# Patient Record
Sex: Female | Born: 1935 | Race: White | Hispanic: No | Smoking: Current every day smoker
Health system: Southern US, Community
[De-identification: ages and names within clinical notes are randomized; demographics above are authoritative.]

## PROBLEM LIST (undated history)

## (undated) DIAGNOSIS — E785 Hyperlipidemia, unspecified: Secondary | ICD-10-CM

## (undated) DIAGNOSIS — F419 Anxiety disorder, unspecified: Secondary | ICD-10-CM

## (undated) DIAGNOSIS — R911 Solitary pulmonary nodule: Secondary | ICD-10-CM

## (undated) DIAGNOSIS — C3490 Malignant neoplasm of unspecified part of unspecified bronchus or lung: Secondary | ICD-10-CM

## (undated) DIAGNOSIS — I10 Essential (primary) hypertension: Secondary | ICD-10-CM

## (undated) DIAGNOSIS — I509 Heart failure, unspecified: Secondary | ICD-10-CM

## (undated) DIAGNOSIS — I714 Abdominal aortic aneurysm, without rupture, unspecified: Secondary | ICD-10-CM

## (undated) DIAGNOSIS — K219 Gastro-esophageal reflux disease without esophagitis: Secondary | ICD-10-CM

## (undated) DIAGNOSIS — E039 Hypothyroidism, unspecified: Secondary | ICD-10-CM

## (undated) DIAGNOSIS — C801 Malignant (primary) neoplasm, unspecified: Secondary | ICD-10-CM

## (undated) DIAGNOSIS — D649 Anemia, unspecified: Secondary | ICD-10-CM

## (undated) DIAGNOSIS — F329 Major depressive disorder, single episode, unspecified: Secondary | ICD-10-CM

## (undated) DIAGNOSIS — I2510 Atherosclerotic heart disease of native coronary artery without angina pectoris: Secondary | ICD-10-CM

## (undated) DIAGNOSIS — K224 Dyskinesia of esophagus: Secondary | ICD-10-CM

## (undated) DIAGNOSIS — Z90710 Acquired absence of both cervix and uterus: Secondary | ICD-10-CM

## (undated) DIAGNOSIS — J449 Chronic obstructive pulmonary disease, unspecified: Secondary | ICD-10-CM

## (undated) DIAGNOSIS — I219 Acute myocardial infarction, unspecified: Secondary | ICD-10-CM

## (undated) DIAGNOSIS — F32A Depression, unspecified: Secondary | ICD-10-CM

## (undated) DIAGNOSIS — Z720 Tobacco use: Secondary | ICD-10-CM

## (undated) HISTORY — DX: Abdominal aortic aneurysm, without rupture, unspecified: I71.40

## (undated) HISTORY — DX: Anxiety disorder, unspecified: F41.9

## (undated) HISTORY — DX: Dyskinesia of esophagus: K22.4

## (undated) HISTORY — PX: CARDIAC CATHETERIZATION: SHX172

## (undated) HISTORY — PX: OTHER SURGICAL HISTORY: SHX169

## (undated) HISTORY — DX: Abdominal aortic aneurysm, without rupture: I71.4

## (undated) HISTORY — DX: Hyperlipidemia, unspecified: E78.5

## (undated) HISTORY — PX: BLADDER SURGERY: SHX569

## (undated) HISTORY — DX: Gastro-esophageal reflux disease without esophagitis: K21.9

## (undated) HISTORY — DX: Major depressive disorder, single episode, unspecified: F32.9

## (undated) HISTORY — DX: Acquired absence of both cervix and uterus: Z90.710

## (undated) HISTORY — DX: Tobacco use: Z72.0

## (undated) HISTORY — DX: Solitary pulmonary nodule: R91.1

## (undated) HISTORY — DX: Essential (primary) hypertension: I10

## (undated) HISTORY — DX: Atherosclerotic heart disease of native coronary artery without angina pectoris: I25.10

## (undated) HISTORY — PX: KIDNEY STONE SURGERY: SHX686

## (undated) HISTORY — PX: ABDOMINAL HYSTERECTOMY: SHX81

## (undated) HISTORY — PX: CHOLECYSTECTOMY: SHX55

## (undated) HISTORY — DX: Chronic obstructive pulmonary disease, unspecified: J44.9

## (undated) HISTORY — DX: Depression, unspecified: F32.A

## (undated) HISTORY — PX: NECK SURGERY: SHX720

## (undated) HISTORY — DX: Anemia, unspecified: D64.9

## (undated) HISTORY — DX: Hypothyroidism, unspecified: E03.9

---

## 2002-11-15 HISTORY — PX: CORONARY STENT PLACEMENT: SHX1402

## 2006-11-15 HISTORY — PX: CORONARY STENT PLACEMENT: SHX1402

## 2007-03-16 HISTORY — PX: ESOPHAGOGASTRODUODENOSCOPY: SHX1529

## 2007-03-22 ENCOUNTER — Ambulatory Visit: Payer: Self-pay | Admitting: Gastroenterology

## 2007-03-22 ENCOUNTER — Inpatient Hospital Stay (HOSPITAL_COMMUNITY): Admission: AD | Admit: 2007-03-22 | Discharge: 2007-03-24 | Payer: Self-pay | Admitting: Internal Medicine

## 2007-03-23 ENCOUNTER — Encounter (INDEPENDENT_AMBULATORY_CARE_PROVIDER_SITE_OTHER): Payer: Self-pay | Admitting: Specialist

## 2007-03-23 ENCOUNTER — Ambulatory Visit: Payer: Self-pay | Admitting: Gastroenterology

## 2007-03-24 ENCOUNTER — Ambulatory Visit: Payer: Self-pay | Admitting: Gastroenterology

## 2007-04-06 ENCOUNTER — Ambulatory Visit: Payer: Self-pay | Admitting: Gastroenterology

## 2007-04-06 ENCOUNTER — Ambulatory Visit (HOSPITAL_COMMUNITY): Admission: RE | Admit: 2007-04-06 | Discharge: 2007-04-06 | Payer: Self-pay | Admitting: Gastroenterology

## 2007-04-06 ENCOUNTER — Encounter: Payer: Self-pay | Admitting: Gastroenterology

## 2007-04-28 ENCOUNTER — Ambulatory Visit (HOSPITAL_COMMUNITY): Admission: RE | Admit: 2007-04-28 | Discharge: 2007-04-28 | Payer: Self-pay | Admitting: Urology

## 2007-06-16 ENCOUNTER — Ambulatory Visit (HOSPITAL_COMMUNITY): Admission: RE | Admit: 2007-06-16 | Discharge: 2007-06-16 | Payer: Self-pay | Admitting: Internal Medicine

## 2007-06-23 ENCOUNTER — Ambulatory Visit: Payer: Self-pay | Admitting: Cardiology

## 2007-06-23 ENCOUNTER — Inpatient Hospital Stay (HOSPITAL_COMMUNITY): Admission: AD | Admit: 2007-06-23 | Discharge: 2007-06-27 | Payer: Self-pay | Admitting: Cardiology

## 2007-06-29 ENCOUNTER — Ambulatory Visit: Payer: Self-pay | Admitting: Cardiology

## 2007-06-29 ENCOUNTER — Inpatient Hospital Stay (HOSPITAL_COMMUNITY): Admission: EM | Admit: 2007-06-29 | Discharge: 2007-07-02 | Payer: Self-pay | Admitting: Emergency Medicine

## 2007-07-07 ENCOUNTER — Ambulatory Visit: Payer: Self-pay | Admitting: Cardiology

## 2007-07-07 LAB — CONVERTED CEMR LAB
BUN: 26 mg/dL — ABNORMAL HIGH (ref 6–23)
Creatinine, Ser: 1.1 mg/dL (ref 0.4–1.2)
GFR calc non Af Amer: 51 mL/min
Sodium: 139 meq/L (ref 135–145)

## 2007-07-18 ENCOUNTER — Encounter (HOSPITAL_COMMUNITY): Admission: RE | Admit: 2007-07-18 | Discharge: 2007-08-15 | Payer: Self-pay | Admitting: Cardiology

## 2007-07-30 ENCOUNTER — Ambulatory Visit: Payer: Self-pay | Admitting: Pulmonary Disease

## 2007-07-30 ENCOUNTER — Inpatient Hospital Stay (HOSPITAL_COMMUNITY): Admission: EM | Admit: 2007-07-30 | Discharge: 2007-07-31 | Payer: Self-pay | Admitting: Emergency Medicine

## 2007-07-30 ENCOUNTER — Ambulatory Visit: Payer: Self-pay | Admitting: Cardiology

## 2007-08-04 ENCOUNTER — Ambulatory Visit: Payer: Self-pay | Admitting: Pulmonary Disease

## 2007-08-11 ENCOUNTER — Ambulatory Visit (HOSPITAL_COMMUNITY): Admission: RE | Admit: 2007-08-11 | Discharge: 2007-08-11 | Payer: Self-pay | Admitting: Pulmonary Disease

## 2007-08-16 ENCOUNTER — Encounter (HOSPITAL_COMMUNITY): Admission: RE | Admit: 2007-08-16 | Discharge: 2007-09-15 | Payer: Self-pay | Admitting: Cardiology

## 2007-08-18 ENCOUNTER — Ambulatory Visit: Payer: Self-pay | Admitting: Pulmonary Disease

## 2007-09-06 ENCOUNTER — Ambulatory Visit: Payer: Self-pay | Admitting: Cardiology

## 2007-09-12 ENCOUNTER — Emergency Department (HOSPITAL_COMMUNITY): Admission: EM | Admit: 2007-09-12 | Discharge: 2007-09-13 | Payer: Self-pay | Admitting: Emergency Medicine

## 2007-10-10 ENCOUNTER — Emergency Department (HOSPITAL_COMMUNITY): Admission: EM | Admit: 2007-10-10 | Discharge: 2007-10-10 | Payer: Self-pay | Admitting: Emergency Medicine

## 2007-11-16 HISTORY — PX: OTHER SURGICAL HISTORY: SHX169

## 2007-11-22 ENCOUNTER — Emergency Department (HOSPITAL_COMMUNITY): Admission: EM | Admit: 2007-11-22 | Discharge: 2007-11-22 | Payer: Self-pay | Admitting: Emergency Medicine

## 2008-06-18 ENCOUNTER — Ambulatory Visit: Payer: Self-pay | Admitting: Gastroenterology

## 2008-07-03 ENCOUNTER — Ambulatory Visit: Payer: Self-pay | Admitting: Cardiology

## 2008-07-15 ENCOUNTER — Encounter (HOSPITAL_COMMUNITY): Admission: RE | Admit: 2008-07-15 | Discharge: 2008-08-12 | Payer: Self-pay | Admitting: Gastroenterology

## 2008-07-16 HISTORY — PX: OTHER SURGICAL HISTORY: SHX169

## 2008-07-24 ENCOUNTER — Encounter: Payer: Self-pay | Admitting: Gastroenterology

## 2008-07-24 ENCOUNTER — Ambulatory Visit: Payer: Self-pay | Admitting: Gastroenterology

## 2008-07-24 ENCOUNTER — Ambulatory Visit (HOSPITAL_COMMUNITY): Admission: RE | Admit: 2008-07-24 | Discharge: 2008-07-24 | Payer: Self-pay | Admitting: Gastroenterology

## 2008-09-04 ENCOUNTER — Ambulatory Visit: Payer: Self-pay | Admitting: Gastroenterology

## 2008-09-04 ENCOUNTER — Ambulatory Visit (HOSPITAL_COMMUNITY): Admission: RE | Admit: 2008-09-04 | Discharge: 2008-09-04 | Payer: Self-pay | Admitting: Gastroenterology

## 2009-01-18 ENCOUNTER — Ambulatory Visit (HOSPITAL_COMMUNITY): Admission: RE | Admit: 2009-01-18 | Discharge: 2009-01-18 | Payer: Self-pay | Admitting: Internal Medicine

## 2009-05-08 DIAGNOSIS — J449 Chronic obstructive pulmonary disease, unspecified: Secondary | ICD-10-CM

## 2009-05-08 DIAGNOSIS — E785 Hyperlipidemia, unspecified: Secondary | ICD-10-CM

## 2009-05-08 DIAGNOSIS — J4489 Other specified chronic obstructive pulmonary disease: Secondary | ICD-10-CM | POA: Insufficient documentation

## 2009-05-08 DIAGNOSIS — I10 Essential (primary) hypertension: Secondary | ICD-10-CM

## 2009-05-08 DIAGNOSIS — I2510 Atherosclerotic heart disease of native coronary artery without angina pectoris: Secondary | ICD-10-CM | POA: Insufficient documentation

## 2009-05-09 ENCOUNTER — Ambulatory Visit: Payer: Self-pay | Admitting: Cardiology

## 2009-05-14 ENCOUNTER — Telehealth (INDEPENDENT_AMBULATORY_CARE_PROVIDER_SITE_OTHER): Payer: Self-pay

## 2009-05-15 ENCOUNTER — Encounter: Payer: Self-pay | Admitting: Internal Medicine

## 2009-05-15 ENCOUNTER — Ambulatory Visit: Payer: Self-pay

## 2009-05-20 ENCOUNTER — Ambulatory Visit: Payer: Self-pay | Admitting: Cardiology

## 2009-05-20 LAB — CONVERTED CEMR LAB
Basophils Absolute: 0 10*3/uL (ref 0.0–0.1)
Calcium: 8.7 mg/dL (ref 8.4–10.5)
Chloride: 105 meq/L (ref 96–112)
Eosinophils Absolute: 0.1 10*3/uL (ref 0.0–0.7)
Eosinophils Relative: 1.9 % (ref 0.0–5.0)
GFR calc non Af Amer: 56.87 mL/min (ref >60)
Hemoglobin: 11.4 g/dL — ABNORMAL LOW (ref 12.0–15.0)
INR: 1 (ref 0.8–1.0)
Lymphs Abs: 1.7 10*3/uL (ref 0.7–4.0)
MCV: 89.7 fL (ref 78.0–100.0)
Monocytes Absolute: 0.6 10*3/uL (ref 0.1–1.0)
Neutrophils Relative %: 67.3 % (ref 43.0–77.0)
Platelets: 226 10*3/uL (ref 150.0–400.0)
Potassium: 4 meq/L (ref 3.5–5.1)
RBC: 3.76 M/uL — ABNORMAL LOW (ref 3.87–5.11)
RDW: 13.6 % (ref 11.5–14.6)
Sodium: 141 meq/L (ref 135–145)
WBC: 7.6 10*3/uL (ref 4.5–10.5)

## 2009-05-21 ENCOUNTER — Ambulatory Visit: Payer: Self-pay | Admitting: Cardiology

## 2009-05-21 ENCOUNTER — Inpatient Hospital Stay (HOSPITAL_BASED_OUTPATIENT_CLINIC_OR_DEPARTMENT_OTHER): Admission: RE | Admit: 2009-05-21 | Discharge: 2009-05-21 | Payer: Self-pay | Admitting: Cardiology

## 2009-06-01 DIAGNOSIS — R079 Chest pain, unspecified: Secondary | ICD-10-CM

## 2009-06-02 ENCOUNTER — Ambulatory Visit: Payer: Self-pay | Admitting: Cardiology

## 2009-06-02 LAB — CONVERTED CEMR LAB
Basophils Absolute: 0 10*3/uL (ref 0.0–0.1)
Basophils Relative: 0.5 % (ref 0.0–3.0)
CK-MB: 1.2 ng/mL (ref 0.3–4.0)
Eosinophils Relative: 1.7 % (ref 0.0–5.0)
Hemoglobin: 12 g/dL (ref 12.0–15.0)
Lipase: 11 units/L (ref 11.0–59.0)
Lymphs Abs: 2 10*3/uL (ref 0.7–4.0)
MCV: 91.7 fL (ref 78.0–100.0)
Monocytes Absolute: 0.6 10*3/uL (ref 0.1–1.0)
Neutro Abs: 6.2 10*3/uL (ref 1.4–7.7)
Neutrophils Relative %: 68.9 % (ref 43.0–77.0)
RBC: 3.91 M/uL (ref 3.87–5.11)
Total Bilirubin: 0.5 mg/dL (ref 0.3–1.2)
Total CK: 59 units/L (ref 7–177)
Total Protein: 7.1 g/dL (ref 6.0–8.3)
WBC: 9 10*3/uL (ref 4.5–10.5)

## 2009-06-12 ENCOUNTER — Telehealth (INDEPENDENT_AMBULATORY_CARE_PROVIDER_SITE_OTHER): Payer: Self-pay

## 2009-06-13 ENCOUNTER — Encounter: Payer: Self-pay | Admitting: Gastroenterology

## 2009-06-23 ENCOUNTER — Telehealth: Payer: Self-pay | Admitting: Cardiology

## 2009-06-24 ENCOUNTER — Telehealth: Payer: Self-pay | Admitting: Cardiology

## 2009-06-26 ENCOUNTER — Ambulatory Visit: Payer: Self-pay | Admitting: Gastroenterology

## 2009-06-26 DIAGNOSIS — K219 Gastro-esophageal reflux disease without esophagitis: Secondary | ICD-10-CM

## 2009-06-26 DIAGNOSIS — K648 Other hemorrhoids: Secondary | ICD-10-CM | POA: Insufficient documentation

## 2009-06-26 DIAGNOSIS — K3184 Gastroparesis: Secondary | ICD-10-CM

## 2009-06-30 DIAGNOSIS — K625 Hemorrhage of anus and rectum: Secondary | ICD-10-CM

## 2009-06-30 DIAGNOSIS — R197 Diarrhea, unspecified: Secondary | ICD-10-CM | POA: Insufficient documentation

## 2009-07-04 ENCOUNTER — Telehealth: Payer: Self-pay | Admitting: Cardiology

## 2009-07-05 IMAGING — CR DG CHEST 2V
2 series · 2 of 2 positions shown · non-contrast
Comparison: none

HISTORY: Dyspnea, cough, pleuritic left chest pain, COPD, smoker

CHEST 2 VIEWS:
Comparison 03/22/2007
Normal heart size and pulmonary vascularity.
Mildly tortuous aorta with atherosclerotic calcification at arch.
Changes of COPD and mild chronic bronchitis.
No infiltrate, pleural effusion, or mass.
Bones unremarkable.

[view not recorded (1 of 2)]
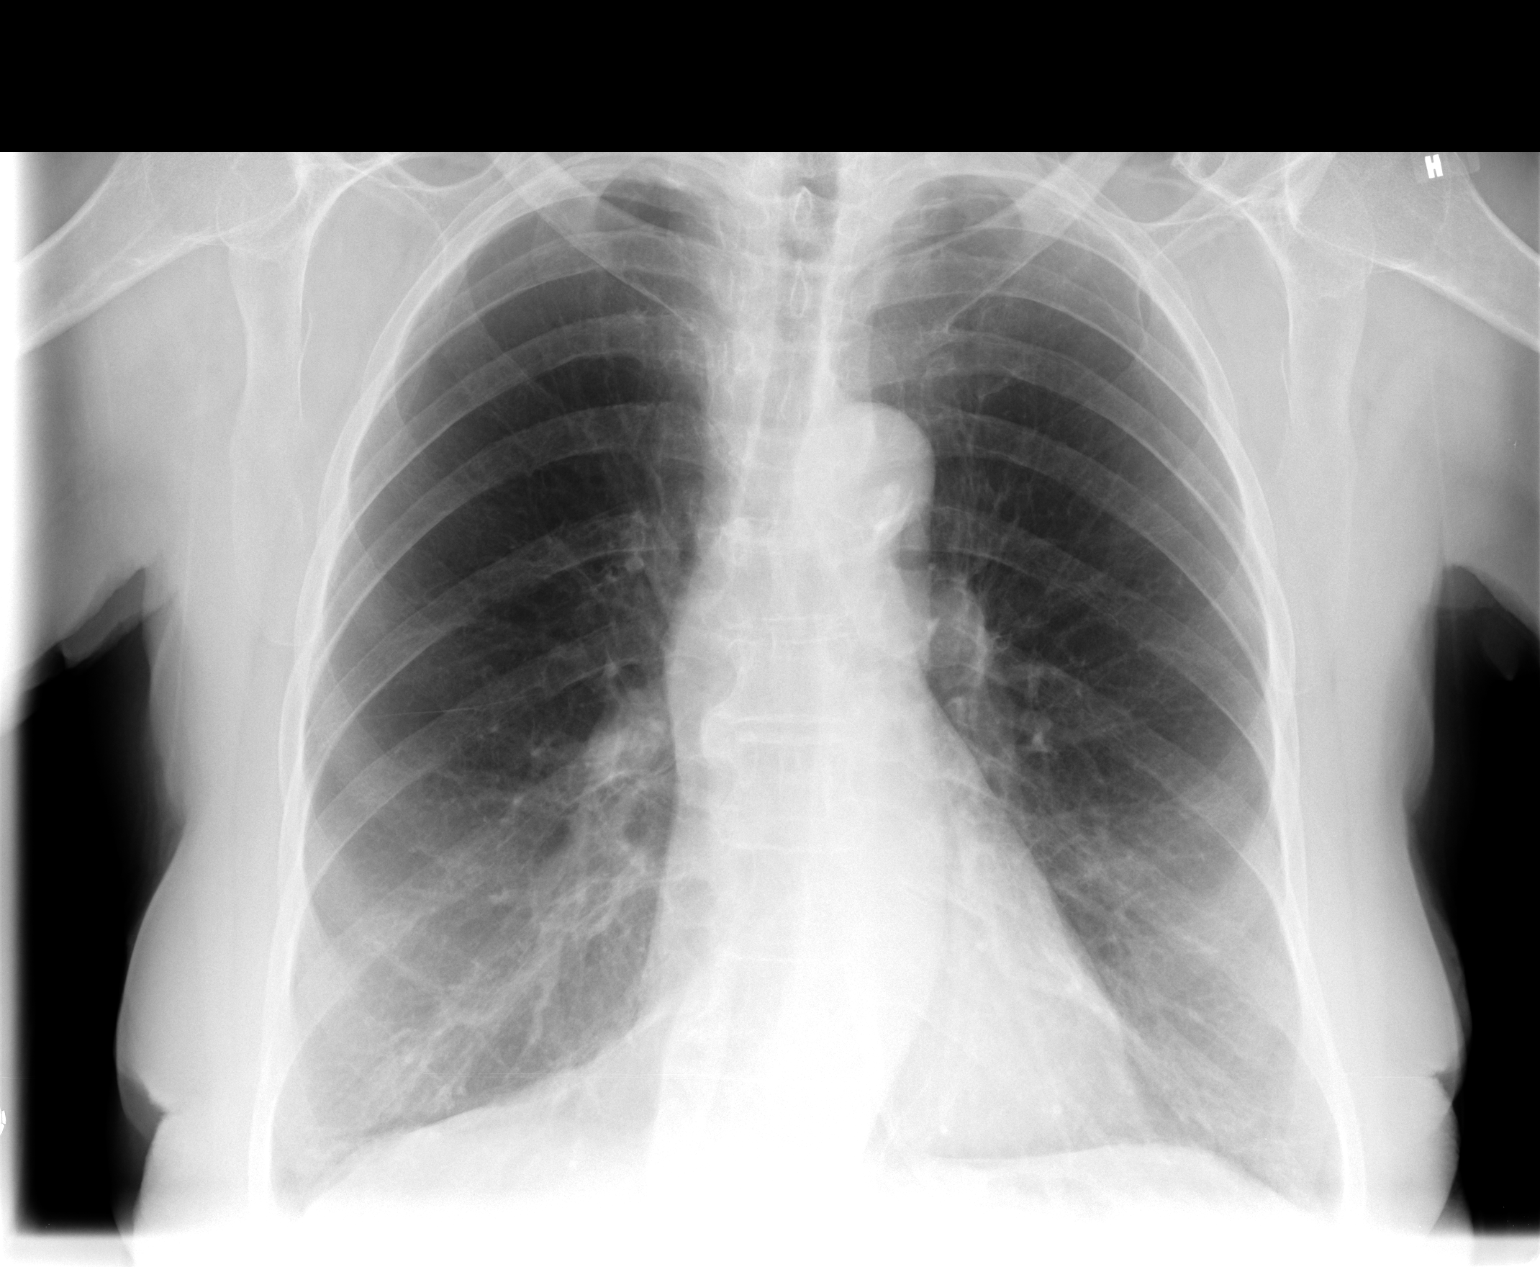

[view not recorded (2 of 2)]
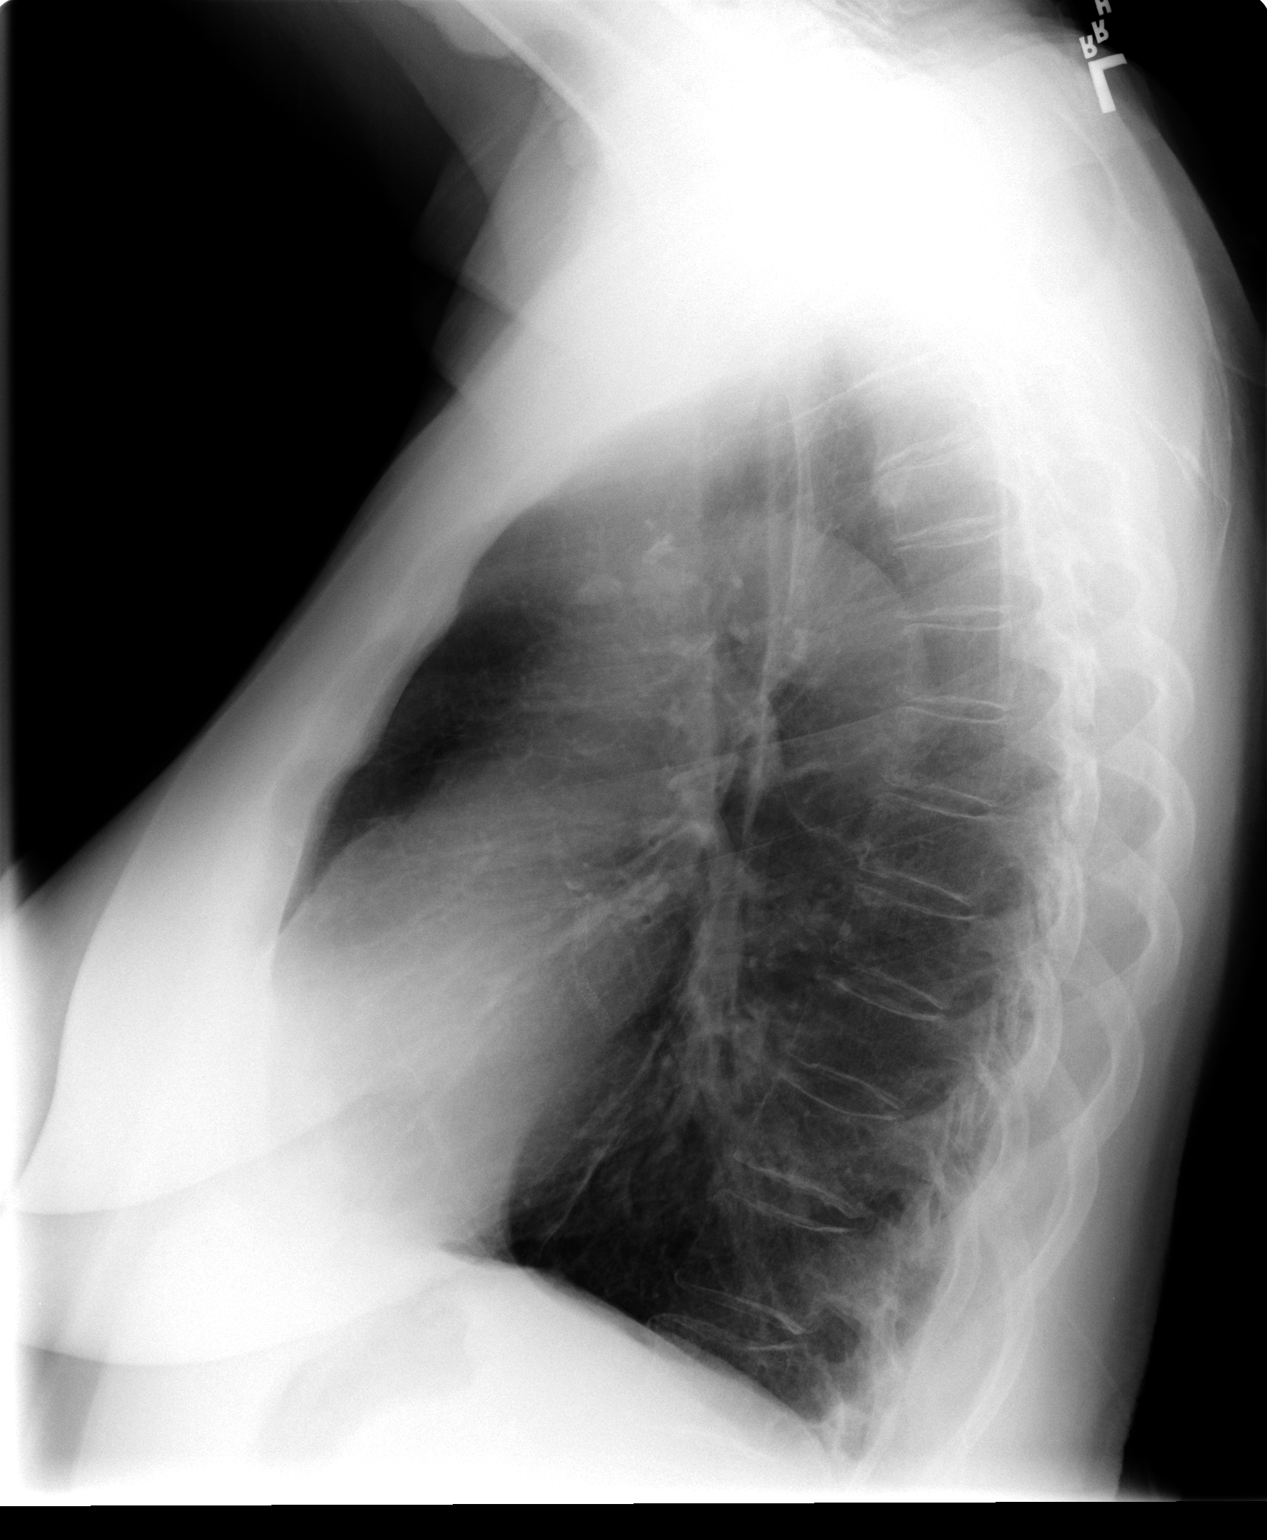

[2 of 2 positions shown; findings below may reference images not displayed]

IMPRESSION: COPD and mild chronic bronchitic changes.
No acute abnormalities.

## 2009-07-11 ENCOUNTER — Ambulatory Visit (HOSPITAL_COMMUNITY): Admission: RE | Admit: 2009-07-11 | Discharge: 2009-07-11 | Payer: Self-pay | Admitting: Gastroenterology

## 2009-07-14 ENCOUNTER — Encounter: Payer: Self-pay | Admitting: Cardiology

## 2009-07-18 ENCOUNTER — Ambulatory Visit: Payer: Self-pay | Admitting: Cardiology

## 2009-08-26 ENCOUNTER — Telehealth: Payer: Self-pay | Admitting: Cardiology

## 2009-10-01 ENCOUNTER — Ambulatory Visit: Payer: Self-pay | Admitting: Orthopedic Surgery

## 2009-10-01 DIAGNOSIS — M11269 Other chondrocalcinosis, unspecified knee: Secondary | ICD-10-CM | POA: Insufficient documentation

## 2009-10-01 DIAGNOSIS — M11869 Other specified crystal arthropathies, unspecified knee: Secondary | ICD-10-CM

## 2010-01-06 ENCOUNTER — Emergency Department (HOSPITAL_COMMUNITY): Admission: EM | Admit: 2010-01-06 | Discharge: 2010-01-06 | Payer: Self-pay | Admitting: Emergency Medicine

## 2010-01-21 ENCOUNTER — Telehealth (INDEPENDENT_AMBULATORY_CARE_PROVIDER_SITE_OTHER): Payer: Self-pay

## 2010-01-29 ENCOUNTER — Ambulatory Visit: Payer: Self-pay | Admitting: Orthopedic Surgery

## 2010-01-29 DIAGNOSIS — M5430 Sciatica, unspecified side: Secondary | ICD-10-CM

## 2010-04-27 ENCOUNTER — Ambulatory Visit: Payer: Self-pay | Admitting: Orthopedic Surgery

## 2010-04-27 DIAGNOSIS — M48061 Spinal stenosis, lumbar region without neurogenic claudication: Secondary | ICD-10-CM | POA: Insufficient documentation

## 2010-04-28 ENCOUNTER — Telehealth: Payer: Self-pay | Admitting: Orthopedic Surgery

## 2010-05-04 ENCOUNTER — Ambulatory Visit (HOSPITAL_COMMUNITY): Admission: RE | Admit: 2010-05-04 | Discharge: 2010-05-04 | Payer: Self-pay | Admitting: Orthopedic Surgery

## 2010-05-05 ENCOUNTER — Telehealth: Payer: Self-pay | Admitting: Orthopedic Surgery

## 2010-05-05 ENCOUNTER — Encounter: Payer: Self-pay | Admitting: Gastroenterology

## 2010-05-05 ENCOUNTER — Ambulatory Visit: Payer: Self-pay | Admitting: Gastroenterology

## 2010-05-05 DIAGNOSIS — D649 Anemia, unspecified: Secondary | ICD-10-CM

## 2010-05-05 DIAGNOSIS — Z8719 Personal history of other diseases of the digestive system: Secondary | ICD-10-CM

## 2010-05-05 DIAGNOSIS — R1310 Dysphagia, unspecified: Secondary | ICD-10-CM | POA: Insufficient documentation

## 2010-05-06 LAB — CONVERTED CEMR LAB
Eosinophils Absolute: 0.3 10*3/uL (ref 0.0–0.7)
Eosinophils Relative: 4 % (ref 0–5)
HCT: 33.2 % — ABNORMAL LOW (ref 36.0–46.0)
Lymphocytes Relative: 26 % (ref 12–46)
Lymphs Abs: 2 10*3/uL (ref 0.7–4.0)
Monocytes Absolute: 0.5 10*3/uL (ref 0.1–1.0)
Neutrophils Relative %: 64 % (ref 43–77)
RBC: 3.6 M/uL — ABNORMAL LOW (ref 3.87–5.11)
WBC: 7.7 10*3/uL (ref 4.0–10.5)

## 2010-05-13 ENCOUNTER — Telehealth (INDEPENDENT_AMBULATORY_CARE_PROVIDER_SITE_OTHER): Payer: Self-pay

## 2010-05-13 ENCOUNTER — Telehealth: Payer: Self-pay | Admitting: Cardiology

## 2010-05-14 ENCOUNTER — Encounter: Payer: Self-pay | Admitting: Gastroenterology

## 2010-05-15 HISTORY — PX: COLONOSCOPY: SHX174

## 2010-05-15 HISTORY — PX: ESOPHAGOGASTRODUODENOSCOPY: SHX1529

## 2010-05-19 ENCOUNTER — Telehealth: Payer: Self-pay | Admitting: Orthopedic Surgery

## 2010-05-19 ENCOUNTER — Telehealth: Payer: Self-pay | Admitting: Cardiology

## 2010-05-27 ENCOUNTER — Encounter: Payer: Self-pay | Admitting: Orthopedic Surgery

## 2010-06-01 ENCOUNTER — Ambulatory Visit: Payer: Self-pay | Admitting: Gastroenterology

## 2010-06-01 ENCOUNTER — Telehealth: Payer: Self-pay | Admitting: Gastroenterology

## 2010-06-01 ENCOUNTER — Ambulatory Visit (HOSPITAL_COMMUNITY): Admission: RE | Admit: 2010-06-01 | Discharge: 2010-06-01 | Payer: Self-pay | Admitting: Gastroenterology

## 2010-06-04 ENCOUNTER — Ambulatory Visit: Payer: Self-pay | Admitting: Gastroenterology

## 2010-06-04 ENCOUNTER — Ambulatory Visit (HOSPITAL_COMMUNITY): Admission: RE | Admit: 2010-06-04 | Discharge: 2010-06-04 | Payer: Self-pay | Admitting: Gastroenterology

## 2010-06-10 ENCOUNTER — Encounter: Payer: Self-pay | Admitting: Internal Medicine

## 2010-09-10 ENCOUNTER — Ambulatory Visit: Payer: Self-pay | Admitting: Cardiology

## 2010-09-10 ENCOUNTER — Encounter: Payer: Self-pay | Admitting: Cardiology

## 2010-09-11 LAB — CONVERTED CEMR LAB
BUN: 13 mg/dL (ref 6–23)
CO2: 29 meq/L (ref 19–32)
Calcium: 8.6 mg/dL (ref 8.4–10.5)
Chloride: 100 meq/L (ref 96–112)
Creatinine, Ser: 1.2 mg/dL (ref 0.4–1.2)
GFR calc non Af Amer: 45.48 mL/min (ref >60)
Glucose, Bld: 86 mg/dL (ref 70–99)
Potassium: 5 meq/L (ref 3.5–5.1)
Sodium: 134 meq/L — ABNORMAL LOW (ref 135–145)
TSH: 3.57 microintl units/mL (ref 0.35–5.50)

## 2010-09-18 ENCOUNTER — Ambulatory Visit: Payer: Self-pay | Admitting: Cardiology

## 2010-12-15 NOTE — Assessment & Plan Note (Signed)
Summary: rt knee pain needs xr/medicare/medicaid/hall/bsf   Visit Type:  Follow-up Referring Provider:  Dr. Dwana Melena Primary Provider:  Margo Aye, M.D.  CC:  right knee pain.  History of Present Illness: Madison Henson is 76 years old she received injection for chondrocalcinosis Dr. Lavetta Nielsen asked me to see her again for RIGHT knee pain.  She complains of pain in the back of her thigh radiating down to the medial side of her LEFT ankle she also has some medial joint line pain.  She also has some radicular type symptoms in the RIGHT leg  Review of systems negative for other musculoskeletal problems.  She does have some heart disease which is stable.  Medications: no change, taking Vitamin D.    Current Medications (verified): 1)  Simvastatin 40 Mg Tabs (Simvastatin) .... Tqke One Tab At Bedtime 2)  Plavix 75 Mg Tabs (Clopidogrel Bisulfate) .... Take One Tablet By Mouth Daily 3)  Aspirin 81 Mg Tbec (Aspirin) .... Take One Tablet By Mouth Daily 4)  Lisinopril 20 Mg Tabs (Lisinopril) .... Take One Tablet By Mouth Daily 5)  Amlodipine Besylate 5 Mg Tabs (Amlodipine Besylate) .... Take One Tablet By Mouth Daily 6)  Janumet 50-500 Mg Tabs (Sitagliptin-Metformin Hcl) .... Take One Tablet By Mouth Twice Daily. 7)  Synthroid 112 Mcg Tabs (Levothyroxine Sodium) .... Take One Tablet By Mouth Once Daily. 8)  Cymbalta 60 Mg Cpep (Duloxetine Hcl) .... Take One Capsule Once Daily 9)  Proair Hfa 108 (90 Base) Mcg/act Aers (Albuterol Sulfate) .... As Needed 10)  Furosemide 20 Mg Tabs (Furosemide) .... As Needed 11)  Potassium Chloride Crys Cr 20 Meq Cr-Tabs (Potassium Chloride Crys Cr) .... As Needed 12)  Vitamin D (Ergocalciferol) 50000 Unit Caps (Ergocalciferol) .... Weekly 13)  Dexilant 60 Mg .Marland Kitchen.. 1 Once Daily 14)  Anusol-Hc 25 Mg Supp (Hydrocortisone Acetate) .Marland Kitchen.. 1 Pr Q12h For 10 Days 15)  Levsin/sl 0.125 Mg Subl (Hyoscyamine Sulfate) .Marland Kitchen.. 1-2 Sl With Onset of Abdominal Pain and Diarrhea. May Repeat  Every 4 Hours. Max 8 Per Day. 16)  Vicodin 5-500 Mg Tabs (Hydrocodone-Acetaminophen) .Marland Kitchen.. 1 By Mouth Q 4 As Needed 17)  Norco 5-325 Mg Tabs (Hydrocodone-Acetaminophen) .Marland Kitchen.. 1 By Mouth Q 4 As Needed Pain 18)  Neurontin 100 Mg Caps (Gabapentin) .Marland Kitchen.. 1 By Mouth Three Times A Day  Allergies (verified): 1)  ! Sulfa 2)  Reglan (Metoclopramide Hcl)  Past History:  Past Medical History: Last updated: 10/01/2009   1. Coronary artery disease status post diaphragmatic wall infarction     on June 23, 2007, due to stent thrombosis of a Cypher stent in the     right coronary treated with a new Promus stent with previous Cypher     stents in circumflex artery with non-obstr CAD at cath 05/2009. 2. Good left ventricular function. 3. Chronic obstructive pulmonary disease with continued cigarette use. 4. Hypertension. 5. Hyperlipidemia. 6. Diabetes WITH GASTROPARESIS-70% retention at 2 hours 7. Gastroesophageal reflux disease and dysphagia.  8. WORKUP FOR REFLUX: EGD/Bx 5/08-Nl esophagus, NO H. PYLORI, EGD/Bx/48 hr Bravo SEP 2009 ON NEXIUM bid-day 1 DMSTR Score: 0.7, day 2: DMSTR Score: 32, uncontrolled GERD, NO H. PYLORI thyroid depression anxiety  Past Surgical History: Last updated: 05/08/2009 Status post hysterectomy, cholecystectomy, kidney stone extraction,       bladder tack, EGD and colonoscopy as above, and prior neck surgery.   Family History: Last updated: 10/01/2009  Sister had colon cancer at age 62 or 41.  Daughter had   polyps.  Family history of prostate cancer and lung cancer and ovarian   cancer.  FH of Cancer:  Family History Coronary Heart Disease female < 65 Family History of Arthritis Hx, family, chronic respiratory condition  Social History: Last updated: 10/01/2009 She is widowed.  She lives with her daughter.  She   never drank any alcohol.  She smokes a pack of cigarettes daily. 2 cups of caffeine daily     Review of Systems Neurologic:  Denies numbness and  tingling. Musculoskeletal:  See HPI.  Physical Exam  Additional Exam:  RIGHT knee and leg examination revealed that there is tenderness in the back of her thigh and calf radiating down to the LEFT ankle area.  She has medial joint line tenderness no joint effusion bursa is swollen.  Knee is stable.  Meniscal signs are negative.  Motor exam is normal.  She has normal sensation in that leg her pulses are excellent.  Her skin is dry.  She is awake and alert and oriented x3 mood and affect are normal.     Impression & Recommendations:  Problem # 1:  CHONDROCALCINOS DUE PYROPHOSHATE CRYSTLS LOW LEG (ICD-712.26) Assessment Deteriorated  Injection RIGHT knee:  Verbal consent was obtained. The knee was prepped with alcohol and ethyl chloride. 1 cc of depomedrol 40mg /cc and 4 cc of lidocaine 1% was injected. there were no complications.  Orders: Est. Patient Level IV (16109) Joint Aspirate / Injection, Large (20610) Depo- Medrol 40mg  (J1030)  Problem # 2:  SCIATICA (ICD-724.3) Assessment: New  Her updated medication list for this problem includes:    Aspirin 81 Mg Tbec (Aspirin) .Marland Kitchen... Take one tablet by mouth daily    Vicodin 5-500 Mg Tabs (Hydrocodone-acetaminophen) .Marland Kitchen... 1 by mouth q 4 as needed    Norco 5-325 Mg Tabs (Hydrocodone-acetaminophen) .Marland Kitchen... 1 by mouth q 4 as needed pain  Orders: Est. Patient Level IV (60454)  Medications Added to Medication List This Visit: 1)  Norco 5-325 Mg Tabs (Hydrocodone-acetaminophen) .Marland Kitchen.. 1 by mouth q 4 as needed pain 2)  Neurontin 100 Mg Caps (Gabapentin) .Marland Kitchen.. 1 by mouth three times a day  Patient Instructions: 1)  return in 1 month  2)  take medication as prescribed  Prescriptions: NEURONTIN 100 MG CAPS (GABAPENTIN) 1 by mouth three times a day  #60 x 5   Entered and Authorized by:   Fuller Canada MD   Signed by:   Fuller Canada MD on 01/29/2010   Method used:   Print then Give to Patient   RxID:   [id number] NORCO 5-325 MG  TABS (HYDROCODONE-ACETAMINOPHEN) 1 by mouth q 4 as needed pain  #60 x 5   Entered and Authorized by:   Fuller Canada MD   Signed by:   Fuller Canada MD on 01/29/2010   Method used:   Print then Give to Patient   RxID:   [id number]

## 2010-12-15 NOTE — Progress Notes (Signed)
Summary: MRI appointment  Phone Note Outgoing Call   Call placed by: Waldon Reining,  April 28, 2010 11:59 AM Call placed to: Patient Action Taken: Appt scheduled Summary of Call: I called to give the patient her MRI appointment at [facility] on 05-01-10 at 9:30 am. Patient has Medicare and Medicaid, no precert is needed. Patient will be referred to Neurosurgeon when we get her results.

## 2010-12-15 NOTE — Letter (Signed)
Summary: Internal Other  Internal Other   Imported By: Peggyann Shoals 05/14/2010 14:06:27  _____________________________________________________________________  External Attachment:    Type:   Image     Comment:   External Document  Appended Document: Internal Other MAY 2011: HB 9.7 PLT 317 CR 1.29NL HFP TSH 3.111  Spoke with daughter. Continue ASA and Plavix.

## 2010-12-15 NOTE — Progress Notes (Signed)
Summary: phone note in reference to holding Plavix for procedure  Phone Note Call from Patient   Caller: Daughter Summary of Call: T/C from pt's daughter, Pecola Leisure, expressing concerns of pt holding Plavix for a procedure. She said Dr. Juanda Chance had said that she was to never skip a dose of the Plavix. I told her that the notes in chart said that pt would discuss with cardiologist,and that she had agreed to schedule the procedure. It is scheduled for 06/01/2010. I asked her if she would talk to cardiologist and let us know, and she said she would. Initial call taken by: Cloria Spring LPN,  May 13, 2010 2:37 PM     Appended Document: phone note in reference to holding Plavix for procedure Please call pt. She may continue ASA and Plavix.  Appended Document: phone note in reference to holding Plavix for procedure Pt's daughter informed.

## 2010-12-15 NOTE — Progress Notes (Signed)
Summary: Phone call// lots of burping  Phone Note Call from Patient   Caller: Patient Summary of Call: Pt called and said she has been having a problem for the last two weeks with burping. Said she can eat anything, oatmeal, bland things, it doesn't matter. She burps for 15-20 min after she takes in anything to drink or eat. Please advise. She said Dr. Margo Aye has her on Dexilant 60 mg once daily, but it doesn't help her burping. she is aware Dr. Darrick Penna is at the hospital, and it may be awhile before she gets a call back. Initial call taken by: Cloria Spring LPN,  January 21, 2010 11:00 AM     Appended Document: Phone call// lots of burping Please call pt. She needs to sStop smoking. Follow a Step II gastroparesis diet for the next week. Lose 10 lbs. Continue Dexilant. Eat 4-6 small meals a day. Elevate HOB. Avoid laying down for 3-4 hours after eating. Add Baclofen 10 mg 30 minutes prior to breakfast and lunch. OPV in 1 month, RE: gastroparesis, E:30 visit.  Appended Document: Phone call// lots of burping Pt informed. Rx called to [facility].

## 2010-12-15 NOTE — Assessment & Plan Note (Signed)
Summary: rectal bleeding- cdg   Visit Type:  Initial Consult Referring Provider:  Dwana Melena Primary Care Provider:  Dwana Melena  Chief Complaint:  rectal bleeding.  History of Present Illness: Patient is here for further evaluation of anemia, melena, brbpr, abd pain. She has had drop in Hgb from 10.7 in Feb to 9.7 in May. Last fall her Hgb was 12.  Passing both melena and fresh blood. Having some lower abd pain cramps everyday associated fresh blood. Symptoms for two months. Passing just blood at times, when passes blood, abd pain improves. No epigastric pain. No N/V. Having lot of heartburn and reflux still. Some problems taking pills. Swallowing problems with meats/breads too. Barium swallow last fall showed decreased esophageal peristalsis, no stricture or mass, barium tablet passed without difficulty, no hiatal hernia or reflux noted.   Last time she held Plavix/Asa for few days, she states she had MI.   Labs 04/10/10: H/H 9.7/31.6, MCV 91.6, PLT 317,000, Cre 1.29, BUN 15, LFTs normal, TSH 3.111, B12 >2000, Hgb A1C 6.1   Current Medications (verified): 1)  Simvastatin 40 Mg Tabs (Simvastatin) .... Tqke One Tab At Bedtime 2)  Plavix 75 Mg Tabs (Clopidogrel Bisulfate) .... Take One Tablet By Mouth Daily 3)  Aspirin 81 Mg Tbec (Aspirin) .... Take One Tablet By Mouth Daily 4)  Lisinopril 20 Mg Tabs (Lisinopril) .... Take One Tablet By Mouth Daily 5)  Amlodipine Besylate 5 Mg Tabs (Amlodipine Besylate) .... Take One Tablet By Mouth Daily 6)  Janumet 50-500 Mg Tabs (Sitagliptin-Metformin Hcl) .... Take One Tablet By Mouth Twice Daily. 7)  Synthroid 112 Mcg Tabs (Levothyroxine Sodium) .... Take One Tablet By Mouth Once Daily. 8)  Cymbalta 60 Mg Cpep (Duloxetine Hcl) .... Take One Capsule Once Daily 9)  Proair Hfa 108 (90 Base) Mcg/act Aers (Albuterol Sulfate) .... As Needed 10)  Furosemide 20 Mg Tabs (Furosemide) .... As Needed 11)  Potassium Chloride Crys Cr 20 Meq Cr-Tabs (Potassium Chloride  Crys Cr) .... As Needed 12)  Vitamin D (Ergocalciferol) 50000 Unit Caps (Ergocalciferol) .... Weekly 13)  Dexilant 60 Mg .Marland Kitchen.. 1 Once Daily 14)  Levsin/sl 0.125 Mg Subl (Hyoscyamine Sulfate) .Marland Kitchen.. 1-2 Sl With Onset of Abdominal Pain and Diarrhea. May Repeat Every 4 Hours. Max 8 Per Day. 15)  Vicodin 5-500 Mg Tabs (Hydrocodone-Acetaminophen) .Marland Kitchen.. 1 By Mouth Q 4 As Needed 16)  Norco 5-325 Mg Tabs (Hydrocodone-Acetaminophen) .Marland Kitchen.. 1 By Mouth Q 4 As Needed Pain 17)  Neurontin 100 Mg Caps (Gabapentin) .Marland Kitchen.. 1 By Mouth Three Times A Day 18)  Vitamin D .... Once Weekly  Allergies (verified): 1)  ! Sulfa 2)  Reglan (Metoclopramide Hcl)  Past History:  Past Medical History:  1. Coronary artery disease status post diaphragmatic wall infarction     on June 23, 2007, due to stent thrombosis of a Cypher stent in the     right coronary treated with a new Promus stent with previous Cypher     stents in circumflex artery with non-obstr CAD at cath 05/2009. 2. Good left ventricular function. 3. Chronic obstructive pulmonary disease with continued cigarette use. 4. Hypertension. 5. Hyperlipidemia. 6. Diabetes WITH GASTROPARESIS-70% retention at 2 hours 7. Gastroesophageal reflux disease and dysphagia.  8. WORKUP FOR REFLUX: EGD/Bx 5/08-Nl esophagus, NO H. PYLORI, EGD/Bx/48 hr Bravo SEP 2009 ON NEXIUM bid-day 1 DMSTR Score: 0.7, day 2: DMSTR Score: 32, uncontrolled GERD, NO H. PYLORI 9. Hydrogen Breath test, 2009 negative for SB bacterial overgrowth 10. TCS, 5/08-->tortuous sigmoid colon, sigmoid  tics, couple of tubular adenomas, internal hemorrhoids 11. SB capsule endoscopy, 5/08-->negative 12. Barium swallow, 2010, There is decreased esophageal peristalsis.  There is no stricture or esophageal mass.  A barium tablet passed into the stomach without delay.  There is no hiatal hernia or reflux. 13. COPD 14. Anxiety Disorder 15. Depression Hypothyroidism  Past Surgical History: Status post hysterectomy,  cholecystectomy, kidney stone extraction, cataract surgery      bladder tack, EGD and colonoscopy as above, and prior neck surgery.   Family History:  Sister had colon cancer at age 34 or 65.  Daughter had   colon cancer.  Family history of prostate cancer and lung cancer and ovarian  cancer.  FH of Cancer:  Family History Coronary Heart Disease female < 80 Family History of Arthritis Hx, family, chronic respiratory condition  Social History: She is widowed.  She lives with her daughter.  She   never drank any alcohol.  She smokes 3 cigarettes daily. 2 cups of caffeine daily     Review of Systems General:  Complains of fatigue and weakness; denies fever, chills, sweats, anorexia, and weight loss. Eyes:  Denies vision loss. ENT:  Complains of difficulty swallowing; denies nasal congestion, sore throat, and hoarseness. CV:  Denies chest pains, angina, palpitations, dyspnea on exertion, and peripheral edema. Resp:  Complains of wheezing; denies dyspnea at rest, dyspnea with exercise, cough, and sputum. GI:  See HPI. GU:  Denies urinary burning and blood in urine. MS:  Complains of joint pain / LOM. Derm:  Denies rash and itching. Neuro:  Denies weakness, paralysis, frequent headaches, memory loss, and confusion. Psych:  Denies depression and anxiety. Endo:  Denies unusual weight change. Heme:  Complains of bleeding; denies bruising. Allergy:  Denies hives and rash.  Vital Signs:  Patient profile:   77 year old female Height:      69 inches Weight:      205 pounds BMI:     30.38 Temp:     98.1 degrees F oral Pulse rate:   80 / minute BP sitting:   100 / 60  (left arm) Cuff size:   regular  Vitals Entered By: Cloria Spring LPN (May 05, 2010 8:30 AM)  Physical Exam  General:  Well developed, well nourished, no acute distress.obese.   Head:  Normocephalic and atraumatic. Eyes:  Conjunctivae pale, no scleral icterus.  Mouth:  Oropharyngeal mucosa moist, pink.  No lesions,  erythema or exudate.   Edentulous. ?tardive dyskinesia Neck:  Supple; no masses or thyromegaly. Lungs:  Wheezes bilaterally Heart:  Regular rate and rhythm; no murmurs, rubs,  or bruits. Abdomen:  Bowel sounds normal.  Abdomen is soft, nontender, nondistended.  No rebound or guarding.  No hepatosplenomegaly, masses or hernias.  No abdominal bruits.  Rectal:  deferred until time of colonoscopy.   Extremities:  No clubbing, cyanosis, edema or deformities noted. Neurologic:  Alert and  oriented x4;  grossly normal neurologically. Skin:  Intact without significant lesions or rashes. Cervical Nodes:  No significant cervical adenopathy. Psych:  Alert and cooperative. Normal mood and affect.  Impression & Recommendations:  Problem # 1:  ANEMIA, NORMOCYTIC (ICD-285.9)  With recent melena, hematochezia for past two months. Drop from normal Hgb last year to 10 in Feb, to 9 in May. She c/o feeling fatigued, weak. Denies increased SOB or orthostasis. She is on chronic Plavix/ASA due to h/o MI/CAD/stents. She is concerned about stopping these meds, because the last time she stopped for a few days  she had MI. Suspect we are dealing with lower GI bleed or proximal SB given hematochezia with hemodynamic stability. Will recheck CBC now. She will need TCS /EGD in near future. Will discuss with Dr. Darrick Penna regarding Plavix/ASA.   Orders: Consultation Level IV (16109)  Problem # 2:  DYSPHAGIA UNSPECIFIED (ICD-787.20)  Ongoing dysphagia, likely due to esophageal dysmotility as evidenced on BPE last year.   Orders: Consultation Level IV (60454)  Problem # 3:  GERD (ICD-530.81)  Persistent. Was not candidate for anti-relux surgery due to gastroparesis. EGD as planned.  Orders: Consultation Level IV (09811)  Other Orders: T-CBC w/Diff ([id number]) I would like to thank Dr. Dwana Melena for allowing [place] to take part in the care of this nice patient.  Appended Document: rectal bleeding- cdg Her rectal  bleeding is likely from internal hemorrhoids. the black stool may be coming from an ulcer. Her options are: 1. Stop PLavix and may continue ASA and have an EGD and TCS, 2.EGD only on ASA or Plavix, 3. She may elect not to have the procedure done if she is afraid she may have an MI. She has had 2 EGDs, 1 TCS, and a CE since 2008.   Appended Document: rectal bleeding- cdg Please let pt know, Dr. Darrick Penna recommends her hold her Plavix for TCS/EGD but she can continue ASA. Other option is to do nothing, but would be concerned about persistent bleeding. Her Hgb remains stable at 9.7.  She can discuss the above with her cardiologist if she would like. But Dr. Darrick Penna does not want to do EGD/TCS on both plavix and ASA.   Schedule when she decides.   Appended Document: rectal bleeding- cdg She will need to drop Janumet to 1/2 tablet two times a day the day of colon prep.  Appended Document: rectal bleeding- cdg pt aware, she stated it was ok to go ahead and set her up an appt.  Appended Document: rectal bleeding- cdg LL, Appears patient is ready to proceed.  From notes it appears that the plan is to stop Plavix and continue ASA, but several notations and multiple notes appended.  Please clarify anti-coags; understand orders for Janumet  Appended Document: rectal bleeding- cdg TCS/EGD with [facility]. Hold Plavix for 5 days. Continue ASA.   Appended Document: rectal bleeding- cdg TCS/EGD scheduled for 06/01/10- pt aware and instructions mailed- cdg

## 2010-12-15 NOTE — Progress Notes (Signed)
Summary: Plavix  ---- Converted from flag ---- ---- 05/14/2010 9:50 PM, [name], MD, [facility] wrote: Madison Henson, Pt is right.  She had prior very late ST.  Think it would be best to do colonoscopy on Plavix if possible.  Will need to communicate with GI doctor. BB  ---- 05/13/2010 5:53 PM, Sherri Rad, RN, BSN wrote: Please review 6/29 phone note ------------------------------  Phone Note Outgoing Call   Call placed by: Sherri Rad, RN, BSN,  May 19, 2010 12:36 PM Call placed to: Dr. Darrick Penna Summary of Call: I have left a message with Dr. Darrick Penna office that the pt should not hold her plavix prior to colonoscopy per Dr. Juanda Chance, secondary to the pt's history. Initial call taken by: Sherri Rad, RN, BSN,  May 19, 2010 12:37 PM  Follow-up for Phone Call        I received a call back from [place] with Dr. Darrick Penna. She states Dr. Darrick Penna will do the pt's procedure on both ASA and Plavix.  Follow-up by: Sherri Rad, RN, BSN,  May 20, 2010 1:13 PM

## 2010-12-15 NOTE — Progress Notes (Signed)
Summary: Neurosurgeon appointment  Phone Note Outgoing Call   Call placed by: Waldon Reining,  May 19, 2010 10:54 AM Call placed to: Patient Action Taken: Appt scheduled, Information Sent Summary of Call: I called to give the patient her appointment at [facility] to see Dr. Jeral Fruit on 05-29-10 at 10:50. Patient is aware to take a copy of her films.

## 2010-12-15 NOTE — Medication Information (Signed)
Summary: Tax adviser   Imported By: Cammie Sickle 04/28/2010 19:51:42  _____________________________________________________________________  External Attachment:    Type:   Image     Comment:   External Document

## 2010-12-15 NOTE — Progress Notes (Signed)
Summary: Please call patient with her MRI results.  Phone Note Call from Patient   Caller: Patient Call For: Dr. Romeo Apple Summary of Call: Patient called concerned about her MRI results. She would like you to call her with her results. I have already faxed her referral to the Neurosurgeon, waiting on them to call back. Initial call taken by: Waldon Reining,  May 05, 2010 2:03 PM  Follow-up for Phone Call        called Follow-up by: Fuller Canada MD,  May 11, 2010 12:39 PM

## 2010-12-15 NOTE — Progress Notes (Signed)
Summary: Referral to the Neurosurgeon.  Phone Note Outgoing Call   Call placed by: Waldon Reining,  May 05, 2010 10:18 AM Call placed to: Specialist Action Taken: Information Sent Summary of Call: I faxed a referral for this patient to [facility] to be seen for Spinal Stenosis of the L-spine.

## 2010-12-15 NOTE — Letter (Signed)
Summary: Notice of referral appointment cancellation  Notice of referral appointment cancellation   Imported By: Jacklynn Ganong 05/29/2010 09:16:23  _____________________________________________________________________  External Attachment:    Type:   Image     Comment:   External Document

## 2010-12-15 NOTE — Progress Notes (Signed)
Summary: questions re stopping plavix for colonoscopy  Phone Note Call from Patient   Caller: Daughter [phone] ms lovelace Reason for Call: Talk to Nurse Summary of Call: pt scheduled for colonoscopy on july 18th, was told she could take her aspirin but cannot take her plavix-dtr stated dr Juanda Chance told the pt to never miss a dose, and they want her to stop july 13th, so she will be w/o it for 5 days-is this alright? pls call [phone] Initial call taken by: Glynda Jaeger,  May 13, 2010 2:49 PM  Follow-up for Phone Call        I called and spoke with the pt. I made her aware I will need to discuss the above with Dr. Juanda Chance. The pt states she is quite concerned about holding her Plavix b/c she had a heart attack after holding it for 3 days before. I will review with Dr. Juanda Chance and call the pt. on friday. Follow-up by: Sherri Rad, RN, BSN,  May 13, 2010 5:30 PM  Additional Follow-up for Phone Call Additional follow up Details #1::        I have spoken with the pt and advised her that Dr. Juanda Chance does not want her to hold her plavix prior to her colonscopy. She is aware I have left a message with Dr. Darrick Penna office in this regards. Additional Follow-up by: Sherri Rad, RN, BSN,  May 19, 2010 12:36 PM

## 2010-12-15 NOTE — Letter (Signed)
Summary: tcs instructions  tcs instructions   Imported By: Hendricks Limes LPN 16/08/9603 54:09:81  _____________________________________________________________________  External Attachment:    Type:   Image     Comment:   External Document

## 2010-12-15 NOTE — Progress Notes (Signed)
Summary: TCS jul 21  Pt seen for TCS-poor prep. Repeat TCS-THUR-ONE HOUR SLOT  with TRILYTE PREP. Soft low residue diet MON, Full liquid TUES, clear liquid WED. West Bali MD  June 01, 2010 9:22 AM  Appended Document: TCS jul 21 pt rescheuled to 7/21 at 9am. Kim at [facility] aware, pts daughter came to office and picked up instructions. went over prep instructions with daughter and she stated she knew how to do it.

## 2010-12-15 NOTE — Assessment & Plan Note (Signed)
Summary: reck rt knee after inject/medicare/m,edicaid/bsf   Visit Type:  Follow-up Referring Provider:  Dr. Dwana Melena Primary Provider:  Margo Aye, M.D.  CC:  right knee.  History of Present Illness: Madison Henson had an injection in the RIGHT knee but she seems to complain more of lower back pain with weakness in the RIGHT leg and severe pain radiating down her RIGHT leg.  She says she can't stand in one place for long periods of time it feels like her leg will give out at any moment.   Medications: Norco 5-325 Mg Tabs (Hydrocodone-acetaminophen) .Marland Kitchen.. 1 by mouth q 4 as needed pain              Neurontin 100 Mg Caps (Gabapentin) .Marland Kitchen.. 1 by mouth three times a day    Allergies: 1)  ! Sulfa 2)  Reglan (Metoclopramide Hcl)  Past History:  Past Medical History: Last updated: 10/01/2009   1. Coronary artery disease status post diaphragmatic wall infarction     on June 23, 2007, due to stent thrombosis of a Cypher stent in the     right coronary treated with a new Promus stent with previous Cypher     stents in circumflex artery with non-obstr CAD at cath 05/2009. 2. Good left ventricular function. 3. Chronic obstructive pulmonary disease with continued cigarette use. 4. Hypertension. 5. Hyperlipidemia. 6. Diabetes WITH GASTROPARESIS-70% retention at 2 hours 7. Gastroesophageal reflux disease and dysphagia.  8. WORKUP FOR REFLUX: EGD/Bx 5/08-Nl esophagus, NO H. PYLORI, EGD/Bx/48 hr Bravo SEP 2009 ON NEXIUM bid-day 1 DMSTR Score: 0.7, day 2: DMSTR Score: 32, uncontrolled GERD, NO H. PYLORI thyroid depression anxiety  Past Surgical History: Last updated: 05/08/2009 Status post hysterectomy, cholecystectomy, kidney stone extraction,       bladder tack, EGD and colonoscopy as above, and prior neck surgery.   Family History: Last updated: 10/01/2009  Sister had colon cancer at age 11 or 73.  Daughter had   polyps.  Family history of prostate cancer and lung cancer and ovarian   cancer.  FH of Cancer:  Family History Coronary Heart Disease female < 38 Family History of Arthritis Hx, family, chronic respiratory condition  Social History: Last updated: 10/01/2009 She is widowed.  She lives with her daughter.  She   never drank any alcohol.  She smokes a pack of cigarettes daily. 2 cups of caffeine daily     Review of Systems Gastrointestinal:  Denies diarrhea and constipation. Genitourinary:  Denies frequency, urgency, and difficulty urinating. Neurologic:  Complains of numbness.  Physical Exam  Additional Exam:  Her overall appearance is normal and she is oriented x3 her mood and affect are normal gait and station are abnormal she favors her RIGHT leg  Inspection revealed that the RIGHT knee does have some tenderness no swelling no joint effusion functional range of motion.  No instability.  Normal strength.  Skin intact.  Patient does have pain in the RIGHT hip with tenderness there over the gluteal region with positive sitting straight leg raise.  Pulsatile temperature normal and the RIGHT lower extremity.  Deep sensation and normal reflexes.   Impression & Recommendations:  Problem # 1:  SPINAL STENOSIS, LUMBAR (ICD-724.02) Assessment Deteriorated  x-rays back L-spine severe coronal plane malalignment multilevel degenerative disc disease  Recommend MRI and then neurosurgical followup  Orders: Neurosurgeon Referral (Neurosurgeon) Est. Patient Level IV (84696)  Patient Instructions: 1)  MRI LSPINE  2)  REFER TO N SURGERY AFTER MRI  3)  NO return  NEEDED HERE Prescriptions: NORCO 5-325 MG TABS (HYDROCODONE-ACETAMINOPHEN) 1 by mouth q 4 as needed pain  #60 x 5   Entered and Authorized by:   Fuller Canada MD   Signed by:   Fuller Canada MD on 04/27/2010   Method used:   Print then Give to Patient   RxID:   [id number] NEURONTIN 100 MG CAPS (GABAPENTIN) 1 by mouth three times a day  #60 x 5   Entered and Authorized by:   Fuller Canada MD   Signed by:   Fuller Canada MD on 04/27/2010   Method used:   Print then Give to Patient   RxID:   [id number]

## 2010-12-15 NOTE — Assessment & Plan Note (Signed)
Summary: Z6X   Referring Jarl Sellitto:  Dwana Melena Primary Imer Foxworth:  Dwana Melena   History of Present Illness: Mrs. Madison Henson is 77 years old and return for management of CAD. 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 A new DES was also placed at that time. She previously had Cypher stent in the circumflex artery as well.  A catheterization in 05/2009 showed only nonobstructive disease. We did an event monitor and this showed only isolated PVCs and APCs.  She's had some sharp shooting pains in mid epigastrium that radiates to LLQ but pain does not sound like angina. she has had EGD recently which was negative. She has chronic shortness of breath related to her COPD. She has continued to smoke, she is also complaining of some tremors which have been ongoing for several months now. no other complaints.   Current Medications (verified): 1)  Simvastatin 40 Mg Tabs (Simvastatin) .... Tqke One Tab At Bedtime 2)  Plavix 75 Mg Tabs (Clopidogrel Bisulfate) .... Take One Tablet By Mouth Daily 3)  Aspirin 81 Mg Tbec (Aspirin) .... Take One Tablet By Mouth Daily 4)  Lisinopril 20 Mg Tabs (Lisinopril) .... Take One Tablet By Mouth Daily 5)  Amlodipine Besylate 5 Mg Tabs (Amlodipine Besylate) .... Take One Tablet By Mouth Daily 6)  Janumet 50-500 Mg Tabs (Sitagliptin-Metformin Hcl) .... Take One Tablet By Mouth Twice Daily. 7)  Synthroid 112 Mcg Tabs (Levothyroxine Sodium) .... Take One Tablet By Mouth Once Daily. 8)  Cymbalta 60 Mg Cpep (Duloxetine Hcl) .... Take One Capsule Once Daily 9)  Proair Hfa 108 (90 Base) Mcg/act Aers (Albuterol Sulfate) .... As Needed 10)  Furosemide 20 Mg Tabs (Furosemide) .... As Needed 11)  Potassium Chloride Crys Cr 20 Meq Cr-Tabs (Potassium Chloride Crys Cr) .... As Needed 12)  Vitamin D (Ergocalciferol) 50000 Unit Caps (Ergocalciferol) .... Weekly 13)  Dexilant 60 Mg .Marland Kitchen.. 1 Once Daily 14)  Levsin/sl 0.125 Mg  Subl (Hyoscyamine Sulfate) .Marland Kitchen.. 1-2 Sl With Onset of Abdominal Pain and Diarrhea. May Repeat Every 4 Hours. Max 8 Per Day. 15)  Vicodin 5-500 Mg Tabs (Hydrocodone-Acetaminophen) .Marland Kitchen.. 1 By Mouth Q 4 As Needed 16)  Neurontin 100 Mg Caps (Gabapentin) .Marland Kitchen.. 1 By Mouth Three Times A Day 17)  Vitamin D .... Once Weekly  Allergies (verified): 1)  ! Sulfa 2)  Reglan (Metoclopramide Hcl)  Review of Systems       negative except per HPI  Vital Signs:  Patient profile:   77 year old female Height:      69 inches Weight:      214.50 pounds Pulse rate:   73 / minute Pulse rhythm:   irregular BP sitting:   128 / 60 Cuff size:   large  Vitals Entered By: Sherri Rad, RN, BSN (September 10, 2010 9:49 AM)  Physical Exam  General:  Well developed, well nourished, no acute distress.obese.   Neck:  Supple; no masses or thyromegaly. Lungs:  bronchial brathing sounds bilaterally, no wheezing.  Heart:  Regular rate and rhythm; 2/6 SEM at L base.  Abdomen:  Bowel sounds normal.  Abdomen is soft, nontender, nondistended.  No rebound or guarding.  No hepatosplenomegaly, masses or hernias.  No abdominal bruits.  Msk:  Symmetrical with no gross deformities.  Extremities:  No clubbing, cyanosis,with only trace LE edema noted.  Neurologic:  non focal.   Impression & Recommendations:  Problem # 1:  CHEST PAIN-UNSPECIFIED (ICD-786.50) Assessment  Comment Only her does not appear to be anginal and could be related to reflux. We will get a will get BMET. continue anticoagulation. Pt does have a new LBBB today, however given her negative cath last year, and no typical anginal symptoms, will schedule for followup in 6 months.   Her updated medication list for this problem includes:    Plavix 75 Mg Tabs (Clopidogrel bisulfate) .Marland Kitchen... Take one tablet by mouth daily    Aspirin 81 Mg Tbec (Aspirin) .Marland Kitchen... Take one tablet by mouth daily    Lisinopril 20 Mg Tabs (Lisinopril) .Marland Kitchen... Take one tablet by mouth daily     Amlodipine Besylate 5 Mg Tabs (Amlodipine besylate) .Marland Kitchen... Take one tablet by mouth daily  Problem # 2:  CAD, NATIVE VESSEL (ICD-414.01) Assessment: Comment Only  new LBBB, will followup in 6 months. will also increase her lasix to 20mg  daily from as needed dosing.   Her updated medication list for this problem includes:    Plavix 75 Mg Tabs (Clopidogrel bisulfate) .Marland Kitchen... Take one tablet by mouth daily    Aspirin 81 Mg Tbec (Aspirin) .Marland Kitchen... Take one tablet by mouth daily    Lisinopril 20 Mg Tabs (Lisinopril) .Marland Kitchen... Take one tablet by mouth daily    Amlodipine Besylate 5 Mg Tabs (Amlodipine besylate) .Marland Kitchen... Take one tablet by mouth daily  Orders: EKG w/ Interpretation (93000) TLB-BMP (Basic Metabolic Panel-BMET) (80048-METABOL) TLB-TSH (Thyroid Stimulating Hormone) (84443-TSH)  Problem # 3:  HYPERTENSION, BENIGN (ICD-401.1) Assessment: Comment Only  Well controlled on current treatment, No new changes made today, Will continue to monitor.   Her updated medication list for this problem includes:    Aspirin 81 Mg Tbec (Aspirin) .Marland Kitchen... Take one tablet by mouth daily    Lisinopril 20 Mg Tabs (Lisinopril) .Marland Kitchen... Take one tablet by mouth daily    Amlodipine Besylate 5 Mg Tabs (Amlodipine besylate) .Marland Kitchen... Take one tablet by mouth daily    Furosemide 20 Mg Tabs (Furosemide) .Marland Kitchen... Take one tablet by mouth daily.  Orders: EKG w/ Interpretation (93000) TLB-BMP (Basic Metabolic Panel-BMET) (80048-METABOL) TLB-TSH (Thyroid Stimulating Hormone) (84443-TSH)  BP today: 128/60 Prior BP: 100/60 (05/05/2010)  Labs Reviewed: K+: 4.0 (05/20/2009) Creat: : 1.0 (05/20/2009)     Problem # 4:  COPD (ICD-496) Assessment: Comment Only stable.   Patient Instructions: 1)  Labwork today: bmet/tsh (414.01;402.10) 2)  Labwork in 1 week: bmet (414.01;402.10). 3)  Take lasix (furosemide) 20mg  once daily. 4)  Take potassium 20 meq once daily. 5)  Your physician wants you to follow-up in: 6 months with Dr.  Clifton James.  You will receive a reminder letter in the mail two months in advance. If you don't receive a letter, please call our office to schedule the follow-up appointment. Prescriptions: POTASSIUM CHLORIDE CRYS CR 20 MEQ CR-TABS (POTASSIUM CHLORIDE CRYS CR) take one tablet by mouth once daily  #30 x 11   Entered by:   Sherri Rad, RN, BSN   Authorized by:   [name], MD, [facility]   Signed by:   Sherri Rad, RN, BSN on 09/10/2010   Method used:   Faxed to ...       [facility]ail[facility]       [address]       [place], [place]  [address]       Ph: [phone] or [phone]       Fax: [phone]   RxID:   [id number] FUROSEMIDE 20 MG  TABS (FUROSEMIDE) Take one tablet by mouth daily.  #30 x 11   Entered by:   Sherri Rad, RN, BSN   Authorized by:   [name], MD, [facility]   Signed by:   Sherri Rad, RN, BSN on 09/10/2010   Method used:   Faxed to ...       Hospital doctor (retail)       [address]       [place], [place]  [address]       Ph: [phone] or [phone]       Fax: [phone]   RxID:   [id number]

## 2011-01-15 ENCOUNTER — Telehealth: Payer: Self-pay | Admitting: Orthopedic Surgery

## 2011-01-21 NOTE — Progress Notes (Addendum)
Summary: call from [facility]'s office  Phone Note From Other Clinic   Caller: Dr. Scharlene Gloss office Summary of Call:  Okey Regal from Dr. Keane Police office called regarding this mutual patient. She is confirming a referral "to another orthopedic surgeon."  She states that patient is scheduled to see Dr Ophelia Charter at [facility]   Okey Regal states she is aware that patient did not keep the neurosurgeon appointment at [facility] w/Dr.Nudelman.  I verified that our office did not make an orthopedic referral.  Okey Regal states that she did receive Dr.Evonne Rinks's last office note and MRI report from 2011 that our office had faxed at the time of the services. Initial call taken by: Cammie Sickle,  January 15, 2011 10:30 AM

## 2011-01-30 LAB — CBC
Hemoglobin: 9.9 g/dL — ABNORMAL LOW (ref 12.0–15.0)
MCH: 28.5 pg (ref 26.0–34.0)
Platelets: 245 10*3/uL (ref 150–400)
RBC: 3.49 MIL/uL — ABNORMAL LOW (ref 3.87–5.11)
WBC: 6.8 10*3/uL (ref 4.0–10.5)

## 2011-01-30 LAB — GLUCOSE, CAPILLARY
Glucose-Capillary: 105 mg/dL — ABNORMAL HIGH (ref 70–99)
Glucose-Capillary: 108 mg/dL — ABNORMAL HIGH (ref 70–99)

## 2011-01-30 LAB — FERRITIN: Ferritin: 11 ng/mL (ref 10–291)

## 2011-02-03 LAB — POCT I-STAT, CHEM 8
BUN: 11 mg/dL (ref 6–23)
Calcium, Ion: 1.11 mmol/L — ABNORMAL LOW (ref 1.12–1.32)
Creatinine, Ser: 1.1 mg/dL (ref 0.4–1.2)
Glucose, Bld: 102 mg/dL — ABNORMAL HIGH (ref 70–99)
TCO2: 25 mmol/L (ref 0–100)

## 2011-02-03 LAB — URINALYSIS, ROUTINE W REFLEX MICROSCOPIC
Bilirubin Urine: NEGATIVE
Hgb urine dipstick: NEGATIVE
Nitrite: NEGATIVE
Protein, ur: NEGATIVE mg/dL
Specific Gravity, Urine: 1.016 (ref 1.005–1.030)
Urobilinogen, UA: 0.2 mg/dL (ref 0.0–1.0)

## 2011-02-03 LAB — DIFFERENTIAL
Eosinophils Absolute: 0.2 10*3/uL (ref 0.0–0.7)
Eosinophils Relative: 2 % (ref 0–5)
Lymphocytes Relative: 21 % (ref 12–46)
Lymphs Abs: 1.9 10*3/uL (ref 0.7–4.0)
Monocytes Absolute: 0.8 10*3/uL (ref 0.1–1.0)

## 2011-02-03 LAB — URINE MICROSCOPIC-ADD ON

## 2011-02-03 LAB — POCT CARDIAC MARKERS
CKMB, poc: <1 ng/mL — ABNORMAL LOW (ref 1.0–8.0)
Myoglobin, poc: 75.8 ng/mL (ref 12–200)
Troponin i, poc: <0.05 ng/mL (ref 0.00–0.09)

## 2011-02-03 LAB — CBC
HCT: 31 % — ABNORMAL LOW (ref 36.0–46.0)
Hemoglobin: 10.4 g/dL — ABNORMAL LOW (ref 12.0–15.0)
MCV: 91.4 fL (ref 78.0–100.0)
WBC: 9.3 10*3/uL (ref 4.0–10.5)

## 2011-02-19 ENCOUNTER — Encounter: Payer: Self-pay | Admitting: Cardiovascular Disease

## 2011-02-24 ENCOUNTER — Ambulatory Visit (INDEPENDENT_AMBULATORY_CARE_PROVIDER_SITE_OTHER): Payer: Medicare Other | Admitting: Cardiovascular Disease

## 2011-02-24 ENCOUNTER — Encounter: Payer: Self-pay | Admitting: Cardiovascular Disease

## 2011-02-24 VITALS — BP 108/62 | HR 91 | Resp 18 | Ht 69.0 in | Wt 219.0 lb

## 2011-02-24 DIAGNOSIS — E785 Hyperlipidemia, unspecified: Secondary | ICD-10-CM

## 2011-02-24 DIAGNOSIS — R0600 Dyspnea, unspecified: Secondary | ICD-10-CM

## 2011-02-24 DIAGNOSIS — I2510 Atherosclerotic heart disease of native coronary artery without angina pectoris: Secondary | ICD-10-CM

## 2011-02-24 DIAGNOSIS — I10 Essential (primary) hypertension: Secondary | ICD-10-CM

## 2011-02-24 DIAGNOSIS — Z720 Tobacco use: Secondary | ICD-10-CM

## 2011-02-24 DIAGNOSIS — R0989 Other specified symptoms and signs involving the circulatory and respiratory systems: Secondary | ICD-10-CM

## 2011-02-24 MED ORDER — CLOPIDOGREL BISULFATE 75 MG PO TABS: 75.0000 mg | ORAL_TABLET | Freq: Every day | ORAL | Status: DC

## 2011-02-24 MED ORDER — SIMVASTATIN 20 MG PO TABS: 20.0000 mg | ORAL_TABLET | Freq: Every day | ORAL | Status: DC

## 2011-02-24 NOTE — Assessment & Plan Note (Signed)
Smoking cessation encouraged. She does not wish to stop smoking.

## 2011-02-24 NOTE — Progress Notes (Signed)
History of Present Illness: 77 yo WF with history of CAD, HTN, Hyperlipidemia, DM, GERD, anxiety/depression here today for cardiac follow up. She has been followed in the past by Dr. Juanda Chance.  Primary care is Dr. Dwana Melena. 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 A new DES was also placed at that time. She previously had a Cypher stent in the circumflex artery as well. A catheterization in 05/2009 showed only nonobstructive disease. An event monitor and this showed only isolated PVCs and APCs. She has chronic shortness of breath related to her COPD. She has continued to smoke, 1/2 ppd.   No changes in her breathing. Occasional sharp pains in her chest. These are sharp and and last for up to 30 minutes. She uses NTG which doesn't really help. She has been on ASA and Plavix per Dr. Juanda Chance for lifetime. She had an MRI in June 2011 which showed a 3.7 x 3.5 cm infrarenal aortic aneurysm. She has chronic back pain felt to be secondary lumbar disc bulging but not felt to be a surgical candidate. She also has esophageal strictures but told she could not be a candidate for dilatation. She has multiple complaints of joint pain but does not seem to have pain in her thighs or arms. She has been on Simvastatin for years. Her lipids are followed by Dr. Margo Aye.   Past Medical History  Diagnosis Date  . Coronary artery disease     post diaphragmatic wall infarction on 06-2007  . Chronic obstructive pulmonary disease   . Tobacco abuse   . Hypertension   . Hyperlipidemia   . Diabetes mellitus     with gastroparesis 70% retention at 2 hours  . GERD (gastroesophageal reflux disease)   . Anxiety disorder   . Depression   . Hypothyroidism   . Hx of hysterectomy   . AAA (abdominal aortic aneurysm)     Past Surgical History  Procedure Date  . Cholecystectomy   . Cataract surg   . Bladder surgery     tack  . Kidney stone surgery     Current  Outpatient Prescriptions  Medication Sig Dispense Refill  . albuterol (PROAIR HFA) 108 (90 BASE) MCG/ACT inhaler Inhale 2 puffs into the lungs every 6 (six) hours as needed.        . ALPRAZolam (XANAX) 0.5 MG tablet Take 0.5 mg by mouth 2 (two) times daily.        Marland Kitchen amLODipine (NORVASC) 5 MG tablet Take 5 mg by mouth daily.        Marland Kitchen aspirin 81 MG tablet Take 81 mg by mouth daily.        . clopidogrel (PLAVIX) 75 MG tablet Take 75 mg by mouth daily.        Marland Kitchen dexlansoprazole (DEXILANT) 60 MG capsule Take 60 mg by mouth daily.        . DULoxetine (CYMBALTA) 60 MG capsule Take 60 mg by mouth daily.        . furosemide (LASIX) 20 MG tablet Take 20 mg by mouth daily.        Marland Kitchen gabapentin (NEURONTIN) 100 MG capsule Take 300 mg by mouth 3 (three) times daily.       Marland Kitchen HYDROcodone-acetaminophen (VICODIN) 5-500 MG per tablet Take 1 tablet by mouth every 6 (six) hours as needed.        . Hydrocodone-APAP-Dietary Prod 7.5-750 MG MISC Take by mouth.        Marland Kitchen  hyoscyamine (LEVSIN SL) 0.125 MG SL tablet Place 0.125 mg under the tongue every 4 (four) hours as needed.        Marland Kitchen levothyroxine (SYNTHROID, LEVOTHROID) 112 MCG tablet Take 112 mcg by mouth daily.        Marland Kitchen lisinopril (PRINIVIL,ZESTRIL) 20 MG tablet Take 20 mg by mouth daily.        . potassium chloride SA (K-DUR,KLOR-CON) 20 MEQ tablet Take 20 mEq by mouth daily.        . simvastatin (ZOCOR) 40 MG tablet Take 40 mg by mouth at bedtime.        . sitaGLIPtan-metformin (JANUMET) 50-500 MG per tablet Take 1 tablet by mouth 2 (two) times daily with a meal.        . Vitamin D, Ergocalciferol, (DRISDOL) 50000 UNITS CAPS Take 50,000 Units by mouth once a week.          Allergies  Allergen Reactions  . Metoclopramide Hcl     REACTION: stopped aug 2009-pt didn't feel it helped. Pt felt jittery and shaky.  . Sulfonamide Derivatives     History   Social History  . Marital Status: Widowed    Spouse Name: N/A    Number of Children: N/A  . Years of  Education: N/A   Occupational History  . Not on file.   Social History Main Topics  . Smoking status: Current Everyday Smoker  . Smokeless tobacco: Not on file   Comment: She smokes 3 cigarretes daily  . Alcohol Use: No  . Drug Use: No  . Sexually Active: Not on file   Other Topics Concern  . Not on file   Social History Narrative  . No narrative on file    Family History  Problem Relation Age of Onset  . Coronary artery disease      family hx of  . Colon cancer Sister 37  . Colon cancer Daughter   . Prostate cancer      family hx of  . Lung cancer      family hx of  . Ovarian cancer      family hx of  . Arthritis      family hx of  . Other      family hx of chronic respiratory condition    Review of Systems:  As stated in the HPI and otherwise negative.   BP 108/62  Pulse 91  Resp 18  Ht 5\' 9"  (1.753 m)  Wt 219 lb (99.338 kg)  BMI 32.34 kg/m2  Physical Examination: General: Well developed, well nourished, NAD HEENT: OP clear, mucus membranes moist SKIN: warm, dry. No rashes. Neuro: No focal deficits Musculoskeletal: Muscle strength 5/5 all ext Psychiatric: Mood and affect normal Neck: No JVD, right  carotid bruit, no thyromegaly, no lymphadenopathy. Lungs:Clear bilaterally, no wheezes, rhonci, crackles Cardiovascular: Regular rate and rhythm. No murmurs, gallops or rubs. Abdomen:Soft. Bowel sounds present. Non-tender.  Extremities: No lower extremity edema. Pulses are palpable in the bilateral DP/PT.  ZOX:WRUEA, rate 91 bpm. LBBB.

## 2011-02-24 NOTE — Assessment & Plan Note (Addendum)
Stable. Continue current meds. She will remain on ASA and Plavix for lifetime. Continue statin. Will reduce dose of Simvastatin to 20 mg po QHS since she is on Norvasc as well. No beta blocker given her COPD.

## 2011-02-24 NOTE — Patient Instructions (Signed)
Your physician recommends that you schedule a follow-up appointment in: 6 months with Dr. Clifton James Your physician has requested that you have an echocardiogram. Echocardiography is a painless test that uses sound waves to create images of your heart. It provides your doctor with information about the size and shape of your heart and how well your heart's chambers and valves are working. This procedure takes approximately one hour. There are no restrictions for this procedure.  Your physician has requested that you have a carotid duplex. This test is an ultrasound of the carotid arteries in your neck. It looks at blood flow through these arteries that supply the brain with blood. Allow one hour for this exam. There are no restrictions or special instructions.  Your physician has recommended you make the following change in your medication: DECREASE ZOCOR to 20 mg daily.

## 2011-02-24 NOTE — Assessment & Plan Note (Signed)
Lipids followed by Dr. Margo Aye. Will lower Simvastatin to 20 mg po QHS.

## 2011-02-24 NOTE — Assessment & Plan Note (Signed)
BP well controlled. No changes.  

## 2011-03-04 ENCOUNTER — Encounter (INDEPENDENT_AMBULATORY_CARE_PROVIDER_SITE_OTHER): Payer: Medicare Other | Admitting: Cardiology

## 2011-03-04 ENCOUNTER — Ambulatory Visit (HOSPITAL_COMMUNITY): Payer: Medicare Other | Attending: Cardiovascular Disease

## 2011-03-04 DIAGNOSIS — F17200 Nicotine dependence, unspecified, uncomplicated: Secondary | ICD-10-CM | POA: Insufficient documentation

## 2011-03-04 DIAGNOSIS — I079 Rheumatic tricuspid valve disease, unspecified: Secondary | ICD-10-CM | POA: Insufficient documentation

## 2011-03-04 DIAGNOSIS — I059 Rheumatic mitral valve disease, unspecified: Secondary | ICD-10-CM | POA: Insufficient documentation

## 2011-03-04 DIAGNOSIS — R0989 Other specified symptoms and signs involving the circulatory and respiratory systems: Secondary | ICD-10-CM | POA: Insufficient documentation

## 2011-03-04 DIAGNOSIS — E119 Type 2 diabetes mellitus without complications: Secondary | ICD-10-CM | POA: Insufficient documentation

## 2011-03-04 DIAGNOSIS — E785 Hyperlipidemia, unspecified: Secondary | ICD-10-CM | POA: Insufficient documentation

## 2011-03-04 DIAGNOSIS — I2510 Atherosclerotic heart disease of native coronary artery without angina pectoris: Secondary | ICD-10-CM | POA: Insufficient documentation

## 2011-03-04 DIAGNOSIS — I6529 Occlusion and stenosis of unspecified carotid artery: Secondary | ICD-10-CM

## 2011-03-04 DIAGNOSIS — R0600 Dyspnea, unspecified: Secondary | ICD-10-CM

## 2011-03-04 DIAGNOSIS — R0609 Other forms of dyspnea: Secondary | ICD-10-CM | POA: Insufficient documentation

## 2011-03-08 ENCOUNTER — Encounter: Payer: Self-pay | Admitting: Cardiovascular Disease

## 2011-03-30 NOTE — Op Note (Signed)
NAME:  JUPITER, BOYS NO.:  [id number]   MEDICAL RECORD NO.:  [id number]          PATIENT TYPE:  INP   LOCATION:  A207                          FACILITY:  [facility]   PHYSICIAN:  Kassie Mends, M.D.      DATE OF BIRTH:  1928-04-16   DATE OF PROCEDURE:  03/24/2007  DATE OF DISCHARGE:  03/24/2007                               OPERATIVE REPORT   REFERRING PHYSICIAN:  [name], M.D.   PROCEDURE:  Small bowel capsule endoscopy.   INDICATION FOR EXAM:  Ms. Shoun is a 77 year old female who reports  having black watery stool.  She had an upper endoscopy on  ZHY8,6578 which showed no source for her black watery stool.  She  has a history of coronary artery disease and is maintained on Plavix.   FINDINGS:  The gastric passage time is 51 minutes.  The small bowel  passage time was 4 hours and 36 minutes.  There is no evidence of bright  red blood or melena in the lumen of the stomach, small intestines, or  the visualized portions of the colon.  She has no evidence of stricture,  erosion or masses.  Visualization of the small intestines is good.   RECOMMENDATIONS:  No source for blood watery stool is found.  Colonoscopy is scheduled for ION62,9528.  Ms. Mode should hold  her Plavix until her colonoscopy is complete.      Kassie Mends, M.D.  Electronically Signed     SM/MEDQ  D:  03/26/2007  T:  03/27/2007  Job:  [id number]   cc:   [name], M.D.  Fax: [phone]

## 2011-03-30 NOTE — Op Note (Signed)
NAME:  Madison Henson, Madison Henson NO.:  [id number]   MEDICAL RECORD NO.:  [id number]          PATIENT TYPE:  AMB   LOCATION:  DAY                           FACILITY:  [facility]   PHYSICIAN:  Kassie Mends, M.D.      DATE OF BIRTH:  12-Jul-1928   DATE OF PROCEDURE:  09/04/2008  DATE OF DISCHARGE:  09/04/2008                               OPERATIVE REPORT   REFERRING PHYSICIAN:  [name], MD   PROCEDURE:  Hydrogen breath test.   INDICATION FOR EXAM:  Ms. Kendrick is a 77 year old female who had  complains of early satiety, upper abdominal discomfort, and fullness.  She has nausea without vomiting.  She has had a workup which has  revealed uncontrolled gastroesophageal reflux disease on Nexium twice a  day.  She had a colonoscopy in May 2008 which showed small internal  hemorrhoids.  She had an upper endoscopy in September 2009 which showed  mild gastritis and duodenitis.   PREPROCEDURE DATA:  Ms. Dorgan denied any beans, brand, or high-fiber  foods.  She is not smoking, sleeping, or exercising within an hour of  the procedure.  She had not had any antibiotics or diarrhea.  She had  been n.p.o. since 1800 on September 03, 2008.  She ingested 25 g of  lactulose followed by a sip of water.   FINDINGS:  The initial breathalyzer read 3 parts per million.  She had a  peak of 5 parts per million at 8:45 with a trough of 4 parts per million  9:15.  The upper slope begin at 9:30 give parts per million.  She had a  measurement of 13 parts per million at 10:15 and 17 parts per million at  10:45.   DIAGNOSIS:  Ms. Darcey is a 77 year old female who has a hydrogen  breath test which is not consistent with small bowel bacterial  overgrowth.  No measurement was greater than 20 parts per million. Her  abdominal bloating and discomfort is most likely secondary to functional  abdominal pain or diabetic enteropathy.   RECOMMENDATIONS:  She should continue b.i.d. PPI.  She is not a surgical  candidate for Nissen fundoplication.      Kassie Mends, M.D.  Electronically Signed     SM/MEDQ  D:  09/05/2008  T:  09/06/2008  Job:  [id number]   cc:   [name], M.D.  Fax: [phone]

## 2011-03-30 NOTE — Cardiovascular Report (Signed)
NAME:  Madison Henson, Madison Henson NO.:  [id number]   MEDICAL RECORD NO.:  [id number]          PATIENT TYPE:  INP   LOCATION:  2912                         FACILITY:  [facility]   PHYSICIAN:  Everardo Beals. Juanda Chance, MD, FACCDATE OF BIRTH:  [birth year]   DATE OF PROCEDURE:  06/30/2007  DATE OF DISCHARGE:                            CARDIAC CATHETERIZATION   CLINICAL HISTORY:  Madison Henson is a 77 year old and had Cypher stents  placement in the right coronary and circumflex artery in 2003 and 2005.  The right coronary stent was placed in 2005.  She was admitted on August  8 with an acute diaphragmatic wall infarction due to stent thrombosis of  the right coronary artery.  She was treated with a thrombectomy, PTCA  and placement of a new drug-eluting stent at the distal edge of the  previous stent with a good result.  She reperfused at less than 2 hours,  then she came straight by [facility] to the cath lab.  She  did well after that although she had problems with bronchitis and cough  and wheezing.  She went home just a couple days ago and came back with  recurrent left-sided chest pain and continued wheezing.  There was  concern this might be related to ischemia.  She was scheduled for  catheterization.   PROCEDURE:  Left heart catheterization was performed percutaneously via  right femoral artery using arterial sheath and 6-French preformed  coronary catheters.  A frontal wall arterial puncture was performed,  Omnipaque contrast was used.  Right heart catheterization was performed  percutaneously through the right femoral vein using venous sheath and  Swan-Ganz thermodilution catheter.  The patient tolerated procedure well  and left the laboratory in satisfactory addition.  The right femoral  artery was closed with Angio-Seal at the end of the procedure.   RESULTS:  The left main coronary:  Left main coronary was free of  disease.   Left anterior descending artery:  Left  anterior descending artery gave  rise to three septal perforators and diagonal branch.  There was 15%  ostial narrowing in the LAD.   The circumflex artery.  The circumflex artery gave rise to a ramus  branch and two posterolateral branches.  There was less than 10%  narrowing at the stent sites in the proximal and mid vessel.   The right coronary artery:  The right coronary artery was a moderately  large vessel that gave rise to a right ventricle branch, posterior  branch and two small posterolateral branches.  The overlapping stents in  the proximal to mid right coronary had 0% stenosis.  There was 20%  narrowing just proximal to the stent.   The left ventriculogram:  The left ventriculogram performed in the RAO  projection showed slight hypokinesis of the inferobasal segment.  Vessel  motion was quite good.  Inferior wall moved better than on the acute  study several days earlier.  The estimated fraction was 60%.   HEMODYNAMIC RESULTS:  The right atrial pressure was 11 mean.  Pulmonary  pressure was 33/16 with mean of 23.  Pulmonary wedge pressure was 17  mean.  Left ventricular pressure was 118/16, aortic pressure 118/56 mean  of 83.   CONCLUSION:  Coronary artery disease with previous percutaneous coronary  interventions and recent diaphragmatic wall infarction due to stent  thrombosis treated with PTCA and a new drug-eluting stent with 50%  narrowing in the ostial and LAD, less than 10 cm narrowing at the drug-  eluting stent sites and the proximal mid circumflex artery, 0% stenosis  at the stent sites in the proximal to mid right coronary artery with 20%  narrowing just proximal to the stent, and slight inferior wall  hypokinesis with an estimated fraction 60%.  1. Mildly elevated left ventricular filling pressures.   RECOMMENDATIONS:  The patient has no evidence of recurrence at her stent  sites.  I think her chest pain is not cardiac.  She does have mild  elevation of her  left ventricular filling pressures, and will plan to  increase her diuretic slightly.  I think the major problem is related to  exacerbation of pulmonary disease, and will consider a course of  steroids along with bronchodilators and possible antibiotics.      Bruce Elvera Lennox Juanda Chance, MD, [facility]  Electronically Signed     BRB/MEDQ  D:  06/30/2007  T:  07/01/2007  Job:  [id number]   cc:   [name], M.D.  Cardiopulmonary Laboratory

## 2011-03-30 NOTE — Op Note (Signed)
NAME:  Madison Henson, Madison Henson NO.:  [id number]   MEDICAL RECORD NO.:  [id number]          PATIENT TYPE:  INP   LOCATION:  A215                          FACILITY:  [facility]   PHYSICIAN:  Kassie Mends, M.D.      DATE OF BIRTH:  1928/03/22   DATE OF PROCEDURE:  03/23/2007  DATE OF DISCHARGE:                                PROCEDURE NOTE   PROCEDURE:  Esophagogastroduodenoscopy with cold forceps biopsy.   INDICATION FOR EXAMINATION:  Madison Henson is a 77 year old female who  presents with a complaint of black watery stool.  She has a history of  coronary artery disease and is maintained on Plavix chronically. The EGD  is performed to evaluate her complaint of black watery stool.   FINDINGS:  1. Two small 3 to 4 mm flat gastric polyps seen in the antrum      bordering the pylorus.  Biopsies obtained via cold forceps.      Otherwise, no erosions, erythema or ulcerations seen in the      stomach.  2. Normal esophagus without evidence of Barrett's, erosions or      ulcerations.  3. Normal duodenal bulb and second portion in the duodenum.   RECOMMENDATIONS:  1. No source for black watery stool seen.  Would advance to medium      carbohydrate diet.  2. N.p.o. after midnight except medicines and will perform capsule      endoscopy on Mar 24, 2007.  3. If no source for black watery stools identified on capsule      endoscopy then will perform colonoscopy next week.  4. Protonix 40 mg daily.  5. Will call her with the results of her biopsies.   MEDICATIONS:  1. Demerol 50 mg IV.  2. Versed 5 mg IV.   PROCEDURE TECHNIQUE:  Physical exam is performed.  Informed consent was  obtained from the patient after explaining the benefits, risks, and  alternatives to the procedure.  The patient was connected to the monitor  and placed in the left lateral position.  Continuous oxygen was provided  by nasal cannula and IV medicine administered through an indwelling  cannula.  After  administration of sedation, the scope was advanced under  direct visualization  to the second portion of the duodenum.  The scope was subsequently  removed slowly by carefully examining the color, texture, anatomy, and  integrity onf the mucosa on the way out.  The patient was recovered in  endoscopy and discharged to the floor in satisfactory condition.      Kassie Mends, M.D.  Electronically Signed     SM/MEDQ  D:  03/23/2007  T:  03/23/2007  Job:  [id number]   cc:   [name], M.D.  Fax: [phone]

## 2011-03-30 NOTE — H&P (Signed)
NAME:  Madison Henson, Madison Henson NO.:  [id number]   MEDICAL RECORD NO.:  [id number]          PATIENT TYPE:  INP   LOCATION:  2901                         FACILITY:  [facility]   PHYSICIAN:  Everardo Beals. Juanda Chance, MD, FACCDATE OF BIRTH:  1928-08-06   DATE OF ADMISSION:  06/23/2007  DATE OF DISCHARGE:                              HISTORY & PHYSICAL   PRIMARY CARE PHYSICIAN:  Dr. [name] in [place], [place].   PRIMARY CARDIOLOGIST:  Dr. Jacqualyn Posey.   CHIEF COMPLAINT:  Inferior ST elevation myocardial infarction.   HISTORY OF PRESENT ILLNESS:  [name] is a 77 year old female with a  previous history of coronary artery disease.  She is followed by  physicians at [facility].  Today at approximately 9:00 a.m., she  had sudden onset of substernal chest pain.  She also had shortness of  breath.  It is a 10/10.  She called EMS.  Upon arrival to the scene, EMS  noted inferior ST elevation and transported her urgently to the  hospital.  En route, she received 6 mg of morphine and ASA 81 mg x4.  Nitro was avoided, because of the inferior ST elevation.  Upon arrival  to the emergency room, she was still complaining of 6/10 chest pain.  She denies nausea or diaphoresis.  This is similar to the pain  she has  had before that was anginal in nature.  She was taken urgently to the  cath lab.   PAST MEDICAL HISTORY:  1. Diabetes.  2. Hypertension.  3. Hyperlipidemia.   FAMILY HISTORY:  1. Coronary artery disease.  2. Hypothyroidism.  3. COPD.  4. History of nephrolithiasis.  5. Status post cardiac catheterization in March 2004, showing 85%      proximal mid-circumflex lesion, LAD luminal irregularities with a      55% distal lesion, 50% to 75% RCA, status post PTCA to the      circumflex, as well as a Cypher drug-eluting stent.  6. Status post cardiac catheterization May 05, 2004 showing critical      stenosis of the RCA treated with PTCA and Cypher stent, Circumflex      stents were patent, D 125-35 and no critical disease in the LAD.   SURGICAL HISTORY:  1. Cardiac catheterization x2.  2. Hysterectomy.  3. Cholecystectomy.  4. Bladder tack.   ALLERGIES:  SULFA.   CURRENT MEDICATIONS:  1. Prednisone 10 mg taper, started June 15, 2001.  2. Levaquin 500 mg daily started June 16, 2007, #7.  3. Synthroid 125 mcg daily.  4. Reglan 10 mg p.r.n.  5. Caduet 5/20 daily.  6. Janumet 50/5 b.i.d.  7. Plavix 75 mg daily (last filled May 17, 2007, and the bottle was      empty).  8. Lisinopril 10 mg daily.   SOCIAL HISTORY:  [place] in [place] with her  daughter.  She is retired and a widow.  She has a greater than 25 pack-  year history of tobacco use, which is ongoing but at a decreased level.  She has no history of alcohol or  drug abuse.   FAMILY HISTORY:  Her father is deceased with arthritis and no known  coronary artery disease.  Her mother had an MI at age 62.  No siblings  have coronary artery disease.   REVIEW OF SYSTEMS:  Other than chest pain and shortness of breath is  unobtainable.   PHYSICAL EXAM:  GENERAL:  She is a well-developed elderly white female  in no acute distress.  HEENT:  Normal.  NECK:  There is no JVD, no thyromegaly, and no carotid bruits are  appreciated.  CVA:  Heart is regular in rate and rhythm with an S1-S2, and no  significant murmur, rub or gallop is noted.  CHEST:  She has a few rales in the bases, but no crackles are noted.  ABDOMEN:  Soft and nontender with active bowel sounds.  EXTREMITIES:  There is no cyanosis, clubbing or edema noted.  Distal  pulses are decreased but intact in her lower extremities.  No femoral  bruits are appreciated.  MUSCULOSKELETAL:  There is no joint deformity or fusion, and no spine or  CVA tenderness is noted.  NEURO:  She is alert and oriented.  Cranial nerves II-XII grossly  intact.   IMPRESSION:  1. ST elevation inferior myocardial infarction:  The  patient is taken      urgently to the cath lab, and further evaluation and treatment will      depend on the results.  She will be continued on her home      medications with the exception of the metformin, and she will be      restarted on Plavix.      Theodore Demark, PA-C      Bruce R. Juanda Chance, MD, [facility]  Electronically Signed    RB/MEDQ  D:  06/23/2007  T:  06/23/2007  Job:  [id number]   cc:   [name], M.D.

## 2011-03-30 NOTE — Cardiovascular Report (Signed)
NAME:  Madison Henson, Madison Henson NO.:  [id number]   MEDICAL RECORD NO.:  [id number]          PATIENT TYPE:  INP   LOCATION:  2901                         FACILITY:  [facility]   PHYSICIAN:  Everardo Beals. Juanda Chance, MD, FACCDATE OF BIRTH:  11-24-1927   DATE OF PROCEDURE:  06/23/2007  DATE OF DISCHARGE:                            CARDIAC CATHETERIZATION   CLINICAL HISTORY:  [name] is 77 years old and had two  nonoverlapping Cypher stents placed in the circumflex artery in 2003.  In 2005, she had a long Cypher stent placed in the right coronary  artery.  A week ago, she developed a respiratory infection and was  treated with antibiotics and she stopped her Plavix at that time.  This  morning at 9 a.m., she developed severe chest pain and called EMS.  [facility] arrived at her home and did an ECG which showed an  inferior MI.  They called this in to [facility] and brought her  directly to the cardiac catheterization laboratory.  A code STEMI was  called.   PROCEDURE:  The procedure was performed via the right femoral artery and  arterial sheath and 6-French preformed coronary catheters.  After  completion of diagnostic study, I made a decision to use intervention on  the right coronary.  The patient was given Angiomax bolus infusion and  was given a 600 mg load of Plavix and four chewable baby aspirin.  We  used an AL-1, 6-French guiding catheter with side holes for the right  coronary intervention.  We were able to cross this totally occluded  stent in the proximal to mid vessel with a Prowater wire without  difficulty.  We attempted to pass a diver catheter into the stent to  perform aspiration thrombectomy, but were unable to pass the catheter  beyond the proximal edge of the stent.  We then went in with a 2.25 x 20-  mm, Maverick balloon and performed two inflations up to 8 atmospheres  x20 seconds.  This re-established flow.  We then performed intravascular  ultrasound with an Atlantis catheter.  This documented that the stent  was under deployed with a minimal diameter of 3.0 and a reference  diameter of 3.25.  There also was significant disease in the distal edge  of the stent and some mild to moderate disease at the proximal edge of  the stent.  We then expanded the stent with a 3.25 x 15-mm, Quantum  Maverick and performed three inflations up to 18 atmospheres x30  seconds.  We then deployed a 3.0 x 12-mm, Promus stent at the distal  edge of the stent just overlapping the previously placed Cypher stent.  We deployed this with one inflation of 14 atmospheres x20 seconds.  We  then post dilated this with the 3.25 x 50-mm, Quantum Maverick  performing one inflation up to 16 atmospheres x30 seconds.  There was  some residual filling defect in the proximal edge of the original Cypher  stent, so we went back in with the 3.25 x 50-mm, Quantum Maverick and  performed two inflations up  to 20 atmospheres x30 seconds.  Final  diagnostic studies were then performed through the guiding catheter.  The patient tolerated procedure well and left laboratory in satisfactory  condition.   RESULTS:  Left main coronary:  The left main coronary artery was free of  significant disease.   Left anterior descending artery:  The left anterior descending artery  gave rise to a diagonal branch and three septal perforators with no  major obstruction.   Circumflex artery:  The circumflex artery gave rise to a ramus branch  and AV branch which terminated into two posterolateral branches.  There  was a Cypher stent in the proximal and mid vessel and both of these had  less than 10% stenosis.   Right coronary artery:  The right coronary artery gave rise to a conus  branch and then was completely occluded at the beginning of the  previously placed Cypher stent.   Left ventriculogram:  The left ventriculogram from the RAO projection  showed inferior wall akinesis.   The apex and the anterolateral wall  moved well.  The estimated ejection fraction was 20%.   Following PTCA of the stent in the proximal mid right coronary and  following placement of a new overlapping stent at the distal edge of the  previously placed stent, the stenosis improved from 100% to 10% and the  flow improved from TIMI-0 to TIMI-3 flow.   The patient had the onset of chest pain at 9 a.m. and arrived at [facility] by [facility] at 10 a.m.  The first device  was at 10:38 a.m.  This gave a door to balloon time of 38 minutes and  refusion time of 1 hour and 38 minutes.   CONCLUSION:  1. Coronary artery disease, status post prior percutaneous coronary      intervention that is described above with acute diaphragmatic wall      infarction due to stent thrombosis of the stent in the proximal mid      right coronary artery with total occlusion at the stent site in the      right coronary, less than 10% stenosis at the two Cypher stents in      the circumflex artery, irregularities in the LAD and inferior wall      akinesis.  2. Successful reperfusion, balloon dilatation and placement of a new      Promus drug-eluting stent overlapping the previous Cypher stent      with improvement in narrowing from 100% to 10% and improving the      flow from TIMI-0 to TIMI-3 flow.   DISPOSITION:  The patient was returned for further observation.  She  will need to remain on long-term Plavix.  Because of some residual  filling defects within the stent, will plan to treat her with Integrilin  x18 hours and will continue Angiomax x2 hours.  The right femoral artery  was closed with Angio-Seal.      Bruce R. Juanda Chance, MD, [facility]  Electronically Signed     BRB/MEDQ  D:  06/23/2007  T:  06/23/2007  Job:  [id number]   cc:   Angus G. Renard Matter, MD  Cardiopulmonary Lab  Jacqualyn Posey, M.D.

## 2011-03-30 NOTE — H&P (Signed)
NAME:  Madison Henson, Madison Henson NO.:  [id number]   MEDICAL RECORD NO.:  [id number]          PATIENT TYPE:  INP   LOCATION:  4703                         FACILITY:  [facility]   PHYSICIAN:  Jonelle Sidle, MD DATE OF BIRTH:  [birth year]   DATE OF ADMISSION:  07/27/2007  DATE OF DISCHARGE:                              HISTORY & PHYSICAL   PRIMARY CARE PHYSICIAN:  Dr. Catalina Henson.   PRIMARY CARDIOLOGIST:  Dr. Charlies Henson.   CHIEF COMPLAINT:  Chest pain.   HISTORY OF PRESENT ILLNESS:  Madison Henson is a 77 year old female with  known coronary artery disease.  She had an MI on June 23, 2007 which  was treated with a drug-eluting stent to the RCA, and she came back to  the hospital with chest pain on August 15 and had a catheterization  showing no obstructive disease.  Since discharge from the hospital she  states her respiratory status has never been back to its baseline, and  she has felt more short of breath with increased wheezing, increased  coughing, and occasional production of thick white sputum.  She was,  otherwise, doing okay until three days ago when she had onset of  orthostatic dizziness which she says is new for her.  She has also had  tremors for two days which she states are worse when she tries to use  her hands and not always that noticeable at rest.  She does not feel  worse after she uses her albuterol nebulizer.   Today her dizziness was worse, and at approximately 8:30 a.m. she had  onset of mid scapular pain that was a sharp pain and going knife-like  through to her chest in the epigastric area.  It was like her symptoms  on both August 8 when she had an MI and on August 15 when her  catheterization was okay.  This is the first episode she has had since  August 15.  She did not take any medication at home, but immediately  called 911 and came to the emergency room.  In the emergency room she  received an albuterol nebulizer, a GI cocktail, aspirin,  sublingual  nitroglycerin, but none of these medications helped.  Her pain was 8/10  at first, and now she says it is a 6/10.   PAST MEDICAL HISTORY:  1. Status post cardiac catheterization August 15 with LAD less than      10% in-stent restenosis, circumflex 10%, RCA no in-stent      restenosis, a 20% proximal lesion, EF 60%.  2. Diaphragmatic MI August 8 with in-stent thrombus in the RCA treated      with percutaneous intervention and a PROMUS stent of the proximal      edge for additional stenosis.  3. Diabetes.  4. Dyslipidemia with low HDL and low LDL on medication.  5. Hypertension.  6. Family history of coronary artery disease.  7. Status post Cypher stent to the circumflex in 2003 and to the RCA      in 2005 at [facility].  8. COPD.  9. Gastroesophageal reflux disease.  10.Nephrolithiasis.  11.Hypothyroid.   SURGICAL HISTORY:  She is status post cardiac catheterizations as well  as hysterectomy, cholecystectomy, bladder tack, EGD, colonoscopy, and  neck surgery.   ALLERGIES:  SULFA.   CURRENT MEDICATIONS:  1. Proventil MDI 2 puffs q.6 h. p.r.n.  2. Lipitor 80 mg a day (transitioning to Zocor 40 mg a day for Dr.      Regino Schultze last note).  3. Janumet 50/500 b.i.d.  4, Aspirin 325 mg daily.  1. Plavix 75 mg a day.  2. Lisinopril 10 mg a day.  3. Reglan 10 mg a.c. and h.s.  4. Lasix 40 mg a day.  5. Synthroid 125 mcg daily.  6. Plavix 75 mg a day.  7. Nebulized albuterol t.i.d.   SOCIAL HISTORY:  She lives in [place] with her daughter and is a  retired from Pharmacologist work.  She is a widow with greater than 40 pack-  year history of ongoing tobacco use, but denies alcohol or drug abuse.   FAMILY HISTORY:  Mother died at age 40 of an MI, and her father died at  age 41 with no history of coronary artery disease.  There is also no  history of CAD in her siblings.   REVIEW OF SYSTEMS:  She stated she has had a couple of chills, and her  temperature was 99.9 at one  point.  She has vision loss.  Her skin is  extremely dry.  She has chronic dyspnea on exertion as well as  orthopnea, PND, and slight edema.  She does complain of some occasional  palpitations.  The dizziness and presyncope is described above as well  as the coughing and wheezing.  She denies arthralgias, hematemesis,  hemoptysis or melena, but states that her reflux symptoms are well-  controlled on Reglan.  A full 14-point Review of Systems is, otherwise,  negative.   PHYSICAL EXAMINATION:  VITAL SIGNS:  Temperature is 97.3, blood pressure  lying 100/52, pulse 72, sitting 117/62 with heart rate 74, standing  110/58 with a heart rate of 89 and at 3 minutes 125/67 with heart rate  of 83, respiratory rate 16, O2 saturation 97% on 2 liters.  GENERAL:  She is a well-developed elderly white female in no acute  distress.  HEENT:  Normal.  NECK:  There is no lymphadenopathy, thyromegaly, bruit or JVD noted.  CV:  Heart is regular in rate and rhythm with an S1, S2, no significant  murmur, rub or gallop is noted.  Distal pulses are intact in all four  extremities.  LUNGS:  She has pseudowheeze in her upper airway as well as some rales  in the bases and slight expiratory wheeze.  SKIN:  Extremely dry and has  some alligator appearance.  ABDOMEN:  Soft and nontender with active bowel sounds.  No  hepatosplenomegaly.  EXTREMITIES:  There is no cyanosis, clubbing or edema noted.  MUSCULOSKELETAL:  There is no joint deformity effusions, no spine or CVA  tenderness is noted.  NEUROLOGICAL:  She is alert and oriented.  Cranial nerves II-XII grossly  intact.   Chest x-ray showed COPD with no acute disease.   EKG:  Sinus rhythm rate 73 with no acute ischemic changes, and it has  improved post MI changes from EKG dated August 2008.   LABORATORY VALUES:  Hemoglobin 12.6, hematocrit 37, WBC 6.8, platelets  248.  Sodium 139, potassium 3.5, chloride 107, BUN 12, creatinine 1,  glucose 80.  Point of  care markers negative  x1.   IMPRESSION:  1. Chest pain.  Madison Henson was seen and examined, and the data were      reviewed.  She had a drug-eluting stent to the RCA with a STEMI on      June 23, 2007 and recurrent chest pain with a negative workup.      She saw Dr. Juanda Chance recently and was recovering well.  She stated      today she had breakfast and then after breakfast sometime between 8      and 8:30  she had sharp chest pain in the middle of her back that      radiated through to the epigastric area.  It was intense.  Her EKG      is without changes, and point of care markers are negative x1.  She      is status post cholecystectomy.  She has ongoing tobacco use with      apparent COPD and has increased cough.  At this point it is      doubtful that her symptoms are cardiac in origin, but she needs      observation overnight with cycled enzymes.  We will also check a      chest CT to rule out pulmonary embolism as well as assess her lungs      and aorta.  Per the patient's report she frequently sits for long      periods of time at the house, but denies lower extremity edema or      calf tenderness.  She may ultimately need a pulmonary evaluation.  2. Tremor/weakness:  Orthostatic vital signs were negative, and      according to the patient's her nebulizer and inhaler usage has not      changed much recently.  She will be followed closely.  Of note her      TSH was low during her previous hospitalization at 0.22, and this      will be rechecked as she may need an adjustment in her thyroid      medication.      Theodore Demark, PA-C      Jonelle Sidle, MD  Electronically Signed    RB/MEDQ  D:  07/27/2007  T:  07/28/2007  Job:  [id number]   cc:   Madison Henson, M.D.

## 2011-03-30 NOTE — Discharge Summary (Signed)
NAME:  Madison Henson, Madison Henson NO.:  [id number]   MEDICAL RECORD NO.:  [id number]          PATIENT TYPE:  INP   LOCATION:  A207                          FACILITY:  [facility]   PHYSICIAN:  [name], M.D.        DATE OF BIRTH:  1928/03/31   DATE OF ADMISSION:  03/22/2007  DATE OF DISCHARGE:  05/09/2008LH                               DISCHARGE SUMMARY   DISCHARGE DIAGNOSES:  1. Dark, tarry stools, question gastrointestinal bleed.  2. Diabetes mellitus, type 2.  3. Hypothyroidism.  4. Generalized weakness.  5. Coronary artery disease.  6. Chronic obstructive pulmonary disease.  7. Mild anemia.  8. Abdominal aortic aneurysm.   DISCHARGE MEDICATIONS:  1. Prevacid 1 tab p.o. daily.  2. Caduet 5/20 p.o. daily.  3. Synthroid 0.125 mg p.o. daily.  4. Janumet 50/500, 1 tab p.o. b.i.d.  5. Lisinopril 10 mg p.o. b.i.d.  6. Reglan p.o. q.a.c. and q.h.s. before meals.  7. Nebulizer albuterol 2.5 mg inhaled t.i.d. also may be on Proventil      inhaler, too, which is similar to this nebulizer.  The patient is      to stop her Plavix for now.   BRIEF HISTORY OF PRESENT ILLNESS:  Ms. Snead is a pleasant 77 year old  white female with multiple medical problems, who presented with report  of approximately 1 week of dark, tarry stools with significant history  of GI bleed before with scope done by Dr. Linna Darner in [place].  She was  admitted for IV fluid rehydration and evaluation of suspected GI bleed.   OBJECTIVE FINDINGS AT TIME OF DISCHARGE:  VITAL SIGNS:  Temperature is  97.7, blood pressure 117/69, pulse 67, respirations 20, O2 saturations  95% on room air.  CBGs have been 105, 121, 132, respectively.  GENERAL:  This is a white female sitting in a chair in no acute  distress.  HEENT:  Unremarkable.  Pupils equal, round, and reactive to light and  accommodation.  Extraocular movements intact.  LUNGS:  Clear to auscultation bilaterally.  A long expiratory phase.  Did not appreciate  any rhonchi or crackles at this time.  HEART:  Regular rate and rhythm.  ABDOMEN:  Soft, still some mild right lower quadrant pain to palpation,  positive bowel sounds but no specific rebound tenderness.  EXTREMITIES:  No lower extremity edema.  SKIN:  Dryness on skin on arms and legs.  NEUROLOGIC:  Alert and oriented x3.  No deficits noted.   LABORATORY DATA:  Obtained during hospitalization.  Initial cardiac  panel revealed CK of 55, MB 1.2, troponin I 0.2.  Recheck of troponin I  in the morning showed 0.02 as well.  Initial CBC showed a white count of  9.4, hemoglobin 12.2, platelet count 237.  At time of discharge, white  count is 6.2, hemoglobin 11.6, platelet count 224.  A CMET showed normal  throughout with BUN of 8, creatinine 0.69.  UA was negative.  Urine  culture showed 9000 colonies of insignificant growth.   IMAGES OBTAINED DURING HOSPITALIZATION:  Chest x-ray showed no acute  infiltrate, minimal  bronchitic changes.  A CT of abdomen and pelvis did  show tiny, nonspecific low attenuation foci in left kidney; small  abdominal aortic aneurysm 3.5 cm greatest axial dimension.  Pelvis did  not show any abnormalities.   HOSPITAL COURSE PER PROBLEM:  1. Dark, tarry stools, question gastrointestinal bleed.  GI was      consulted, and Plavix was held.  Did have an upper endoscopy which      did show some gastric polyps which were removed and sent for      pathology.  This was done by Dr. Cira Servant.  She had a capsule      endoscopy as well which did not reveal any signs of blood, either.      Since she was on Plavix, unable to do colonoscopy urgently, but the      patient has not had any further signs of drop in her blood count or      any further signs of black, tarry stools.  She will be set up for a      colonoscopy in approximately a week and a half by Dr. Cira Servant as an      outpatient.  She is to continue to hold her Plavix at this time.   1. Coronary artery disease.  She does  not exhibit any further chest      pain, and she is on Plavix due to this but has had stents placed      back in 2002, 2004, 2005, and will be okay to be off of the Plavix      for short term.  Once colonoscopy is done, if there are no signs of      any further bleeding, then would recommend resuming this.   1. Chronic obstructive pulmonary disease.  She is to continue on her      nebulizer.  She apparently is doing well at home and has not      required any oxygen and at this time did have some mild bronchitic      changes on chest x-ray but no problems.   1. Weakness.  May have been mildly dehydrated initially and appears to      be doing better at this time.   1. Diabetes.  She has been diet controlled during the hospitalization      and will continue with the Janumet for now.   DISPOSITION:  The patient will be discharged to home and follow up in my  office in approximately 2 weeks and will need to follow up with Dr.  Cira Servant as well for schedule for a colonoscopy.      [name], M.D.  Electronically Signed     ZH/MEDQ  D:  03/24/2007  T:  03/25/2007  Job:  [id number]

## 2011-03-30 NOTE — Assessment & Plan Note (Signed)
[facility]                            CARDIOLOGY OFFICE NOTE   NAME:Madison Henson, Madison Henson                      MRN:          [id number]  DATE:07/07/2007                            DOB:          1928-03-20    ADDENDUM:  She is on 80 of Lipitor, and she has both a low HDL and a  very low LDL.  I think from the standpoint of her HDL, she would be  better off on simvastatin, and it should be cheaper for her.  So let's  switch her from Lipitor 80 to simvastatin 40 daily.     Bruce Elvera Lennox Juanda Chance, MD, [facility]  Electronically Signed    BRB/MedQ  DD: 07/07/2007  DT: 07/08/2007  Job #: [id number]

## 2011-03-30 NOTE — H&P (Signed)
NAME:  Madison Henson, Madison Henson NO.:  [id number]   MEDICAL RECORD NO.:  [id number]          PATIENT TYPE:  INP   LOCATION:  2912                         FACILITY:  [facility]   PHYSICIAN:  Jonelle Sidle, MD DATE OF BIRTH:  [birth year]   DATE OF ADMISSION:  06/29/2007  DATE OF DISCHARGE:                              HISTORY & PHYSICAL   PHYSICIANS:  Primary cardiologist Dr. Juanda Chance.  Primary care physician  Dr. Shelva Majestic in [place].   PATIENT PROFILE:  This patient is a 77 year old Caucasian female with a  history of CAD and recent inferior ST-segment MI and stenting of the RCA  with Promus drug-eluting stent who presents with recurrent chest  discomfort.   PROBLEM LIST:  1. CAD.  1A.  Status post catheterization and PCI in March of 2004 at [facility].  There was PTCA of the left circumflex and Cypher drug-eluting  stent placement in the right coronary artery.  1B.  On June 23, 2007, inferior ST-segment elevation MI/cardiac  catheterization.  Left main normal, LAD normal, left circumflex normal,  RCA 100% proximal, EF 20%.  The RCA stenosis was felt to be latent  thrombosis within the previously placed Cypher drug-eluting stent.  This  was successfully stented with a 3.0 x 12 mm Promus drug-eluting stent.  1. Hypothyroidism.  2. COPD.  3. Nephrolithiasis.  4. Status post hysterectomy.  5. Status post cholecystectomy.  6. Hypertension.  7. Hyperlipidemia.  8. Type 2 diabetes mellitus.  9. Tobacco abuse.  10.Ischemic cardiomyopathy with EF of 20%.   HISTORY OF PRESENT ILLNESS:  A 77 year old Caucasian female with a  recent inferior ST-segment elevation myocardial infarction, status post  PCI of the RCA with drug-eluting stent placement, overlapping previously  placed drug-eluting stent.  This was performed June 23, 2007.  She was  discharged June 27, 2007.  She was in her usual state of health until  this morning when, while sitting on her porch, she  developed sharp left-  sided chest pain from the axilla to the midchest with left squeezing.  There were otherwise no associated symptoms.  She took nitroglycerin  without relief and then presented to her primary care physician's office  where ECG was performed and our office was contacted and she was advised  to present to the [facility] ED.  She was taken via EMS from the  doctor's office to the [facility] ED.  Here she continued to have  intermittent low-level chest discomfort without associated symptoms.  ECG is without acute changes and her first set of cardiac markers reveal  a CK of 73 with an MB of 2.2 and troponin-I of 0.45.  Of note, this  appears to be trending down from her last admission.   ALLERGIES:  ALLERGIES TO SULFA.   MEDICATIONS:  Home medications:  1. Aspirin 325 mg daily.  2. Plavix 75 mg daily.  3. Synthroid 125 mcg daily.  4. Reglan 10 mg q.i.d.  5. Lipitor 80  mg daily.  6. Janumet 50/500 mg daily.  7. Albuterol and Atrovent inhalers q.i.d.  8. Nitroglycerin p.r.n.  9. Lasix 20 mg daily.  10.Lisinopril 10 mg daily.   FAMILY HISTORY:  Mother died from an MI at age 53.  Father died with a  history of arthritis.  She does not know too much otherwise.  Siblings:  No history of CAD.   SOCIAL HISTORY:  She lives in [place] with her daughter.  She is  retired.  She is widowed.  She has a greater than 25 pack-year history  of tobacco abuse which is ongoing.  She denies any alcohol or drugs.   REVIEW OF SYSTEMS:  Positive for chest pain as outlined in the HPI,  otherwise all systems reviewed and negative.   PHYSICAL EXAMINATION:  VITAL SIGNS:  Temperature of 97.8.  Heart rate of  69.  Respirations 20.  Blood pressure of 124/68.  Pulse ox 98% on room  air.  GENERAL:  A pleasant white female in no acute distress.  Awake, alert  and oriented times three.  NECK:  No bruits or JVD.  LUNGS:  Respirations were unlabored with scattered expiratory wheezing.   CARDIAC:  Regular S1, S2.  No S3, S4 murmurs.  ABDOMEN:  Round, soft, nontender, nondistended.  Bowel sounds present  times four.  EXTREMITIES:  Warm, dry, pink.  No clubbing, cyanosis or edema.  Dorsalis pedis and posterior tibial pulses 1+ bilaterally.   LABORATORY DATA:  Chest x-ray shows no acute cardiopulmonary process.  EKG showed sinus rhythm with a rate of 72 and no acute ST or T-wave  changes.  Lab work:  Hemoglobin 12.4, hematocrit 36.2, WBC 8.5,  platelets 226,000.  Sodium 140, potassium 3.8, chloride 108, CO2 of 26,  BUN 9, creatinine 0.79, glucose 79.  Total bilirubin 6.3, AST 50 and ALT  20, total protein 6.3, albumin 3.4, CK 73, MB 2.2, troponin-I 0.45, PT  12.6, INR 0.9, calcium 8.5.   ASSESSMENT/PLAN:  1. Chest pain/coronary artery disease:  The patient is recently status      post percutaneous coronary intervention and stenting of the right      coronary artery with Promus drug-eluting stent.  She has had      recurrent chest discomfort today.  At this point, troponins appear      to be trending down, however, we will continue to cycle those.  We      will add heparin and nitroglycerin as she is having some      intermittent discomfort.  The plan is re-look cardiac      catheterization in the morning or sooner if she becomes unstable.      We will continue her aspirin, Plavix, statin and ACE inhibitor.      She is not on a beta blocker secondary to a history of bronchospasm      with ongoing wheezing.  2. Hypertension, which is stable:  Continue ACE inhibitor therapy.  3. Hyperlipidemia:  Continue statin therapy.  Liver function tests      appear to be within normal limits.  4. Ischemic cardiomyopathy:  No evidence of failure on exam or chest x-      ray.  Follow this closely and obtain 2D echocardiogram.  5. Type 2 diabetes mellitus:  Continue her Januvia, but hold her      metformin.  Add the sliding scale insulin.  6. Chronic obstructive pulmonary disease:  She  is actively wheezing.      Continue albuterol and Atrovent inhalers.  No beta blockers.      Nicolasa Ducking, ANP  Jonelle Sidle, MD  Electronically Signed    CB/MEDQ  D:  06/29/2007  T:  06/30/2007  Job:  [id number]

## 2011-03-30 NOTE — Assessment & Plan Note (Signed)
[facility]                            CARDIOLOGY OFFICE NOTE   NAME:Marmo, KAREL MOWERS                      MRN:          [id number]  DATE:07/07/2007                            DOB:          02-17-1928    PRIMARY CARE PHYSICIAN:  Dr. [name] in [place].   CLINICAL HISTORY:  Mahreen Schewe returns for a followup visit after a  recent hospitalization.  She was initially hospitalized on August 8 with  a diaphragmatic wall infarction due to stent thrombosis in the proximal  right coronary artery.  She underwent reperfusion and placement of a new  Promus stent for edge stenosis, and the initial stent was expanded.  She  was readmitted after discharge with shortness of breath and chest pain,  and had a re look angiogram which showed no evidence of restenosis.  Her  wedge pressure was slightly elevated, but we thought her major problem  was exacerbation of her pulmonary disease.  She was discharged on a  prednisone taper.  She has been doing somewhat better.  She has had some  epigastric pain which is worse with sitting and leaning forward.  She  has had no exertional chest pain.  She has had some mild wheezing,  although this is somewhat better.   PAST MEDICAL HISTORY:  1. Chronic obstructive pulmonary disease.  2. Gastroesophageal reflux disease.  3. Hypertension.  4. Hyperlipidemia.  5. Type 2 diabetes.  6. Tobacco use.  7. Her left ventricular ejection fraction on followup study was good      at 60% with only mild inferior wall hypokinesis.   CURRENT MEDICATIONS:  1. Spiriva.  2. Plavix.  3. Aspirin.  4. Synthroid.  5. Reglan.  6. Lipitor.  7. Lisinopril.  8. Januvia.  9. Albuterol inhaler.  10.Nexium.  11.Doxycycline.  12.Prednisone taper.   PHYSICAL EXAMINATION:  The blood pressure is 118/69, the pulse is 69 and  regular.  There was no venous distension.  Carotid pulses were full.  The chest had some decreased breath sounds and  scattered wheezes.  Cardiac rhythm was regular.  The heart sounds were diminished, but I  could hear no murmur.  The abdomen was soft with normal bowel sounds.  There is no  hepatosplenomegaly.  There is no peripheral edema, pedal pulses were equal.   An electrocardiogram showed sinus rhythm and only very small inferior Q-  waves and was normal.   IMPRESSION:  1. Coronary artery disease with recent diaphragmatic wall infarction      due to stent thrombosis of the right coronary artery, treated with      percutaneous transluminal coronary angioplasty and additional      Promus stent, now stable.  2. Previous percutaneous coronary intervention with Cypher stent to      the circumflex artery and right coronary artery.  3. Good left ventricular function with an ejection fraction of 60%.  4. Chronic obstructive pulmonary disease with recent exacerbation.  5. Hypertension.  6. Hyperlipidemia.  7. Diabetes.  8. Tobacco use.   RECOMMENDATIONS:  I think Ms. Capes is doing well from a  cardiac  standpoint.  I think the pain she has is not cardiac and may be related  to her pulmonary disease.  Will plan to continue her current treatment.  Will get a BMET today.  I asked her to see Dr. Margo Aye within the next  couple of weeks to follow up on treatment of her pulmonary disease.  I  will plan to see her back in 6 weeks.   ADDENDUM:  She is on 80 of Lipitor, and she has both a low HDL and a  very low LDL.  I think from the standpoint of her HDL, she would be  better off on simvastatin, and it should be cheaper for her.  So let's  switch her from Lipitor 80 to simvastatin 40 daily.     Bruce Elvera Lennox Juanda Chance, MD, [facility]  Electronically Signed    BRB/MedQ  DD: 07/07/2007  DT: 07/08/2007  Job #: [id number]

## 2011-03-30 NOTE — Assessment & Plan Note (Signed)
[facility]                             PULMONARY OFFICE NOTE   NAME:JOHNSONBelissa, Henson                      MRN:          [id number]  DATE:08/18/2007                            DOB:          February 21, 1928    Madison Henson a 77 year old active smoker was treated for a recent COPD  exacerbation and recent diaphragmatic wall infarction due to stent  thrombosis in the RCA that was perfused and restented.  She has now cut  down her cigarettes to 3-4 per day.  She is somewhat improved with  prednisone and is undergoing cardiac rehab 3 times a week.  Her main  complaint seems to be shaking of her extremities.  This does not seem to  be related to her albuterol nebs.  She also reports intermittent  dyspnea.  Cough is productive of clear phlegm now.  She has not brought  her medications with her today but tells me that she is taking the  Spiriva and Advair, too, although this has not been listed in her  medication list.  I do note that she was given this on hospital  discharge.   CURRENT MEDICATIONS:  1. Spiriva HandiHaler daily.  2. Advair discus 250/50 b.i.d.  3. Plavix 75 mg daily.  4. Synthroid 125 mcg daily.  5. Reglan 10 mg q.i.d.  6. Lipitor 80 mg daily.  7. Lisinopril 10 mg daily.  8. Januvia 50/500 mg daily.  9. Aspirin 325 mg daily.  10.Albuterol nebs p.r.n.  11.Nexium 40 mg daily.   EXAM:  Weight 186.4 pounds, temperature 98.1, blood pressure 120/50,  heart rate 70 per minute, oxygen saturation 97% on room air.  CHEST:  Bilateral basilar expiratory rhonchi.  CVS:  S1, S2 normal.  No pedal edema.   IMPRESSION AND PLAN:  1. Chronic obstructive pulmonary disease exacerbation.  In view of      persistent wheezing, I will give her longer course of steroids.  I      have asked her to take 20 mg of prednisone for a week and then      taper by 5 mg every week.  2. Spirometry showed moderate airway obstruction with an FEV1 of 70%      and FVC of 63%.   I think she is on maximal therapy with Advair and      Spiriva.  3. I have asked her to decrease her albuterol nebs and see if her      tremors are improved.  4. Smoking cessation was again emphasized, and I discussed that the      medicines would not have any affect in view of her ongoing tobacco      exposure.  She seems motivated and will try to cut down by a      cigarette every week.  5. Flu shot was administered today.  She has received a Pneumovax in      2007.  She will see      our nurse practitioner in about 4 weeks towards the end of her      prednisone taper, and I  will try to see her again in 3 months'      time.     Oretha Milch, MD  Electronically Signed    RVA/MedQ  DD: 08/18/2007  DT: 08/18/2007  Job #: [id number]   cc:   [name], M.D.  Bruce Elvera Lennox Juanda Chance, MD, [facility]

## 2011-03-30 NOTE — Consult Note (Signed)
NAME:  Madison Henson, Madison Henson NO.:  [id number]   MEDICAL RECORD NO.:  [id number]          PATIENT TYPE:  AMB   LOCATION:  DAY                           FACILITY:  [facility]   PHYSICIAN:  Kassie Mends, M.D.      DATE OF BIRTH:  [birth year]   DATE OF CONSULTATION:  06/18/2008  DATE OF DISCHARGE:                                 CONSULTATION   REASON FOR CONSULTATION:  Refractory acid reflux.   PHYSICIAN REQUESTING CONSULTATION:  [name], MD.   PHYSICIAN CO-SIGNING NOTE:  Kassie Mends, MD.   HISTORY OF PRESENT ILLNESS:  The patient is a 77 year old Caucasian  female with history of gastroesophageal reflux disease, coronary artery  disease, status post MI and stenting back in August 2008, who presents  today with complaints of refractory GERD.  She is on Nexium 40 mg b.i.d.  She has also been on metoclopramide 10 mg q.a.c., nightly for over 4-5  years for unclear reasons.  She states she has postprandial early  satiety and upper abdominal discomfort and fullness.  She has heartburn,  nausea without vomiting.  She states she has severe burning in her chest  at times, and she is afraid that she is having another MI.  She has had  dysphagia to solid foods.  Even with drinking just liquids, she feels  like the food is coming back up.  She has frequent nocturnal symptoms  which keeps her up at night.  She also has alternating constipation,  diarrhea, may go 3 or 4 days without stool and then have loose stools  for 3-4 days.  This has been occurring over the past 3-4 months.  Her  newest medication is Cymbalta, and she states she has been on that for  about a year.   She was seen back in May 2008 for report of GI bleed with black watery  stools.  At that time, she had a EGD which revealed 2 small 3- to 4-mm  flat gastric polyps in the antrum which were benign.  Biopsy negative  for H. Pylori.  Then she underwent a small bowel capsule endoscopy which  was unremarkable.  Colonoscopy  on Apr 05, 2007, revealed an extremely  tortuous sigmoid colon, sigmoid colon diverticulitis, two 4-6 mm  ascending colon polyps, and a 6-mm transverse colon polyp, which are  tubular adenomas.  There was no explanation for black watery stools.  She states that she had an MI in August 2008 after being off of her  Plavix for about a week.  One of her stents became blocked.  She had  also been off her Plavix during her hospitalization when she had GI  bleeding.   CURRENT MEDICATIONS:  1. Cymbalta 60 mg daily.  2. Plavix 75 mg daily.  3. Reglan 10 mg q.a.c. nightly.  4. Simvastatin 40 mg nightly.  5. Lasix 40 mg daily.  6. Synthroid 112 mcg daily.  7. Aspirin 325 mg daily.  8. Norvasc 5 mg daily.  9. Xanax 0.25 mg daily.  10.Janumet 50/500 mg b.i.d.  11.Lisinopril 20 mg daily.  12.Proventil inhaler  p.r.n.  13.Nexium 40 mg b.i.d.   ALLERGIES:  SULFA causes hives and swelling.   PAST MEDICAL HISTORY:  1. Diabetes mellitus.  2. Emphysema.  3. Hypothyroidism.  4. Hyperlipidemia.  5. Coronary artery disease, status post MI in August 2008 due to an in-      stent thrombosis and distal plaque.  She required restenting.  6. Gastroesophageal reflux disease.  7. Anxiety, depression.  8. Status post hysterectomy, cholecystectomy, kidney stone extraction,      bladder tack, EGD and colonoscopy as above, and prior neck surgery.   FAMILY HISTORY:  Sister had colon cancer at age 35 or 22.  Daughter had  polyps.  Family history of prostate cancer and lung cancer and ovarian  cancer.   SOCIAL HISTORY:  She is widowed.  She lives with her daughter.  She  never drank any alcohol.  She smokes half a pack of cigarettes daily.   REVIEW OF SYSTEMS:  See HPI for GI.  CONSTITUTIONAL:  Denies any weight  loss. CARDIOPULMONARY:  Denies ongoing chest pain.  She does have some  dyspnea on exertion.   PHYSICAL EXAMINATION:  VITAL SIGNS:  Weight 186, height 5 feet 9 inches,  temp 98.7, blood pressure  110/70, and pulse 80.  GENERAL:  Pleasant, well-nourished, well-developed, elderly Caucasian  female in no acute distress.  SKIN:  Warm and dry. No jaundice.  HEENT:  Sclerae nonicteric.  Oropharyngeal mucosa moist and pink.  No  lesions, erythema, or exudates.  No lymphadenopathy or thyromegaly.  CHEST:  Lungs are clear to auscultation.  CARDIAC:  Regular rate and rhythm.  Normal S1 and S2.  No murmurs, rubs,  or gallops.  ABDOMEN:  Positive bowel sounds.  Abdomen is soft and nondistended.  She  has moderate tenderness in the epigastrium to deep palpation.  No  rebound or guarding. No abdominal bruits or hernia.  LOWER EXTREMITIES:  No edema.   LABORATORY DATA:  From Apr 12, 2008, LFTs normal.  TSH normal.   IMPRESSION:  Ms. [name] is a pleasant 77 year old lady who presents  with refractory gastroesophageal reflux disease symptoms, nausea,  postprandial fullness and early satiety as well as complaints of  dysphagia.  Notably, on EGD a year ago, she had hiatal hernia and benign  gastric polyps.  At that time, she was not having these symptoms.  She  is on Reglan q.a.c. nightly and states she has been on this for over 4-5  years.  She does not believe it has helped in any of her symptoms.  She  denies any prior testing such as gastric emptying study.  Symptoms may  be due to refractory gastroesophageal reflux disease plus or minus  dyspepsia, gastroparesis.  Cannot rule out development of esophageal  webbing or stricture.  We would offer her an upper endoscopy for further  evaluation.  We have discussed today that she would be at increased risk  of bleeding if she remains on Plavix and aspirin prior to procedure.  We  will discuss further with her cardiologist to see what they feel is  appropriate for stopping for the procedure.  This patient did have a  thrombosis of one her stents last year while off of the Plavix.   She has mild tardive dyskinesia on exam and complains of  intermittent  tremor.  She states she has had this for over 10 years possibly before  she even started the Reglan.  Given that she does not feel the Reglan is  helping and she has these symptoms, I would like her to stop the  medication.   PLAN:  1. She will stop the Reglan over the course of next 4 days.  2. EGD in the near future.  I will discuss with Dr. Charlies Constable      regarding whether or not we can hold her aspirin or her Plavix      prior to procedure as she may need to have esophageal dilation.  3. We will discuss further with Dr. Cira Servant as well.  Further      recommendations to follow.  I would like to thank Dr. [name] for      allowing [place] to take part in the care of this patient.   ADDENDUM [id number]:  Spoke with Dr. Juanda Chance. EGD on ASA & Plavix. Will  perform 48 HR Bravo Study on BID Nexium. EGD 5/08 no malignancy or H.  pylori.      Tana Coast, P.A.      Kassie Mends, M.D.  Electronically Signed    LL/MEDQ  D:  06/18/2008  T:  06/19/2008  Job:  [id number]   cc:   Kassie Mends, M.D.  [address]  [place] , [place] [address]   [name], M.D.  Fax: [phone]   Everardo Beals. Juanda Chance, MD, [facility]  [address]  [place], [place] [address]

## 2011-03-30 NOTE — Consult Note (Signed)
NAME:  Madison Henson, CRONIC NO.:  [id number]   MEDICAL RECORD NO.:  [id number]          PATIENT TYPE:  INP   LOCATION:  4703                         FACILITY:  [facility]   PHYSICIAN:  Oretha Milch, MD      DATE OF BIRTH:  07-17-1928   DATE OF CONSULTATION:  07/31/2007  DATE OF DISCHARGE:                                 CONSULTATION   REFERRING PHYSICIAN:  Dr. Daleen Squibb from [facility].   PRIMARY CARE PHYSICIAN:  [name], M.D. from [facility].   REASON FOR CONSULTATION:  COPD exacerbation.   HISTORY OF PRESENT ILLNESS:  Ms. Madison Henson is a 77 year old Caucasian  active smoker who presented with chest pain radiating from between her  shoulder blades, her arms to her sides, which was constant and worsened  by cough.  This was associated with cough-productive greenish phlegm and  dyspnea and dizziness.  She also complained of tremors, which had been  worse over the last 2 days.  Cardiac enzymes were negative, and her  chest x-ray did not show any infiltrates or rib fractures.  CT chest did  not show any evidence of pulmonary embolism or any other explanation for  her chest pain.  We are consulted due to presence of bronchospasm for  treatment of COPD.   She is a retired Programme researcher, broadcasting/film/video and has smoked about a pack per day; at  least, she has cut down to 7 cigarettes a day but continues to smoke.  She sees Dr. Margo Aye at [place] and has been maintained on a regimen of  Advair, Spiriva, and albuterol nebs.  She uses the Advair about once a  day.  For the past 10 days, she was given doxycycline due to a greenish  sputum, and in the hospital, she has received a course of the Zithromax,  but she continues to produce green phlegm with occasional blood tinged.  BNP level was 79 and 2D echo estimated normal LV function.   PAST MEDICAL HISTORY:  Includes:  1. Coronary artery disease status post MI in August 2008 due to stent      thrombosis.  Cardiac cath showed slightly elevated  wedge pressure      and post procedure.  She required a course of steroids for dyspnea      and wheezing.  2. Gastroesophageal reflux disease.  3. Hypertension.  4. Hyperlipidemia.  5. Type 2 diabetes.  6. 2D echo estimated an EF of 60% with mild inferior wall hypokinesis.   PAST SURGICAL HISTORY:  1. Cardiac cath.  2. History of hemicholecystectomy.  3. Bladder tack surgery.  4. Endoscopy.  5. Neck surgery.  6. Colonic surgery.   CURRENT MEDICATIONS:  Include:  1. Spiriva HandiHaler daily.  2. Advair once a day.  3. Albuterol nebs as needed.  4. Albuterol MDI as needed.  5. Recent course of doxycycline.  6. Janumet 50/500 b.i.d.  7. Lipitor 80 mg daily.  8. Aspirin 325 mg daily.  9. Lisinopril 10 mg daily.  10.Reglan 10 a.c. and h.s.  11.Lasix 40 mg daily.  12.Synthroid 125 mcg daily.  13.Plavix 75 mg  daily.   SOCIAL HISTORY:  She lives with her daughter in [place].  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  She continues to smoke 7 to 8  cigarettes per day.   FAMILY HISTORY:  Her mother died at age of 80 of coronary artery  disease.   REVIEW OF SYSTEMS:  Currently reports green phlegm, wheezing.  She  sleeps about 4 hours every night and is awakened by chest pain and  dyspnea.   PHYSICAL EXAM:  GENERAL:  Adult woman sitting up in bed in no apparent  respiratory distress.  VITAL SIGNS:  Temperature 97.7, heart rate 61, respirations 20, blood  pressure 122/68, oxygen saturation 96% on room air.  Chemistry:  Glucose  125.  CVS:  S1, S2 normal.  CHEST:  Diffuse rhonchi bilaterally.  ABDOMEN:  Soft, nontender.  NEUROLOGICALLY:  Nonfocal.  EXTREMITIES:  No edema.   CK was 53, troponin 0.01, BNP level was 79.  TSH 2.2.  White blood cell  count 6.8, hemoglobin 11.7, platelets 248.  BUN and creatinine were 26  and 1.1.  Potassium 4.1, calcium 9.2.  Chest x-ray did not show any  infiltrates.   IMPRESSION:  1. Likely severe chronic obstructive pulmonary  disease in this active      smoker.  2. Acute bronchitis causing an exacerbation with bronchospasm.  3. Coronary artery disease status post stents, likely diastolic      dysfunction.  No evidence of fresh ischemia.  4. Hypertension.  5. Diabetes.   RECOMMEND:  1. I agree with continuing a course of steroids.  If discharge is      planned, 60 mg of prednisone can be continued.  I will try to see      her as an outpatient within the next 4-5 days and decide to taper      this depending on her response.  2. P.o. Avelox for 7 days.  3. Her current bronchodilator regimen seems to be adequate with      Spiriva HandiHaler daily and albuterol nebs as needed.  I have      asked her to increase her Advair 250/50 to b.i.d. dosing.  4. PFTs will be estimated as outpatient once her bronchospasm has      resolved.  5. Smoking cessation was again encouraged.  She has quit for a few      days with nicotine patches and Wellbutrin, and we can continue this      and pursue this as an outpatient.   I have arranged a followup with me on Friday, September 19, at 2:10 p.m.   Thank you, Dr. Daleen Squibb, for the opportunity of evaluating your patient.  We  will assist with further management as an outpatient.      Oretha Milch, MD  Electronically Signed     RVA/MEDQ  D:  07/31/2007  T:  07/31/2007  Job:  [id number]   cc:   [name], M.D.

## 2011-03-30 NOTE — Consult Note (Signed)
NAME:  Madison Henson, Madison Henson NO.:  [id number]   MEDICAL RECORD NO.:  [id number]          PATIENT TYPE:  INP   LOCATION:  A215                          FACILITY:  [facility]   PHYSICIAN:  Kassie Mends, M.D.      DATE OF BIRTH:  [birth year]   DATE OF CONSULTATION:  03/22/2007  DATE OF DISCHARGE:                                 CONSULTATION   REASON FOR CONSULTATION:  GI bleed.   HISTORY OF PRESENT ILLNESS:  Madison Henson is a 77 year old female who was  admitted with abdominal pain.  She states she has had black watery  stools for 5-6 days.  She denies any use of aspirin, B. C. Powder's,  Goody's, ibuprofen or Aleve.  She denies the use of Pepto-Bismol or  iron.  She is on Plavix for an unknown period of time.  She has a  history of coronary artery disease and had 4 stents placed between 2004  and 2005.  She also complains of lower abdominal pain for the last 2-3  weeks.  She reports being treated for an urinary tract infection with  possibly Pyridium and an antibiotic, which she takes 2 times a day.  She  is unsure of the name of the antibiotic.  She denies any problems  swallowing.  She rarely has heartburn or indigestion.  She states she  has been seeing some bright red blood from her rectum.  She complains of  lower abdominal pain after she eats.  The pain begins approximately 45  minutes after she eats; she describes it as a cramping.  When the  cramping occurs whatever she has eaten comes right up.  She has also had  some nausea associated with this lower abdominal cramping.  She reports  feeling short of breath once in a while.  She sleeps on 2 pillows at  home.  She denies any vomiting.  She reports having an upper endoscopy  in January of 2008 and states she was told her esophagus was all messed  up.  Her upper endoscopy was performed in [place].  Her last colonoscopy  was in 2000 and she reports having polyps.   PAST MEDICAL HISTORY:  1. Diabetes.  2. Emphysema.  3.  Hypothyroidism.  4. Hyperlipidemia.   PAST SURGICAL HISTORY:  1. Probable hysterectomy.  2. Cholecystectomy with gallstones.  3. Kidney stone surgery.  4. Neck surgery.   ALLERGIES:  SULFA.   HOME MEDICATIONS:  1. Reglan before meals and at bedtime.  2. Caduet.  3. Plavix.  4. Synthroid.  5. Lisinopril.   HOSPITAL MEDICATIONS:  1. Norvasc.  2. NovoLog sliding scale.  3. Synthroid.  4. Protonix 40 mg b.i.d.  5. Zocor.   FAMILY HISTORY:  She has a sister who had colon cancer at age 58 or 85.  Her daughter had polyps.  She has a family history of prostate cancer.   SOCIAL HISTORY:  She never drank any alcohol.  She lives with her  daughter.  She smokes 1-2 packs of cigarettes a day.   REVIEW OF SYSTEMS:  As per HPI; otherwise, all  systems are negative.   PHYSICAL EXAMINATION:  VITAL SIGNS:  Temperature is 97.5, blood pressure  is 122/64, pulse is 74 and respiratory rate is 20. GENERAL:  She is in  no apparent distress, alert and oriented times four.HEENT:  Atraumatic,  normocephalic.  Pupils equal, round and reactive to light.  Mouth with  no oral lesions.  Posterior oropharynx without erythema or exudate.  LUNGS:  Clear to auscultation bilaterally.CARDIOVASCULAR:  Regular  rhythm; no murmurs.  Normal S1 and S2.  ABDOMEN:  Bowel sounds present.  Soft.  Nondistended.  Moderate  tenderness to palpation in bilateral lower quadrants with mild rebound  without guarding.  She has no pulsatile masses or abdominal  bruits.EXTREMITIES:  Without cyanosis, clubbing or edema.NEUROLOGIC:  She has no focal neurologic deficits.   LABORATORY DATA:  White count is 9.4, hemoglobin is 12.2 and platelets  are 237.  BUN is 8 and creatinine is 0.69.  Albumin is 3.6.  Troponin is  0.02.   ASSESSMENT:  Madison Henson is a 77 year old female admitted with reported  melena and lower abdominal pain.  Differential diagnoses includes an  upper gastrointestinal source such as peptic ulcer disease  or  esophagitis.  It also includes a right-sided colonic lesion such as  ischemic colitis, a cecal arterial venous malformation, or a  diverticular bleed.  Thank you for allowing me to see Madison Henson in consultation.  My  recommendations follow.   RECOMMENDATIONS:  1. Will plan an EGD on Mar 23, 2007, if her cardiac enzymes are      negative.  2. If no source for her reported black tarry stools is found, then      would consider a colonoscopy.  No polypectomy can be performed      because her Plavix has not been held for 5 days.  I will explain      this to Madison Henson.  She      may elect to wait for 5 days to have her colonoscopy performed.  3. Agree with transfusion as needed.  4. Continue Protonix.      Kassie Mends, M.D.  Electronically Signed     SM/MEDQ  D:  03/22/2007  T:  03/22/2007  Job:  [id number]   cc:   [name], M.D.  Fax: [phone]

## 2011-03-30 NOTE — Op Note (Signed)
NAME:  Madison Henson, Madison Henson NO.:  [id number]   MEDICAL RECORD NO.:  [id number]          PATIENT TYPE:  AMB   LOCATION:  DAY                           FACILITY:  [facility]   PHYSICIAN:  Kassie Mends, M.D.      DATE OF BIRTH:  [birth year]   DATE OF PROCEDURE:  07/24/2008  DATE OF DISCHARGE:                               OPERATIVE REPORT   REFERRING PHYSICIAN:  [name], MD   PROCEDURE:  Esophagogastroduodenoscopy with Bravo capsule placement and  cold forceps biopsy.   INDICATIONS FOR EXAM:  Madison Henson is a 77 year old female who had a  endoscopy in May 2008, which showed no evidence of stricture or ring.  She continues to complain of solid and liquid dysphagia, which is  associated with uncontrolled symptoms of gastroesophageal reflux  disease.   FINDINGS:  1. Normal esophagus without evidence of Barrett's mass, erosion,      ulceration, or stricture.  Bravo capsule placed 38 cm from the      teeth.  The GE junction was located 44 cm from the teeth.  2. Patchy erythema in the antrum with erosions.  Biopsies obtained via      cold forceps to evaluate for H. pylori gastritis.  3. Patchy erythema in the duodenal bulb and the second portion of the      duodenum.  Single duodenal diverticulum.   DIAGNOSES:  1. Gastritis.  2. Duodenitis.   RECOMMENDATIONS:  1. Bravo Capsule study will be returned on Friday and will be read.  2. She may continue her aspirin and Plavix.  3. We will call Madison Henson with results of her biopsies.  4. She should continue on the Nexium b.i.d.  5. Followup appointment in 6 weeks' with Dr. Cira Servant regarding her      dysphagia.   MEDICATIONS:  1. Demerol 75 mg IV.  2. Versed 4 mg IV.   PROCEDURE TECHNIQUE:  Physical exam was performed.  Informed consent was  obtained from the patient after explaining the benefits, risks, and  alternatives to the procedure.  The patient was connected to the monitor  and placed in left lateral position.   Continuous oxygen was provided via  nasal cannula and IV medicine administered through an indwelling  cannula.  After administration of sedation, the patient's esophagus was  intubated and the scope was advanced under direct visualization to the  second portion of the duodenum.  The scope was removed slowly by  carefully examining the color, texture, anatomy, and integrity of the  mucosa on the way out.  The GE junction was identified at 44 cm from the  teeth.  The Bravo capsule was introduced into the distal esophagus 38 cm  from the teeth.  A suction was applied for 1 minute.  The capsule was  released and the patient's esophagus was intubated with a diagnostic  gastroscope.  It was advanced into the distal esophagus to confirm  placement on the sidewall of the esophagus.  This scope was removed as  well as the introducer.  The patient was recovered in endoscopy and  discharged  home in satisfactory condition.   PATH:  Mild gastritis      Kassie Mends, M.D.  Electronically Signed     SM/MEDQ  D:  07/24/2008  T:  07/25/2008  Job:  [id number]   cc:   [name], M.D.  Fax: [phone]

## 2011-03-30 NOTE — Cardiovascular Report (Signed)
NAME:  Madison Henson, Madison Henson NO.:  [id number]   MEDICAL RECORD NO.:  [id number]          PATIENT TYPE:  OIB   LOCATION:  1961                         FACILITY:  [facility]   PHYSICIAN:  Everardo Beals. Juanda Chance, MD, FACCDATE OF BIRTH:  1928/07/08   DATE OF PROCEDURE:  05/21/2009  DATE OF DISCHARGE:                            CARDIAC CATHETERIZATION   CLINICAL HISTORY:  Ms. Hustead is a 77 year old.  She has had previous  non-overlapping stents placed in the mid circumflex and ostial  circumflex artery.  She had a Cypher stent placed in the proximal to mid  right coronary artery.  In August 2008, she had a DMI due to stent  thrombosis of the Cypher stent and had a new Promus stent placed distal  to the Cypher stent.  Recently, she has had episodes of epigastric pain.  These were suggestive of ischemia.  We did a dobutamine Myoview scan and  she developed ventricular tachycardia, but had minimal inferior basal  scar with ischemia.  Based on these findings, we brought her in for  angiography.  She did develop nausea and chest pressure during the  dobutamine infusion with the ventricular tachycardia.   RESULTS:  The aortic pressure was 127/60 with a mean of 86, and the left  ventricular pressure was 127/25.   Left main coronary artery:  The left main coronary artery was free of  significant disease.   Left anterior descending artery:  The left anterior descending artery  gave rise to a early large diagonal branch and several septal  perforators.  There was an area of hypodensity in the ostium of the LAD,  which I think was related to the stent struts in the circumflex artery,  extending slightly into the left main LAD with laminar flow.  There was  in general about a 30% ostial narrowing in the LAD.   Circumflex artery:  The circumflex artery gave rise to a small ramus  branch and a posterolateral branch.  There was less than 10% stenosis in  the stent in the ostium of circumflex  artery and in the mid circumflex  artery.  There was also some hypodensity right at the edge of the  circumflex stent, but this filled completely on some views and I think  represented also probably non-laminar flow because of tension of the  stent struts into the left main.   Right coronary artery:  The right coronary artery was a dominant vessel  that gave rise to conus branch, 2 left right ventricular branch, the  posterior descending branch, and 2 small posterolateral branches.  There  was less than 10% narrowing at the overlapping Cypher and Promus stents  in the proximal and mid LAD.  There was 30% narrowing proximal to the  stent in the proximal right coronary artery.   Left ventriculogram:  The left ventriculogram performed in the RAO  projection showed minimal hypokinesis of the inferobasal segment.  The  overall wall motion was very good with an estimated ejection fraction of  60%.   CONCLUSION:  Nonobstructive coronary artery disease with 30% narrowing  in the ostium  of LAD, less than 10% narrowing at the stent sites in the  ostium and mid circumflex artery, and less than 10% narrowing at the  overlapping Cypher and Promus stents in the proximal and mid right  coronary artery with minimal inferobasal wall hypokinesis and an  estimated ejection fraction of 60%.   RECOMMENDATIONS:  Based on these findings, I do not find any source of  ischemia.  It is possible she could be having ventricular arrhythmias  that might account for her symptoms.  We will consider event monitor  after further discussions with her if her symptoms are suggestive of  this.      Bruce Elvera Lennox Juanda Chance, MD, [facility]  Electronically Signed     BRB/MEDQ  D:  05/21/2009  T:  05/21/2009  Job:  [id number]   cc:   [name], M.D.

## 2011-03-30 NOTE — Discharge Summary (Signed)
NAMEMarland Kitchen  Madison Henson, Madison NO.:  [id number]   MEDICAL RECORD NO.:  [id number]          PATIENT TYPE:  INP   LOCATION:  2034                         FACILITY:  [facility]   PHYSICIAN:  Everardo Beals. Juanda Chance, MD, FACCDATE OF BIRTH:  Dec 27, 1927   DATE OF ADMISSION:  06/23/2007  DATE OF DISCHARGE:  06/27/2007                               DISCHARGE SUMMARY   PRIMARY CARDIOLOGIST:  Dr. Charlies Constable.   PRIMARY CARE PHYSICIAN:  Dr. [name] in [place], [place].   DISCHARGING DIAGNOSES:  1. Coronary artery disease/ST elevated myocardial infarction this      admission status post cardiac catheterization on June 23, 2007 by      Dr. Charlies Constable.  The patient with stent thrombosis of previously      placed Cypher stent in the proximal mid RCA with total occlusion at      the stent site in the right coronary, less than 10% stenosis at the      two Cypher stents in the circumflex artery, and irregular disease      in the LAD.  The patient underwent successful reperfusion balloon      dilatation and placement of a new PROMUS drug-eluting stent      overlapping the previous Cypher stent with some improvement in      narrowing from 100 to 10% to the proximal mid RCA at the distal      edge of the previously placed stent.  2. Recent exacerbation of chronic obstructive pulmonary      disease/bronchitis.  3. The patient recently discontinued Plavix.  4. Diabetes.  Past medical history includes diabetes.  5. Hypertension.  6. Hyperlipidemia.  7. Hypothyroidism.  8. Chronic obstructive pulmonary disease.  9. History of kidney stones.  10.Hysterectomy.  11.Cholecystectomy.  12.Coronary artery disease status post cardiac catheterizations in      2003.      a.     Cypher stents x2 to the circumflex.      b.     2005 right coronary artery Cypher stent placed.   HOSPITAL COURSE:  Ms. Henson is a 77 year old Caucasian female with  known history of coronary artery disease status post  interventions in  2003 and 2005 previously followed by physicians at [facility] in  [place].  A week ago the patient had developed a respiratory  infection which was treated with antibiotics.  At that time she stopped  her Plavix for bronchitis.  On the morning of this admission the patient  developed severe chest pain.  EMS was called.  EMS on arrival to  patient's home did an EKG that showed inferior MI.  Code STEMI was  called on arrival to [facility].  The patient was brought directly to  the cardiac catheterization lab for further evaluation by Dr. Juanda Chance.  On arrival to the cath lab the patient was given Angiomax bolus and then  600 mg loaded with Plavix, 4 chewable baby aspirin.  Dr. Juanda Chance was able  to cross the totally occluded stent in the proximal to mid vessel with a  Prowater wire without difficulty.  He attempted to perform an aspiration  thrombectomy, but was unable to pass the catheter beyond the proximal  edge of the stent. He then performed intravascular ultrasound.  The  patient was found to have significant disease in the distal edge of the  stent and some mild to moderate disease at the proximal edge of the  stent.  He then deployed a PROMUS stent at the distal edge of the  previous stent (Cypher).  The patient's ejection fraction estimated at  20%.  Left ventriculogram showed inferior wall akinesis, apex and  anterior lateral wall moved well.  The patient tolerated the procedure  without complications and was transferred to CCU where she remained in  stable condition.  On the 9th blood pressure mildly hypotensive at  95/45, troponin peaked at 29.9, heart rate in the 50s, mildly  hypokalemic, H&H stable 11 and 33.  The patient with some respiratory  distress, wheezing, and rhonchi.  Zithromax initiated, no beta blocker  secondary to bronchospasms.  ACE inhibitor was held secondary to  hypotension.  Cardiac rehabilitation in to educate patient.  Smoking   cessation also obtained with patient willing to discontinue tobacco  products at this time.  On the 10th the patient continued to remain  stable.  Transferred to telemetry unit.  Troponin beginning to trend  downward.  Kidney function stable with BUN 16, creatinine 0.73, H&H  stable at 11.3 and 33.2.  The patient remained in normal sinus rhythm.  The patient also evaluated by [facility] with  participation in the ADAPT-DES Study.  Dr. Juanda Chance in to see patient on  the day of discharge, the patient feeling much better.  Started on 20 mg  of Lasix daily.  At time of discharge vital signs stable.  Patient being  discharged home with a follow-up appointment with  Dr. Juanda Chance on August 22 at 10:15.   DISCHARGE MEDICATIONS:  At time of discharge her medications include  aspirin 325, Plavix 75 levothyroxine 125 mcg daily, Reglan 10 mg q.i.d.,  Lipitor 80 mg daily, Januvia/metformin.  The patient on 50/500 mg daily.  The patient resumed this on June 28, 2007.  Albuterol and Atrovent  inhalers q.i.d., nitroglycerin p.r.n., furosemide 20 mg daily, Zithromax  250 mg daily x3 more days.  Lisinopril has been resumed at 10 mg once a  day.  No beta blocker secondary to bronchospasms.  The patient has been  given prescriptions for Plavix, Lipitor, albuterol/Atrovent,  nitroglycerin, furosemide, Zithromax, and lisinopril.  She has been  instructed to discontinue her Caduet.   She has been given post cardiac catheterization discharge instructions,  the post myocardial infarction contract along with smoking cessation  article.  She knows to call our office for any problems from her  catheterization site prior to her follow-up appointment with Dr. Juanda Chance.  Duration of discharge encounter is over 30 minutes.      Dorian Pod, ACNP      Bruce R. Juanda Chance, MD, [facility]  Electronically Signed    MB/MEDQ  D:  06/27/2007  T:  06/28/2007  Job:  [id number]   cc:   [name], M.D.

## 2011-03-30 NOTE — H&P (Signed)
NAME:  Madison Henson, Madison Henson NO.:  [id number]   MEDICAL RECORD NO.:  [id number]          PATIENT TYPE:  INP   LOCATION:  A215                          FACILITY:  [facility]   PHYSICIAN:  [name], M.D.        DATE OF BIRTH:  1928-07-05   DATE OF ADMISSION:  03/22/2007  DATE OF DISCHARGE:  LH                              HISTORY & PHYSICAL   CHIEF COMPLAINT:  Lower and upper abdominal pain and weakness.   HISTORY OF PRESENT ILLNESS:  Madison Henson is a pleasant 77 year old white  female who I have only seen two times in the last month who apparently  was being followed and seen by a doctor in [place], who apparently 1 week  ago started having some lower abdominal pain and also had some upper  abdominal pain shortly thereafter and stated that every time she ate  something she would have a significant amount of diarrhea approximately  30 minutes after eating.  She noted that her stools were water-like and  dark black in nature.  She has continued to have weakness and feel more  fatigued.  In reviewing her chart, minimal past medical history is  available from her previous doctors, but apparently has had EGD and  workup before by Dr. Linna Darner in [place], but this has been several years ago  and was told that she had a hiatal hernia and started on some PPIs this  at that time.  She is on Plavix due to heart stents, and this may be  contributing to possible bleeding.   PAST MEDICAL HISTORY:  1. Hypothyroidism.  2. Hypertension.  3. Diabetes mellitus type 2 diabetes.  4. Hyperlipidemia.  5. History of kidney stones before.  6. COPD.   PAST SURGICAL HISTORY:  1. Had heart stents placed in 2004 and 2005, followed by Dr. Loletta Specter      at [facility].  2. Hysterectomy at age 77.  3. Cholecystectomy done in 30s.  4. Had a bladder tack done in the 1980s, as well.   MEDICATIONS:  1. Plavix 75 mg p.o. daily.  2. Lisinopril 10 mg p.o. daily.  3. Synthroid 125 mcg p.o. daily.  4.  Janumet 50/500 q.12h.  5. Metoclopramide 10 mg q.a.c. and q.h.s. before meals.  6. Caduet 5/20 p.o. daily.  7. Proventil inhaler q.6h. one to two puffs q.6h. p.r.n. shortness of      breath.  8. Chantix (it is unclear if she is still taking this medicine this      time).  9. Lac-Hydrin lotion for her skin.  10.She just finished a course of nitrofurantoin for a urinary tract      infection.  11.She was also given samples of Detrol LA (unclear if she is still      taking that).   ALLERGIES:  SULFA causes rash.   SOCIAL HISTORY:  She is widowed, retired.  She did smoke significantly  for greater than 20 years, but apparently is down to 2 cigarettes a day.  No alcohol or drugs.   FAMILY HISTORY:  Father is deceased, unknown  age; had arthritis.  Mother  is deceased; had some heart problems, MI at age 43, hypertension.  One  sister has hypertension.  Apparently, three brothers had cancer and one  sister had cancer of unknown type.  She has one child.   REVIEW OF SYSTEMS:  As mentioned above, abdominal pain.  Denies any  specific chest pain, but does have what appears to be some orthopnea  when lying down just at night.  She denies any significant swelling at  this time.  She does mention occasional swelling of the lower  extremities.  No neurologic problems.  No specific nausea and vomiting.  She states that she has had a 14 pound weight loss over the last 2  weeks.  Unclear exactly how she measured this.  All other review of  systems are negative.   OBJECTIVE:  VITAL SIGNS:  Temperature was afebrile.  Blood pressure in  the office was 142/78, pulse of 95, satting 97% on room air.  GENERAL:  This is an elderly white female, assessed and seen in the  office, in no acute distress.  HEENT:  Unremarkable.  Some missing dentition.  Did not appreciate any  JVD.  No thyromegaly.  LUNGS:  Good air movement day.  No crackles appreciated.  ABDOMEN:  Soft.  She does have some left upper  quadrant pain to  palpation, as well as right lower abdominal pain, as well, to palpation.  I do not appreciate any masses at this time.  Positive bowel sounds  heard throughout.  CARDIOVASCULAR:  Regular rate and rhythm.  No murmurs, gallops or rubs  heard.  EXTREMITIES:  No lower extremity edema.  Palpable pulses in all  extremities.  NEUROLOGIC:  The patient is alert and oriented x3.  Did not appreciate  any deficits.  Cranial nerves II-XII intact.   LABORATORY DATA:  All lab work is pending at this time.   CT AND CHEST X-RAY:  Also pending.   IMPRESSION:  This is a 77 year old white female with apparent GI bleed  that and considerable weakness admitted to the hospital for further  evaluation and workup.   ASSESSMENT/PLAN:  1. Gastrointestinal bleed.  We will check a CBC on her.  Did not have      a baseline CBC to compare to at this time.  Given her history, it      sounds like this could be an upper GI bleed.  Was on a PPI at one      time, but unclear why she is not on one at this time.  She had had      some apparent gastric emptying problems and was started on      metoclopramide which she states has helped with the nausea and      chronic abdominal pain previously.  Will get gastroenterology to      see the patient again.  She very likely will need an EGD, as well      as probable colonoscopy.  Will not resume her Plavix at this time.      She has been more than 3 years out since stents have been placed,      although I think she may benefit from staying on Plavix following      this incident.  Will hold for now with minimal risk for reocclusion      of the stents this far out.  2. Diabetes.  Will continue to check and follow with sliding scale  insulin.  Last A1c is 5.8, so relatively under good control.  3. Hypothyroidism.  She has had TSH and free T4 checked which were      within normal limits.  DISPOSITION:  The patient will be admitted to telemetry and get  full  evaluation including CT scan for possible question of colitis versus  ulceration.  Will see if anything shows up on this.      [name], M.D.  Electronically Signed     ZH/MEDQ  D:  03/22/2007  T:  03/22/2007  Job:  [id number]

## 2011-03-30 NOTE — Op Note (Signed)
NAME:  EVETTE, DICLEMENTE NO.:  [id number]   MEDICAL RECORD NO.:  [id number]          PATIENT TYPE:  AMB   LOCATION:  DAY                           FACILITY:  [facility]   PHYSICIAN:  Kassie Mends, M.D.      DATE OF BIRTH:  08/03/1928   DATE OF PROCEDURE:  07/24/2008  DATE OF DISCHARGE:  07/24/2008                               OPERATIVE REPORT   PROCEDURE:  Bravo capsule study, 48 hr study.   REFERRING PHYSICIAN:  Dwana Melena.   INDICATION FOR EXAMINATION:  Ms. Sanluis is a 77 year old female who  complains of solid and liquid dysphagia which is associated with  uncontrolled symptoms of gastroesophageal reflux disease while taking  Nexium on Nexium twice daily.  She has continued on aspirin and Plavix  for coronary artery disease.  She had stent thrombosis of her [place]  stent in August 2008.  Dr. Juanda Chance asked that the procedure be performed  on the aspirin and Plavix due to her high risk for stent thrombosis.   FINDINGS:  On day 1, the study period was 21 hours and 43 minutes.  She  had 4 episodes of reflux and only 1 minute in which the pH of her  esophagus was less than 4.  Her DeMeester score on day 1 was 0.7.  She  reports taking Nexium only after the capsule was placed.  She has not  taken any Reglan due to the fact that causes her to be jittery and have  the shakes.  On day 2, the study period lasted 22 hours and 44 minutes.  She had 16 episodes of reflux.  Four episodes lasted greater than 5  minutes.  The longest duration of reflux was 50 minutes.  She spent 109  minutes at pH less than 4.  Her DeMeester score on day 2 was 32.  She  reports taking Nexium 1 in the morning and 1 at night.  Her symptom  association probability was 98.6 for regurgitation.   DIAGNOSIS:  Ms. Yip has evidence of uncontrolled gastroesophageal  reflux disease which is intermittent.  She does take Nexium twice daily.  She had a nuclear medicine scan on August 31 which showed delayed  gastric emptying.  Seventy percent of the radiotracer remained within  the stomach after 2 hours.  Her GERD may be uncontrolled due to her  gastroparesis which is not being adequately managed because she cannot  take Reglan.   RECOMMENDATIONS:  She states she will not be able to afford domperidone.  Will add erythromycin 250 mg 30 minutes before breakfast and dinner.  She will see Dr. Franky Macho to consider a Nissen fundoplication.  I  did discuss the medication side effects with Ms. Stumpp.  She will have  a return patient visit to see me in 1 month.      Kassie Mends, M.D.  Electronically Signed     SM/MEDQ  D:  08/01/2008  T:  08/01/2008  Job:  [id number]   cc:   [name], M.D.  Fax: [phone]

## 2011-03-30 NOTE — Discharge Summary (Signed)
NAME:  Madison Henson, Madison Henson NO.:  [id number]   MEDICAL RECORD NO.:  [id number]          PATIENT TYPE:  INP   LOCATION:  2006                         FACILITY:  [facility]   PHYSICIAN:  Rollene Rotunda, MD, FACCDATE OF BIRTH:  27-May-1928   DATE OF ADMISSION:  06/29/2007  DATE OF DISCHARGE:  07/02/2007                               DISCHARGE SUMMARY   DISCHARGING PHYSICIAN:  Rollene Rotunda, MD.   PRIMARY CARDIOLOGIST:  Everardo Beals. Juanda Chance, MD, previously Dr. Jacqualyn Posey at  [facility] in [place].   PRIMARY CARE:  [name], M.D.   DISCHARGING DIAGNOSIS:  1. Coronary artery disease status post cardiac catheterization this      admission by Dr. Charlies Constable on August 15 that showed no evidence      of recurrence at her stent sites.  Dr. Juanda Chance felt the patient's      chest pain was noncardiac.  He did note an elevation of her left      ventricular filling pressures with plan to increase her diuretic      slightly.  2. Exacerbation of chronic obstructive pulmonary disease with      pulmonary toileting this admission  3. Ongoing tobacco abuse.  4. Anemia.   PAST MEDICAL HISTORY INCLUDES:  1. Coronary artery disease.  Ms. Madison Henson was just here on August 8,      status post cardiac catheterization, at that time, secondary to ST      elevated MI with successful reperfusion, balloon dilatation, and      placement of a new promise drug-eluting stent overlapping the      previously placed Cypher stents with improvement in narrowing from      10-100% improving the flow from TIMI 0 TIMI 3 flow.  2. Hypothyroidism.  3. COPD.  4. Kidney stones.  5. Status post hysterectomy and cholecystectomy.  6. Hypertension.  7. Hyperlipidemia.  8. Type 2 diabetes.  9. Ongoing tobacco abuse.  10.Ischemic cardiomyopathy with an EF of 20% previously; now, improved      to 60% with slight inferior wall hypokinesis (status post this      cardiac catheterization this admission).   HOSPITAL COURSE:  Ms. Madison Henson was discharged home on August 12, status  post ST elevated MI with placement of the Promus drug-eluting stent, as  stated above.  Returned this admission complaining of chest discomfort  that developed acutely several days after being discharged. she took  nitroglycerin without relief and presented to her primary care  physician's office where EKG was performed.  Our office was apparently  contacted; and the patient was advised to present to [facility]  Emergency Room via EMS.  She continued to have intermittent low level of  chest discomfort without associated symptoms.  EKG showed no acute  changes.  Cardiac enzymes elevated, however, this appeared to be  trending down from the patient's previous STEMI a week earlier.  It was  decided to reevaluate the patient's coronary anatomy.  She was taken  back to the cath lab on August 15 by Dr. Juanda Chance.  Results as stated  above  the patient tolerated procedure without complications.  Also  underwent a 2-D echocardiogram that showed improvement in the EF from  previous 20% at time of MI to 55% at this time.  The patient was also  treated with steroid therapy antibiotics secondary to COPD exacerbation.  Dr. Antoine Poche in to see the patient on day of discharge.  The patient  stated improvement in symptoms feels ready to be discharged home.  She  has been given the post cardiac catheterization.   DISCHARGE INSTRUCTIONS:  She is to follow up with Dr. Juanda Chance as  previously scheduled for August 22.   MEDICATIONS AT TIME OF DISCHARGE INCLUDE:  1. Spiriva inhaler daily.  2. Plavix 75 daily.  3. Synthroid of 125 mcg daily.  4. Reglan 10 mg q.i.d.  5. Lipitor 80 daily.  6. Lisinopril 10 mg daily.  7. Januvia and metformin 50/500 resume on July 03, 2007.  8. Aspirin 325.  9. Furosemide 40 mg daily.  10.Albuterol/Atrovent q.i.d.  11.Guaifenesin 1200 mg p.o. b.i.d. x1 1 week.  12.Nexium 40 mg daily.  13.Doxycycline 100 mg  p.o. b.i.d. x10 days.  14.Prednisone taper 60 mg x2 more days, 40 mg x3 days, 20 mg x3 days,      10 mg x3 days; and then discontinue.   FOLLOWUP:  The patient to follow up with [name] for routine primary  care needs.  Once, again, smoking cessation is strongly encouraged.   DURATION OF DISCHARGE ENCOUNTER:  Greater than 30 minutes      Dorian Pod, ACNP      Rollene Rotunda, MD, [facility]  Electronically Signed    MB/MEDQ  D:  07/02/2007  T:  07/03/2007  Job:  [id number]

## 2011-03-30 NOTE — Discharge Summary (Signed)
NAME:  Madison Henson, Madison Henson NO.:  [id number]   MEDICAL RECORD NO.:  [id number]          PATIENT TYPE:  INP   LOCATION:  4703                         FACILITY:  [facility]   PHYSICIAN:  Jesse Sans. Wall, MD, FACCDATE OF BIRTH:  May 09, 1928   DATE OF ADMISSION:  07/27/2007  DATE OF DISCHARGE:  07/31/2007                               DISCHARGE SUMMARY   PROCEDURES:  One CT angiogram of the chest.   PRIMARY FINAL DISCHARGE DIAGNOSES:  Chronic obstructive pulmonary  disease exacerbation/acute tracheobronchitis.   SECONDARY DIAGNOSES:  1. Non-cardiac chest pain.  2. Status post inferior MI August 8th, with a PROMA stent to the RCA      for in-stent thrombosis and distal plaque.  3. Chest pain June 30, 2007, status post cardiac catheterization      showing LAD 15%, circumflex 10%, RCA with no in-stent re-stenosis      and an EF of 60%.  4. Diabetes.  5. Hypertension.  6. Hyperlipidemia.  7. Status post Cypher stent to the circumflex and RCA at [facility].  8. Chronic obstructive pulmonary disease.  9. Gastroesophageal reflux disease.  10.Nephrolithiasis.  11.Hypothyroid.  12.Status post hysterectomy, cholecystectomy, bladder tack, EGD,      colonoscopy and neck surgery.   FAMILY HISTORY:  1. Coronary artery disease in her mother.  2. Allergy or intolerance to sulfa.   Time at discharge:  42 minutes.   HOSPITAL COURSE:  Ms. Jasmin is a 77 year old female with known  coronary artery disease.  She came to the hospital on the day of  admission with a several day history of dizziness, mid-scapular chest  pain that was going through to her chest, as well as tremors and  increased wheezing and cough.  She was admitted for further evaluation.   A CT angiogram of the chest was negative for PE or other structural  cause of chest pain.  Her cardiac enzymes were negative for MI.  Because  of her most recent cardiac catheterization August 15, it was not felt  that  repeat cardiac catheterization was indicated, as her cardiac  enzymes and EKG were within normal limits.  She was coughing, and the  cough was productive with a greenish sputum, so she was started on  antibiotics.  A smoking cessation consult was called, and she was  started on Wellbutrin, as well as a nicotine patch.   A pulmonary consult was called on July 31, 2007.  She was seen by  Dr. Vassie Loll, who felt that she should be continued on the steroids which  had been started, and he will taper dose as an outpatient.  He added  Advair to her medication regimen and changed her antibiotic to Avelox.  He felt that she could obtain PFTs as an outpatient.  He will follow her  closely.  Her chest pain had improved, and her other symptoms had  improved as well.  Of note, a TSH was checked this admission and was  within normal limits.  She was evaluated by Dr. Daleen Squibb and considered  stable for discharge on  July 31, 2007 with outpatient follow-up  arranged.   DISCHARGE INSTRUCTIONS:  Her activity level is to be increased  gradually.  She is to follow up with Dr. Juanda Chance on October 1st at 11:30  and with Dr. Vassie Loll on September 19 at 210.  She is to follow up with Dr.  Margo Aye, as needed.  She is to stick to a low-sodium diabetic diet.  She is  not to use tobacco.  The importance of not using tobacco while wearing a  nicotine patch was emphasized, and she indicated understanding.   DISCHARGE MEDICATIONS:  1. Spiriva 1 puff daily.  2. Advair 250/50, one puff b.i.d.  3. Albuterol nebulizers q.i.d. p.r.n.  4. Nicotine patch 14 mg daily, decrease to 7 mg in 1 week and stop in      2 weeks.  5. Lipitor 480 mg daily until completed, and then changed to Zocor 40      mg q. day per Dr. Regino Schultze instructions.  6. Janumet 50/500 b.i.d.  7. Aspirin 325 mg daily.  8. Plavix 75 mg a day.  9. Lisinopril 10 mg a day.  10.Reglan 10 mg q.a.c. and  h.s.  11.Lasix 40 mg a day.  12.Synthroid 125 mcg daily.   13.Protonix 40 mg a day.  14.Wellbutrin SR 150 mg b.i.d.  15.Avelox 400 mg daily.  16.Prednisone 20 mg 3 tablets daily and taper as directed by Dr. Vassie Loll.      Theodore Demark, PA-C      Jesse Sans. Daleen Squibb, MD, [facility]  Electronically Signed    RB/MEDQ  D:  07/31/2007  T:  07/31/2007  Job:  [id number]   cc:   Oretha Milch, MD  [name], M.D.

## 2011-03-30 NOTE — Group Therapy Note (Signed)
NAME:  DEANETTE, TULLIUS NO.:  [id number]   MEDICAL RECORD NO.:  [id number]          PATIENT TYPE:  INP   LOCATION:  A215                          FACILITY:  [facility]   PHYSICIAN:  Angus G. Renard Matter, MD   DATE OF BIRTH:  01-14-1928   DATE OF PROCEDURE:  DATE OF DISCHARGE:                                 PROGRESS NOTE   This patient admitted with black watery stools over a period of several  days with abdominal pain.  The patient recently has been treated for  urinary tract infection.  She is scheduled for EGD today to be followed  by colonoscopy.  Current hemoglobin 12.2.   PHYSICAL EXAMINATION:  VITAL SIGNS:  Blood pressure 111/60, respirations  18, pulse 66, temperature 97.5.  GENERAL:  The patient has generalized weakness.  HEART:  Regular rhythm.  LUNGS:  Clear to P&A.  ABDOMEN:  No palpable organs or masses.   ASSESSMENT:  Patient admitted with a history of abdominal pain and tarry  stools.   PLAN:  The plan is to proceed with upper and possibly lower endoscopy.  Continue current regimen.  Repeat hemoglobin/hematocrit.      Angus G. Renard Matter, MD  Electronically Signed     AGM/MEDQ  D:  03/23/2007  T:  03/23/2007  Job:  [id number]

## 2011-03-30 NOTE — Assessment & Plan Note (Signed)
[facility]                            CARDIOLOGY OFFICE NOTE   SHADY, PADRON                      MRN:          [id number]  DATE:07/03/2008                            DOB:          May 02, 1928    PRIMARY CARE PHYSICIAN:  [name], MD.   GASTROENTEROLOGIST:  Kassie Mends, MD in [place].   CLINICAL HISTORY:  Mrs. Madison Henson is 77 years old and returns for  management of her coronary heart disease.  She was hospitalized in  August 2008 with a diaphragmatic wall infarction to stent thrombosis  within a Cypher stent in the right coronary artery and had a new Promus  stent placed at the edge of the previous Cypher stent.  She also has  previous Cypher stents in the circumflex artery.  This stent thrombosis  occurred after she had been off her Plavix for several days.   She has done well from a standpoint of her heart since that time.  She  has had no recent chest pain, shortness breath, or palpitations.  She  has had difficulty with swallowing and symptoms of acid reflux and Dr.  Cira Servant in [place] indicated that she would like to do an endoscopy,  and she is here to help decide whether she could come off of Plavix for  that as well as for cardiac followup.   Her past medical history is significant for chronic obstructive  pulmonary disease, which has been followed by Dr. Vassie Loll.  Unfortunately,  she continues to smoke.  She also has hypertension, hyperlipidemia, and  diabetes.   Her current medications include,  Advair, Zocor 40 mg daily, Janumet,  aspirin which is on hold, Plavix, Lasix 40 mg daily, lisinopril 20 mg  daily, Norvasc 5 mg daily, Synthroid, and Cymbalta.   On examination, blood pressure 118/65 and pulse 76 and regular.  There  is no venous distention.  The carotid pulses were full without bruits.  Chest had decreased breath sounds and a few scattered wheezes.  Cardiac  rhythm was regular.  I could hear no murmurs or gallops.   The heart  sounds were decreased.  The abdomen was soft, normal bowel sounds.  There was no hepatosplenomegaly.  Peripheral pulses full with no  peripheral edema.   An echocardiogram was normal.   IMPRESSION:  1. Coronary artery disease status post diaphragmatic wall infarction      on June 23, 2007, due to stent thrombosis of a Cypher stent in the      right coronary treated with a new Promus stent with previous Cypher      stents in circumflex artery, now stable.  2. Good left ventricular function.  3. Chronic obstructive pulmonary disease with continued cigarette use.  4. Hypertension.  5. Hyperlipidemia.  6. Diabetes.  7. Gastroesophageal reflux disease and dysphagia.   RECOMMENDATIONS:  Ms. Battisti is at very high risk for repeat stent  thrombosis, and I think we should try and avoid discontinue Plavix.  I  have to have a call in to Dr. Cira Servant to discuss the issue with her.  Hopefully, she will be  willing to do the endoscopy while the patient  continues on Plavix.  Otherwise, I will plan to see her back in followup  in a year.     Bruce Elvera Lennox Juanda Chance, MD, [facility]  Electronically Signed    BRB/MedQ  DD: 07/03/2008  DT: 07/03/2008  Job #: [id number]

## 2011-03-30 NOTE — Op Note (Signed)
NAME:  Madison Henson, [facility] NO.:  [id number]   MEDICAL RECORD NO.:  [id number]          PATIENT TYPE:  AMB   LOCATION:  DAY                           FACILITY:  [facility]   PHYSICIAN:  Kassie Mends, M.D.      DATE OF BIRTH:  1927-12-30   DATE OF PROCEDURE:  04/06/2007  DATE OF DISCHARGE:                               OPERATIVE REPORT   REFERRING PHYSICIAN:  [name], M.D.   PROCEDURE:  Colonoscopy with cold forceps polypectomy and cold snare  polypectomy.   INDICATION FOR EXAM:  Ms. Strahm is a 77 year old female who presented  to the hospital complaining of black watery stool while being maintained  on Plavix for her coronary artery disease.  She had an upper endoscopy  which showed no source for her black watery stool.  She then had a  capsule endoscopy which showed no source for her black watery stool.  Her colonoscopy is being performed to further evaluate her complaint of  black watery stool.  Her hemoglobin at the time of her complaint of  black watery stool.   FINDINGS:  1. Extremely tortuous sigmoid colon which required a change from an      adult colonoscope to a pediatric colonoscope.  It took      approximately 30 minutes to maneuver through her sigmoid colon to      achieve successful intubation of her cecum.  2. Sigmoid colon diverticulosis.  3. Two 4-to-6-mm ascending colon polyps removed via cold forceps.  4. A 6-mm transverse colon polyp removed via cold snare.  5. Otherwise no masses, inflammatory changes or arteriovenous      malformations seen.  6. Small internal hemorrhoids, otherwise normal retroflexed view of      the rectum.   RECOMMENDATIONS:  1. No source for black watery stools seen.  It is possible that Ms.      Eblin had a Dieulafoy lesion or a slow diverticular bleed as the      source for her black watery stool.  2. Ms. Rish should follow a high fiber diet.  She is given a      handout on high-fiber diet, diverticulosis, and  polyps.  3. Will call her with the results of her biopsy report.  4. Screening colonoscopy in 5 years if she has an adenomatous polyp;      and she remains relatively healthy.Her children should have a      screening colonoscopy at age 77; and then every 10 years if she has      an adenomatous polyp because her polyp would be diagnosed after the      age of 29.  5. She seems to have some urinary complaints; and I asked her to      follow with Dr. [name], if those continue.  6. No NSAIDs, aspirin, or anticoagulation for 7 days.   MEDICATIONS:  1. Demerol 100 mg IV.  2. Versed 7 mg IV.   PROCEDURE TECHNIQUE:  Physical exam was performed; and an informed  consent was obtained from the patient after explaining the benefits,  risks, and alternatives to the procedure.  The patient was connected to  the monitor and placed in the left lateral position.  Continuous oxygen  was provided by nasal cannula, and IV medicine administered through an  indwelling cannula.  After administration of sedation and rectal exam,  the scope was advanced under direct visualization to the cecum.  The  scope was subsequently removed slowly, carefully examining the color,  texture, and anatomy and integrity mucosa on the way out.  The patient  was recovered in endoscopy and discharged home in satisfactory addition.      Kassie Mends, M.D.  Electronically Signed     SM/MEDQ  D:  04/06/2007  T:  04/06/2007  Job:  [id number]   cc:   [name], M.D.  Fax: [phone]

## 2011-03-30 NOTE — Assessment & Plan Note (Signed)
[facility]                            CARDIOLOGY OFFICE NOTE   NAME:Metheny, ABERDEEN HAFEN                      MRN:          [id number]  DATE:09/06/2007                            DOB:          10/09/1928    PRIMARY CARE PHYSICIAN:  Dr. [name]   PULMONOLOGIST:  Dr. Oretha Milch   CLINICAL HISTORY:  Ms. Ardito is 77 years old and returns for followup  management of her coronary artery disease and recent diaphragmatic wall  infarction. She was hospitalized on August 8th with diaphragmatic wall  infarction due to stent thrombosis in the proximal right coronary  artery. This was a Cypher stent and she had an edge stenosis.  We placed  a new Promus stent at the edge.  She also has Cypher stents in the  circumflex artery.   She has been doing fairly well. She has had some episodes of chest pain  but these have been non exertional and have been subxiphoid and shooting  up.  She has also had some feeling that her heart stops and she feels  sort of light headed and dizzy.   PAST MEDICAL HISTORY:  1. Significant for chronic obstructive pulmonary disease.  She has      been followed by Dr. Vassie Loll for this and he has worked on getting her      off of cigarettes.  She says she is currently smoking 7 cigarettes      a week.  2. Gastroesophageal reflux disease.  3. Hypertension.  4. Hyperlipidemia.  5. Type 2 diabetes.   CURRENT MEDICATIONS:  Include Spiriva, Advair, Lipitor, Zocor, Janumet,  aspirin, Plavix, lisinopril, Reglan, Lasix, Synthroid.   PHYSICAL EXAMINATION:  VITAL SIGNS:  Blood pressure is 157/81, pulse 68  and regular.  NECK:  There is no venous distention.  Carotids were full without  bruits.  CHEST:  Clear.  There were decreased breath sounds but no rales or  wheezes.  CARDIAC:  Rhythm was regular, the heart sounds were decreased.  ABDOMEN:  Soft with normal bowel sounds, there was no  hepatosplenomegaly.  EXTREMITIES:  Peripheral pulses  were full and there was no peripheral  edema.   IMPRESSION:  1. Coronary artery disease status post a recent diaphragmatic wall      infarction June 23, 2007 due to stent thrombosis of the right      coronary, treated with percutaneous coronary intervention and a new      Chromo stent with residual disease as described above.  2. Good left ventricular function.  3. Chronic obstructive pulmonary disease with continued cigarette use      but decreasing frequency.  4. Hypertension.  5. Hyperlipidemia.  6. Dizziness.  7. Diabetes.   RECOMMENDATIONS:  I think Ms. Dlugosz is doing fairly well from a  cardiac stand-point.  Her chest pain does not sound anginal to me.  It  sounds like she could be having some bradycardia but she is not on any  beta blockers at present.  I told her if she has any increased frequency  of these spells where she feels like  her heart stops and she gets light  headed, to let me know,or if she has any presyncopal episodes to let me  know.  Also if her chest pains get worse she is to let us know.  Otherwise I will see her back in 3 months.  Her blood pressure is  somewhat elevated and will increase her lisinopril from 10 to 20 a day  and get a BMP with Dr. Margo Aye in a week.     Bruce Elvera Lennox Juanda Chance, MD, [facility]  Electronically Signed    BRB/MedQ  DD: 09/06/2007  DT: 09/06/2007  Job #: [id number]   cc:   [name], M.D.  Oretha Milch, MD

## 2011-03-31 ENCOUNTER — Telehealth: Payer: Self-pay | Admitting: Cardiovascular Disease

## 2011-03-31 NOTE — Telephone Encounter (Signed)
LMTCB Unsure of who ordered PT.

## 2011-03-31 NOTE — Telephone Encounter (Signed)
Needs order for 2 X  week for 4 weeks for PT-pls call

## 2011-04-10 ENCOUNTER — Emergency Department (HOSPITAL_COMMUNITY)
Admission: EM | Admit: 2011-04-10 | Discharge: 2011-04-11 | Disposition: A | Discharge status: Home / Self Care | Payer: Medicare Other | Attending: Emergency Medicine | Admitting: Emergency Medicine

## 2011-04-10 DIAGNOSIS — R109 Unspecified abdominal pain: Secondary | ICD-10-CM | POA: Insufficient documentation

## 2011-04-10 DIAGNOSIS — N390 Urinary tract infection, site not specified: Secondary | ICD-10-CM | POA: Insufficient documentation

## 2011-04-10 DIAGNOSIS — R911 Solitary pulmonary nodule: Secondary | ICD-10-CM | POA: Insufficient documentation

## 2011-04-10 DIAGNOSIS — Z79899 Other long term (current) drug therapy: Secondary | ICD-10-CM | POA: Insufficient documentation

## 2011-04-10 DIAGNOSIS — R112 Nausea with vomiting, unspecified: Secondary | ICD-10-CM | POA: Insufficient documentation

## 2011-04-10 DIAGNOSIS — D649 Anemia, unspecified: Secondary | ICD-10-CM | POA: Insufficient documentation

## 2011-04-10 DIAGNOSIS — J441 Chronic obstructive pulmonary disease with (acute) exacerbation: Secondary | ICD-10-CM | POA: Insufficient documentation

## 2011-04-10 DIAGNOSIS — Z7982 Long term (current) use of aspirin: Secondary | ICD-10-CM | POA: Insufficient documentation

## 2011-04-10 LAB — GLUCOSE, CAPILLARY: Glucose-Capillary: 135 mg/dL — ABNORMAL HIGH (ref 70–99)

## 2011-04-11 ENCOUNTER — Emergency Department (HOSPITAL_COMMUNITY): Payer: Medicare Other

## 2011-04-11 LAB — LIPASE, BLOOD: Lipase: 31 U/L (ref 11–59)

## 2011-04-11 LAB — URINE MICROSCOPIC-ADD ON

## 2011-04-11 LAB — CBC
HCT: 26.6 % — ABNORMAL LOW (ref 36.0–46.0)
MCH: 28 pg (ref 26.0–34.0)
MCHC: 30.8 g/dL (ref 30.0–36.0)
MCV: 90.8 fL (ref 78.0–100.0)
Platelets: 259 10*3/uL (ref 150–400)
RDW: 14.7 % (ref 11.5–15.5)
WBC: 14.7 10*3/uL — ABNORMAL HIGH (ref 4.0–10.5)

## 2011-04-11 LAB — COMPREHENSIVE METABOLIC PANEL
ALT: 9 U/L (ref 0–35)
Alkaline Phosphatase: 71 U/L (ref 39–117)
BUN: 30 mg/dL — ABNORMAL HIGH (ref 6–23)
CO2: 26 mEq/L (ref 19–32)
Chloride: 105 mEq/L (ref 96–112)
Glucose, Bld: 138 mg/dL — ABNORMAL HIGH (ref 70–99)
Potassium: 4.9 mEq/L (ref 3.5–5.1)
Sodium: 139 mEq/L (ref 135–145)
Total Bilirubin: 0.1 mg/dL — ABNORMAL LOW (ref 0.3–1.2)
Total Protein: 6.5 g/dL (ref 6.0–8.3)

## 2011-04-11 LAB — URINALYSIS, ROUTINE W REFLEX MICROSCOPIC
Glucose, UA: NEGATIVE mg/dL
Ketones, ur: NEGATIVE mg/dL
Nitrite: NEGATIVE
Protein, ur: NEGATIVE mg/dL

## 2011-04-11 LAB — CK TOTAL AND CKMB (NOT AT ARMC)
CK, MB: 6.7 ng/mL (ref 0.3–4.0)
Total CK: 639 U/L — ABNORMAL HIGH (ref 7–177)

## 2011-04-11 LAB — DIFFERENTIAL
Eosinophils Absolute: 0 10*3/uL (ref 0.0–0.7)
Eosinophils Relative: 0 % (ref 0–5)
Lymphocytes Relative: 10 % — ABNORMAL LOW (ref 12–46)
Lymphs Abs: 1.5 10*3/uL (ref 0.7–4.0)
Monocytes Absolute: 0.9 10*3/uL (ref 0.1–1.0)
Monocytes Relative: 6 % (ref 3–12)

## 2011-04-11 LAB — CARDIAC PANEL(CRET KIN+CKTOT+MB+TROPI): CK, MB: 6.2 ng/mL (ref 0.3–4.0)

## 2011-04-11 MED ORDER — IOHEXOL 300 MG/ML  SOLN
100.0000 mL | Freq: Once | INTRAMUSCULAR | Status: AC | PRN
  Administered 2011-04-11: 100 mL via INTRAVENOUS

## 2011-04-14 ENCOUNTER — Emergency Department (HOSPITAL_COMMUNITY): Payer: Medicare Other

## 2011-04-14 ENCOUNTER — Inpatient Hospital Stay (HOSPITAL_COMMUNITY)
Admission: EM | Admit: 2011-04-14 | Discharge: 2011-04-17 | DRG: 392 | Disposition: A | Discharge status: Home / Self Care | Payer: Medicare Other | Attending: Internal Medicine | Admitting: Internal Medicine

## 2011-04-14 DIAGNOSIS — C3430 Malignant neoplasm of lower lobe, unspecified bronchus or lung: Secondary | ICD-10-CM | POA: Diagnosis present

## 2011-04-14 DIAGNOSIS — R918 Other nonspecific abnormal finding of lung field: Secondary | ICD-10-CM

## 2011-04-14 DIAGNOSIS — D509 Iron deficiency anemia, unspecified: Secondary | ICD-10-CM | POA: Diagnosis present

## 2011-04-14 DIAGNOSIS — E119 Type 2 diabetes mellitus without complications: Secondary | ICD-10-CM | POA: Diagnosis present

## 2011-04-14 DIAGNOSIS — E860 Dehydration: Secondary | ICD-10-CM | POA: Diagnosis present

## 2011-04-14 DIAGNOSIS — I2510 Atherosclerotic heart disease of native coronary artery without angina pectoris: Secondary | ICD-10-CM | POA: Diagnosis present

## 2011-04-14 DIAGNOSIS — E039 Hypothyroidism, unspecified: Secondary | ICD-10-CM | POA: Diagnosis present

## 2011-04-14 DIAGNOSIS — J449 Chronic obstructive pulmonary disease, unspecified: Secondary | ICD-10-CM | POA: Diagnosis present

## 2011-04-14 DIAGNOSIS — I10 Essential (primary) hypertension: Secondary | ICD-10-CM | POA: Diagnosis present

## 2011-04-14 DIAGNOSIS — J4489 Other specified chronic obstructive pulmonary disease: Secondary | ICD-10-CM | POA: Diagnosis present

## 2011-04-14 DIAGNOSIS — K5289 Other specified noninfective gastroenteritis and colitis: Principal | ICD-10-CM | POA: Diagnosis present

## 2011-04-14 LAB — TROPONIN I: Troponin I: <0.3 ng/mL (ref <0.30)

## 2011-04-14 LAB — COMPREHENSIVE METABOLIC PANEL
ALT: 12 U/L (ref 0–35)
AST: 14 U/L (ref 0–37)
Albumin: 3.4 g/dL — ABNORMAL LOW (ref 3.5–5.2)
Alkaline Phosphatase: 79 U/L (ref 39–117)
Chloride: 102 mEq/L (ref 96–112)
Potassium: 4.6 mEq/L (ref 3.5–5.1)
Sodium: 138 mEq/L (ref 135–145)
Total Bilirubin: 0.1 mg/dL — ABNORMAL LOW (ref 0.3–1.2)
Total Protein: 6.8 g/dL (ref 6.0–8.3)

## 2011-04-14 LAB — DIFFERENTIAL
Eosinophils Absolute: 0.2 10*3/uL (ref 0.0–0.7)
Eosinophils Relative: 2 % (ref 0–5)
Lymphocytes Relative: 21 % (ref 12–46)
Lymphs Abs: 1.8 10*3/uL (ref 0.7–4.0)
Monocytes Relative: 9 % (ref 3–12)

## 2011-04-14 LAB — CK TOTAL AND CKMB (NOT AT ARMC): CK, MB: 2.2 ng/mL (ref 0.3–4.0)

## 2011-04-14 LAB — CBC
HCT: 30.8 % — ABNORMAL LOW (ref 36.0–46.0)
MCH: 28 pg (ref 26.0–34.0)
MCV: 89.8 fL (ref 78.0–100.0)
RBC: 3.43 MIL/uL — ABNORMAL LOW (ref 3.87–5.11)
RDW: 14.5 % (ref 11.5–15.5)
WBC: 8.6 10*3/uL (ref 4.0–10.5)

## 2011-04-15 ENCOUNTER — Observation Stay (HOSPITAL_COMMUNITY): Payer: Medicare Other

## 2011-04-15 LAB — DIFFERENTIAL
Eosinophils Relative: 3 % (ref 0–5)
Lymphocytes Relative: 23 % (ref 12–46)
Lymphs Abs: 1.5 10*3/uL (ref 0.7–4.0)
Monocytes Absolute: 0.6 10*3/uL (ref 0.1–1.0)
Monocytes Relative: 10 % (ref 3–12)

## 2011-04-15 LAB — CBC
MCH: 28.2 pg (ref 26.0–34.0)
MCHC: 31.3 g/dL (ref 30.0–36.0)
MCV: 90.4 fL (ref 78.0–100.0)
Platelets: 230 10*3/uL (ref 150–400)
RDW: 14.5 % (ref 11.5–15.5)

## 2011-04-15 LAB — GLUCOSE, CAPILLARY: Glucose-Capillary: 81 mg/dL (ref 70–99)

## 2011-04-15 LAB — HEMOGLOBIN A1C
Hgb A1c MFr Bld: 5.8 % — ABNORMAL HIGH (ref <5.7)
Mean Plasma Glucose: 120 mg/dL — ABNORMAL HIGH (ref <117)

## 2011-04-15 LAB — BASIC METABOLIC PANEL
BUN: 20 mg/dL (ref 6–23)
CO2: 30 mEq/L (ref 19–32)
Chloride: 107 mEq/L (ref 96–112)
Glucose, Bld: 91 mg/dL (ref 70–99)
Potassium: 4.8 mEq/L (ref 3.5–5.1)

## 2011-04-15 LAB — MAGNESIUM: Magnesium: 2.2 mg/dL (ref 1.5–2.5)

## 2011-04-16 ENCOUNTER — Ambulatory Visit (HOSPITAL_COMMUNITY)
Admission: RE | Admit: 2011-04-16 | Discharge: 2011-04-16 | Disposition: A | Discharge status: Home / Self Care | Payer: Medicare Other | Source: Ambulatory Visit | Attending: Internal Medicine | Admitting: Internal Medicine

## 2011-04-16 ENCOUNTER — Ambulatory Visit (HOSPITAL_COMMUNITY): Payer: Medicare Other

## 2011-04-16 ENCOUNTER — Encounter (HOSPITAL_COMMUNITY): Payer: Self-pay

## 2011-04-16 DIAGNOSIS — R911 Solitary pulmonary nodule: Secondary | ICD-10-CM | POA: Insufficient documentation

## 2011-04-16 DIAGNOSIS — J984 Other disorders of lung: Secondary | ICD-10-CM | POA: Insufficient documentation

## 2011-04-16 HISTORY — DX: Solitary pulmonary nodule: R91.1

## 2011-04-16 LAB — DIFFERENTIAL
Basophils Absolute: 0 10*3/uL (ref 0.0–0.1)
Basophils Relative: 0 % (ref 0–1)
Eosinophils Relative: 2 % (ref 0–5)
Monocytes Absolute: 0.6 10*3/uL (ref 0.1–1.0)
Monocytes Relative: 9 % (ref 3–12)

## 2011-04-16 LAB — COMPREHENSIVE METABOLIC PANEL
ALT: 36 U/L — ABNORMAL HIGH (ref 0–35)
CO2: 26 mEq/L (ref 19–32)
Calcium: 9 mg/dL (ref 8.4–10.5)
Creatinine, Ser: 1.16 mg/dL (ref 0.4–1.2)
GFR calc Af Amer: 54 mL/min — ABNORMAL LOW (ref >60)
GFR calc non Af Amer: 45 mL/min — ABNORMAL LOW (ref >60)
Glucose, Bld: 89 mg/dL (ref 70–99)
Sodium: 141 mEq/L (ref 135–145)
Total Protein: 5.9 g/dL — ABNORMAL LOW (ref 6.0–8.3)

## 2011-04-16 LAB — CBC
HCT: 27.4 % — ABNORMAL LOW (ref 36.0–46.0)
MCH: 27.1 pg (ref 26.0–34.0)
MCHC: 29.9 g/dL — ABNORMAL LOW (ref 30.0–36.0)
RDW: 14.5 % (ref 11.5–15.5)

## 2011-04-16 LAB — IRON AND TIBC
Iron: 19 ug/dL — ABNORMAL LOW (ref 42–135)
UIBC: 343 ug/dL

## 2011-04-16 LAB — CLOSTRIDIUM DIFFICILE BY PCR: Toxigenic C. Difficile by PCR: NEGATIVE

## 2011-04-16 LAB — GLUCOSE, CAPILLARY
Glucose-Capillary: 97 mg/dL (ref 70–99)
Glucose-Capillary: 88 mg/dL (ref 70–99)

## 2011-04-16 LAB — VITAMIN B12: Vitamin B-12: 278 pg/mL (ref 211–911)

## 2011-04-16 MED ORDER — FLUDEOXYGLUCOSE F - 18 (FDG) INJECTION
17.7000 | Freq: Once | INTRAVENOUS | Status: AC | PRN
  Administered 2011-04-16: 17.7 via INTRAVENOUS

## 2011-04-16 NOTE — Group Therapy Note (Signed)
  NAME:  Madison Henson, Madison Henson NO.:  [id number]  MEDICAL RECORD NO.:  [id number]           PATIENT TYPE:  LOCATION:                                 FACILITY:  PHYSICIAN:  Wilson Singer, M.D.DATE OF BIRTH:  January 19, 1928  DATE OF PROCEDURE:  04/15/2011 DATE OF DISCHARGE:                                PROGRESS NOTE   This lady actually has improved significantly from when she was admitted.  She was admitted with a history of nausea, vomiting, and diarrhea for the last week or so associated with abdominal pain.  She says that all her symptoms actually have improved, and she has had no further diarrhea this morning, nor she had vomiting and tolerating clear fluids.  She has not had a fever.  I note that on Apr 11, 2011, when she was in the emergency room with the same symptoms, a CT scan of the abdomen and pelvis was done.  Part of the report shows that she has a 1.5-cm nodule within the right lower lobe which is concerning for bronchogenic malignancy.  PHYSICAL EXAMINATION:  VITAL SIGNS:  Temperature 97.4, blood pressure soft at 92/54, pulse 64, saturation 99% on 2 liters oxygen. GENERAL:  She does look systemically well.  She is not septic or toxic. CARDIAC:  Heart sounds are present and normal. CHEST:  Lung fields clear with a few scattered rhonchi which I think are chronic. ABDOMEN:  Soft and nontender and bowel sounds are heard.  INVESTIGATIONS:  Hemoglobin 8.5, white blood cell count 6.7, platelets 230.  Sodium 142, potassium 4.8, bicarbonate 30, BUN 20, creatinine 1.59.  Lipase was done yesterday and it was 30.  PROBLEM LIST: 1. Picture of gastroenteritis, improving. 2. Dehydration, somewhat getting worse. 3. Coronary artery disease, stable. 4. Hypertension, stable with slightly soft blood pressure. 5. Diabetes with controlled sugars. 6. A 1.5-cm right lower lobe nodule with ongoing tobacco abuse.  PLAN: 1. Advance diet to a soft diet. 2. Continue oral  metronidazole and await stool studies. 3. PET scan for right lower lobe nodule.     Wilson Singer, M.D.     NCG/MEDQ  D:  04/15/2011  T:  04/15/2011  Job:  [id number]  Electronically Signed by Lilly Cove M.D. on 04/16/2011 10:21:11 AM

## 2011-04-16 NOTE — Group Therapy Note (Signed)
  NAME:  Madison Henson, Madison Henson               ACCOUNT NO.:  [id number]  MEDICAL RECORD NO.:  [id number]           PATIENT TYPE:  I  LOCATION:  A301                          FACILITY:  [facility]  PHYSICIAN:  Wilson Singer, M.D.DATE OF BIRTH:  [birth year]  DATE OF PROCEDURE:  04/16/2011 DATE OF DISCHARGE:                                PROGRESS NOTE   This lady is clearly improved now and was able to tolerate a soft diet yesterday.  She has no further diarrhea, nausea or vomiting.  She is due to go for a PET scan today at [facility].  PHYSICAL EXAMINATION:  VITAL SIGNS:  Temperature 98, blood pressure 128/71, pulse 74, saturation 96% on 2 liters oxygen. GENERAL:  She looks systemically well. CV:  Heart sounds are present and normal. LUNGS:  Lung fields are clear. ABDOMEN:  Soft and nontender.  Bowel sounds are heard.  INVESTIGATIONS:  Sodium 141, potassium 5.1, bicarbonate 26, BUN 13, creatinine 1.16.  Hemoglobin 8.2, white blood cell count 6.2, platelets 235, hemoglobin A1c 5.8%.  She did have a barium swallow done yesterday which shows no esophageal mass or obstruction, no hiatal hernia and no evidence of reflux, stricture or mucosal changes of esophagitis.  The patient is not keen to have upper GI endoscopy at this point.  IMPRESSION: 1. Gastroenteritis, resolving. 2. Dehydration, resolved. 3. Coronary artery disease, stable. 4. Hypertension, stable. 5. Diabetes controlled. 6. A 1.5 cm right lower lobe nodule with ongoing tobacco abuse.  PLAN: 1. Advance diet to carbohydrate modified. 2. Reduce IV fluids to [facility] and await stool studies. 3. Await PET scan.     Wilson Singer, M.D.     NCG/MEDQ  D:  04/16/2011  T:  04/16/2011  Job:  [id number]  Electronically Signed by Lilly Cove M.D. on 04/16/2011 10:21:23 AM

## 2011-04-17 LAB — COMPREHENSIVE METABOLIC PANEL
Albumin: 3.3 g/dL — ABNORMAL LOW (ref 3.5–5.2)
Alkaline Phosphatase: 81 U/L (ref 39–117)
BUN: 11 mg/dL (ref 6–23)
CO2: 27 mEq/L (ref 19–32)
Chloride: 107 mEq/L (ref 96–112)
Creatinine, Ser: 1.18 mg/dL (ref 0.4–1.2)
GFR calc non Af Amer: 44 mL/min — ABNORMAL LOW (ref >60)
Potassium: 4.4 mEq/L (ref 3.5–5.1)
Total Bilirubin: 0.2 mg/dL — ABNORMAL LOW (ref 0.3–1.2)

## 2011-04-17 LAB — CBC
HCT: 27.9 % — ABNORMAL LOW (ref 36.0–46.0)
Hemoglobin: 8.7 g/dL — ABNORMAL LOW (ref 12.0–15.0)
MCH: 27.9 pg (ref 26.0–34.0)
MCV: 89.4 fL (ref 78.0–100.0)
Platelets: 263 10*3/uL (ref 150–400)
RBC: 3.12 MIL/uL — ABNORMAL LOW (ref 3.87–5.11)
WBC: 6.4 10*3/uL (ref 4.0–10.5)

## 2011-04-17 LAB — DIFFERENTIAL
Eosinophils Absolute: 0.2 10*3/uL (ref 0.0–0.7)
Lymphocytes Relative: 21 % (ref 12–46)
Lymphs Abs: 1.4 10*3/uL (ref 0.7–4.0)
Monocytes Relative: 9 % (ref 3–12)
Neutro Abs: 4.3 10*3/uL (ref 1.7–7.7)
Neutrophils Relative %: 67 % (ref 43–77)

## 2011-04-17 LAB — FECAL LACTOFERRIN, QUANT: Fecal Lactoferrin: NEGATIVE

## 2011-04-17 NOTE — Discharge Summary (Signed)
NAME:  Madison Henson, Madison Henson               ACCOUNT NO.:  [id number]  MEDICAL RECORD NO.:  [id number]           PATIENT TYPE:  I  LOCATION:  A301                          FACILITY:  [facility]  PHYSICIAN:  Wilson Singer, M.D.DATE OF BIRTH:  [birth year]  DATE OF ADMISSION:  04/15/2011 DATE OF DISCHARGE:  06/02/2012LH                              DISCHARGE SUMMARY   FINAL DISCHARGE DIAGNOSES: 1. Gastroenteritis, resolved. 2. Iron-deficiency anemia. 3. Probable right lower lobe lung cancer. 4. Coronary artery disease, stable. 5. Hypertension, stable. 6. Diabetes, controlled.  CONDITION ON DISCHARGE:  Stable.  MEDICATIONS ON DISCHARGE:  Ferrous sulfate 325 mg daily.  Continue home medications, which include amlodipine 5 mg daily, aspirin 81 mg daily, Cymbalta 60 mg daily, furosemide 20 mg daily p.r.n., Janumet 50/500 one tablet b.i.d., lisinopril 20 mg daily, Plavix 75 mg daily, potassium chloride 20 mEq daily, ProAir inhaler 2 puffs four times a day, p.r.n., Simvastatin 40 mg at bedtime, Synthroid 125 mcg daily, vitamin B12 injection monthly, Zofran 8 mg every 4 hours p.r.n.  Discontinue ciprofloxacin.  HISTORY OF PRESENT ILLNESS:  This very pleasant 77 year old lady was admitted with persistent nausea, vomiting, and diarrhea and abdominal pain for one week.  Please see initial history and physical examination done by Dr. Vedia Coffer.  HOSPITAL PROGRESS:  Patient was felt to have gastroenteritis and she was treated appropriately with intravenous fluids and empirically with antibiotics, with metronidazole.  However, her C diff by PCR was negative.  Stool lactoferrin was actually negative also.  Her ESR was 30.  She improved just by conservative measures and today she feels much improved and back to normal where she is able to eat a regular diet, has no abdominal pain, nausea, vomiting, or diarrhea.  During her hospitalization, she did have a review of her CT scan of abdomen and pelvis  that was done on May 27 when she was in the emergency room. There was a finding on the CT scan of a right lower lobe 1.5 cm lung nodule.  This was concerning for malignancy.  Therefore, she underwent a PET scan here in the hospital.  Therefore, she went to [facility] in the Nuclear Medicine Department for a PET scan.  PET scan confirmed hypermetabolic pulmonary nodule within the right lower lobe suggesting bronchogenic carcinoma.  There was no evidence of nodal or distant metastases.  Staging criteria would make this a stage IA malignancy.  There was a recommendation for thoracic surgery consultation.  She also underwent barium swallow in view of dysphagia and this was actually normal with no evidence of obstruction, stricture, malignancy seen on the swallow.  She was noted to be anemic with a hemoglobin in the 8.7 range and any anemia panel was done, which was very suggestive of an iron deficient picture with serum iron reduced at 19, saturation very low at 5%, and a serum ferritin at the very low end of normal at 14.  B12 and folate levels were unremarkable and normal. Today, she feels well as mentioned above.  PHYSICAL EXAMINATION:  VITAL SIGNS:  Temperature 98.6, blood pressure 134/62, pulse 68, saturation 94% on  room air.  GENERAL:  She looks systemically well.  HEART:  Sounds are present and are normal.  LUNG: Fields clear.  ABDOMEN:  Soft and nontender without any masses felt and bowel sounds are heard.  Investigations today show sodium of 142, potassium 4.4, bicarbonate 27, BUN 11, creatinine 1.18.  Albumin is slightly reduced at 3.3. Hemoglobin 8.7 this morning.  White blood cell count 6.4, platelets 264.  DISPOSITION:  The patient clearly needs followup for this presumed right lower lobe lung cancer.  I would suggest she go to the [facility] in [place] or she could be referred to Dr. Mariel Sleet as an outpatient and he would follow things  from there.  Also, she needs followup for her iron-deficiency anemia and I suggest she go see Dr. Jonette Eva who she has seen before with a possible upper GI endoscopy to look for any specific malignancy or peptic ulcer disease.  I have conveyed this to the patient and family at the bedside.  We will follow up as soon as possible this week.     Wilson Singer, M.D.     NCG/MEDQ  D:  04/17/2011  T:  04/17/2011  Job:  [id number]  cc:   [name], M.D. Fax: [phone]  Jonette Eva, M.D. [address] [place] , [place] [address]  Electronically Signed by Lilly Cove M.D. on 04/17/2011 02:15:20 PM

## 2011-04-18 NOTE — H&P (Signed)
NAME:  Madison Henson, Madison Henson NO.:  [id number]  MEDICAL RECORD NO.:  [id number]           PATIENT TYPE:  O  LOCATION:  A301                          FACILITY:  [facility]  PHYSICIAN:  Vania Rea, M.D. DATE OF BIRTH:  15-May-1928  DATE OF ADMISSION:  04/14/2011 DATE OF DISCHARGE:  LH                             HISTORY & PHYSICAL   PRIMARY CARE PHYSICIAN:  [name], MD  CHIEF COMPLAINT:  Persistent nausea, vomiting, and diarrhea with abdominal pain for 1 week.  HISTORY OF PRESENT ILLNESS:  This is an 77 year old Caucasian lady with multiple medical problems including multiple endoscopies for gastrointestinal complaints who presents complaining of persisting gastrointestinal symptoms for 1 week.  The patient was seen in the emergency room 3 days ago and at that time had a CT scan of the abdomen which showed no evidence of acute gastrointestinal problems.  She did have mild diverticulosis noted without obvious diverticulitis.  There was question of possible acute pancreatitis, but it was not supported by biochemical evidence.  The patient was discharged home on symptomatic treatment, but returns with persistent vomiting and diarrhea and complains that there was a streak of blood in the vomitus this morning and also some blood in the diarrhea.  Additionally, the patient complains of abdominal pain in both left and right lower quadrants.  She denies any fever.  She denies any chest pains or shortness of breath. She denies frequency or dysuria.  She does complain of persistent thirst and inability to keep down anything.  PAST MEDICAL HISTORY: 1. Sigmoid diverticulosis, multiple polyps.  Last colonoscopy in July     2011.  Biopsy is negative for malignancy. 2. GERD. 3. Persistent dysphagia. 4. COPD. 5. Anxiety and depression. 6. Coronary artery disease, status post in-stent thrombosis in 2008     after temporary discontinuation of aspirin and Plavix because of  concern for bleeding. 7. Ongoing tobacco abuse. 8. Hypertension. 9. Hyperlipidemia. 10.Hypothyroidism. 11.Diabetes type 2 with gastroparesis and 70% retention at 2 hours.  MEDICATIONS:  Her medication reconciliation is pending, however, medication list includes simvastatin, Plavix, aspirin, lisinopril, amlodipine, Janumet, Synthroid, Cymbalta, furosemide, potassium, ProAir inhaler, vitamin D, Zofran, Proventil, and Cipro.  ALLERGIES:  SULFA.  SOCIAL HISTORY:  She continues to smoke half a 1/2 of cigarettes per day.  Denies alcohol or illicit drug use.  She is retired, formerly worked in a [employer].  FAMILY HISTORY:  Significant for a sister who had colon cancer in her late 18s.  Her daughter also had colon cancers.  Also history of prostate and lung and ovarian cancer in her family.  Family history is also significant for heart disease and arthritis.  REVIEW OF SYSTEMS:  Other than noted above, significant only for weakness and persistent generalized itchy skin rash for which she is currently seeing Dr. Margo Aye, dermatologist  PHYSICAL EXAMINATION:  GENERAL:  A pleasant elderly Caucasian lady, lying in bed, persistently itching on her face. VITAL SIGNS:  Temperature is 98.1, pulse 75, respirations 18, blood pressure 114/60.  She is saturating 94% on room air. HEENT:  Her pupils are round and equal.  Mucous membranes pink, dry, anicteric. NECK:  No cervical lymphadenopathy.  No thyromegaly or carotid bruit. CHEST:  She has coarse breath sounds. CARDIOVASCULAR:  Regular rhythm.  No murmur. ABDOMEN:  Obese, soft.  She is tender in the lower quadrants bilaterally.  She has increased abdominal bowel sounds. EXTREMITIES:  Without edema.  She has arthritic deformities in the knees and hands. SKIN:  Scaly and dry.  She seems to have generalized type of dry eczema involving all areas of her skin. CENTRAL NERVOUS SYSTEM:  Cranial nerves II-XII are grossly intact.   She has no focal lateralizing signs.  PHYSICAL EXAMINATION:  Her white count is 8.6.  Hemoglobin 9.6, it was 8.2 four days ago.  Her platelet count is 270.  Differential on her white count is completely normal.  Her sodium is 138, potassium 4.6, chloride 102, CO2 of 30, glucose 87, BUN 17, creatinine slightly elevated at 1.39, although it is improved from 1.64 a few days ago.  Her total bilirubin is 0.1, her albumin is 3.4, calcium 9.9, total protein 6.8.  The rest of her liver functions are completely normal.  Her cardiac enzymes completely normal with a total CK of 92 and undetectable troponins.  Her lipase is normal at 13.  Two-view chest x-ray shows no acute abnormalities.  The CT scan done 3 days ago was already discussed.  ASSESSMENT: 1. Acute gastroenteritis with history of scant upper and lower     gastrointestinal bleeding, possibly secondary to persistent     gastroenteritis. 2. Dehydration 3. Coronary artery disease. 4. Hypertension. 5. Diabetes type 2, controlled. 6. History of hypothyroidism.  PLAN: 1. We will admit this lady for hydration.  We will hold her diuretics     and her antihypertensives temporarily as we hydrate.  We     will hold her aspirin, but will continue her Plavix for the time     being, check stool for C. diff and leukocytes, and give empiric     Flagyl.  If the patient is not improving rapidly on conservative     management, we will consult GI Service. 2. We will get medication reconciliation done and restart as many of     her medications as we can in the morning, but in all likelihood we     will hold her metformin.  Other plans as per orders.     Vania Rea, M.D.     LC/MEDQ  D:  04/15/2011  T:  04/15/2011  Job:  [id number]  cc:   Jonette Eva, M.D. [address] [place] , [place] [address]  [name], M.D. Fax: [phone]  Electronically Signed by Vania Rea M.D. on 04/18/2011 05:15:14 AM

## 2011-04-19 LAB — GLUCOSE, CAPILLARY: Glucose-Capillary: 95 mg/dL (ref 70–99)

## 2011-04-19 LAB — OVA AND PARASITE EXAMINATION: Ova and parasites: NONE SEEN

## 2011-04-22 ENCOUNTER — Other Ambulatory Visit: Payer: Self-pay | Admitting: *Deleted

## 2011-04-22 MED ORDER — AMLODIPINE BESYLATE 5 MG PO TABS: 5.0000 mg | ORAL_TABLET | Freq: Every day | ORAL | Status: DC

## 2011-04-23 ENCOUNTER — Other Ambulatory Visit (HOSPITAL_COMMUNITY): Payer: Medicare Other

## 2011-04-27 ENCOUNTER — Other Ambulatory Visit: Payer: Self-pay | Admitting: Thoracic Surgery

## 2011-04-27 ENCOUNTER — Encounter (INDEPENDENT_AMBULATORY_CARE_PROVIDER_SITE_OTHER): Payer: Medicare Other | Admitting: Thoracic Surgery

## 2011-04-27 DIAGNOSIS — D491 Neoplasm of unspecified behavior of respiratory system: Secondary | ICD-10-CM

## 2011-04-27 DIAGNOSIS — R918 Other nonspecific abnormal finding of lung field: Secondary | ICD-10-CM

## 2011-04-28 ENCOUNTER — Telehealth: Payer: Self-pay | Admitting: Cardiovascular Disease

## 2011-04-28 NOTE — Telephone Encounter (Signed)
Pt has to have a biopsy and was told by Dr. Edwyna Shell to stop her plavix and daughter is concerned because last time she stopped she had a heart attack so she wants to talk to someone first

## 2011-04-28 NOTE — Telephone Encounter (Signed)
Pt's daughter given information from Dr. Clifton James

## 2011-04-28 NOTE — Letter (Signed)
April 27, 2011  [name], MD [address] [place],  [place] [address]  Re:  Madison Henson, Madison Henson               DOB:  [birth year]  Dear Ian Malkin;  I saw the patient in the office today.  This 77 year old patient has multiple medical problems.  She was being worked up for abdominal pain and was found to have a right lower lobe lesion.  She is a long-time smoker and still smokes.  The right lower lobe lesion was a 1-cm lesion in the right lower lobe.  A PET scan was done that showed that he had a standard uptake value of 3.9 hypermetabolic hilar or mediastinal nodes. She has had no hemoptysis, fever, chills or excessive sputum.  Her medications include Protonix, Aricept, Phenergan, Lortab, DuoDerm, Janumet, Cymbalta, Xanax, albuterol, enalapril, Plavix for a previous cardiac stents, Dexilant, Lasix, Neurontin, hydrocodone, Hycomine, lisinopril, potassium, simvastatin.  She is allergic to sulfa and Reglan.  Medical problems include hyperlipidemia, anemia, hypertension, coronary artery disease, chronic obstructive pulmonary disease, GERD, gastroparesis, sciatica and lumbar spinal stenosis.  She has had dysphagia, but this has improved and a history of melena as well as tobacco abuse.  FAMILY HISTORY:  Noncontributory.  SOCIAL HISTORY:  She is single and has one child, is retired from dry- cleaning, smokes one pack a day.  Does not drink alcohol on a regular basis.  REVIEW OF SYSTEMS:  VITAL SIGNS:  She is 215 pounds.  Weight is stable. CARDIAC:  Has previous stents and myocardial infarction. PULMONARY:  She is on home oxygen.  She is in productive cough, wheezing. GI:  She has had dysphagia, reflux, abdominal pain and recent GI showed decreased peristalsis, both primary and secondary to peristalsing in the esophagus. GU:  No kidney disease, dysuria or frequent urination. VASCULAR:  She has had pain in her legs on walking.  No TIAs. MUSCULOSKELETAL:  Arthritis and muscle  pain. NEUROLOGICAL:  No dizziness, headaches, blackouts or seizures. PSYCHIATRIC:  Depression or nervousness. EYES/ENT:  No change in her eyesight or hearing. HEMATOLOGICAL:  Anemia and no problems with bleeding or clotting disorders, but is on Plavix and aspirin.  PHYSICAL EXAMINATION:  General:  She is a obese Caucasian female, in no acute distress.  Vital Signs:  Her blood pressure was 105/57, pulse 82, respirations 20 and sats were 97%.  Head, Eyes, Ears, Nose And Throat: Unremarkable.  Neck:  Supple without thyromegaly.  There is no supraclavicular or axillary adenopathy.  Chest:  Clear to auscultation and percussion.  Heart:  Regular sinus rhythm.  No murmurs.  Abdomen: Obese.  Bowel sounds are normal.  Extremities:  Edema 1+ and 1+ pulses. Neurological:  She is oriented x3.  I think this lesion is probably an early cancer given the standard uptake value of 3.9.  I will proceed with getting pulmonary function test and a CT scan with the superdimension protocol, and I have scheduled her for the 22nd to do electromagnetic navigation bronchoscopy.  She will have to go off her Plavix for about 3 or 4 days before the bronchoscopy and we will let Dr. Clifton James know.  I appreciate the opportunity of seeing the patient.  Ines Bloomer, M.D. Electronically Signed  DPB/MEDQ  D:  04/27/2011  T:  04/28/2011  Job:  [id number]  cc:   Verne Carrow, MD

## 2011-04-28 NOTE — Telephone Encounter (Signed)
It should be safe to stop her Plavix for the biopsy. We cannot guarantee that the stent will not develop clot but It should be well healed by now. If she needs the biopsy, then she should hold the Plavix. Can we let her know? Thanks, chris

## 2011-04-28 NOTE — Telephone Encounter (Signed)
Spoke with pt's daughter who reports pt has spot on right lower lobe of lung. Had positive PET Scan and now needs biopsy on May 07, 2011 with Dr. Edwyna Shell (note in [place]) Daughter states pt is to stop Plavix after dose on June 17,2012 for biopsy and she would like Dr. Gibson Ramp input on stopping. Daughter states last time Plavix was stopped in 2008 pt had an MI.  Will forward to Dr. Clifton James to review and call pt after reviewed

## 2011-04-30 ENCOUNTER — Ambulatory Visit (HOSPITAL_COMMUNITY)
Admission: RE | Admit: 2011-04-30 | Discharge: 2011-04-30 | Disposition: A | Discharge status: Home / Self Care | Payer: Medicare Other | Source: Ambulatory Visit | Attending: Thoracic Surgery | Admitting: Thoracic Surgery

## 2011-04-30 DIAGNOSIS — J984 Other disorders of lung: Secondary | ICD-10-CM | POA: Insufficient documentation

## 2011-04-30 DIAGNOSIS — R918 Other nonspecific abnormal finding of lung field: Secondary | ICD-10-CM

## 2011-04-30 DIAGNOSIS — Z09 Encounter for follow-up examination after completed treatment for conditions other than malignant neoplasm: Secondary | ICD-10-CM | POA: Insufficient documentation

## 2011-05-05 ENCOUNTER — Other Ambulatory Visit (HOSPITAL_COMMUNITY): Payer: Medicare Other

## 2011-05-06 ENCOUNTER — Encounter (HOSPITAL_COMMUNITY)
Admission: RE | Admit: 2011-05-06 | Discharge: 2011-05-06 | Disposition: A | Discharge status: Home / Self Care | Payer: Medicare Other | Source: Ambulatory Visit | Attending: Thoracic Surgery | Admitting: Thoracic Surgery

## 2011-05-06 LAB — COMPREHENSIVE METABOLIC PANEL
ALT: 9 U/L (ref 0–35)
Albumin: 3.6 g/dL (ref 3.5–5.2)
BUN: 22 mg/dL (ref 6–23)
Calcium: 9.3 mg/dL (ref 8.4–10.5)
GFR calc Af Amer: 36 mL/min — ABNORMAL LOW (ref >60)
Glucose, Bld: 85 mg/dL (ref 70–99)
Potassium: 5.9 mEq/L — ABNORMAL HIGH (ref 3.5–5.1)
Sodium: 142 mEq/L (ref 135–145)
Total Protein: 6.5 g/dL (ref 6.0–8.3)

## 2011-05-06 LAB — CBC
Hemoglobin: 10.5 g/dL — ABNORMAL LOW (ref 12.0–15.0)
MCH: 27.6 pg (ref 26.0–34.0)
MCHC: 30.3 g/dL (ref 30.0–36.0)
RDW: 16.1 % — ABNORMAL HIGH (ref 11.5–15.5)

## 2011-05-06 LAB — PROTIME-INR: Prothrombin Time: 13 seconds (ref 11.6–15.2)

## 2011-05-06 LAB — APTT: aPTT: 29 seconds (ref 24–37)

## 2011-05-07 ENCOUNTER — Telehealth: Payer: Self-pay | Admitting: Cardiovascular Disease

## 2011-05-07 NOTE — Telephone Encounter (Signed)
Faxed OV & EKG to [facility] Short Stay ([phone]).

## 2011-05-10 ENCOUNTER — Other Ambulatory Visit: Payer: Self-pay | Admitting: Thoracic Surgery

## 2011-05-10 ENCOUNTER — Ambulatory Visit (HOSPITAL_COMMUNITY)
Admission: RE | Admit: 2011-05-10 | Discharge: 2011-05-10 | Disposition: A | Discharge status: Home / Self Care | Payer: Medicare Other | Source: Ambulatory Visit | Attending: Thoracic Surgery | Admitting: Thoracic Surgery

## 2011-05-10 ENCOUNTER — Ambulatory Visit (HOSPITAL_COMMUNITY): Payer: Medicare Other

## 2011-05-10 DIAGNOSIS — D381 Neoplasm of uncertain behavior of trachea, bronchus and lung: Secondary | ICD-10-CM

## 2011-05-10 DIAGNOSIS — Z01818 Encounter for other preprocedural examination: Secondary | ICD-10-CM | POA: Insufficient documentation

## 2011-05-10 DIAGNOSIS — R918 Other nonspecific abnormal finding of lung field: Secondary | ICD-10-CM

## 2011-05-10 DIAGNOSIS — J984 Other disorders of lung: Secondary | ICD-10-CM | POA: Insufficient documentation

## 2011-05-10 DIAGNOSIS — Z01812 Encounter for preprocedural laboratory examination: Secondary | ICD-10-CM | POA: Insufficient documentation

## 2011-05-10 DIAGNOSIS — Z79899 Other long term (current) drug therapy: Secondary | ICD-10-CM | POA: Insufficient documentation

## 2011-05-10 HISTORY — PX: OTHER SURGICAL HISTORY: SHX169

## 2011-05-10 LAB — GLUCOSE, CAPILLARY
Glucose-Capillary: 86 mg/dL (ref 70–99)
Glucose-Capillary: 93 mg/dL (ref 70–99)

## 2011-05-10 LAB — POCT I-STAT, CHEM 8
Chloride: 110 mEq/L (ref 96–112)
HCT: 31 % — ABNORMAL LOW (ref 36.0–46.0)
Hemoglobin: 10.5 g/dL — ABNORMAL LOW (ref 12.0–15.0)
Potassium: 5.6 mEq/L — ABNORMAL HIGH (ref 3.5–5.1)
Sodium: 141 mEq/L (ref 135–145)

## 2011-05-10 LAB — BASIC METABOLIC PANEL
Calcium: 8.8 mg/dL (ref 8.4–10.5)
GFR calc Af Amer: >60 mL/min (ref >60)
GFR calc non Af Amer: >60 mL/min (ref >60)
Potassium: 3.9 mEq/L (ref 3.5–5.1)
Sodium: 139 mEq/L (ref 135–145)

## 2011-05-12 ENCOUNTER — Ambulatory Visit (INDEPENDENT_AMBULATORY_CARE_PROVIDER_SITE_OTHER): Payer: Medicare Other | Admitting: Thoracic Surgery

## 2011-05-12 DIAGNOSIS — D381 Neoplasm of uncertain behavior of trachea, bronchus and lung: Secondary | ICD-10-CM

## 2011-05-13 LAB — CULTURE, RESPIRATORY W GRAM STAIN

## 2011-05-13 NOTE — Letter (Signed)
May 12, 2011  [name], MD [address] [place],  [place] [address]  Re:  YANIAH, THIEMANN               DOB:  01/16/1928  Dear Ian Malkin,  I saw the patient in the office today and after she had undergone electromagnetic navigation bronchoscopy.  All of her biopsies were negative for cancer.  Thus the course does not mean that she does not have cancer as we could have possibly missed it, but given the negative biopsy, I think the best thing to do is just to follow her and I will plan to see her back again in 3 months with another CT scan if the lesion is increased in size or there is any major change, then we will proceed with a needle biopsy.  She agrees with this plan.  Her family agrees with the plan.  Blood pressure is 101/59, pulse 76, respirations 20, sats were 92%.  Ines Bloomer, M.D. Electronically Signed  DPB/MEDQ  D:  05/12/2011  T:  05/13/2011  Job:  [id number]

## 2011-05-17 NOTE — Op Note (Signed)
  NAME:  Madison Henson, Madison Henson NO.:  [id number]  MEDICAL RECORD NO.:  [id number]  LOCATION:                                 FACILITY:  PHYSICIAN:  Ines Bloomer, M.D. DATE OF BIRTH:  Jun 15, 1928  DATE OF PROCEDURE: DATE OF DISCHARGE:                              OPERATIVE REPORT   PREOPERATIVE DIAGNOSIS:  Right lower lobe nodule.  POSTOPERATIVE DIAGNOSIS:  Right lower lobe nodule.  OPERATION PERFORMED:  Video bronchoscopy with electromagnetic navigation.  SURGEON:  Ines Bloomer, MD.  ANESTHESIA:  General.  FINDINGS:  This patient had a 1.5-cm right lower lobe lesion that was very distal, was brought to the operating room for possible biopsy.  PROCEDURE IN DETAIL:  After general anesthesia, the video bronchoscope was passed through the endotracheal tube.  The carina was in the midline.  The left mainstem, left upper lobe, and left lower lobe orifices were normal.  The right mainstem, right upper lobe, right middle lobe, and right lower lobe orifices were also normal.  The locatable guide with extended working channel was then inserted and we did automatic registration in the usual fashion.  Then, we started navigation to the right lower lobe, extended the bronchoscope down to the takeoff of then right lower lobe, and then through the medial basilar segment, I passed the extended working channel with the locatable guide.  We got within about 3 cm of the lesion and it would not pass and so we tried several different approaches to get through the lesion, and we came laterally to medially, superiorly to inferiorly, and medially to laterally, getting within 1 to 1.5 cm of the lesion. Through the extended working channel, we did brushings from all three angles, passed into the extended working channel.  We did a needle aspiration on one and then did biopsies from two of the three angles. Initial report did showed inflammatory lesions, but we sent  multiple cultures.  At the end of the procedure, we then did a BAL.  We removed the extended working channel.  The patient was returned to recovery room in stable condition.     Ines Bloomer, M.D.     DPB/MEDQ  D:  05/10/2011  T:  05/11/2011  Job:  [id number]  Electronically Signed by Jovita Gamma M.D. on 05/17/2011 04:07:46 PM

## 2011-05-28 ENCOUNTER — Other Ambulatory Visit (HOSPITAL_COMMUNITY): Payer: Self-pay | Admitting: Internal Medicine

## 2011-06-02 ENCOUNTER — Ambulatory Visit (HOSPITAL_COMMUNITY)
Admission: RE | Admit: 2011-06-02 | Discharge: 2011-06-02 | Disposition: A | Discharge status: Home / Self Care | Payer: Medicare Other | Source: Ambulatory Visit | Attending: Internal Medicine | Admitting: Internal Medicine

## 2011-06-02 DIAGNOSIS — K224 Dyskinesia of esophagus: Secondary | ICD-10-CM | POA: Insufficient documentation

## 2011-06-02 DIAGNOSIS — R1310 Dysphagia, unspecified: Secondary | ICD-10-CM | POA: Insufficient documentation

## 2011-06-08 ENCOUNTER — Encounter: Payer: Self-pay | Admitting: Gastroenterology

## 2011-06-08 LAB — FUNGUS CULTURE W SMEAR: Fungal Smear: NONE SEEN

## 2011-06-10 ENCOUNTER — Telehealth: Payer: Self-pay | Admitting: Cardiovascular Disease

## 2011-06-10 NOTE — Telephone Encounter (Signed)
Per speech therapists call, please return call to discuss care plan for pt.

## 2011-06-10 NOTE — Telephone Encounter (Signed)
Speech Therapist, Irving Burton with Home Health stated that Madison Henson had trouble with dysphagia and needs a swallow evaluation. Since the original home health order came from cardiology, she has contacted [place]. I did advise her to have her primary care follow this but that we could give her the order for now. She agrees.

## 2011-06-16 ENCOUNTER — Ambulatory Visit (INDEPENDENT_AMBULATORY_CARE_PROVIDER_SITE_OTHER): Payer: Medicare Other | Admitting: Gastroenterology

## 2011-06-16 ENCOUNTER — Encounter: Payer: Self-pay | Admitting: Gastroenterology

## 2011-06-16 VITALS — BP 101/61 | HR 81 | Temp 98.0°F | Ht 69.0 in | Wt 221.4 lb

## 2011-06-16 DIAGNOSIS — R7989 Other specified abnormal findings of blood chemistry: Secondary | ICD-10-CM

## 2011-06-16 DIAGNOSIS — D649 Anemia, unspecified: Secondary | ICD-10-CM

## 2011-06-16 DIAGNOSIS — K5900 Constipation, unspecified: Secondary | ICD-10-CM

## 2011-06-16 DIAGNOSIS — K219 Gastro-esophageal reflux disease without esophagitis: Secondary | ICD-10-CM

## 2011-06-16 DIAGNOSIS — Z8719 Personal history of other diseases of the digestive system: Secondary | ICD-10-CM

## 2011-06-16 DIAGNOSIS — R1314 Dysphagia, pharyngoesophageal phase: Secondary | ICD-10-CM

## 2011-06-16 DIAGNOSIS — R1013 Epigastric pain: Secondary | ICD-10-CM

## 2011-06-16 DIAGNOSIS — K921 Melena: Secondary | ICD-10-CM

## 2011-06-16 DIAGNOSIS — R945 Abnormal results of liver function studies: Secondary | ICD-10-CM

## 2011-06-16 DIAGNOSIS — R7401 Elevation of levels of liver transaminase levels: Secondary | ICD-10-CM

## 2011-06-16 DIAGNOSIS — R1310 Dysphagia, unspecified: Secondary | ICD-10-CM

## 2011-06-16 DIAGNOSIS — K3184 Gastroparesis: Secondary | ICD-10-CM

## 2011-06-16 DIAGNOSIS — R1319 Other dysphagia: Secondary | ICD-10-CM

## 2011-06-16 NOTE — Progress Notes (Signed)
Primary Care Physician:  Dwana Melena, MD  Primary Gastroenterologist:  Jonette Eva, MD   Chief Complaint  Patient presents with  . Dysphagia    HPI:  Madison Henson is a 77 y.o. female here for further evaluation of difficulty swallowing. History of esophageal dysmotility disorder. 2 barium esophagram this year, confirm this. No obvious esophageal stricture noted. Last EGD in 2011 without dilation. Swallowing several times to get food down. Feels food in throat. Epigastric pain with all meals. Feels like food sticking in chest. Really bothering her for two months. No heartburn on PPI. Spoke with her speech therapist, Irving Burton today. Trying different techniques with the patient to prevent laryngeal penetration. She notes when the patient takes a fourth of a teaspoon of 2 and a couple of sips of liquid she will start having burping and the food will come back up almost immediately. She feels like there is issue in the upper esophagus given how quickly this happens. She states sometimes she can get laryngeal penetration due to reflux or esophageal dysphagia.   BM small stools for few days, then multiple black loose stools. Diagnosed with AAA within last year. Takes MiraLax only once weekly.   Also diagnosed with a pulmonary nodule, underwent recent biopsy which was negative. According to Dr. Scheryl Darter note, there still is a possibility that this could be cancer but they're monitoring her with CTs.   Previously had heart attack when Plavix was held.    Current Outpatient Prescriptions  Medication Sig Dispense Refill  . albuterol (PROAIR HFA) 108 (90 BASE) MCG/ACT inhaler Inhale 2 puffs into the lungs every 6 (six) hours as needed.        . ALPRAZolam (XANAX) 0.5 MG tablet Take 0.5 mg by mouth 2 (two) times daily.        Marland Kitchen amLODipine (NORVASC) 5 MG tablet Take 1 tablet (5 mg total) by mouth daily.  30 tablet  6  . aspirin 81 MG tablet Take 81 mg by mouth daily.        . clopidogrel (PLAVIX) 75 MG  tablet Take 1 tablet (75 mg total) by mouth daily.  30 tablet  8  . dexlansoprazole (DEXILANT) 60 MG capsule Take 60 mg by mouth daily.        . DULoxetine (CYMBALTA) 60 MG capsule Take 60 mg by mouth daily.        . furosemide (LASIX) 20 MG tablet Take 20 mg by mouth daily.        Marland Kitchen gabapentin (NEURONTIN) 100 MG capsule Take 300 mg by mouth 3 (three) times daily.       Marland Kitchen HYDROcodone-acetaminophen (LORTAB) 7.5-500 MG per tablet 1 tablet 3 (three) times daily as needed.       . hyoscyamine (LEVSIN SL) 0.125 MG SL tablet Place 0.125 mg under the tongue every 4 (four) hours as needed.        Marland Kitchen levothyroxine (SYNTHROID, LEVOTHROID) 112 MCG tablet Take 112 mcg by mouth daily.        Marland Kitchen lisinopril (PRINIVIL,ZESTRIL) 20 MG tablet Take 20 mg by mouth daily.        . potassium chloride SA (K-DUR,KLOR-CON) 20 MEQ tablet Take 20 mEq by mouth daily.        . simvastatin (ZOCOR) 20 MG tablet Take 1 tablet (20 mg total) by mouth at bedtime.  30 tablet  8  . sitaGLIPtan-metformin (JANUMET) 50-500 MG per tablet Take 1 tablet by mouth 2 (two) times daily with a meal.        .  Vitamin D, Ergocalciferol, (DRISDOL) 50000 UNITS CAPS Take 50,000 Units by mouth once a week.          Allergies as of 06/16/2011 - Review Complete 06/16/2011  Allergen Reaction Noted  . Metoclopramide hcl  06/26/2009  . Sulfonamide derivatives      Past Medical History  Diagnosis Date  . Coronary artery disease     post diaphragmatic wall infarction on 06-2007,due to stent thrombosis   . Chronic obstructive pulmonary disease   . Tobacco abuse   . Hypertension   . Hyperlipidemia   . Diabetes mellitus     with gastroparesis 70% retention at 2 hours  . GERD (gastroesophageal reflux disease)   . Anxiety disorder   . Depression   . Hypothyroidism   . Hx of hysterectomy   . AAA (abdominal aortic aneurysm)   . Pulmonary nodule 04/2011    neg bx, followed by Dr. Edwyna Shell, may still be cancer, being followed with CTs.  . Esophageal  dysmotility     Two BPE since 03/2011-->Moderate impairment of esophageal motility.Vallecular residuals. Laryngeal penetration  . Anemia     Past Surgical History  Procedure Date  . Cholecystectomy   . Cataract surg   . Bladder surgery     tack  . Kidney stone surgery   . Coronary stent placement 2004    2  . Coronary stent placement 2008    2  . S/p hysterectomy   . Neck surgery   . Esophagogastroduodenoscopy 5/08    normal esophagus, No H.Pylori  . Egd/bravo 07/2008    On Nexium BID. Day 1 DMSTR Score: 0.7, day 2: DMSTR score: 32, uncontrolled GERD, No H. Pylori  . Hbt 2009    negative  . Colonoscopy 05/2010    tortuous sigm colon, sigm tics, multiple simple adenomas, hemorrhoids, next TCS 10-15 yrs per Dr. Darrick Penna. Previous h/o tubular adenomas in 2008.   Marland Kitchen Esophagogastroduodenoscopy 05/2010    small hh, streaky erythema in body/antrum. Duodenal bx negative.     Family History  Problem Relation Age of Onset  . Coronary artery disease      family hx of  . Colon cancer Sister 53  . Colon cancer Daughter   . Prostate cancer      family hx of  . Lung cancer      family hx of  . Ovarian cancer      family hx of  . Arthritis      family hx of  . Other      family hx of chronic respiratory condition    History   Social History  . Marital Status: Widowed    Spouse Name: N/A    Number of Children: N/A  . Years of Education: N/A   Occupational History  . Not on file.   Social History Main Topics  . Smoking status: Current Everyday Smoker  . Smokeless tobacco: Not on file   Comment: She smokes 3 cigarretes daily  . Alcohol Use: No  . Drug Use: No  . Sexually Active: Not on file   Other Topics Concern  . Not on file   Social History Narrative  . No narrative on file      ROS:  General: Negative for anorexia, weight loss, fever, chills. Complains of fatigue, weakness. Eyes: Negative for vision changes.  ENT: Negative for nasal congestion. CV: Negative  for chest pain, angina, palpitations, dyspnea on exertion, peripheral edema.  Respiratory: Negative for dyspnea at rest, dyspnea on  exertion, cough, sputum, wheezing.  GI: See history of present illness. GU:  Negative for dysuria, hematuria, urinary incontinence, urinary frequency, nocturnal urination.  MS: Negative for joint pain, low back pain.  Derm: Negative for rash or itching.  Neuro: Negative for weakness, abnormal sensation, seizure, frequent headaches, memory loss, confusion.  Psych: Negative for anxiety, depression, suicidal ideation, hallucinations.  Endo: Negative for unusual weight change.  Heme: Negative for bruising or bleeding. Allergy: Negative for rash or hives.    Physical Examination:  BP 101/61  Pulse 81  Temp(Src) 98 F (36.7 C) (Temporal)  Ht 5\' 9"  (1.753 m)  Wt 221 lb 6.4 oz (100.426 kg)  BMI 32.69 kg/m2   General: Well-nourished, well-developed elderly white female in no acute distress.  Head: Normocephalic, atraumatic.   Eyes: Conjunctiva pink, no icterus. Mouth: Oropharyngeal mucosa moist and pink , no lesions erythema or exudate. Neck: Supple without thyromegaly, masses, or lymphadenopathy.  Lungs: Clear to auscultation bilaterally.  Heart: Regular rate and rhythm, no murmurs rubs or gallops.  Abdomen: Bowel sounds are normal, nontender, nondistended, no hepatosplenomegaly or masses, no abdominal bruits or    hernia , no rebound or guarding.   Rectal: Not performed. Extremities: No lower extremity edema. No clubbing or deformities.  Neuro: Alert and oriented x 4 , grossly normal neurologically.  Skin: Warm and dry, no rash or jaundice.   Psych: Alert and cooperative, normal mood and affect.   Lab Results  Component Value Date   WBC 8.9 05/06/2011   HGB 10.5* 05/10/2011   HCT 31.0* 05/10/2011   MCV 90.8 05/06/2011   PLT 276 05/06/2011   Lab Results  Component Value Date   ALT 9 05/06/2011   AST 13 05/06/2011   ALKPHOS 76 05/06/2011   BILITOT 0.2*  05/06/2011   Lab Results  Component Value Date   CREATININE 1.60* 05/10/2011   BUN 22 05/10/2011   NA 141 05/10/2011   K 5.6* 05/10/2011   CL 110 05/10/2011   CO2 23 05/10/2011   Lab Results  Component Value Date   IRON 19* 04/16/2011   TIBC 362 04/16/2011   FERRITIN 14 04/16/2011   Lab Results  Component Value Date   VITAMINB12 278 04/16/2011   Lab Results  Component Value Date   FOLATE  Value: >20.0 (NOTE)  Reference Ranges        Deficient:       0.4 - 3.3 ng/mL        Indeterminate:   3.4 - 5.4 ng/mL        Normal:              > 5.4 ng/mL CORRECTED ON 06/01 AT 2014: PREVIOUSLY REPORTED AS >20.0 04/16/2011    Imaging Studies: Dg Esophagus  06/02/2011  *RADIOLOGY REPORT*  Clinical Data: Thoracic dysphagia  BARIUM SWALLOW/ESOPHAGRAM:  Technique:  Single contrast, air contrast, and tablet imaging of the esophagus were performed.  Fluoroscopy time:  2.0 minutes  Comparison: None  Findings: Normal esophageal distention. No definite esophageal mass or stricture. 12.5 mm diameter barium tablet passes from oral cavity to stomach without delay. Moderate diffuse impairment of esophageal motility with incomplete clearance of barium by primary peristaltic waves. Numerous secondary and tertiary waves are identified. Clearance of contrast from thoracic esophagus is significantly aided by upright positioning/gravity. Smooth appearance of esophageal mucosa on air-contrast images. Vallecular residuals noted. Minimal laryngeal penetration without aspiration. Tiny sliding hiatal hernia. No persistent intraluminal filling defects. Extensive atherosclerotic calcification aortic arch. Bones appear demineralized.  Degenerative disc disease changes cervical spine.  IMPRESSION: Moderate impairment of esophageal motility. Vallecular residuals. Laryngeal penetration without aspiration. No definite evidence of esophageal mass or stricture.  Original Report Authenticated By: Lollie Marrow, M.D.

## 2011-06-17 ENCOUNTER — Encounter: Payer: Self-pay | Admitting: Gastroenterology

## 2011-06-17 DIAGNOSIS — K5900 Constipation, unspecified: Secondary | ICD-10-CM | POA: Insufficient documentation

## 2011-06-17 DIAGNOSIS — R1013 Epigastric pain: Secondary | ICD-10-CM | POA: Insufficient documentation

## 2011-06-17 DIAGNOSIS — R1310 Dysphagia, unspecified: Secondary | ICD-10-CM | POA: Insufficient documentation

## 2011-06-17 LAB — CBC WITH DIFFERENTIAL/PLATELET
Basophils Absolute: 0 10*3/uL (ref 0.0–0.1)
HCT: 31.1 % — ABNORMAL LOW (ref 36.0–46.0)
Lymphocytes Relative: 22 % (ref 12–46)
Lymphs Abs: 1.7 10*3/uL (ref 0.7–4.0)
MCV: 94.5 fL (ref 78.0–100.0)
Monocytes Absolute: 0.7 10*3/uL (ref 0.1–1.0)
Neutro Abs: 5.3 10*3/uL (ref 1.7–7.7)
Platelets: 303 10*3/uL (ref 150–400)
RBC: 3.29 MIL/uL — ABNORMAL LOW (ref 3.87–5.11)
RDW: 16.2 % — ABNORMAL HIGH (ref 11.5–15.5)
WBC: 8 10*3/uL (ref 4.0–10.5)

## 2011-06-17 LAB — COMPREHENSIVE METABOLIC PANEL
ALT: 9 U/L (ref 0–35)
AST: 13 U/L (ref 0–37)
Albumin: 3.9 g/dL (ref 3.5–5.2)
CO2: 25 mEq/L (ref 19–32)
Calcium: 9.2 mg/dL (ref 8.4–10.5)
Chloride: 108 mEq/L (ref 96–112)
Potassium: 5.5 mEq/L — ABNORMAL HIGH (ref 3.5–5.3)

## 2011-06-17 NOTE — Assessment & Plan Note (Signed)
History of anemia. Patient reports black watery stools. Recheck hemoglobin. Anemia profile in June was most consistent with iron deficiency anemia.

## 2011-06-17 NOTE — Assessment & Plan Note (Signed)
MiraLax daily.  

## 2011-06-17 NOTE — Assessment & Plan Note (Addendum)
Denies early satiety or nausea. Previously did not tolerate Reglan. Encourage multiple small meals daily. This is basically what is happening given her esophageal dysmotility. Could be contributing to regurgitation.

## 2011-06-17 NOTE — Assessment & Plan Note (Signed)
Repeat LFTs

## 2011-06-17 NOTE — Assessment & Plan Note (Signed)
Postprandial epigastric discomfort. EGD last year. Continue Dexilant. Obtain labs.

## 2011-06-17 NOTE — Assessment & Plan Note (Signed)
Worsening esophageal dysphagia over the last 2 months. Discussed with her speech therapist. Patient is able to swallow less than a teaspoon amount at a time with a couple of sips of liquid and food continues to almost immediately come back up. Documented esophageal dysmotility on multiple barium esophagram's. EGD in 2011 without dilation. Will discuss further with Dr. Darrick Penna. Not clear that EGD with dilation would provide much benefit in this situation, this was discussed with both her speech therapist and the patient and patient's granddaughter. However given possible melena, epigastric pain, she may be requiring another EGD. Will await labs. Further recommendations to follow.

## 2011-06-17 NOTE — Assessment & Plan Note (Signed)
Denies typical heartburn symptoms. Unlikely that switch in PPI would improve regurgitation as I feel like this is mostly due to her esophageal dysmotility.

## 2011-06-17 NOTE — Assessment & Plan Note (Signed)
Questionable melena. Recheck H&H. Will have patient do occult blood testing when she has black stool.

## 2011-06-18 ENCOUNTER — Telehealth: Payer: Self-pay

## 2011-06-18 LAB — LIPASE: Lipase: 24 U/L (ref 0–75)

## 2011-06-18 NOTE — Telephone Encounter (Signed)
Irving Burton, speech therapist, called and left VM that the pt is still complaining of vomiting. Said she ate 1/4 sub yesterday and it came back up. She was having a Boost this AM while she was there and was heaving. Said the pt also reported some dark/black diarrhea. Her call back number is [phone].

## 2011-06-18 NOTE — Progress Notes (Signed)
Quick Note:  Spoke to pt dgt, Nettie Elm. See OV note addendum.  H/H stable.  Hold lasix and potassium this weekend. Recheck labs on Monday, Met-7. ______

## 2011-06-18 NOTE — Progress Notes (Signed)
Cc to PCP 

## 2011-06-18 NOTE — Telephone Encounter (Signed)
Spoke with Irving Burton. See addendum to OV note.

## 2011-06-18 NOTE — Progress Notes (Signed)
Addended by: Tiffany Kocher on: 06/18/2011 02:38 PM   Modules accepted: Orders

## 2011-06-18 NOTE — Progress Notes (Signed)
I finally got in touch with Madison Henson (dgt). Advised of need for EGD and labs. Hold K and lasix. They will call on Monday for instructions. Advised to go to ED if decreased urine output, refractory n/v, increased abd pain, overt gi bleed.

## 2011-06-18 NOTE — Progress Notes (Signed)
Spoke with Speech Therapist, Irving Burton for second time. Patient really having trouble getting food and liquid down. Keeping down Boost but vomiting solid foods. ?regurgitating foods before enter stomach. Black water stool on iron. H/H stable but low.   Discussed with Dr. Darrick Penna. Recommend EGD+/-ED on Monday August 6th. I tried to call patient but could not get in touch with her. Called Kim and put patient on the schedule for Tuesday at 11:15, needs to arrive at 10:15. I left a msg for pt to call on Monday morning to get instructions. FYI, initially left msg for her that we were trying to get her scheduled for Monday but I changed it to Tuesday.  I also asked she hold her lasix and potassium this weekend.   Please recheck Met-7 on Monday. Please follow up on this and arrange.

## 2011-06-21 ENCOUNTER — Other Ambulatory Visit: Payer: Self-pay | Admitting: Gastroenterology

## 2011-06-21 ENCOUNTER — Telehealth: Payer: Self-pay

## 2011-06-21 DIAGNOSIS — R1110 Vomiting, unspecified: Secondary | ICD-10-CM

## 2011-06-21 NOTE — Telephone Encounter (Signed)
Thanks

## 2011-06-21 NOTE — Telephone Encounter (Signed)
pts lab order faxed to lab this am. Called lab at 3pm and they have not drawn blood on this pt since last Thursday.  Tried to call pt to see if she had labs drawn and the number was busy.

## 2011-06-21 NOTE — Progress Notes (Signed)
Pt informed

## 2011-06-22 ENCOUNTER — Other Ambulatory Visit: Payer: Self-pay | Admitting: Gastroenterology

## 2011-06-22 ENCOUNTER — Ambulatory Visit (HOSPITAL_COMMUNITY)
Admission: RE | Admit: 2011-06-22 | Discharge: 2011-06-22 | Disposition: A | Discharge status: Home / Self Care | Payer: Medicare Other | Source: Ambulatory Visit | Attending: Gastroenterology | Admitting: Gastroenterology

## 2011-06-22 ENCOUNTER — Encounter (HOSPITAL_COMMUNITY)
Admission: RE | Disposition: A | Discharge status: Home / Self Care | Payer: Self-pay | Source: Ambulatory Visit | Attending: Gastroenterology

## 2011-06-22 DIAGNOSIS — Z01812 Encounter for preprocedural laboratory examination: Secondary | ICD-10-CM | POA: Insufficient documentation

## 2011-06-22 DIAGNOSIS — K222 Esophageal obstruction: Secondary | ICD-10-CM

## 2011-06-22 DIAGNOSIS — R109 Unspecified abdominal pain: Secondary | ICD-10-CM | POA: Insufficient documentation

## 2011-06-22 DIAGNOSIS — R1310 Dysphagia, unspecified: Secondary | ICD-10-CM

## 2011-06-22 DIAGNOSIS — K2970 Gastritis, unspecified, without bleeding: Secondary | ICD-10-CM

## 2011-06-22 DIAGNOSIS — J449 Chronic obstructive pulmonary disease, unspecified: Secondary | ICD-10-CM | POA: Insufficient documentation

## 2011-06-22 DIAGNOSIS — J4489 Other specified chronic obstructive pulmonary disease: Secondary | ICD-10-CM | POA: Insufficient documentation

## 2011-06-22 DIAGNOSIS — Z7982 Long term (current) use of aspirin: Secondary | ICD-10-CM | POA: Insufficient documentation

## 2011-06-22 DIAGNOSIS — K319 Disease of stomach and duodenum, unspecified: Secondary | ICD-10-CM | POA: Insufficient documentation

## 2011-06-22 DIAGNOSIS — I10 Essential (primary) hypertension: Secondary | ICD-10-CM | POA: Insufficient documentation

## 2011-06-22 DIAGNOSIS — Z79899 Other long term (current) drug therapy: Secondary | ICD-10-CM | POA: Insufficient documentation

## 2011-06-22 DIAGNOSIS — E119 Type 2 diabetes mellitus without complications: Secondary | ICD-10-CM | POA: Insufficient documentation

## 2011-06-22 DIAGNOSIS — K2990 Gastroduodenitis, unspecified, without bleeding: Secondary | ICD-10-CM

## 2011-06-22 HISTORY — PX: ESOPHAGOGASTRODUODENOSCOPY: SHX5428

## 2011-06-22 HISTORY — PX: SAVORY DILATION: SHX5439

## 2011-06-22 HISTORY — PX: MALONEY DILATION: SHX5535

## 2011-06-22 LAB — AFB CULTURE WITH SMEAR (NOT AT ARMC)

## 2011-06-22 LAB — GLUCOSE, CAPILLARY: Glucose-Capillary: 98 mg/dL (ref 70–99)

## 2011-06-22 SURGERY — EGD (ESOPHAGOGASTRODUODENOSCOPY)
Anesthesia: Moderate Sedation

## 2011-06-22 MED ORDER — STERILE WATER FOR IRRIGATION IR SOLN
Status: DC | PRN
  Administered 2011-06-22: 11:00:00

## 2011-06-22 MED ORDER — MIDAZOLAM HCL 5 MG/5ML IJ SOLN
INTRAMUSCULAR | Status: AC
  Filled 2011-06-22: qty 10

## 2011-06-22 MED ORDER — MIDAZOLAM HCL 5 MG/5ML IJ SOLN
INTRAMUSCULAR | Status: DC | PRN
  Administered 2011-06-22 (×2): 2 mg via INTRAVENOUS
  Administered 2011-06-22: 1 mg via INTRAVENOUS

## 2011-06-22 MED ORDER — METOCLOPRAMIDE HCL 5 MG PO TABS: ORAL_TABLET | ORAL | Status: DC

## 2011-06-22 MED ORDER — MEPERIDINE HCL 100 MG/ML IJ SOLN
INTRAMUSCULAR | Status: DC | PRN
  Administered 2011-06-22 (×2): 25 mg via INTRAVENOUS

## 2011-06-22 MED ORDER — SODIUM CHLORIDE 0.45 % IV SOLN
Freq: Once | INTRAVENOUS | Status: AC
  Administered 2011-06-22: 10:00:00 via INTRAVENOUS

## 2011-06-22 MED ORDER — BUTAMBEN-TETRACAINE-BENZOCAINE 2-2-14 % EX AERO
INHALATION_SPRAY | CUTANEOUS | Status: DC | PRN
  Administered 2011-06-22: 2 via TOPICAL

## 2011-06-22 MED ORDER — PROMETHAZINE HCL 25 MG/ML IJ SOLN
INTRAMUSCULAR | Status: AC
  Filled 2011-06-22: qty 1

## 2011-06-22 MED ORDER — MEPERIDINE HCL 100 MG/ML IJ SOLN
INTRAMUSCULAR | Status: AC
  Filled 2011-06-22: qty 1

## 2011-06-22 NOTE — Progress Notes (Signed)
Severe dysphagia. Egd/dil Aug 2012

## 2011-06-22 NOTE — Interval H&P Note (Signed)
History and Physical Interval Note:   06/22/2011   10:20 AM   Mellody Memos  has presented today for surgery, with the diagnosis of dysphagia, vomiting, abd pain, ?melena  The various methods of treatment have been discussed with the patient and family. After consideration of risks, benefits and other options for treatment, the patient has consented to  Procedure(s): ESOPHAGOGASTRODUODENOSCOPY (EGD) SAVORY DILATION MALONEY DILATION as a surgical intervention .  I have reviewed the patients' chart and labs.  Questions were answered to the patient's satisfaction.     Jonette Eva  MD

## 2011-06-22 NOTE — Telephone Encounter (Signed)
Please call pt She had EGD today Remind her that she needs to get K checked ASAP if not already done--very important! Thanks

## 2011-06-22 NOTE — H&P (Signed)
  In epic 8/1

## 2011-06-23 NOTE — Telephone Encounter (Signed)
Spoke with pt- she stated she would have it done asap

## 2011-06-24 ENCOUNTER — Telehealth: Payer: Self-pay | Admitting: Gastroenterology

## 2011-06-24 NOTE — Telephone Encounter (Signed)
Results Cc to PCP  

## 2011-06-24 NOTE — Telephone Encounter (Signed)
Please call pt. His stomach Bx shows mild gastritis 2o TO ASA USE. Continue DEXILANT.

## 2011-06-25 ENCOUNTER — Other Ambulatory Visit: Payer: Self-pay | Admitting: Gastroenterology

## 2011-06-26 LAB — BASIC METABOLIC PANEL
CO2: 23 mEq/L (ref 19–32)
Chloride: 109 mEq/L (ref 96–112)
Creat: 1.62 mg/dL — ABNORMAL HIGH (ref 0.50–1.10)

## 2011-06-28 ENCOUNTER — Telehealth: Payer: Self-pay | Admitting: Cardiovascular Disease

## 2011-06-28 NOTE — Telephone Encounter (Signed)
Per pt call, pt has a question about pt taking aspirin. Pt said she thinks aspirin is messing up pt stomach, but she can't stop taking aspirin w/o permission from MD. Please return pt call to advise/discuss.

## 2011-06-28 NOTE — Telephone Encounter (Signed)
Pt informed

## 2011-06-28 NOTE — Telephone Encounter (Signed)
I spoke with the patient and her daughter, Madison Henson. They state that the patient had an endoscopy and her esophagus stretched last Tuesday. She is having pain in her stomach and was told that she had some possible gastritis which may be due to the ASA. She is on a coated 81mg  ASA qd. Dr. Raj Janus did her scope in [place] [phone]). She thinks it may be beneficial for the pt to have a trial off ASA to see if this will help her symptoms. I explained I would forward this message to Dr. Clifton James and his nurse Alphonzo Lemmings to review. They should be calling back by Wednesday. The patient and her daughter are agreeable.

## 2011-06-28 NOTE — Telephone Encounter (Signed)
Routed to DS 

## 2011-06-29 NOTE — Telephone Encounter (Signed)
lmtcb

## 2011-06-29 NOTE — Telephone Encounter (Signed)
OK to hold ASA. Would continue Plavix. Can we let her know? Thanks, chris

## 2011-06-30 NOTE — Telephone Encounter (Signed)
Pt returning call to Whitney. Please call back.

## 2011-06-30 NOTE — Telephone Encounter (Signed)
Patient aware that she may hold Aspirin but needs to remain on Plavix. She is also complaining of mild CP every other day and palpitations and would like to be seen. I have added her on to Dr. Gibson Ramp schedule for Friday 07/02/11.

## 2011-07-01 ENCOUNTER — Encounter (HOSPITAL_COMMUNITY): Payer: Self-pay | Admitting: Gastroenterology

## 2011-07-01 NOTE — Progress Notes (Signed)
Quick Note:  Potassium better but Creatinine still up. Find out if she has started back on Lasix and Potassium.  She needs to f/u with PCP by first of next week to evaluate elevated Creatinine and if there needs to be change in diuretics.  Keep f/u with [facility] to reevaluate dysphagia and anemia. ______

## 2011-07-02 ENCOUNTER — Encounter: Payer: Self-pay | Admitting: Cardiovascular Disease

## 2011-07-02 ENCOUNTER — Ambulatory Visit (INDEPENDENT_AMBULATORY_CARE_PROVIDER_SITE_OTHER): Payer: Medicare Other | Admitting: Cardiovascular Disease

## 2011-07-02 ENCOUNTER — Encounter: Payer: Self-pay | Admitting: Cardiology

## 2011-07-02 VITALS — BP 120/52 | HR 60 | Ht 68.5 in | Wt 214.0 lb

## 2011-07-02 DIAGNOSIS — R079 Chest pain, unspecified: Secondary | ICD-10-CM

## 2011-07-02 DIAGNOSIS — F17200 Nicotine dependence, unspecified, uncomplicated: Secondary | ICD-10-CM

## 2011-07-02 DIAGNOSIS — I2510 Atherosclerotic heart disease of native coronary artery without angina pectoris: Secondary | ICD-10-CM

## 2011-07-02 DIAGNOSIS — R0789 Other chest pain: Secondary | ICD-10-CM | POA: Insufficient documentation

## 2011-07-02 DIAGNOSIS — Z720 Tobacco use: Secondary | ICD-10-CM

## 2011-07-02 LAB — CBC WITH DIFFERENTIAL/PLATELET
Basophils Absolute: 0 10*3/uL (ref 0.0–0.1)
Eosinophils Absolute: 0.2 10*3/uL (ref 0.0–0.7)
MCHC: 32.7 g/dL (ref 30.0–36.0)
MCV: 89 fl (ref 78.0–100.0)
Monocytes Absolute: 0.7 10*3/uL (ref 0.1–1.0)
Neutrophils Relative %: 65.9 % (ref 43.0–77.0)
Platelets: 297 10*3/uL (ref 150.0–400.0)
WBC: 8.8 10*3/uL (ref 4.5–10.5)

## 2011-07-02 LAB — PROTIME-INR
INR: 1 ratio (ref 0.8–1.0)
Prothrombin Time: 11.1 s (ref 10.2–12.4)

## 2011-07-02 LAB — BASIC METABOLIC PANEL
BUN: 17 mg/dL (ref 6–23)
CO2: 27 mEq/L (ref 19–32)
Chloride: 110 mEq/L (ref 96–112)
Creatinine, Ser: 1.7 mg/dL — ABNORMAL HIGH (ref 0.4–1.2)

## 2011-07-02 NOTE — Assessment & Plan Note (Signed)
Smoking cessation encouraged!

## 2011-07-02 NOTE — Progress Notes (Signed)
History of Present Illness:77 yo WF with history of CAD, HTN, Hyperlipidemia, DM, GERD, anxiety/depression here today for cardiac follow up. She has been followed in the past by Dr. Juanda Chance. Primary care is Dr. Dwana Melena. 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 A new DES was also placed at that time. She previously had a Cypher stent in the circumflex artery as well. A catheterization in 05/2009 showed only nonobstructive disease. An event monitor and this showed only isolated PVCs and APCs. She has chronic shortness of breath related to her COPD. She has continued to smoke, 1/2 ppd.  No changes in her breathing.  She has been on ASA and Plavix per Dr. Juanda Chance for lifetime. She had an MRI in June 2011 which showed a 3.7 x 3.5 cm infrarenal aortic aneurysm. She has chronic back pain felt to be secondary lumbar disc bulging but not felt to be a surgical candidate. She also has esophageal strictures and had an esophageal dilatation last week.  She has multiple complaints of joint pain but does not seem to have pain in her thighs or arms. She has been on Simvastatin for years. Her lipids are followed by Dr. Margo Aye.  She tells me today that she has been having pressure in her chest on a dailly basis. She uses her NTG but this does not help. She has slight worsening of her breathing and has been started on oxygen at home. No dizziness, near syncope or syncope.      Past Medical History  Diagnosis Date  . Coronary artery disease     post diaphragmatic wall infarction on 06-2007,due to stent thrombosis   . Chronic obstructive pulmonary disease   . Tobacco abuse   . Hypertension   . Hyperlipidemia   . Diabetes mellitus     with gastroparesis 70% retention at 2 hours  . GERD (gastroesophageal reflux disease)   . Anxiety disorder   . Depression   . Hypothyroidism   . Hx of hysterectomy   . AAA (abdominal aortic aneurysm)   . Pulmonary nodule  04/2011    neg bx, followed by Dr. Edwyna Shell, may still be cancer, being followed with CTs.  . Esophageal dysmotility     Two BPE since 03/2011-->Moderate impairment of esophageal motility.Vallecular residuals. Laryngeal penetration  . Anemia     Past Surgical History  Procedure Date  . Cholecystectomy   . Cataract surg   . Bladder surgery     tack  . Kidney stone surgery   . Coronary stent placement 2004    2  . Coronary stent placement 2008    2  . S/p hysterectomy   . Neck surgery   . Esophagogastroduodenoscopy 5/08    normal esophagus, No H.Pylori  . Egd/bravo 07/2008    On Nexium BID. Day 1 DMSTR Score: 0.7, day 2: DMSTR score: 32, uncontrolled GERD, No H. Pylori  . Hbt 2009    negative  . Colonoscopy 05/2010    tortuous sigm colon, sigm tics, multiple simple adenomas, hemorrhoids, next TCS 10-15 yrs per Dr. Darrick Penna. Previous h/o tubular adenomas in 2008.   Marland Kitchen Esophagogastroduodenoscopy 05/2010    small hh, streaky erythema in body/antrum. Duodenal bx negative.   . Esophagogastroduodenoscopy 06/22/2011    Procedure: ESOPHAGOGASTRODUODENOSCOPY (EGD);  Surgeon: Arlyce Harman, MD;  Location: [facility];  Service: Endoscopy;  Laterality: N/A;  with possible dilation  . Savory dilation 06/22/2011    Procedure:  SAVORY DILATION;  Surgeon: Arlyce Harman, MD;  Location: [facility];  Service: Endoscopy;  Laterality: N/A;  [name] dilation 06/22/2011    Procedure: [name] DILATION;  Surgeon: Arlyce Harman, MD;  Location: [facility];  Service: Endoscopy;  Laterality: N/A;    Current Outpatient Prescriptions  Medication Sig Dispense Refill  . albuterol (PROAIR HFA) 108 (90 BASE) MCG/ACT inhaler Inhale 2 puffs into the lungs every 6 (six) hours as needed.        . ALPRAZolam (XANAX) 0.5 MG tablet Take 0.5 mg by mouth 2 (two) times daily.        Marland Kitchen amLODipine (NORVASC) 5 MG tablet Take 1 tablet (5 mg total) by mouth daily.  30 tablet  6  . aspirin 81 MG tablet Take 81 mg by mouth daily.         . clopidogrel (PLAVIX) 75 MG tablet Take 1 tablet (75 mg total) by mouth daily.  30 tablet  8  . dexlansoprazole (DEXILANT) 60 MG capsule Take 60 mg by mouth daily.        . DULoxetine (CYMBALTA) 60 MG capsule Take 60 mg by mouth daily.        . furosemide (LASIX) 20 MG tablet Take 20 mg by mouth daily.        Marland Kitchen gabapentin (NEURONTIN) 100 MG capsule Take 300 mg by mouth 3 (three) times daily.       Marland Kitchen HYDROcodone-acetaminophen (LORTAB) 7.5-500 MG per tablet 1 tablet 3 (three) times daily as needed.       . hyoscyamine (LEVSIN SL) 0.125 MG SL tablet Place 0.125 mg under the tongue every 4 (four) hours as needed.        Marland Kitchen levothyroxine (SYNTHROID, LEVOTHROID) 112 MCG tablet Take 112 mcg by mouth daily.        Marland Kitchen lisinopril (PRINIVIL,ZESTRIL) 20 MG tablet Take 20 mg by mouth daily.        . metoCLOPramide (REGLAN) 5 MG tablet 1 PO QAC THREE TIMES A DAY  90 tablet  5  . potassium chloride SA (K-DUR,KLOR-CON) 20 MEQ tablet Take 20 mEq by mouth daily.        . simvastatin (ZOCOR) 20 MG tablet Take 1 tablet (20 mg total) by mouth at bedtime.  30 tablet  8  . sitaGLIPtan-metformin (JANUMET) 50-500 MG per tablet Take 1 tablet by mouth 2 (two) times daily with a meal.        . Vitamin D, Ergocalciferol, (DRISDOL) 50000 UNITS CAPS Take 50,000 Units by mouth once a week.          Allergies  Allergen Reactions  . Metoclopramide Hcl     REACTION: stopped aug 2009-pt didn't feel it helped. Pt felt jittery and shaky.  . Sulfonamide Derivatives     History   Social History  . Marital Status: Widowed    Spouse Name: N/A    Number of Children: N/A  . Years of Education: N/A   Occupational History  . Not on file.   Social History Main Topics  . Smoking status: Current Everyday Smoker  . Smokeless tobacco: Not on file   Comment: She smokes 3 cigarretes daily  . Alcohol Use: No  . Drug Use: No  . Sexually Active: Not on file   Other Topics Concern  . Not on file   Social History  Narrative  . No narrative on file    Family History  Problem Relation Age of Onset  . Coronary  artery disease      family hx of  . Colon cancer Sister 21  . Colon cancer Daughter   . Prostate cancer      family hx of  . Lung cancer      family hx of  . Ovarian cancer      family hx of  . Arthritis      family hx of  . Other      family hx of chronic respiratory condition    Review of Systems:  As stated in the HPI and otherwise negative.   BP 120/52  Pulse 60  Ht 5' 8.5" (1.74 m)  Wt 214 lb (97.07 kg)  BMI 32.07 kg/m2  Physical Examination: General: Well developed, well nourished, NAD HEENT: OP clear, mucus membranes moist SKIN: warm, dry. No rashes. Neuro: No focal deficits Musculoskeletal: Muscle strength 5/5 all ext Psychiatric: Mood and affect normal Neck: No JVD, no carotid bruits, no thyromegaly, no lymphadenopathy. Lungs:Clear bilaterally, no wheezes, rhonci, crackles Cardiovascular: Regular rate and rhythm. No murmurs, gallops or rubs. Abdomen:Soft. Bowel sounds present. Non-tender.  Extremities: No lower extremity edema. Pulses are 2 + in the bilateral DP/PT.  EKG:

## 2011-07-02 NOTE — Patient Instructions (Signed)
Your physician recommends that you schedule a follow-up appointment in: 3 weeks with Dr. Clifton James  Your physician has requested that you have a cardiac catheterization. Cardiac catheterization is used to diagnose and/or treat various heart conditions. Doctors may recommend this procedure for a number of different reasons. The most common reason is to evaluate chest pain. Chest pain can be a symptom of coronary artery disease (CAD), and cardiac catheterization can show whether plaque is narrowing or blocking your heart's arteries. This procedure is also used to evaluate the valves, as well as measure the blood flow and oxygen levels in different parts of your heart. For further information please visit [email or web address] Please follow instruction sheet, as given.  You may stop your Aspirin for now.

## 2011-07-02 NOTE — Assessment & Plan Note (Signed)
See above. Plan cath.  

## 2011-07-02 NOTE — Assessment & Plan Note (Signed)
Recent worsening of chest pain with exertion. Breathing is worse. She has not tolerated Lexiscan well in the past. Will arrange left heart cath on 07/08/11 in the outpt cath lab at [facility]. Risks and benefits reviewed with pt. She agrees to proceed. Labs today.

## 2011-07-05 ENCOUNTER — Encounter: Payer: Self-pay | Admitting: Cardiovascular Disease

## 2011-07-05 NOTE — Progress Notes (Signed)
Quick Note:  Pt was infomed. Called Dr. Scharlene Gloss office and spoke with Vickie. Pt can see the PA tomorrow at 11:00 and they can check labs. Pt aware. Faxing over the labs to Dr. Margo Aye. ______

## 2011-07-05 NOTE — Progress Notes (Signed)
Quick Note:  Routing to Leslie Lewis, PA . ______ 

## 2011-07-05 NOTE — Progress Notes (Signed)
Quick Note:  Keep appt with Dr. Margo Aye. He can decide about diuretics. ______

## 2011-07-07 ENCOUNTER — Other Ambulatory Visit: Payer: Self-pay | Admitting: Thoracic Surgery

## 2011-07-07 DIAGNOSIS — D381 Neoplasm of uncertain behavior of trachea, bronchus and lung: Secondary | ICD-10-CM

## 2011-07-08 ENCOUNTER — Inpatient Hospital Stay (HOSPITAL_BASED_OUTPATIENT_CLINIC_OR_DEPARTMENT_OTHER)
Admission: RE | Admit: 2011-07-08 | Discharge: 2011-07-08 | Disposition: A | Discharge status: Home / Self Care | Payer: Medicare Other | Source: Ambulatory Visit | Attending: Cardiovascular Disease | Admitting: Cardiovascular Disease

## 2011-07-08 DIAGNOSIS — E785 Hyperlipidemia, unspecified: Secondary | ICD-10-CM | POA: Insufficient documentation

## 2011-07-08 DIAGNOSIS — R079 Chest pain, unspecified: Secondary | ICD-10-CM | POA: Insufficient documentation

## 2011-07-08 DIAGNOSIS — Z9861 Coronary angioplasty status: Secondary | ICD-10-CM | POA: Insufficient documentation

## 2011-07-08 DIAGNOSIS — F17200 Nicotine dependence, unspecified, uncomplicated: Secondary | ICD-10-CM | POA: Insufficient documentation

## 2011-07-08 DIAGNOSIS — E119 Type 2 diabetes mellitus without complications: Secondary | ICD-10-CM | POA: Insufficient documentation

## 2011-07-08 DIAGNOSIS — I2510 Atherosclerotic heart disease of native coronary artery without angina pectoris: Secondary | ICD-10-CM | POA: Insufficient documentation

## 2011-07-08 DIAGNOSIS — I10 Essential (primary) hypertension: Secondary | ICD-10-CM | POA: Insufficient documentation

## 2011-07-13 NOTE — Cardiovascular Report (Signed)
NAME:  Madison Henson, Madison Henson NO.:  [id number]  MEDICAL RECORD NO.:  [id number]  LOCATION:                                 FACILITY:  PHYSICIAN:  Verne Carrow, MDDATE OF BIRTH:  [birth year]  DATE OF PROCEDURE:  07/08/2011 DATE OF DISCHARGE:                           CARDIAC CATHETERIZATION   PRIMARY CARE PHYSICIAN:  [name], MD  PROCEDURES PERFORMED: 1. Left heart catheterization. 2. Selective coronary angiography. 3. Left ventricular angiogram.  OPERATOR:  Verne Carrow, MD  INDICATIONS:  This is a 77 year old Caucasian female with history of coronary artery disease, hypertension, hyperlipidemia, diabetes mellitus, and tobacco abuse who has been followed in the past by Dr. Juanda Chance.  The patient has a previous history of having drug-eluting stents placed in the mid circumflex artery and in the mid right coronary artery.  I saw her in the office earlier this month and she told me that she had been having daily chest pressure.  She had not tolerated stress test well in the past.  Because of her chest pain and her clinical history, I elected to proceed to a diagnostic catheterization today.  PROCEDURE IN DETAIL:  The patient was brought to the outpatient cardiac catheterization laboratory after signing informed consent for the procedure.  The right groin was prepped and draped in sterile fashion. Lidocaine 1% was used for local anesthesia.  A 4-French sheath was inserted into the right femoral artery without difficulty.  Standard diagnostic catheters were used initially to perform the catheterization. A JL-4 catheter was used to selectively engage and inject the left coronary system.  We ultimately had to use an Amplatz left 1 catheter to engage the native right coronary artery, which had an anterior takeoff. A pigtail catheter was used to perform a left ventricular angiogram. The patient tolerated the procedure well.  There were no  immediate complications.  The patient was taken to the recovery area in stable condition.  HEMODYNAMIC FINDINGS:  Central aortic pressure 103/50.  Left ventricular pressure 106/5/20.  ANGIOGRAPHIC FINDINGS: 1. The left main coronary artery had mild 10% plaque. 2. The left anterior descending was a large vessel that coursed to the     apex and gave off a moderate-sized diagonal branch.  There appeared     to be a 40% stenosis in the ostium of the LAD.  There was mild     plaque in the mid and distal vessel.  The diagonal branch had mild     plaque. 3. The circumflex artery had a patent stent in the midportion of the     vessel.  There was no significant in-stent restenosis.  Just beyond     the stented segment, there was a discrete 40% stenosis.  This did     not appear to be flow-limiting. 4. The right coronary artery had a stent in the midportion of vessel     with no evidence of restenosis.  The remainder of the vessel was     free of any obstructive disease.  Left ventricular angiogram was performed in the RAO projection andshowed low normal left ventricular systolic function with ejection fraction of 50%.  IMPRESSION: 1. Double-vessel coronary artery disease  with patent stents in the mid     right coronary artery and mid circumflex artery. 2. Low normal left ventricular systolic function.  RECOMMENDATIONS:  We will plan on continuing medical management.  No further ischemic workup at this time.     Verne Carrow, MD     CM/MEDQ  D:  07/08/2011  T:  07/08/2011  Job:  [id number]  cc:   [name], M.D.  Electronically Signed by Verne Carrow MD on 07/13/2011 03:15:49 PM

## 2011-07-26 ENCOUNTER — Encounter: Payer: Self-pay | Admitting: Cardiovascular Disease

## 2011-07-27 ENCOUNTER — Ambulatory Visit: Payer: Medicare Other | Admitting: Cardiovascular Disease

## 2011-07-29 ENCOUNTER — Ambulatory Visit (INDEPENDENT_AMBULATORY_CARE_PROVIDER_SITE_OTHER): Payer: Medicare Other | Admitting: Cardiovascular Disease

## 2011-07-29 ENCOUNTER — Encounter: Payer: Self-pay | Admitting: Cardiovascular Disease

## 2011-07-29 VITALS — BP 142/65 | HR 74 | Ht 69.0 in | Wt 215.0 lb

## 2011-07-29 DIAGNOSIS — I2510 Atherosclerotic heart disease of native coronary artery without angina pectoris: Secondary | ICD-10-CM

## 2011-07-29 NOTE — Assessment & Plan Note (Signed)
Stable. Cath 07/09/11 with stable disease. No chest pain. Continue current meds. Lipids followed in primary care. Smoking cessation encouraged.

## 2011-07-29 NOTE — Patient Instructions (Signed)
Your physician wants you to follow-up in: 6 months  You will receive a reminder letter in the mail two months in advance. If you don't receive a letter, please call our office to schedule the follow-up appointment.  Your physician recommends that you continue on your current medications as directed. Please refer to the Current Medication list given to you today.  

## 2011-07-29 NOTE — Progress Notes (Signed)
History of Present Illness:77 yo WF with history of CAD, HTN, Hyperlipidemia, DM, GERD, anxiety/depression here today for cardiac follow up. She has been followed in the past by Dr. Juanda Chance. Primary care is Dr. Dwana Melena. 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 A new DES was also placed at that time. She previously had a Cypher stent in the circumflex artery as well. A catheterization in 05/2009 showed only nonobstructive disease. An event monitor and this showed only isolated PVCs and APCs. She has chronic shortness of breath related to her COPD. She has continued to smoke, 1/2 ppd.  No changes in her breathing. She has been on ASA and Plavix per Dr. Juanda Chance for lifetime. She had an MRI in June 2011 which showed a 3.7 x 3.5 cm infrarenal aortic aneurysm. She has chronic back pain felt to be secondary lumbar disc bulging but not felt to be a surgical candidate. She also has esophageal strictures and had an esophageal dilatation last week. She has multiple complaints of joint pain but does not seem to have pain in her thighs or arms. She has been on Simvastatin for years. Her lipids are followed by Dr. Margo Aye. I saw her several weeks ago and she c/o daily chest pain. I arranged a left heart cath on 07/09/11. This showed mild to moderate stable disease (see below).   She tells me today that she has had no chest pain. Her breathing has been at baseline. She has been having trouble sleeping and has had problems with GERD. Dr. Margo Aye stopped her Lisinopril last week and started a new med but she has not started yet and not sure of name. She is due to f/u with Dr. Edwyna Shell for pulmonary nodule.    Past Medical History  Diagnosis Date  . Coronary artery disease     post diaphragmatic wall infarction on 06-2007,due to stent thrombosis   . Chronic obstructive pulmonary disease   . Tobacco abuse   . Hypertension   . Hyperlipidemia   . Diabetes mellitus       with gastroparesis 70% retention at 2 hours  . GERD (gastroesophageal reflux disease)   . Anxiety disorder   . Depression   . Hypothyroidism   . Hx of hysterectomy   . AAA (abdominal aortic aneurysm)   . Pulmonary nodule 04/2011    neg bx, followed by Dr. Edwyna Shell, may still be cancer, being followed with CTs.  . Esophageal dysmotility     Two BPE since 03/2011-->Moderate impairment of esophageal motility.Vallecular residuals. Laryngeal penetration  . Anemia     Past Surgical History  Procedure Date  . Cholecystectomy   . Cataract surg   . Bladder surgery     tack  . Kidney stone surgery   . Coronary stent placement 2004    2  . Coronary stent placement 2008    2  . S/p hysterectomy   . Neck surgery   . Esophagogastroduodenoscopy 5/08    normal esophagus, No H.Pylori  . Egd/bravo 07/2008    On Nexium BID. Day 1 DMSTR Score: 0.7, day 2: DMSTR score: 32, uncontrolled GERD, No H. Pylori  . Hbt 2009    negative  . Colonoscopy 05/2010    tortuous sigm colon, sigm tics, multiple simple adenomas, hemorrhoids, next TCS 10-15 yrs per Dr. Darrick Penna. Previous h/o tubular adenomas in 2008.   Marland Kitchen Esophagogastroduodenoscopy 05/2010    small hh, streaky erythema in body/antrum. Duodenal  bx negative.   . Esophagogastroduodenoscopy 06/22/2011    Procedure: ESOPHAGOGASTRODUODENOSCOPY (EGD);  Surgeon: Arlyce Harman, MD;  Location: [facility];  Service: Endoscopy;  Laterality: N/A;  with possible dilation  . Savory dilation 06/22/2011    Procedure: SAVORY DILATION;  Surgeon: Arlyce Harman, MD;  Location: [facility];  Service: Endoscopy;  Laterality: N/A;  [name] dilation 06/22/2011    Procedure: [name] DILATION;  Surgeon: Arlyce Harman, MD;  Location: [facility];  Service: Endoscopy;  Laterality: N/A;    Current Outpatient Prescriptions  Medication Sig Dispense Refill  . albuterol (PROAIR HFA) 108 (90 BASE) MCG/ACT inhaler Inhale 2 puffs into the lungs every 6 (six) hours as needed.         . ALPRAZolam (XANAX) 0.5 MG tablet Take 0.5 mg by mouth 2 (two) times daily.        Marland Kitchen amLODipine (NORVASC) 5 MG tablet Take 1 tablet (5 mg total) by mouth daily.  30 tablet  6  . aspirin 81 MG tablet Take 81 mg by mouth daily.        . clopidogrel (PLAVIX) 75 MG tablet Take 1 tablet (75 mg total) by mouth daily.  30 tablet  8  . dexlansoprazole (DEXILANT) 60 MG capsule Take 60 mg by mouth daily.        . DULoxetine (CYMBALTA) 60 MG capsule Take 60 mg by mouth daily.        . furosemide (LASIX) 20 MG tablet Take 20 mg by mouth daily.        Marland Kitchen gabapentin (NEURONTIN) 100 MG capsule Take 300 mg by mouth 3 (three) times daily.       Marland Kitchen HYDROcodone-acetaminophen (LORTAB) 7.5-500 MG per tablet 1 tablet 3 (three) times daily as needed.       . hyoscyamine (LEVSIN SL) 0.125 MG SL tablet Place 0.125 mg under the tongue every 4 (four) hours as needed.        Marland Kitchen levothyroxine (SYNTHROID, LEVOTHROID) 112 MCG tablet Take 112 mcg by mouth daily.        Marland Kitchen lisinopril (PRINIVIL,ZESTRIL) 20 MG tablet Take 20 mg by mouth daily.        . metoCLOPramide (REGLAN) 5 MG tablet 1 PO QAC THREE TIMES A DAY  90 tablet  5  . potassium chloride SA (K-DUR,KLOR-CON) 20 MEQ tablet Take 20 mEq by mouth daily.        . simvastatin (ZOCOR) 20 MG tablet Take 1 tablet (20 mg total) by mouth at bedtime.  30 tablet  8  . Vitamin D, Ergocalciferol, (DRISDOL) 50000 UNITS CAPS Take 50,000 Units by mouth once a week.          Allergies  Allergen Reactions  . Metoclopramide Hcl     REACTION: stopped aug 2009-pt didn't feel it helped. Pt felt jittery and shaky.  . Sulfonamide Derivatives     History   Social History  . Marital Status: Widowed    Spouse Name: N/A    Number of Children: N/A  . Years of Education: N/A   Occupational History  . Not on file.   Social History Main Topics  . Smoking status: Current Everyday Smoker  . Smokeless tobacco: Not on file   Comment: She smokes 3 cigarretes daily  . Alcohol Use: No  .  Drug Use: No  . Sexually Active: Not on file   Other Topics Concern  . Not on file   Social History Narrative  . No  narrative on file    Family History  Problem Relation Age of Onset  . Coronary artery disease      family hx of  . Colon cancer Sister 63  . Colon cancer Daughter   . Prostate cancer      family hx of  . Lung cancer      family hx of  . Ovarian cancer      family hx of  . Arthritis      family hx of  . Other      family hx of chronic respiratory condition    Review of Systems:  As stated in the HPI and otherwise negative.   BP 142/65  Pulse 74  Ht 5\' 9"  (1.753 m)  Wt 215 lb (97.523 kg)  BMI 31.75 kg/m2  Physical Examination: General: Well developed, well nourished, NAD HEENT: OP clear, mucus membranes moist SKIN: warm, dry. No rashes. Neuro: No focal deficits Musculoskeletal: Muscle strength 5/5 all ext Psychiatric: Mood and affect normal Neck: No JVD, no carotid bruits, no thyromegaly, no lymphadenopathy. Lungs:Decreased, Clear bilaterally, no wheezes, rhonci, crackles Cardiovascular: Regular rate and rhythm. No murmurs, gallops or rubs. Abdomen:Soft. Bowel sounds present. Non-tender.  Extremities: No lower extremity edema. Pulses are trace to 1 + in the bilateral DP/PT.    Cardiac Cath 07/09/11:  1. The left main coronary artery had mild 10% plaque.   2. The left anterior descending was a large vessel that coursed to the       apex and gave off a moderate-sized diagonal branch.  There appeared       to be a 40% stenosis in the ostium of the LAD.  There was mild       plaque in the mid and distal vessel.  The diagonal branch had mild       plaque.   3. The circumflex artery had a patent stent in the midportion of the       vessel.  There was no significant in-stent restenosis.  Just beyond       the stented segment, there was a discrete 40% stenosis.  This did       not appear to be flow-limiting.   4. The right coronary artery had a stent  in the midportion of vessel       with no evidence of restenosis.  The remainder of the vessel was       free of any obstructive disease.      Left ventricular angiogram was performed in the RAO projection and showed low normal left ventricular systolic function with  ejection   fraction of 50%.

## 2011-08-03 ENCOUNTER — Ambulatory Visit (INDEPENDENT_AMBULATORY_CARE_PROVIDER_SITE_OTHER): Payer: Medicare Other | Admitting: Thoracic Surgery

## 2011-08-03 ENCOUNTER — Ambulatory Visit
Admission: RE | Admit: 2011-08-03 | Discharge: 2011-08-03 | Disposition: A | Discharge status: Home / Self Care | Payer: Medicare Other | Source: Ambulatory Visit | Attending: Thoracic Surgery | Admitting: Thoracic Surgery

## 2011-08-03 ENCOUNTER — Encounter: Payer: Self-pay | Admitting: Thoracic Surgery

## 2011-08-03 ENCOUNTER — Other Ambulatory Visit: Payer: Self-pay | Admitting: Thoracic Surgery

## 2011-08-03 VITALS — BP 140/74 | HR 88 | Resp 20 | Ht 69.0 in | Wt 215.0 lb

## 2011-08-03 DIAGNOSIS — J984 Other disorders of lung: Secondary | ICD-10-CM

## 2011-08-03 DIAGNOSIS — D381 Neoplasm of uncertain behavior of trachea, bronchus and lung: Secondary | ICD-10-CM

## 2011-08-03 NOTE — Progress Notes (Signed)
HPI this patient had a right lower lobe lesion with a standard uptake value of 3.9 on PET scan. Bronchoscopy was negative. All biopsies were negative. We elected to followup in 3 months. Followup CT scan shows that the nodule has increased in size. We plan to get a middle biopsy of this nodule. If positive we will proceed with resection. Since she is on Plavix that will need to be discontinued.   Current Outpatient Prescriptions  Medication Sig Dispense Refill  . albuterol (PROAIR HFA) 108 (90 BASE) MCG/ACT inhaler Inhale 2 puffs into the lungs every 6 (six) hours as needed.        . ALPRAZolam (XANAX) 0.5 MG tablet Take 0.5 mg by mouth 2 (two) times daily.        Marland Kitchen amLODipine (NORVASC) 5 MG tablet Take 1 tablet (5 mg total) by mouth daily.  30 tablet  6  . clopidogrel (PLAVIX) 75 MG tablet Take 1 tablet (75 mg total) by mouth daily.  30 tablet  8  . dexlansoprazole (DEXILANT) 60 MG capsule Take 60 mg by mouth daily.        . DULoxetine (CYMBALTA) 60 MG capsule Take 60 mg by mouth daily.        Marland Kitchen gabapentin (NEURONTIN) 100 MG capsule Take 300 mg by mouth 3 (three) times daily.       Marland Kitchen HYDROcodone-acetaminophen (LORTAB) 7.5-500 MG per tablet 1 tablet 3 (three) times daily as needed.       . hyoscyamine (LEVSIN SL) 0.125 MG SL tablet Place 0.125 mg under the tongue every 4 (four) hours as needed.        Marland Kitchen levothyroxine (SYNTHROID, LEVOTHROID) 112 MCG tablet Take 112 mcg by mouth daily.        . metoCLOPramide (REGLAN) 5 MG tablet 1 PO QAC THREE TIMES A DAY  90 tablet  5  . simvastatin (ZOCOR) 20 MG tablet Take 1 tablet (20 mg total) by mouth at bedtime.  30 tablet  8  . Vitamin D, Ergocalciferol, (DRISDOL) 50000 UNITS CAPS Take 50,000 Units by mouth once a week.           Review of Systems: Unchanged  Physical Exam  Cardiovascular: Normal rate, regular rhythm, normal heart sounds and intact distal pulses.   Pulmonary/Chest: Effort normal and breath sounds normal. No respiratory distress.      Diagnostic Tests: Chest CT scan shows an enlarging right lower lobe nodule.   Impression: Probable non-small cell lung cancer right lower lobe  Plan: Needle biopsy right lower lobe possible resection

## 2011-08-04 ENCOUNTER — Ambulatory Visit: Payer: Medicare Other | Admitting: Gastroenterology

## 2011-08-05 LAB — DIFFERENTIAL
Basophils Relative: 1
Eosinophils Absolute: 0.1
Monocytes Absolute: 0.4
Monocytes Relative: 6
Neutro Abs: 5.4

## 2011-08-05 LAB — CBC
Platelets: 263
RDW: 14.7

## 2011-08-05 LAB — COMPREHENSIVE METABOLIC PANEL
ALT: 8
Albumin: 3.7
Alkaline Phosphatase: 67
Potassium: 3.4 — ABNORMAL LOW
Sodium: 142
Total Protein: 6.4

## 2011-08-05 LAB — URINE MICROSCOPIC-ADD ON

## 2011-08-05 LAB — URINALYSIS, ROUTINE W REFLEX MICROSCOPIC
Glucose, UA: NEGATIVE
Nitrite: NEGATIVE
Protein, ur: NEGATIVE
Urobilinogen, UA: 0.2

## 2011-08-05 LAB — POCT CARDIAC MARKERS
Myoglobin, poc: 52.3
Troponin i, poc: <0.05

## 2011-08-08 ENCOUNTER — Emergency Department (HOSPITAL_COMMUNITY): Payer: Medicare Other

## 2011-08-08 ENCOUNTER — Inpatient Hospital Stay (HOSPITAL_COMMUNITY)
Admission: EM | Admit: 2011-08-08 | Discharge: 2011-08-12 | DRG: 313 | Disposition: A | Discharge status: Home Health | Payer: Medicare Other | Attending: Internal Medicine | Admitting: Internal Medicine

## 2011-08-08 DIAGNOSIS — N289 Disorder of kidney and ureter, unspecified: Secondary | ICD-10-CM | POA: Diagnosis present

## 2011-08-08 DIAGNOSIS — E785 Hyperlipidemia, unspecified: Secondary | ICD-10-CM | POA: Diagnosis present

## 2011-08-08 DIAGNOSIS — I714 Abdominal aortic aneurysm, without rupture, unspecified: Secondary | ICD-10-CM | POA: Diagnosis present

## 2011-08-08 DIAGNOSIS — Z8042 Family history of malignant neoplasm of prostate: Secondary | ICD-10-CM

## 2011-08-08 DIAGNOSIS — Z8249 Family history of ischemic heart disease and other diseases of the circulatory system: Secondary | ICD-10-CM

## 2011-08-08 DIAGNOSIS — Z801 Family history of malignant neoplasm of trachea, bronchus and lung: Secondary | ICD-10-CM

## 2011-08-08 DIAGNOSIS — Z8261 Family history of arthritis: Secondary | ICD-10-CM

## 2011-08-08 DIAGNOSIS — Z7902 Long term (current) use of antithrombotics/antiplatelets: Secondary | ICD-10-CM

## 2011-08-08 DIAGNOSIS — R079 Chest pain, unspecified: Secondary | ICD-10-CM

## 2011-08-08 DIAGNOSIS — C3430 Malignant neoplasm of lower lobe, unspecified bronchus or lung: Secondary | ICD-10-CM | POA: Diagnosis present

## 2011-08-08 DIAGNOSIS — Z79899 Other long term (current) drug therapy: Secondary | ICD-10-CM

## 2011-08-08 DIAGNOSIS — D509 Iron deficiency anemia, unspecified: Secondary | ICD-10-CM | POA: Diagnosis present

## 2011-08-08 DIAGNOSIS — Z800 Family history of malignant neoplasm of digestive organs: Secondary | ICD-10-CM

## 2011-08-08 DIAGNOSIS — E039 Hypothyroidism, unspecified: Secondary | ICD-10-CM | POA: Diagnosis present

## 2011-08-08 DIAGNOSIS — E1149 Type 2 diabetes mellitus with other diabetic neurological complication: Secondary | ICD-10-CM | POA: Diagnosis present

## 2011-08-08 DIAGNOSIS — Z8041 Family history of malignant neoplasm of ovary: Secondary | ICD-10-CM

## 2011-08-08 DIAGNOSIS — Z882 Allergy status to sulfonamides status: Secondary | ICD-10-CM

## 2011-08-08 DIAGNOSIS — J4489 Other specified chronic obstructive pulmonary disease: Secondary | ICD-10-CM | POA: Diagnosis present

## 2011-08-08 DIAGNOSIS — Z9861 Coronary angioplasty status: Secondary | ICD-10-CM

## 2011-08-08 DIAGNOSIS — K3184 Gastroparesis: Secondary | ICD-10-CM | POA: Diagnosis present

## 2011-08-08 DIAGNOSIS — I252 Old myocardial infarction: Secondary | ICD-10-CM

## 2011-08-08 DIAGNOSIS — I2510 Atherosclerotic heart disease of native coronary artery without angina pectoris: Secondary | ICD-10-CM | POA: Diagnosis present

## 2011-08-08 DIAGNOSIS — Z860100 Personal history of colon polyps, unspecified: Secondary | ICD-10-CM

## 2011-08-08 DIAGNOSIS — F341 Dysthymic disorder: Secondary | ICD-10-CM | POA: Diagnosis present

## 2011-08-08 DIAGNOSIS — E669 Obesity, unspecified: Secondary | ICD-10-CM | POA: Diagnosis present

## 2011-08-08 DIAGNOSIS — I498 Other specified cardiac arrhythmias: Secondary | ICD-10-CM | POA: Diagnosis not present

## 2011-08-08 DIAGNOSIS — J9610 Chronic respiratory failure, unspecified whether with hypoxia or hypercapnia: Secondary | ICD-10-CM | POA: Diagnosis present

## 2011-08-08 DIAGNOSIS — N3090 Cystitis, unspecified without hematuria: Secondary | ICD-10-CM | POA: Diagnosis present

## 2011-08-08 DIAGNOSIS — R0789 Other chest pain: Principal | ICD-10-CM | POA: Diagnosis present

## 2011-08-08 DIAGNOSIS — J449 Chronic obstructive pulmonary disease, unspecified: Secondary | ICD-10-CM | POA: Diagnosis present

## 2011-08-08 DIAGNOSIS — K219 Gastro-esophageal reflux disease without esophagitis: Secondary | ICD-10-CM | POA: Diagnosis present

## 2011-08-08 DIAGNOSIS — F17200 Nicotine dependence, unspecified, uncomplicated: Secondary | ICD-10-CM | POA: Diagnosis present

## 2011-08-08 DIAGNOSIS — I10 Essential (primary) hypertension: Secondary | ICD-10-CM | POA: Diagnosis present

## 2011-08-08 LAB — DIFFERENTIAL
Lymphocytes Relative: 25 % (ref 12–46)
Lymphs Abs: 2 10*3/uL (ref 0.7–4.0)
Neutro Abs: 5 10*3/uL (ref 1.7–7.7)
Neutrophils Relative %: 61 % (ref 43–77)

## 2011-08-08 LAB — CBC
HCT: 34.6 % — ABNORMAL LOW (ref 36.0–46.0)
Hemoglobin: 11.2 g/dL — ABNORMAL LOW (ref 12.0–15.0)
MCV: 91.5 fL (ref 78.0–100.0)
Platelets: 249 10*3/uL (ref 150–400)
RBC: 3.78 MIL/uL — ABNORMAL LOW (ref 3.87–5.11)
WBC: 8.2 10*3/uL (ref 4.0–10.5)

## 2011-08-08 LAB — POCT I-STAT TROPONIN I

## 2011-08-09 ENCOUNTER — Inpatient Hospital Stay (HOSPITAL_COMMUNITY): Payer: Medicare Other

## 2011-08-09 DIAGNOSIS — R079 Chest pain, unspecified: Secondary | ICD-10-CM

## 2011-08-09 DIAGNOSIS — R7989 Other specified abnormal findings of blood chemistry: Secondary | ICD-10-CM

## 2011-08-09 DIAGNOSIS — D381 Neoplasm of uncertain behavior of trachea, bronchus and lung: Secondary | ICD-10-CM

## 2011-08-09 LAB — HEPATIC FUNCTION PANEL
ALT: 12 U/L (ref 0–35)
AST: 15 U/L (ref 0–37)
Alkaline Phosphatase: 59 U/L (ref 39–117)
Bilirubin, Direct: <0.1 mg/dL (ref 0.0–0.3)

## 2011-08-09 LAB — CBC
HCT: 34.4 % — ABNORMAL LOW (ref 36.0–46.0)
Hemoglobin: 10.9 g/dL — ABNORMAL LOW (ref 12.0–15.0)
MCH: 29.4 pg (ref 26.0–34.0)
MCHC: 31.7 g/dL (ref 30.0–36.0)

## 2011-08-09 LAB — CARDIAC PANEL(CRET KIN+CKTOT+MB+TROPI)
Relative Index: INVALID (ref 0.0–2.5)
Relative Index: INVALID (ref 0.0–2.5)
Relative Index: INVALID (ref 0.0–2.5)
Total CK: 69 U/L (ref 7–177)
Troponin I: 0.37 ng/mL (ref <0.30)

## 2011-08-09 LAB — GLUCOSE, CAPILLARY
Glucose-Capillary: 95 mg/dL (ref 70–99)
Glucose-Capillary: 95 mg/dL (ref 70–99)
Glucose-Capillary: 112 mg/dL — ABNORMAL HIGH (ref 70–99)

## 2011-08-09 LAB — BASIC METABOLIC PANEL
BUN: 16 mg/dL (ref 6–23)
BUN: 17 mg/dL (ref 6–23)
CO2: 26 mEq/L (ref 19–32)
Chloride: 106 mEq/L (ref 96–112)
Chloride: 105 mEq/L (ref 96–112)
Creatinine, Ser: 1.29 mg/dL — ABNORMAL HIGH (ref 0.50–1.10)
Creatinine, Ser: 1.24 mg/dL — ABNORMAL HIGH (ref 0.50–1.10)
GFR calc Af Amer: 48 mL/min — ABNORMAL LOW (ref >60)

## 2011-08-09 MED ORDER — IOHEXOL 300 MG/ML  SOLN
100.0000 mL | Freq: Once | INTRAMUSCULAR | Status: AC | PRN
  Administered 2011-08-09: 100 mL via INTRAVENOUS

## 2011-08-10 ENCOUNTER — Telehealth: Payer: Self-pay | Admitting: Cardiovascular Disease

## 2011-08-10 LAB — GLUCOSE, CAPILLARY
Glucose-Capillary: 116 mg/dL — ABNORMAL HIGH (ref 70–99)
Glucose-Capillary: 113 mg/dL — ABNORMAL HIGH (ref 70–99)
Glucose-Capillary: 106 mg/dL — ABNORMAL HIGH (ref 70–99)

## 2011-08-10 LAB — BASIC METABOLIC PANEL
CO2: 23 mEq/L (ref 19–32)
Calcium: 9.3 mg/dL (ref 8.4–10.5)
Creatinine, Ser: 1.15 mg/dL — ABNORMAL HIGH (ref 0.50–1.10)

## 2011-08-10 LAB — CBC
MCH: 30.4 pg (ref 26.0–34.0)
MCV: 89.8 fL (ref 78.0–100.0)
Platelets: 169 10*3/uL (ref 150–400)
RDW: 14.5 % (ref 11.5–15.5)

## 2011-08-10 LAB — CARDIAC PANEL(CRET KIN+CKTOT+MB+TROPI): Total CK: 85 U/L (ref 7–177)

## 2011-08-10 NOTE — Telephone Encounter (Signed)
Pt is at [facility] and daughter has some questions about her condition.  Nurses are telling her one thing and then other tell her something else.  Please call her with some answers.

## 2011-08-11 ENCOUNTER — Other Ambulatory Visit: Payer: Self-pay | Admitting: Interventional Radiology

## 2011-08-11 ENCOUNTER — Inpatient Hospital Stay (HOSPITAL_COMMUNITY): Payer: Medicare Other

## 2011-08-11 ENCOUNTER — Other Ambulatory Visit (HOSPITAL_COMMUNITY): Payer: Medicare Other

## 2011-08-11 LAB — CBC
HCT: 31.4 % — ABNORMAL LOW (ref 36.0–46.0)
Hemoglobin: 10.1 g/dL — ABNORMAL LOW (ref 12.0–15.0)
MCH: 29.4 pg (ref 26.0–34.0)
MCHC: 32.2 g/dL (ref 30.0–36.0)
RDW: 14.6 % (ref 11.5–15.5)

## 2011-08-11 LAB — GLUCOSE, CAPILLARY
Glucose-Capillary: 122 mg/dL — ABNORMAL HIGH (ref 70–99)
Glucose-Capillary: 133 mg/dL — ABNORMAL HIGH (ref 70–99)

## 2011-08-11 LAB — BASIC METABOLIC PANEL
BUN: 18 mg/dL (ref 6–23)
CO2: 29 mEq/L (ref 19–32)
Chloride: 104 mEq/L (ref 96–112)
Creatinine, Ser: 1.18 mg/dL — ABNORMAL HIGH (ref 0.50–1.10)

## 2011-08-11 NOTE — Telephone Encounter (Signed)
Spoke with pt's daughter who has questions regarding pt's hospitalization. Daughter is presently at hospital with pt.  I asked daughter to discuss this with nurse's at hospital and if she has additional questions she should ask the nurses to contact the PA at the hospital. Daughter agreeable with this plan.

## 2011-08-12 LAB — CBC
HCT: 30.7 % — ABNORMAL LOW (ref 36.0–46.0)
MCHC: 31.9 g/dL (ref 30.0–36.0)
MCV: 92.5 fL (ref 78.0–100.0)
RDW: 14.7 % (ref 11.5–15.5)
WBC: 6.3 10*3/uL (ref 4.0–10.5)

## 2011-08-12 LAB — URINE MICROSCOPIC-ADD ON

## 2011-08-12 LAB — BASIC METABOLIC PANEL
BUN: 21 mg/dL (ref 6–23)
Chloride: 104 mEq/L (ref 96–112)
Creatinine, Ser: 1.36 mg/dL — ABNORMAL HIGH (ref 0.50–1.10)
GFR calc Af Amer: 45 mL/min — ABNORMAL LOW (ref >60)

## 2011-08-12 LAB — GLUCOSE, CAPILLARY: Glucose-Capillary: 91 mg/dL (ref 70–99)

## 2011-08-12 LAB — URINALYSIS, ROUTINE W REFLEX MICROSCOPIC
Nitrite: NEGATIVE
Specific Gravity, Urine: 1.017 (ref 1.005–1.030)
Urobilinogen, UA: 0.2 mg/dL (ref 0.0–1.0)

## 2011-08-13 LAB — URINE CULTURE: Special Requests: POSITIVE

## 2011-08-16 HISTORY — PX: OTHER SURGICAL HISTORY: SHX169

## 2011-08-18 ENCOUNTER — Encounter: Payer: Self-pay | Admitting: Thoracic Surgery

## 2011-08-18 ENCOUNTER — Ambulatory Visit (INDEPENDENT_AMBULATORY_CARE_PROVIDER_SITE_OTHER): Payer: Medicare Other | Admitting: Thoracic Surgery

## 2011-08-18 VITALS — BP 126/77 | HR 84 | Resp 20 | Ht 69.0 in | Wt 215.0 lb

## 2011-08-18 DIAGNOSIS — C3490 Malignant neoplasm of unspecified part of unspecified bronchus or lung: Secondary | ICD-10-CM

## 2011-08-18 NOTE — Progress Notes (Signed)
HPI this 77 year old patient returns for followup. He'll biopsy of the right lower lobe lesion revealed non-small cell lung cancer probably squamous cell cancer. He still continues to smoke but is trying to stop. I her pulmonary function tests showed an FVC of 1,8-49% of predicted and an FEV1 of 1.46for  51% of predicted. Her diffusion capacity was 48%. I have scheduled her for surgery for October 19. I have explained the risk of the procedure particularly with are pending pulmonary function test to her she and her daughter agreed to the surgery and understand the risk. I hope to do a right lower lobe basilar segmentectomy. She is to stop Plavix 5 days prior to procedure. Current Outpatient Prescriptions  Medication Sig Dispense Refill  . albuterol (PROAIR HFA) 108 (90 BASE) MCG/ACT inhaler Inhale 2 puffs into the lungs every 6 (six) hours as needed.        . ALPRAZolam (XANAX) 0.5 MG tablet Take 0.5 mg by mouth 2 (two) times daily.        Marland Kitchen amLODipine (NORVASC) 5 MG tablet Take 1 tablet (5 mg total) by mouth daily.  30 tablet  6  . atorvastatin (LIPITOR) 20 MG tablet Take 20 mg by mouth daily.        . clopidogrel (PLAVIX) 75 MG tablet Take 1 tablet (75 mg total) by mouth daily.  30 tablet  8  . dexlansoprazole (DEXILANT) 60 MG capsule Take 60 mg by mouth daily.        Marland Kitchen gabapentin (NEURONTIN) 100 MG capsule Take 300 mg by mouth 3 (three) times daily.       Marland Kitchen HYDROcodone-acetaminophen (LORTAB) 7.5-500 MG per tablet 1 tablet 3 (three) times daily as needed.       . hyoscyamine (LEVSIN SL) 0.125 MG SL tablet Place 0.125 mg under the tongue every 4 (four) hours as needed.        Marland Kitchen levothyroxine (SYNTHROID, LEVOTHROID) 112 MCG tablet Take 112 mcg by mouth daily.        . metoCLOPramide (REGLAN) 5 MG tablet 1 PO QAC THREE TIMES A DAY  90 tablet  5  . Vitamin D, Ergocalciferol, (DRISDOL) 50000 UNITS CAPS Take 50,000 Units by mouth once a week.        . DULoxetine (CYMBALTA) 60 MG capsule Take 60 mg by  mouth daily.        . simvastatin (ZOCOR) 20 MG tablet Take 1 tablet (20 mg total) by mouth at bedtime.  30 tablet  8     Review of Systems: Continued right chest wall pain   Physical Exam  Cardiovascular: Normal rate, regular rhythm and normal heart sounds.   Pulmonary/Chest: Effort normal and breath sounds normal. No respiratory distress.     Diagnostic Tests: Needle biopsy revealed non-small cell lung cancer   Impression: Probable stage IB non-small cell lung cancer right lower lobe   Plan: Right lower lobe basilar segmentectomy

## 2011-08-18 NOTE — H&P (Signed)
NAME:  Madison Henson, Madison Henson NO.:  [id number]  MEDICAL RECORD NO.:  [id number]  LOCATION:  3735                         FACILITY:  [facility]  PHYSICIAN:  Carlota Raspberry, MD         DATE OF BIRTH:  May 22, 1928  DATE OF ADMISSION:  08/08/2011 DATE OF DISCHARGE:                             HISTORY & PHYSICAL   PRIMARY CARE PHYSICIAN:  [name], MD  CHIEF COMPLAINT:  Chest pain.  HISTORY OF PRESENT ILLNESS:  This is an 77 year old female with a history of CAD, status post stents to mid left circ and mid RCA; hypertension; hyperlipidemia; present smoker; presently being worked up for enlarging right lower lobe pulmonary nodule who presents with chest pain.  Of note, the patient just had a cath about 1 month ago due to chest pain which showed a 10% left main, a 40% LAD ostial, patent stents in the left circ and in the mid RCA, and a 40% discrete left circ with a preserved EF of 50% and no interventions were done with no plan for further ischemic workup.  She woke up with chest pain earlier tonight and was brought to the emergency room where her initial vital signs were 103/60, pulse 67, 18, and 98.2.  Her course in the emergency room was fairly unremarkable including troponin negative x1 and EKG with nonspecific T-wave inversions in V2 and V3.  The ED spoke with Dr. Gala Romney, who recommended admission to Medicine for further workup unless troponins become positive.  On evaluation, the patient is still having a little bit of deep left lower sternal border pain that is pleuritic in nature and that it is worsened when she breathes in deeply.  She describes it as sharp like a knife is stabbing her.  These are the same types of chest pains for which she got a catheterization just a month ago, and also of note, the patient is being worked up for a pulmonary nodule, but this is in the right base.  There are no associated symptoms at present and review of systems is, otherwise,  negative.  PAST MEDICAL HISTORY: 1. Coronary artery disease, status post stents to her mid left     circumflex and mid RCA.  She also has had in-stent thrombosis in     2008 after discontinuation of aspirin and Plavix because of concern     for bleeding but had repeat cath on July 08, 2009, which showed     no flow-limiting lesions or in-stent restenosis 2. Sigmoid diverticulosis, multiple polyps in July 2011 colonoscopy     with negative biopsy for malignancy. 3. GERD. 4. Persistent dysphagia. 5. COPD. 6. Persistent smoking history and being actively worked up for right     lower lobe nodule that is about 1 x 2 cm. 7. Anxiety depression. 8. Hypertension. 9. Hyperlipidemia. 10.Hypothyroidism. 11.Diabetes type 2 with gastroparesis and 70% retention at 2 hours.  Medication list was reconciled by the pharmacist includes: 1. Vitamin D2 50,000 units on Mondays. 2. Linagliptin 5 mg daily. 3. Synthroid 112 mcg 1 tablet daily. 4. Simvastatin 40 mg daily. 5. Albuterol inhaler. 6. Cymbalta 60 mg daily. 7. Amlodipine 5 mg daily. 8.  Reglan 5 mg three times a day. 9. Hyoscyamine 0.125 mg every 4 hours as needed for GI upset. 10.Vicodin. 11.Gabapentin 100 mg 2 times a day. 12.Dexilant 60 mg daily. 13.Plavix 75 mg.  Of note, the patient has stop taken this in the     short term for procedure on August 11, 2011. 14.Alprazolam 0.5 mg 1 tablet twice a day. 15.Vitamin B12. 16.Cyanocobalamin 1 injection monthly.  ALLERGIES LISTED:  SULFA.  SOCIAL HISTORY:  She lives with her daughter.  She is retired formally in the [employer].  She continues to smoke a quarter of packet per day.  Denies alcohol or illicit drug use.  FAMILY HISTORY:  Sister had a colon cancer in the late 10s.  Her daughter has had colon cancer.  There is history of prostate and lung and ovarian cancers in her family and also heart disease and arthritis.  PHYSICAL EXAMINATION:  GENERAL:  She is a  large lady lying in the hospital stretcher.  Her voice sounds consistent with a very long time smoker and that it is very hoarse and harsh. HEENT:  Her pupils are equal, round.  Her extraocular muscles intact. Her sclerae are a bit injected, but otherwise, normal appearing.  Her mouth is moist to normal appearing.  No oropharyngeal lesions of note. CARDIOPULMONARY:  Her heart has inspiratory coarse, rough sounding breath sounds with diffuse expiratory wheezing throughout.  Her heart is a bit difficult to hear, but it is regular rate and rhythm with no murmurs or gallops appreciated.  Her bilateral radials are palpable. Her lung sounds and her wheezing are audible over her breath sounds. ABDOMEN:  Obese but is soft, nontender, nondistended and benign. EXTREMITIES:  Warm, well perfused with no bilateral lower extremity edema. SKIN:  Very rough and leathery throughout. NEUROLOGIC:  There are no focal neurological deficits noted.  LABORATORY WORK:  White blood cell count is 8.2, hematocrit 34.6, platelets 249.  Chemistry panel unremarkable including renal function of 16 and 1.29 which is at its baseline.  Her LFTs are normal.  Her calcium is 10.1, lipase is 28.  A troponin is 0.03.  She had a chest x-ray showing a known 2.6 right lower lobe pulmonary nodule, not well characterized on chest radiograph due to overlying structures.  There is a mid left basilar opacity, likely reflecting atelectasis.  Of note, she had a August 03, 2011, CT chest showing an enlarging right lower lobe pulmonary nodule, now measuring 1.7 x 2.6 cm contacting the pleural surface.  EKG today shows normal sinus rhythm with 67 beats per minute with left axis and possibly left anterior fascicular block.  She has a P-mitrale II.  Her QRSs are wide at 122 in an IVCD pattern.  There are no frank ST- T segment changes, but she does appear to have new T-wave inversion in II and biphasic Ts in V3 compared to prior in  May 2012, otherwise, unchanged.  IMPRESSION:  This is an 77 year old female with a significant coronary history including the left circumflex and right coronary artery stenting, status post catheterization last month with no flow-limiting lesions or in-stent restenosis, hypertension, hyperlipidemia, diabetes, chronic obstructive pulmonary disease, and persistent smoking, currently being worked up for right lower lobe lung nodule concerning for malignancy, who presents with chest pain. 1. Chest pain.  She has atypical features in a very high risk patient     and new nonspecific EKG changes.  However, her troponin currently     is negative.  I question whether her lung malignancy that is     abutting her pleura is also the culprit given the pleuritic nature     of her chest pain. Regardless for now, we will admit her for rule out myocardial infarction protocol and get an EKG in the morning.  We will continue aspirin 325 and hold her Plavix for now given her med list states that she was supposed to take this but stopped taking this on the short term for procedure on August 11, 2011, and I am not convinced that she is having cardiac chest pain any ways. We will continue her other cardiac medicines including simvastatin and amlodipine. 1. Hypothyroidism.  We will continue home levothyroxine. 2. Chronic obstructive pulmonary disease.  We will put her in for     albuterol and ipratropium nebs given her wheezy lung sounds. 3. We will continue the rest of her home medication regimen as well. 4. We will put her in for tobacco cessation consultation. 5. Fluid, electrolytes, nutrition.  She does not need any IV fluids     for now; put her in for regular, heart-healthy, diabetic diet; and     give her sliding scale insulin. 6. Prophylaxis.  Subcutaneous heparin, Colace, senna, and Tylenol for     pain with IV morphine for breakthrough. 7. IV access.  She has a peripheral. 8. Code status  presumed full. 9. She will be admitted to Telemetry [facility] Team 6.          ______________________________ Carlota Raspberry, MD     EB/MEDQ  D:  08/09/2011  T:  08/09/2011  Job:  [id number]  Electronically Signed by Carlota Raspberry MD on 08/18/2011 12:23:03 PM

## 2011-08-18 NOTE — Consult Note (Signed)
NAMEMarland Kitchen  Madison Henson, Madison Henson NO.:  [id number]  MEDICAL RECORD NO.:  [id number]  LOCATION:  3735                         FACILITY:  [facility]  PHYSICIAN:  Ronie Spies, P.A.C.     DATE OF BIRTH:  1927-12-11  DATE OF CONSULTATION:  08/09/2011 DATE OF DISCHARGE:                                CONSULTATION   PRIMARY CARDIOLOGIST:  Verne Carrow MD  PRIMARY MEDICAL DOCTOR:  [name], MD  CARDIOTHORACIC SURGEON:  Ines Bloomer, MD  CHIEF COMPLAINT:  Chest pain.  REASON FOR CONSULTATION:  Abnormal cardiac enzymes.  HISTORY OF PRESENT ILLNESS:  Madison Henson is an 77 year old female with a history of CAD, diabetes, COPD who is admitted with chest pain.  She has been recently followed for a right lower lobe lung nodule suspicious for a non-small-cell lung cancer and is scheduled for a biopsy on August 11, 2011.  Her Plavix is on hold.  She was seen in the office in August and was complaining of daily chest pain, and catheterization showed mild- to-moderate stable CAD, nonobstructive with medical therapy recommended. She will return to [facility] overnight with complaints of chest pain.  Approximately 2 o'clock in the morning, the chest pain woke her out of sleep.  She describes it as sharp and severe, like a knife sensation, left-sided with markedly increased pain on inspiration.  She has had some shortness of breath although she has baseline shortness of breath and uses oxygen p.r.n.Marland Kitchen  She denies any significant lower extremity edema, orthopnea, but does note that she has been more fatigued lately.  First set of cardiac enzymes were negative x2, then third set returned troponin of 0.37 with negative CKs and MBs.  The patient denies any current chest pain, just feels tired in general.  Of note, on telemetry she also had a wide-complex tachycardia at about 125 beats per minute, regular, with subtle T-waves noted at this time felt to be SVT.  The patient  was asymptomatic.  PAST MEDICAL HISTORY: 1. CAD.     a.     Previous mid and ostial circumflex stent, placed first stent      to the proximal mid RCA.     b.     In 2008, she sustained an inferior MI secondary to in-stent      thrombosis and distal plaque status post Promus drug-eluting stent      placement to the RCA.     c.     Nonobstructive CAD by cath in July 2011 following dobutamine      Myoview in which she experienced ventricular tachycardia during      her stress test.  Outpatient event monitor showed PAC and PVCs.     d.     Last catheterization on July 08, 2011, with patent stents. 2. Diabetes mellitus with gastroparesis. 3. Hypertension. 4. Hyperlipidemia. 5. GERD. 6. History of dysphagia. 7. COPD, uses p.r.n. oxygen 3 times a day and at night. 8. Nephrolithiasis. 9. History of hypothyroidism. 10.Sigmoid diverticulosis and polyps with colonoscopy in July 2011     negative for malignancy. 11.Esophageal stricture, status post recent dilatation. 12.Right lower lobe nodule suspicious for non-small-cell lung cancer,  followed by Madison Henson.  Negative biopsy in June 2006 and 2012.     Negative biopsy in May 11, 2011, however, with increasing growth     still felt suspicious for malignancy. 13.Iron-deficiency anemia. 14.Anxiety/depression. 15.EF of 50% by cath on August 23. 16.Left bundle-branch block. 17.Abdominal aortic aneurysm 3.7 x 3.5 cm in June 2011.  SURGICAL HISTORY: 1. Hysterectomy. 2. Cholecystectomy. 3. Bladder surgery. 4. Neck surgery.  MEDICATIONS: 1. Albuterol 2.5 mg inhaled q.6 h. 2. Xanax 0.5 mg b.i.d. 3. Norvasc 5 mg daily. 4. Aspirin 325 mg daily. 5. Colace 100 mg b.i.d. 6. Cymbalta 6 mg daily. 7. Vitamin B on Mondays. 8. Neurontin 100 mg t.i.d. 9. Heparin 5000 units q.8 h. 10.Sliding-scale insulin. 11.Atrovent inhaled q.6 h. 12.Synthroid 112 mcg daily. 13.Reglan 5 mg t.i.d. 14.Percocet x1 dose. 15.Protonix 40 mg daily. 16.Zocor  40 mg.  Please note her Plavix was held in anticipation for a needle-biopsy of her right lower lobe nodule scheduled for August 11, 2011 with Madison Henson.  ALLERGIES:  SULFA.  SOCIAL HISTORY:  Madison Henson lives with her daughter.  She is an ongoing smoker and smokes quater-a-pack per day.  She denies any alcohol or drug use.  FAMILY HISTORY:  Positive for coronary disease in her mother.  She has a sibling and a daughter who both had colon cancer.  REVIEW OF SYSTEMS:  No fevers, chills, palpitations.  She has had no lower extremity edema or orthopnea.  Positive for nausea and some dry heaving, but this is an occasional problem for the patient and it has not changed.  See HPI for pertinent positives.  All other systems reviewed and otherwise negative.  LABORATORY DATA:  WBC 6.6, hemoglobin 10.9, hematocrit 34.4, platelet count 210.  Sodium 142, potassium 4.6, chloride 105, CO2 of 26, glucose 91, BUN 16, creatinine 1.24.  LFTs were normal with exception of albumin 3.4, lipase normal at 28.  First time CK and troponin was negative, second full set of cardiac enzymes was negative, then third set returned CK 6.9 and MB 3.5, troponin 0.37.  EKG:  Normal sinus rhythm with nonspecific interventricular conduction delay with T-wave inversion at V2 and V3 as well as aVL.  RADIOLOGY STUDIES:  Chest x-ray showed a known 2.6 cm right lower lobe pulmonary nodule, not well characterized, still worrisome for malignancy.  Mild left basilar opacity likely reflecting atelectasis.  PHYSICAL EXAMINATION:  VITAL SIGNS:  Temperature 97.8, pulse 71, respirations 20, blood pressure 130/74, pulse ox 95% on 2 liters, room air check is 92% on room air. GENERAL:  This is a pleasant elderly white female in no acute distress. HEENT:  Normocephalic, atraumatic.  Extraocular muscles intact.  Clear sclerae.  Nares are without discharge.  She speaks with a hoarseness quality to her voice. NECK:  Supple  without carotid bruit or JVD. HEART:  Auscultation to the heart reveals regular rate and rhythm with S1 and S2 with no murmurs, rubs, or gallops. CHEST:  She is tender below the left breast, but the palpation did not reproduce the same pain that brought her in. LUNGS:  Sounds are coarse with occasional rhonchi diffusely end- expiratory wheezes, but decreased breath sounds at bases.  ABDOMEN: Soft, nontender, nondistended.  Positive bowel sounds.  No hepatomegaly. EXTREMITIES:  Warm and dry with 2+ pedal pulses bilaterally.  She has trace sock-line edema. NEUROLOGIC:  She is alert and oriented x3, responds to questions appropriately.  She does have a rhythmic tic of mouth movement  that disappears when  the patient is distracted.  ASSESSMENT AND PLAN:  The patient was seen and examined by myself and Dr. Tenny Craw.  This is an 77 year old female with a history of coronary disease with recent cath showing patent stents, diabetes mellitus, chronic obstructive pulmonary disease, and right lower lobe lung nodule concerning for a non-small-cell lung cancer, who presents with an episode of chest pain which at this time is felt atypical in nature. Initial cardiac markers were negative and the patient was going to be discharged; however, third set returned with troponin of 0.37.  This is of unclear clinical significance and we will obtain another set.  Given her pleuritic symptoms and possible malignancy, the pain may be related to her lung nodule, although pulmonary embolism is also a consideration. We will obtain a stat D-dimer and would consider CT angio to rule out pulmonary embolism if it is positive.  We doubt acute coronary syndrome and would recommend to continue to treat her coronary disease medically. She does not appear to be on beta-blocker at this time, presumably secondary to her lung disease.  We will change her Zocor to Lipitor given the amlodipine interaction.  We would also recommend  for the patient to follow up with her primary doctor regarding her gastroparesis, as her abnormal muscle movements are consistent with tardive dyskinesia may be related to Reglan.  Lastly, the wide-complex tachycardia on telemetry appears to be an supraventricular tachycardia at this time.  The patient was asymptomatic and prior event monitor only showed premature ventricular contractions and premature atrial contractions, so I would continue to follow from a symptom standpoint.  Thank you for the opportunity to participate in the care of this patient.     Trebor Galdamez, P.A.C.     DD/MEDQ  D:  08/09/2011  T:  08/09/2011  Job:  [id number]  cc:   Verne Carrow, MD [name], M.D.  Electronically Signed by Ronie Spies  on 08/18/2011 09:23:33 PM

## 2011-08-24 LAB — BASIC METABOLIC PANEL
BUN: 13
CO2: 26
Chloride: 107
Creatinine, Ser: 1.03

## 2011-08-24 LAB — CULTURE, BLOOD (ROUTINE X 2)

## 2011-08-24 LAB — CBC
MCHC: 33
MCV: 90.8
Platelets: 255
RBC: 4

## 2011-08-24 LAB — DIFFERENTIAL
Basophils Relative: 1
Eosinophils Absolute: 0.1 — ABNORMAL LOW
Eosinophils Relative: 1
Monocytes Relative: 9
Neutrophils Relative %: 72

## 2011-08-25 LAB — CBC
HCT: 32 — ABNORMAL LOW
MCHC: 33.9
MCV: 92.3
RBC: 3.47 — ABNORMAL LOW

## 2011-08-27 LAB — LIPID PANEL
HDL: 22 — ABNORMAL LOW
Total CHOL/HDL Ratio: 3.7
Triglycerides: 93
VLDL: 19

## 2011-08-27 LAB — CARDIAC PANEL(CRET KIN+CKTOT+MB+TROPI)
CK, MB: 1.1
CK, MB: 1.3
CK, MB: 1.4
Relative Index: INVALID
Relative Index: INVALID
Total CK: 38
Total CK: 53
Total CK: 45

## 2011-08-27 LAB — CBC
HCT: 34.7 — ABNORMAL LOW
HCT: 29.7 — ABNORMAL LOW
HCT: 31 — ABNORMAL LOW
HCT: 32.3 — ABNORMAL LOW
Hemoglobin: 11.7 — ABNORMAL LOW
Hemoglobin: 10.5 — ABNORMAL LOW
Hemoglobin: 11 — ABNORMAL LOW
MCHC: 33.7
MCHC: 34
MCHC: 34.4
MCV: 93.7
MCV: 91.9
MCV: 92.1
MCV: 93.1
Platelets: 248
Platelets: 176
Platelets: 195
Platelets: 195
RBC: 3.71 — ABNORMAL LOW
RBC: 3.37 — ABNORMAL LOW
RDW: 14
RDW: 13.3
RDW: 13.6
WBC: 6.8
WBC: 11.4 — ABNORMAL HIGH
WBC: 6.6

## 2011-08-27 LAB — POCT I-STAT 3, ART BLOOD GAS (G3+)
Bicarbonate: 22.8
Operator id: 284701
pH, Arterial: 7.354
pO2, Arterial: 73 — ABNORMAL LOW

## 2011-08-27 LAB — POCT I-STAT 3, VENOUS BLOOD GAS (G3P V)
O2 Saturation: 66
pCO2, Ven: 42.7 — ABNORMAL LOW
pH, Ven: 7.321 — ABNORMAL HIGH

## 2011-08-27 LAB — DIFFERENTIAL
Basophils Absolute: 0.1
Basophils Relative: 1
Eosinophils Absolute: 0.2
Eosinophils Relative: 3
Lymphocytes Relative: 30
Lymphs Abs: 2
Monocytes Absolute: 0.5
Monocytes Relative: 7
Neutro Abs: 4.1
Neutrophils Relative %: 60

## 2011-08-27 LAB — I-STAT 8, (EC8 V) (CONVERTED LAB)
BUN: 12
Chloride: 107
Glucose, Bld: 80
Hemoglobin: 12.6
Potassium: 3.5
Sodium: 139
pH, Ven: 7.435 — ABNORMAL HIGH

## 2011-08-27 LAB — B-NATRIURETIC PEPTIDE (CONVERTED LAB): Pro B Natriuretic peptide (BNP): 79

## 2011-08-27 LAB — COMPREHENSIVE METABOLIC PANEL WITH GFR
ALT: 13
AST: 11
Albumin: 2.8 — ABNORMAL LOW
Alkaline Phosphatase: 59
BUN: 9
CO2: 24
Calcium: 8.4
Chloride: 111
Creatinine, Ser: 0.67
GFR calc non Af Amer: >60
Glucose, Bld: 87
Potassium: 3.7
Sodium: 143
Total Bilirubin: 0.6
Total Protein: 5 — ABNORMAL LOW

## 2011-08-27 LAB — CK TOTAL AND CKMB (NOT AT ARMC)
CK, MB: 1.7
Relative Index: INVALID
Relative Index: INVALID
Total CK: 57

## 2011-08-27 LAB — HEPARIN LEVEL (UNFRACTIONATED)

## 2011-08-27 LAB — BASIC METABOLIC PANEL
BUN: 14
BUN: 22
CO2: 25
Chloride: 108
Chloride: 110
Creatinine, Ser: 1.02
Glucose, Bld: 107 — ABNORMAL HIGH
Potassium: 4.1
Sodium: 141

## 2011-08-27 LAB — POCT CARDIAC MARKERS
CKMB, poc: <1 — ABNORMAL LOW
CKMB, poc: <1 — ABNORMAL LOW
Myoglobin, poc: 64.7
Operator id: 146091
Operator id: 294501
Troponin i, poc: <0.05
Troponin i, poc: <0.05

## 2011-08-27 LAB — TROPONIN I

## 2011-08-27 LAB — POCT I-STAT CREATININE
Creatinine, Ser: 1
Operator id: 146091

## 2011-08-27 LAB — PROTIME-INR
INR: 1.1
Prothrombin Time: 13.9

## 2011-08-27 LAB — MAGNESIUM: Magnesium: 2.1

## 2011-08-27 LAB — TSH: TSH: 2.128

## 2011-08-30 LAB — DIFFERENTIAL
Basophils Absolute: 0
Eosinophils Absolute: 0.1
Eosinophils Relative: 1
Lymphocytes Relative: 21
Lymphs Abs: 1.9
Lymphs Abs: 2.2
Monocytes Absolute: 0.5
Monocytes Relative: 8
Neutro Abs: 7.8 — ABNORMAL HIGH
Neutrophils Relative %: 68

## 2011-08-30 LAB — BASIC METABOLIC PANEL
BUN: 16
CO2: 24
Chloride: 106
Chloride: 110
GFR calc non Af Amer: >60
Glucose, Bld: 102 — ABNORMAL HIGH
Glucose, Bld: 97
Potassium: 3.7
Potassium: 4
Sodium: 138
Sodium: 141

## 2011-08-30 LAB — CBC
HCT: 33.2 — ABNORMAL LOW
Hemoglobin: 11.3 — ABNORMAL LOW
Platelets: 221
RBC: 3.56 — ABNORMAL LOW
RBC: 3.92
RDW: 13.5
RDW: 13.7
WBC: 10.6 — ABNORMAL HIGH
WBC: 8.2
WBC: 8.5

## 2011-08-30 LAB — LIPID PANEL
Cholesterol: 123
LDL Cholesterol: 62
Triglycerides: 163 — ABNORMAL HIGH
VLDL: 33

## 2011-08-30 LAB — COMPREHENSIVE METABOLIC PANEL
ALT: 20
AST: 15
AST: 9
Albumin: 3 — ABNORMAL LOW
BUN: 17
Calcium: 8.1 — ABNORMAL LOW
Calcium: 8.5
Creatinine, Ser: 0.82
GFR calc Af Amer: >60
GFR calc Af Amer: >60
GFR calc non Af Amer: >60
Glucose, Bld: 79
Sodium: 140
Total Bilirubin: 0.4
Total Protein: 6.3

## 2011-08-30 LAB — CK TOTAL AND CKMB (NOT AT ARMC)
CK, MB: 1
CK, MB: 2.2
Relative Index: INVALID
Relative Index: INVALID
Total CK: 73

## 2011-08-30 LAB — TROPONIN I: Troponin I: 0.03

## 2011-08-30 LAB — CARDIAC PANEL(CRET KIN+CKTOT+MB+TROPI)
CK, MB: 68.6 — ABNORMAL HIGH
Relative Index: 12.5 — ABNORMAL HIGH
Troponin I: 29.92

## 2011-08-30 LAB — APTT: aPTT: 24

## 2011-08-30 LAB — HEMOGLOBIN A1C: Hgb A1c MFr Bld: 5.5

## 2011-08-30 LAB — PROTIME-INR: INR: 0.9

## 2011-08-30 LAB — B-NATRIURETIC PEPTIDE (CONVERTED LAB): Pro B Natriuretic peptide (BNP): 143 — ABNORMAL HIGH

## 2011-08-30 LAB — POCT I-STAT CREATININE
Creatinine, Ser: 0.8
Operator id: 222701

## 2011-09-01 ENCOUNTER — Other Ambulatory Visit: Payer: Self-pay | Admitting: Thoracic Surgery

## 2011-09-01 ENCOUNTER — Encounter (HOSPITAL_COMMUNITY)
Admission: RE | Admit: 2011-09-01 | Discharge: 2011-09-01 | Disposition: A | Discharge status: Home / Self Care | Payer: Medicare Other | Source: Ambulatory Visit | Attending: Thoracic Surgery | Admitting: Thoracic Surgery

## 2011-09-01 DIAGNOSIS — C3490 Malignant neoplasm of unspecified part of unspecified bronchus or lung: Secondary | ICD-10-CM

## 2011-09-01 LAB — URINE MICROSCOPIC-ADD ON

## 2011-09-01 LAB — COMPREHENSIVE METABOLIC PANEL
ALT: 9 U/L (ref 0–35)
AST: 14 U/L (ref 0–37)
Alkaline Phosphatase: 82 U/L (ref 39–117)
CO2: 25 mEq/L (ref 19–32)
GFR calc Af Amer: 51 mL/min — ABNORMAL LOW (ref >90)
GFR calc non Af Amer: 44 mL/min — ABNORMAL LOW (ref >90)
Glucose, Bld: 100 mg/dL — ABNORMAL HIGH (ref 70–99)
Potassium: 4.6 mEq/L (ref 3.5–5.1)
Sodium: 141 mEq/L (ref 135–145)
Total Protein: 6.6 g/dL (ref 6.0–8.3)

## 2011-09-01 LAB — URINALYSIS, ROUTINE W REFLEX MICROSCOPIC
Glucose, UA: NEGATIVE mg/dL
Ketones, ur: NEGATIVE mg/dL
pH: 7 (ref 5.0–8.0)

## 2011-09-01 LAB — CBC
Hemoglobin: 11.4 g/dL — ABNORMAL LOW (ref 12.0–15.0)
MCH: 29.5 pg (ref 26.0–34.0)
RBC: 3.86 MIL/uL — ABNORMAL LOW (ref 3.87–5.11)

## 2011-09-01 LAB — SURGICAL PCR SCREEN: Staphylococcus aureus: POSITIVE — AB

## 2011-09-01 NOTE — Discharge Summary (Signed)
NAME:  Madison Henson, Madison Henson NO.:  [id number]  MEDICAL RECORD NO.:  [id number]  LOCATION:  3735                         FACILITY:  [facility]  PHYSICIAN:  Madison Scott, MD     DATE OF BIRTH:  [birth year]  DATE OF ADMISSION:  08/08/2011 DATE OF DISCHARGE:  08/12/2011                              DISCHARGE SUMMARY   PRIMARY CARE PHYSICIAN:  Madison Pizza, MD  CARDIOTHORACIC SURGEON:  Madison Bloomer, MD  DISCHARGE DIAGNOSES: 1. Atypical/noncardiac chest pain.  Likely musculoskeletal in origin.     Resolved. 2. Right lower lobe lung nodule, status post CT-guided biopsy by     Interventional Radiology on August 11, 2011.  Likely lung     cancer. 3. Tobacco abuse. 4. Chronic obstructive pulmonary disease. 5. Anemia. 6. Uncomplicated cystitis. 7. Chronic respiratory failure, on home oxygen. 8. Coronary artery disease, status post percutaneous coronary     intervention. 9. Gastroesophageal reflux disease. 10.Hypertension. 11.Hypothyroidism. 12.Hyperlipidemia. 13.Type 2 diabetes mellitus with gastroparesis. 14.History of depression.  DISCHARGE MEDICATIONS: 1. Lipitor 20 mg p.o. daily. 2. Cipro 250 mg p.o. b.i.d. for 3 days. 3. Alprazolam 0.5 mg p.o. b.i.d. 4. Amlodipine 5 mg p.o. daily. 5. Dexilant 60 mg p.o. daily. 6. Gabapentin 100 mg p.o. t.i.d. 7. Hydrocodone/acetaminophen 7.5/500 mg, one tablet p.o. q.6 hourly     p.r.n. pain. 8. Hyoscyamine 0.125 mg sublingual q.4 hourly p.r.n. for GI upset. 9. Metoclopramide 5 mg p.o. t.i.d. 10.Plavix 75 mg p.o. daily, to be resumed on August 13, 2011. 11.ProAir two puffs inhaled q.4 hourly p.r.n. for dyspnea. 12.Synthroid 112 mcg p.o. daily. 13.Trajenta 5 mg p.o. daily. 14.Vitamin B12 one injection subcutaneously monthly. 15.Vitamin D2 50,000 units p.o. on Mondays weekly.  DISCONTINUED MEDICATIONS: 1. Simvastatin. 2. Cymbalta, which she had voluntarily stopped long time ago.  PROCEDURES:  CT fluoroscopic-guided  right lower lobe nodule FNA biopsy by Madison Henson on August 11, 2011.  IMAGING: 1. Chest x-ray, post lung nodule biopsy on August 11, 2011:     Impression:     a.     Increase in right lower lobe airspace opacity may suggest      postprocedure layering pleural and/or parenchymal hemorrhage.     b.     No right-sided pneumothorax next. 2. Chest x-ray on August 11, 2011:  Impression:  No postprocedural     pneumothorax. 3. CT angiogram of the chest with contrast on August 09, 2011:     Impression no evidence for acute cardiopulmonary embolism up to and     including the third-order pulmonary arteries, allowing for some     decrease in sensitivity due to heterogeneity of contrast bolus.  Apparent interval enlargement of previously seen right lower lobe pulmonary nodule.  This could represent in part effect from possible recent biopsy and/or progression of disease. 1. Chest x-ray on August 09, 2011:  Impression:     a.     Known 2.6-cm right lower lobe pulmonary nodule is not well      characterized on chest x-ray due to overlying structures.  This      was assessed on recent CT and remains concerning for malignancy.     b.  Mild left basilar opacity likely reflects atelectasis.  Lung nodule biopsy cytopathology pending.  LABORATORY DATA:  BMET on August 12, 2011; creatinine of 1.36, otherwise within normal limits.  CBC; hemoglobin 9.8, hematocrit 31, white blood cell 6.3, platelets 206.  Urinalysis showed 11-20 white blood cells and few bacteria.  Cardiac enzymes were cycled and only significant for troponin, which was elevated x1 at 0.37.  The rest of the cardiac enzymes were negative.  Hepatic panel only significant for albumin of 3.4, lipase 28.  CONSULTATIONS: 1. Cardiology, Dr. Dietrich Henson, [facility]. 2. Phone consultation with Dr. Cameron Henson. 3. Interventional Radiology, Madison Henson.  TODAY'S COMPLAINT:  The patient had her  right lower lobe pulmonary nodule biopsy yesterday.  Last night, she complained of some pleuritic- type of right-sided 8-9/10 chest pain.  She coughed up a minimal amount of blood x1 last night, but none since then.  She denies any dyspnea and she indicates that her chest pain is much better today.  She has no further anterior chest pain that she had on admission.  PHYSICAL EXAMINATION:  GENERAL:  The patient is in no obvious distress. VITAL SIGNS:  Temperature is 98.6 degrees Fahrenheit, pulse 70 per minute and regular, respirations 20 per minute, blood pressure 115/71 mmHg and saturating at 94% on 2 liters of oxygen. RESPIRATORY SYSTEM:  Clear.  No pleural rub or crackles or wheezing or rhonchi.  No increased work of breathing. CARDIOVASCULAR SYSTEM:  First and second heart sounds heard regular. ABDOMEN:  Nondistended, nontender, soft and normal bowel sounds heard. CENTRAL NERVOUS SYSTEM:  The patient is awake, alert, and oriented x3 with no focal neurological deficits. EXTREMITIES:  With grade 5/5 power.  HOSPITAL COURSE:  Madison Henson is an 77 year old female patient with history of coronary artery disease, status post PCI; hypertension; hyperlipidemia; ongoing smoking; being evaluated as an outpatient for right lower lobe pulmonary nodule.  Her Plavix had been held couple of days prior to admission for biopsy.  She now presented with atypical chest pain.  She indicated chest pain in the epigastric region, which was made worse by bending forward.  This chest pain was suggestive of musculoskeletal etiology.  She was almost being discharged home when her second set of cardiac enzymes revealed elevated troponin of 0.37. Cardiology was consulted.  They were not convinced that this was cardiac origin chest pain.  They performed CT of the chest with contrast, which did not reveal a pulmonary embolism, but it did continue to show the right lower lobe pulmonary nodule.  They cleared her  from a cardiac standpoint for discharge home with no further workup.  However, the patient had low lung nodule biopsy set up for the next day as an outpatient, so the patient was kept in the hospital per the family's request.  She had her right lower lobe nodule biopsy by Interventional Radiology yesterday.  It apparently showed malignant cells, but the pathology is pending.  The findings were discussed with her Cardiothoracic Surgeon, Dr. Edwyna Shell by the interventional radiologist. As indicated above, she had brief episode of right-sided chest pain and an episode of coughing up of minimal amount of blood.  Her chest pain has improved and she denies any dyspnea and she has no further hemoptysis.  Interventional Radiology team has seen her today and cleared her for discharge from an IR standpoint.  They also recommend waiting until tomorrow before resuming the Plavix.  The patient also complained of new onset dysuria, but no fevers  or chills.  She indicates that she had stopped taking Cymbalta for some time secondary to some side effects of it causing tremors.  This can be addressed as an outpatient by her primary care physician.  At this time, the patient has no sad or depressed feelings.  She does not volunteer to any suicidal orhomicidal ideations or delusions or hallucinations.  She has a very pleasant affect.  DISPOSITION:  The patient is discharged to home in stable condition.  FOLLOWUP RECOMMENDATIONS: 1. With Dr. Cameron Henson.  The patient is to call for an     appointment.  This dictator called and updated Dr. Edwyna Shell who will     follow her up as an outpatient. 2. With Dr. Catalina Henson.  The patient is to call for an appointment.  Time taken in coordinating this discharge is 35 minutes.     Madison Scott, MD     AH/MEDQ  D:  08/12/2011  T:  08/12/2011  Job:  [id number]  cc:   Madison Henson, M.D. Madison Henson, M.D.  Electronically Signed by Madison Scott MD on  09/01/2011 07:48:30 AM

## 2011-09-03 ENCOUNTER — Other Ambulatory Visit: Payer: Self-pay | Admitting: Thoracic Surgery

## 2011-09-03 ENCOUNTER — Inpatient Hospital Stay (HOSPITAL_COMMUNITY)
Admission: RE | Admit: 2011-09-03 | Discharge: 2011-09-08 | DRG: 164 | Disposition: A | Discharge status: Home Health | Payer: Medicare Other | Source: Ambulatory Visit | Attending: Thoracic Surgery | Admitting: Thoracic Surgery

## 2011-09-03 ENCOUNTER — Inpatient Hospital Stay (HOSPITAL_COMMUNITY): Payer: Medicare Other

## 2011-09-03 DIAGNOSIS — E785 Hyperlipidemia, unspecified: Secondary | ICD-10-CM | POA: Diagnosis present

## 2011-09-03 DIAGNOSIS — E039 Hypothyroidism, unspecified: Secondary | ICD-10-CM | POA: Diagnosis present

## 2011-09-03 DIAGNOSIS — D62 Acute posthemorrhagic anemia: Secondary | ICD-10-CM | POA: Diagnosis not present

## 2011-09-03 DIAGNOSIS — J4489 Other specified chronic obstructive pulmonary disease: Secondary | ICD-10-CM | POA: Diagnosis present

## 2011-09-03 DIAGNOSIS — F341 Dysthymic disorder: Secondary | ICD-10-CM | POA: Diagnosis present

## 2011-09-03 DIAGNOSIS — J449 Chronic obstructive pulmonary disease, unspecified: Secondary | ICD-10-CM | POA: Diagnosis present

## 2011-09-03 DIAGNOSIS — K3184 Gastroparesis: Secondary | ICD-10-CM | POA: Diagnosis present

## 2011-09-03 DIAGNOSIS — I2510 Atherosclerotic heart disease of native coronary artery without angina pectoris: Secondary | ICD-10-CM | POA: Diagnosis present

## 2011-09-03 DIAGNOSIS — Z9861 Coronary angioplasty status: Secondary | ICD-10-CM

## 2011-09-03 DIAGNOSIS — C3430 Malignant neoplasm of lower lobe, unspecified bronchus or lung: Principal | ICD-10-CM | POA: Diagnosis present

## 2011-09-03 DIAGNOSIS — Z01812 Encounter for preprocedural laboratory examination: Secondary | ICD-10-CM

## 2011-09-03 DIAGNOSIS — K219 Gastro-esophageal reflux disease without esophagitis: Secondary | ICD-10-CM | POA: Diagnosis present

## 2011-09-03 DIAGNOSIS — Z9981 Dependence on supplemental oxygen: Secondary | ICD-10-CM

## 2011-09-03 DIAGNOSIS — I10 Essential (primary) hypertension: Secondary | ICD-10-CM | POA: Diagnosis present

## 2011-09-03 DIAGNOSIS — F17200 Nicotine dependence, unspecified, uncomplicated: Secondary | ICD-10-CM | POA: Diagnosis present

## 2011-09-03 DIAGNOSIS — I252 Old myocardial infarction: Secondary | ICD-10-CM

## 2011-09-03 DIAGNOSIS — E1149 Type 2 diabetes mellitus with other diabetic neurological complication: Secondary | ICD-10-CM | POA: Diagnosis present

## 2011-09-03 LAB — GLUCOSE, CAPILLARY: Glucose-Capillary: 101 mg/dL — ABNORMAL HIGH (ref 70–99)

## 2011-09-03 LAB — BLOOD GAS, ARTERIAL
FIO2: 0.21 %
TCO2: 28.8 mmol/L (ref 0–100)
pCO2 arterial: 47.4 mmHg — ABNORMAL HIGH (ref 35.0–45.0)
pH, Arterial: 7.38 (ref 7.350–7.400)

## 2011-09-04 ENCOUNTER — Inpatient Hospital Stay (HOSPITAL_COMMUNITY): Payer: Medicare Other

## 2011-09-04 LAB — CBC
HCT: 28.6 % — ABNORMAL LOW (ref 36.0–46.0)
Hemoglobin: 9.1 g/dL — ABNORMAL LOW (ref 12.0–15.0)
RDW: 14.3 % (ref 11.5–15.5)
WBC: 15.7 10*3/uL — ABNORMAL HIGH (ref 4.0–10.5)

## 2011-09-04 LAB — POCT I-STAT 3, ART BLOOD GAS (G3+)
Bicarbonate: 27 mEq/L — ABNORMAL HIGH (ref 20.0–24.0)
pCO2 arterial: 47.1 mmHg — ABNORMAL HIGH (ref 35.0–45.0)
pH, Arterial: 7.367 (ref 7.350–7.400)
pO2, Arterial: 64 mmHg — ABNORMAL LOW (ref 80.0–100.0)

## 2011-09-04 LAB — BASIC METABOLIC PANEL
BUN: 13 mg/dL (ref 6–23)
Chloride: 104 mEq/L (ref 96–112)
GFR calc Af Amer: 49 mL/min — ABNORMAL LOW (ref >90)
Glucose, Bld: 132 mg/dL — ABNORMAL HIGH (ref 70–99)
Potassium: 4.2 mEq/L (ref 3.5–5.1)

## 2011-09-04 LAB — GLUCOSE, CAPILLARY
Glucose-Capillary: 137 mg/dL — ABNORMAL HIGH (ref 70–99)
Glucose-Capillary: 143 mg/dL — ABNORMAL HIGH (ref 70–99)

## 2011-09-05 ENCOUNTER — Inpatient Hospital Stay (HOSPITAL_COMMUNITY): Payer: Medicare Other

## 2011-09-05 LAB — COMPREHENSIVE METABOLIC PANEL
ALT: 13 U/L (ref 0–35)
AST: 18 U/L (ref 0–37)
Albumin: 2.5 g/dL — ABNORMAL LOW (ref 3.5–5.2)
CO2: 28 mEq/L (ref 19–32)
Calcium: 8.4 mg/dL (ref 8.4–10.5)
GFR calc non Af Amer: 44 mL/min — ABNORMAL LOW (ref >90)
Sodium: 136 mEq/L (ref 135–145)
Total Protein: 5.4 g/dL — ABNORMAL LOW (ref 6.0–8.3)

## 2011-09-05 LAB — CBC
MCH: 29.2 pg (ref 26.0–34.0)
Platelets: 207 10*3/uL (ref 150–400)
RBC: 2.91 MIL/uL — ABNORMAL LOW (ref 3.87–5.11)
RDW: 14.7 % (ref 11.5–15.5)

## 2011-09-05 LAB — GLUCOSE, CAPILLARY
Glucose-Capillary: 146 mg/dL — ABNORMAL HIGH (ref 70–99)
Glucose-Capillary: 118 mg/dL — ABNORMAL HIGH (ref 70–99)
Glucose-Capillary: 128 mg/dL — ABNORMAL HIGH (ref 70–99)

## 2011-09-05 NOTE — Op Note (Signed)
NAME:  Madison Henson, Madison Henson NO.:  [id number]  MEDICAL RECORD NO.:  [id number]  LOCATION:  2306                         FACILITY:  [facility]  PHYSICIAN:  Ines Bloomer, M.D. DATE OF BIRTH:  November 09, 1928  DATE OF PROCEDURE:  09/03/2011 DATE OF DISCHARGE:                              OPERATIVE REPORT   PREOPERATIVE DIAGNOSIS:  Non-small cell lung cancer, squamous cell type, right lower lobe.  POSTOPERATIVE DIAGNOSIS:  Non-small cell lung cancer, squamous cell type, right lower lobe.  OPERATION PERFORMED:  Right lower lobe superior segmentectomy.  SURGEON:  Ines Bloomer, MD  ANESTHESIA:  General anesthesia.  INDICATIONS:  This patient had a borderline pulmonary function test with an FEV1 of 1.08, a biopsy-proven squamous cell cancer, so we elected to go with trying to do a segmentectomy.  DESCRIPTION OF PROCEDURE:  After general anesthesia, she was turned to the right lateral thoracotomy position.  She was prepped and draped in the usual sterile manner.  Dual-lumen tube was inserted.  Two trocar sites were made in the anterior and posterior axillary line at the seventh intercostal space.  Two trocars were inserted.  Exploration was carried out with the camera and no pleural lesions were seen.  We then made a 7-cm incision over the triangle of auscultation, partially dividing the latissimus, reflecting the serratus anteriorly, and putting a small TPA in the sixth interspace.  We found that the lesion was in the superior segment but we decided to do a right lower lobe superior segmentectomy.  We originally could not tell what was in the superior of the basilar segments but it was kind of more in the superior segment and the basilar segments.  We thought of doing an extended superior segmentectomy would be adequate.  Since we started in the inferior pulmonary ligament.  It was taken down with electrocautery dissecting out some 8 and 7 nodes.  We did note a  subcarinal node dissection dissecting out multiple more 7 nodes and all bleeding was coagulated. After we did dissected the posterior mediastinum, we turned our attention to the fissure and identified the pulmonary artery and then dissected superiorly identifying the superior segmental artery and looping it with a vascular tape, and stapled and divided with a Covidien hook stapler.  We then divided the superior portion of the fissure with several applications of the Covidien blue and purple 45 stapler.  We then identified a superior segmental vein and it was doubly clipped and divided.  Attention was then turned to the bronchus.  The bronchus was dissected up but there were multiple nodes and we had to dissect off the bronchus and 11R nodes.  We dissected superiorly up the bronchus intermedius and obtained some 10R nodes.  The bronchus was then stapled with the blue stapler.  Another branch of the superior segmental vein was doubly clipped and divided.  Then we came across the parenchyma with the Covidien black stapler, using the 60 load several times.  The lesion was removed.  There was squamous cell cancer with margins negative.  Two chest tubes were placed through the trocar sites, straight chest tube and a posterior right angle chest tube.  A single On-Q  was inserted in the usual fashion and the Marcaine block in the usual fashion.  The chest was closed with 3 pericostals, #1 Vicryl in the muscle layer, 2-0 Vicryl in the subcutaneous tissue, and Dermabond for the skin.  The patient was returned to the recovery room in stable condition.     Ines Bloomer, M.D.     DPB/MEDQ  D:  09/03/2011  T:  09/03/2011  Job:  [id number]  Electronically Signed by Jovita Gamma M.D. on 09/05/2011 09:11:13 PM

## 2011-09-05 NOTE — H&P (Signed)
NAME:  Madison Henson, Madison Henson NO.:  [id number]  MEDICAL RECORD NO.:  [id number]  LOCATION:                                 FACILITY:  PHYSICIAN:  Ines Bloomer, M.D. DATE OF BIRTH:  May 09, 1928  DATE OF ADMISSION: DATE OF DISCHARGE:                             HISTORY & PHYSICAL   Her aspiration return to have a right lower lobe lesion while she is being worked up for abdominal pain.  She is a long-time smoker and has only recently quit smoking.  Her PET scan showed a standard uptake value of 3.9, it was 1 cm in size in June 2012, and she underwent a biopsy of this and they were negative, but because of we were worried that we went ahead and repeat her CT scan that showed this has increased at least 2 cm in size.  Needle biopsy was done, which showed non-small cell cancer favoring squamous cell cancer.  Her PET scan showed no hilar or mediastinal nodes.  She has had no hemoptysis, fever, chills, or excessive sputum.  She was admitted recently for chest pain, which was negative for workup.  PAST MEDICAL HISTORY:  No past medical history.  MEDICATIONS:  She has been on Protonix, Aranesp, Phenergan, Lortab, DuoDerm, Janumet, Cymbalta, Xanax, albuterol, enalapril, Plavix, Dexilant, Lasix, Neurontin, hydrocodone, potassium, simvastatin.  ALLERGIES:  He is allergic to SULFA and REGLAN.  She has hyperlipidemia, anemia, hypertension, coronary artery disease, chronic obstructive pulmonary disease, GERD, gastroparesis, sciatica, lumbar stenosis, some dysphagia, and ongoing tobacco abuse.  FAMILY HISTORY:  Noncontributory.  SOCIAL HISTORY:  She is single and has 1 child.  Retired from the dry cleaning.  Smokes a pack of cigarettes a day.  Does not drink alcohol on a regular basis.  REVIEW OF SYSTEMS:  She is 215 pounds, her weight is stable.  CARDIAC: She had previous stents and myocardial infarction.  No recent angina. PULMONARY:  She is on home oxygen and has a  cough and wheezing.  GI: She has dysphagia, reflux, abdominal pain.  She has some problems with tertiary esophagus.  GU:  No kidney disease, dysuria, or frequent urination.  VASCULAR:  Pain in her leg with walking.  No TIAs. MUSCULOSKELETAL:  She has arthritis and joint pain.  NEUROLOGIC:  No nervousness.  No dizziness, headaches, blackouts or seizures. PSYCHIATRIC:  She had depression and nervousness.  EYE/ENT:  No change in eyesight or hearing.  HEMATOLOGICAL:  She has anemia.  No problems with bleeding or clotting disorders.  PHYSICAL EXAMINATION:  GENERAL:  She is an obese Caucasian female, in no acute distress. VITAL SIGNS:  Blood pressure is 110/60, pulse 80, respirations 20, sats are 97%. HEAD:  Atraumatic. EYES:  Pupils equal, reactive to light and accommodation. EARS:  Tympanic membranes are intact. NOSE:  No septal deviation. THROAT:  Unremarkable.  Uvula is in the midline. NECK:  Supple without thyromegaly.  There is no supraclavicular or axillary adenopathy. CHEST:  Clear to auscultation and percussion. HEART:  Regular sinus rhythm.  No murmurs. ABDOMEN:  Soft.  No hepatosplenomegaly. EXTREMITY:  Pulses are 2+.  No clubbing or edema. NEUROLOGICAL:  She is oriented x3.  Sensory and  motor intact. SKIN:  Dry.  IMPRESSION: 1. Enlarging right lower lobe lesion, squamous cell cancer. 2. Chronic obstructive pulmonary disease. 3. Ongoing tobacco abuse. 4. Hyperlipidemia. 5. Anemia. 6. Hypertension. 7. Coronary artery disease. 8. Spinal stenosis. 9. Gastroparesis. 10.Dysphasia.  PLAN:  Right VATS, right lower lobe basilar segmentectomy if possible.  Her pulmonary function test showed a diffusioning capacity of 48%.  FVC is 1.80 or 49% predicted with an FEV1 of 1.34 or 51% predicted.     Ines Bloomer, M.D.     DPB/MEDQ  D:  08/30/2011  T:  08/31/2011  Job:  [id number]  Electronically Signed by Jovita Gamma M.D. on 09/05/2011 09:11:11 PM

## 2011-09-06 ENCOUNTER — Inpatient Hospital Stay (HOSPITAL_COMMUNITY): Payer: Medicare Other

## 2011-09-06 DIAGNOSIS — C3430 Malignant neoplasm of lower lobe, unspecified bronchus or lung: Secondary | ICD-10-CM

## 2011-09-06 DIAGNOSIS — J449 Chronic obstructive pulmonary disease, unspecified: Secondary | ICD-10-CM

## 2011-09-06 LAB — GLUCOSE, CAPILLARY
Glucose-Capillary: 115 mg/dL — ABNORMAL HIGH (ref 70–99)
Glucose-Capillary: 112 mg/dL — ABNORMAL HIGH (ref 70–99)
Glucose-Capillary: 124 mg/dL — ABNORMAL HIGH (ref 70–99)

## 2011-09-06 LAB — BASIC METABOLIC PANEL
Chloride: 104 mEq/L (ref 96–112)
GFR calc Af Amer: 52 mL/min — ABNORMAL LOW (ref >90)
GFR calc non Af Amer: 45 mL/min — ABNORMAL LOW (ref >90)
Potassium: 4 mEq/L (ref 3.5–5.1)
Sodium: 137 mEq/L (ref 135–145)

## 2011-09-06 LAB — CBC
MCHC: 31.2 g/dL (ref 30.0–36.0)
Platelets: 215 10*3/uL (ref 150–400)
RDW: 14.7 % (ref 11.5–15.5)
WBC: 13.9 10*3/uL — ABNORMAL HIGH (ref 4.0–10.5)

## 2011-09-07 ENCOUNTER — Inpatient Hospital Stay (HOSPITAL_COMMUNITY): Payer: Medicare Other

## 2011-09-07 LAB — BASIC METABOLIC PANEL
BUN: 13 mg/dL (ref 6–23)
Chloride: 103 mEq/L (ref 96–112)
Creatinine, Ser: 1.07 mg/dL (ref 0.50–1.10)
GFR calc Af Amer: 55 mL/min — ABNORMAL LOW (ref >90)
Glucose, Bld: 114 mg/dL — ABNORMAL HIGH (ref 70–99)
Potassium: 3.7 mEq/L (ref 3.5–5.1)

## 2011-09-07 LAB — CBC
HCT: 25.6 % — ABNORMAL LOW (ref 36.0–46.0)
Hemoglobin: 8.1 g/dL — ABNORMAL LOW (ref 12.0–15.0)
MCHC: 31.6 g/dL (ref 30.0–36.0)
MCV: 93.1 fL (ref 78.0–100.0)
RDW: 14.8 % (ref 11.5–15.5)

## 2011-09-07 LAB — GLUCOSE, CAPILLARY: Glucose-Capillary: 111 mg/dL — ABNORMAL HIGH (ref 70–99)

## 2011-09-08 ENCOUNTER — Inpatient Hospital Stay (HOSPITAL_COMMUNITY): Payer: Medicare Other

## 2011-09-08 ENCOUNTER — Other Ambulatory Visit: Payer: Self-pay | Admitting: Thoracic Surgery

## 2011-09-08 DIAGNOSIS — C3430 Malignant neoplasm of lower lobe, unspecified bronchus or lung: Secondary | ICD-10-CM

## 2011-09-08 LAB — GLUCOSE, CAPILLARY
Glucose-Capillary: 131 mg/dL — ABNORMAL HIGH (ref 70–99)
Glucose-Capillary: 101 mg/dL — ABNORMAL HIGH (ref 70–99)
Glucose-Capillary: 95 mg/dL (ref 70–99)

## 2011-09-09 LAB — TYPE AND SCREEN: Unit division: 0

## 2011-09-15 ENCOUNTER — Ambulatory Visit
Admission: RE | Admit: 2011-09-15 | Discharge: 2011-09-15 | Disposition: A | Discharge status: Home / Self Care | Payer: Medicare Other | Source: Ambulatory Visit | Attending: Thoracic Surgery | Admitting: Thoracic Surgery

## 2011-09-15 ENCOUNTER — Ambulatory Visit (INDEPENDENT_AMBULATORY_CARE_PROVIDER_SITE_OTHER): Payer: Self-pay | Admitting: Thoracic Surgery

## 2011-09-15 ENCOUNTER — Encounter: Payer: Self-pay | Admitting: Thoracic Surgery

## 2011-09-15 VITALS — BP 130/82 | HR 82 | Resp 20 | Ht 69.0 in | Wt 210.0 lb

## 2011-09-15 DIAGNOSIS — C3430 Malignant neoplasm of lower lobe, unspecified bronchus or lung: Secondary | ICD-10-CM

## 2011-09-15 DIAGNOSIS — Z09 Encounter for follow-up examination after completed treatment for conditions other than malignant neoplasm: Secondary | ICD-10-CM

## 2011-09-15 DIAGNOSIS — C3490 Malignant neoplasm of unspecified part of unspecified bronchus or lung: Secondary | ICD-10-CM

## 2011-09-15 NOTE — Discharge Summary (Signed)
NAMEMarland Henson  [name], [name] NO.:  [id number]  MEDICAL RECORD NO.:  [id number]  LOCATION:  3306                         FACILITY:  [facility]  PHYSICIAN:  Ines Bloomer, M.D. DATE OF BIRTH:  09/05/1928  DATE OF ADMISSION:  09/03/2011 DATE OF DISCHARGE:  09/08/2011                              DISCHARGE SUMMARY   ADMITTING DIAGNOSES: 1. Non-small cell lung cancer, right lower lobe. 2. History of tobacco abuse. 3. History of hyperlipidemia. 4. History of anemia. 5. History of hypertension. 6. History of coronary artery disease. 7. History of chronic obstructive pulmonary disease. 8. History of gastroesophageal reflux disease. 9. History of gastroparesis. 10.History of sciatica. 11.History of lumbar stenosis.  DISCHARGE DIAGNOSES: 1. Non-small cell lung cancer, right lower lobe. 2. History of tobacco abuse. 3. History of hyperlipidemia. 4. History of anemia. 5. History of hypertension. 6. History of coronary artery disease. 7. History of chronic obstructive pulmonary disease. 8. History of gastroesophageal reflux disease. 9. History of gastroparesis. 10.History of sciatica. 11.History of lumbar stenosis.  PROCEDURE:  Right VATS, right mini thoracotomy, right lower lobe superior segmentectomy with lymph node sampling and On-Q placement by Dr. Edwyna Shell on September 03, 2011.  PATHOLOGY RESULTS:  Right lower lobe superior segmentectomy shows well to moderately differentiated invasive squamous cell carcinoma (resection margin visceral pleural negative for tumor, lymph nodes also negative for tumor).  The patient was staged as a  pT1b, pN0.  HISTORY OF PRESENTING ILLNESS:  This is an 77 year old Caucasian female with a past medical history of COPD and tobacco abuse., history of CAD (status post stents to the mid left circ and mid RCA), hypertension and hyperlipidemia, who was found to have an enlarging right lower lobe pulmonary nodule during a workup for abdominal  pain.  The patient was initially seen in the office regarding the right lower lobe nodule by Dr. Edwyna Shell this past summer.  A PET scan that was done on April 16, 2011, showed the right lower lobe nodule to be approximately 12 mm and had an SUV max of 3.9.  There was no hypermetabolic, hilar or mediastinal lymph node.  The patient was followed closely with a CAT scan of the chest. Her last CAT scan of the chest done on August 09, 2011, showed enlargement of the right lower lobe pulmonary nodule (now measures 3 x 2.2 cm). She did undergo a right lower lobe lung biopsy on May 10, 2011, and pathology showed no evidence of malignancy.  She then underwent a video bronch with electromagnetic navigation by Dr. Edwyna Shell on May 11, 2011.  Cytology results showed no malignant cells identified.  She then underwent a fine-needle aspiration of the right lower lobe on August 11, 2011.  Pathology results indicated malignant cells present consistent with non-small cell carcinoma (favor squamous cell carcinoma).  It should be noted that the patient did develop chest pain and presented to [facility] for further evaluation and treatment on August 09, 2011.  Her last cardiac catheterization was 1 month prior which showed approximately 10% stenosis of the left main, 40% ostial LAD stenosis, patent stents to the left circ and mid RCA and a 40% discrete left circ stenosis with preserved EF  of 50%.  Cardiac enzymes were obtained and she was found to have slight elevation of her troponin to 0.37 and her D- dimer was elevated at 0.72.  However, a CAT scan of the chest that was done did rule out pulmonary embolus.  It was felt that the patient's chest pain was noncardiac and likely musculoskeletal in origin.  It did resolve prior her discharge on August 12, 2011.  A thorough discussion was then had regarding the treatment for the enlarging pulmonary lung nodule that was found to be positive for non-small  cell carcinoma by fine-needle aspiration.  A thorough discussion was had with the patient regarding necessitation for removal of the right lower lobe nodule.  Potential risks, complications, and benefits of the surgery were discussed with the patient and she agreed to proceed.  She was admitted to [facility] on September 03, 2011, in order to undergo a right VATS, right mini thoracotomy, right lower lobe superior segmentectomy and lymph node dissection by Dr. Edwyna Shell.  BRIEF HOSPITAL COURSE STAY:  The patient remained afebrile and hemodynamically stable.  Her chest tube was not found to have an air leak.  Suction was decreased and her art line was removed later on the day of surgery.  Daily chest x-rays were obtained and remained stable. She was not found to have an air leak.  Her suction was removed on postoperative day #1.  Anterior chest tube and On-Q were removed on postop day #2.  Her remaining chest tube was placed to a Mini Express. She was transferred from the Intensive Care unit to 3300 for further convalescence on September 06, 2011.  Her Foley was also removed on this date.  The patient was found to have anemia postoperatively and as previously stated, she did have a history of anemia.  Her H and H went as low as 8.1 and 25.6.  She was not given a transfusion.  She was started on Nu-Iron which will be continued upon discharge.  As previously stated, she has a history of diabetes mellitus and is on Tradjenta.  This will be restarted today as she has already been tolerating a diabetic diet.  She will be given insulin p.r.n. Currently, she had T-max of a 100.1 but later became afebrile, heart rate in the 80s to 90s, BP 126/51, O2 saturation 95% on 2 L nasal cannula.  Chest tube at approximately 40 mL on October 22, scant output since then.  No air leak.  Chest x-ray done today is stable, questionable trace right apical pneumothorax.  PHYSICAL EXAMINATION:  CARDIOVASCULAR:  Regular  rhythm. PULMONARY:  Positive expiatory wheezing (patient given a nebulization treatment for this). ABDOMEN:  Soft, nontender, occasional bowel sounds. EXTREMITIES:  Trace lower extremity edema.  Her wound is clean and dry.  The patient has not had a bowel movement yet and will be given a laxative choice for constipation.  Patient's chest tube will be removed today.  Followup chest x-ray will be obtained in the morning.  Provided the patient remains afebrile, hemodynamically stable and chest x-ray remains stable, she will be discharged home with home O2 on September 08, 2011.  LATEST LABORATORY STUDIES:  Are as follows.  BMET done on October 23, potassium 3.7 which has been supplemented, sodium 130, BUN and creatinine 13 and 1.07 respectively.  CBC on this date H and H are 8.1 and 25.6, white count 9700, platelet count 205,000.  Last chest x-ray done as previously stated.  BRIEF DISCHARGE INSTRUCTIONS:  Include the  following, Diet:  Low sodium, heart healthy, diabetic diet.  Activity:  The patient is to increase activity slowly, she may walk up steps.  She may shower.  She is not to lift more than 10 pounds for 2 weeks and to drive until after 2 weeks.  She is to continue with her breathing exercise daily.  She is to walk daily and increase frequency and duration as tolerates.  Wound care:  She is to soap water on her wounds.  She is to contact the office when wound problems arise.  FOLLOWUP APPOINTMENT:  Includes to see Dr. Edwyna Shell on September 15, 2011 at 3:00 p.m.  A PA and lateral chest x-ray should be obtained at 2:15 prior to this office appointment.  DISCHARGE MEDICATIONS:  Include the following 1. Nu-Iron 150 mg p.o. daily. 2. NicoDerm CQ patch 21 mg per 24 hours transdermally daily. 3. Percocet 5/325 one tablet p.o. q.6 hours p.r.n. pain. 4. Alprazolam o.5 mg p.o. 2 times daily. 5. Amlodipine 5 mg p.o. daily. 6. Atorvastatin 10 mg p.o. daily. 7. Dexilant 60 mg p.o. daily  p.r.n. acid reflux. 8. Gabapentin 100 mg p.o. 3 times daily. 9. Hyoscyamine 0.125 mg p.o. q.4 hours p.r.n. 10.Metoclopramide 5 mg p.o. 3 times daily. 11.Plavix 75 mg p.o. daily. 12.ProAir inhaler 2 puffs inhaled every 4 hours p.r.n. shortness of     breath. 13.Synthroid 112 mcg p.o. daily. 14.Tradjenta 5 mg p.o. daily. 15.Vitamin B12 one injection subcutaneously monthly.     Doree Fudge, PA   ______________________________ Ines Bloomer, M.D.    DZ/MEDQ  D:  09/07/2011  T:  09/07/2011  Job:  [id number]  cc:   Verne Carrow, MD [name], M.D.  Electronically Signed by Doree Fudge PA on 09/13/2011 03:52:28 PM Electronically Signed by Jovita Gamma M.D. on 09/15/2011 05:00:25 PM

## 2011-09-15 NOTE — Progress Notes (Signed)
HPI patient returns today after a right lower lobectomy for stage IA non-small cell lung cancer. She has gone back to smoking. I cautioned her to stop smoking. Her incision was well healed. We removed her chest tube sutures. Chest x-ray showed normal postoperative changes for a right lower lobectomy. We gave her a refill for Percocet #55/325. We will see her back again in 2 weeks with a chest x-ray. I'll need to refer her to a medical oncologist   Current Outpatient Prescriptions  Medication Sig Dispense Refill  . albuterol (PROAIR HFA) 108 (90 BASE) MCG/ACT inhaler Inhale 2 puffs into the lungs every 6 (six) hours as needed.        . ALPRAZolam (XANAX) 0.5 MG tablet Take 0.5 mg by mouth 2 (two) times daily.        Marland Kitchen amLODipine (NORVASC) 5 MG tablet Take 1 tablet (5 mg total) by mouth daily.  30 tablet  6  . atorvastatin (LIPITOR) 20 MG tablet Take 20 mg by mouth daily.        . clopidogrel (PLAVIX) 75 MG tablet Take 1 tablet (75 mg total) by mouth daily.  30 tablet  8  . gabapentin (NEURONTIN) 100 MG capsule Take 300 mg by mouth 3 (three) times daily.       . hyoscyamine (LEVSIN SL) 0.125 MG SL tablet Place 0.125 mg under the tongue every 4 (four) hours as needed.        Marland Kitchen levothyroxine (SYNTHROID, LEVOTHROID) 112 MCG tablet Take 100 mcg by mouth daily.       Marland Kitchen linagliptin (TRADJENTA) 5 MG TABS tablet Take 5 mg by mouth daily.        . metoCLOPramide (REGLAN) 5 MG tablet 1 PO QAC THREE TIMES A DAY  90 tablet  5  . oxyCODONE-acetaminophen (PERCOCET) 5-325 MG per tablet Take 1 tablet by mouth every 4 (four) hours as needed.        . pantoprazole (PROTONIX) 40 MG tablet Take 40 mg by mouth daily.        . Vitamin D, Ergocalciferol, (DRISDOL) 50000 UNITS CAPS Take 50,000 Units by mouth once a week.           Review of Systems: No change   Physical Exam incision is well healed lungs are clear to auscultation percussion heart regular sinus rhythm and no murmur Diagnostic Tests: Chest x-ray shows  postoperative changes right lower lobectomy   Impression: Stage IA non-small cell lung cancer ongoing tobacco abuse   Plan: Return in 2 weeks with chest Xray

## 2011-09-27 ENCOUNTER — Other Ambulatory Visit: Payer: Self-pay | Admitting: Thoracic Surgery

## 2011-09-27 DIAGNOSIS — C3430 Malignant neoplasm of lower lobe, unspecified bronchus or lung: Secondary | ICD-10-CM

## 2011-09-29 ENCOUNTER — Ambulatory Visit: Payer: Self-pay | Admitting: Thoracic Surgery

## 2011-09-29 ENCOUNTER — Other Ambulatory Visit: Payer: Self-pay | Admitting: Thoracic Surgery

## 2011-09-29 DIAGNOSIS — C3430 Malignant neoplasm of lower lobe, unspecified bronchus or lung: Secondary | ICD-10-CM

## 2011-10-05 ENCOUNTER — Encounter: Payer: Self-pay | Admitting: Thoracic Surgery

## 2011-10-05 ENCOUNTER — Ambulatory Visit (INDEPENDENT_AMBULATORY_CARE_PROVIDER_SITE_OTHER): Payer: Self-pay | Admitting: Thoracic Surgery

## 2011-10-05 ENCOUNTER — Ambulatory Visit
Admission: RE | Admit: 2011-10-05 | Discharge: 2011-10-05 | Disposition: A | Discharge status: Home / Self Care | Payer: Medicare Other | Source: Ambulatory Visit | Attending: Thoracic Surgery | Admitting: Thoracic Surgery

## 2011-10-05 VITALS — BP 129/73 | HR 93 | Resp 18 | Ht 69.0 in | Wt 215.0 lb

## 2011-10-05 DIAGNOSIS — C3430 Malignant neoplasm of lower lobe, unspecified bronchus or lung: Secondary | ICD-10-CM

## 2011-10-05 NOTE — Progress Notes (Signed)
HPI issue returns for followup today. Her chest x-ray shows normal postoperative changes. Complains of some pain in the posterior chest tube site. She is doing well otherwise. We gave her a refill for pain medication hydrocodone 7.5/500 #60 with one refill. This I will see her back again in 6 weeks with a chest x-ray. She will see oncology in February.   Current Outpatient Prescriptions  Medication Sig Dispense Refill  . albuterol (PROAIR HFA) 108 (90 BASE) MCG/ACT inhaler Inhale 2 puffs into the lungs every 6 (six) hours as needed.        . ALPRAZolam (XANAX) 0.5 MG tablet Take 0.5 mg by mouth 2 (two) times daily.        Marland Kitchen amLODipine (NORVASC) 5 MG tablet Take 1 tablet (5 mg total) by mouth daily.  30 tablet  6  . atorvastatin (LIPITOR) 20 MG tablet Take 20 mg by mouth daily.        . clopidogrel (PLAVIX) 75 MG tablet Take 1 tablet (75 mg total) by mouth daily.  30 tablet  8  . gabapentin (NEURONTIN) 100 MG capsule Take 300 mg by mouth 3 (three) times daily.       . hyoscyamine (LEVSIN SL) 0.125 MG SL tablet Place 0.125 mg under the tongue every 4 (four) hours as needed.        Marland Kitchen levothyroxine (SYNTHROID, LEVOTHROID) 112 MCG tablet Take 100 mcg by mouth daily.       Marland Kitchen linagliptin (TRADJENTA) 5 MG TABS tablet Take 5 mg by mouth daily.        . metoCLOPramide (REGLAN) 5 MG tablet 1 PO QAC THREE TIMES A DAY  90 tablet  5  . oxyCODONE-acetaminophen (PERCOCET) 5-325 MG per tablet Take 1 tablet by mouth every 4 (four) hours as needed.        . pantoprazole (PROTONIX) 40 MG tablet Take 40 mg by mouth daily.        . Vitamin D, Ergocalciferol, (DRISDOL) 50000 UNITS CAPS Take 50,000 Units by mouth once a week.           Review of Systems: Unchanged   Physical Exam incisions are well-healed posterior chest tube site had has some tenderness her lungs are clear to auscultation and percussion   Diagnostic Tests: Chest x-ray shows normal postoperative changes on the right   Impression: Status post  right lower lobectomy for stage Ib non-small cell lung cancer   Plan: Return in 6 weeks with chest x-ray

## 2011-10-29 ENCOUNTER — Other Ambulatory Visit (HOSPITAL_COMMUNITY): Payer: Self-pay | Admitting: Internal Medicine

## 2011-10-29 DIAGNOSIS — N644 Mastodynia: Secondary | ICD-10-CM

## 2011-11-17 ENCOUNTER — Ambulatory Visit: Payer: Medicare Other | Admitting: Thoracic Surgery

## 2011-11-24 ENCOUNTER — Ambulatory Visit
Admission: RE | Admit: 2011-11-24 | Discharge: 2011-11-24 | Disposition: A | Discharge status: Home / Self Care | Payer: Medicare Other | Source: Ambulatory Visit | Attending: Thoracic Surgery | Admitting: Thoracic Surgery

## 2011-11-24 ENCOUNTER — Ambulatory Visit (INDEPENDENT_AMBULATORY_CARE_PROVIDER_SITE_OTHER): Payer: Self-pay | Admitting: Thoracic Surgery

## 2011-11-24 ENCOUNTER — Encounter: Payer: Self-pay | Admitting: Thoracic Surgery

## 2011-11-24 ENCOUNTER — Other Ambulatory Visit: Payer: Self-pay | Admitting: Thoracic Surgery

## 2011-11-24 VITALS — BP 121/65 | HR 79 | Resp 18 | Ht 69.0 in | Wt 200.0 lb

## 2011-11-24 DIAGNOSIS — C3430 Malignant neoplasm of lower lobe, unspecified bronchus or lung: Secondary | ICD-10-CM

## 2011-11-24 DIAGNOSIS — Z09 Encounter for follow-up examination after completed treatment for conditions other than malignant neoplasm: Secondary | ICD-10-CM

## 2011-11-24 NOTE — Progress Notes (Signed)
HPI patient returns for followup. She is doing well with minimal pain. Chest x-ray shows normal postoperative changes with a common opacity in the right hilum which is postoperatively postop in nature. She'll see Dr.darovsky in one month. We will see her again as needed.  Current Outpatient Prescriptions  Medication Sig Dispense Refill  . albuterol (PROAIR HFA) 108 (90 BASE) MCG/ACT inhaler Inhale 2 puffs into the lungs every 6 (six) hours as needed.        . ALPRAZolam (XANAX) 0.5 MG tablet Take 0.5 mg by mouth 2 (two) times daily.        Marland Kitchen amLODipine (NORVASC) 5 MG tablet Take 1 tablet (5 mg total) by mouth daily.  30 tablet  6  . atorvastatin (LIPITOR) 20 MG tablet Take 20 mg by mouth daily.        . clopidogrel (PLAVIX) 75 MG tablet Take 1 tablet (75 mg total) by mouth daily.  30 tablet  8  . gabapentin (NEURONTIN) 100 MG capsule Take 300 mg by mouth 3 (three) times daily.       . hyoscyamine (LEVSIN SL) 0.125 MG SL tablet Place 0.125 mg under the tongue every 4 (four) hours as needed.        Marland Kitchen levothyroxine (SYNTHROID, LEVOTHROID) 112 MCG tablet Take 100 mcg by mouth daily.       Marland Kitchen linagliptin (TRADJENTA) 5 MG TABS tablet Take 5 mg by mouth daily.        . metoCLOPramide (REGLAN) 5 MG tablet 1 PO QAC THREE TIMES A DAY  90 tablet  5  . pantoprazole (PROTONIX) 40 MG tablet Take 40 mg by mouth daily.        . Vitamin D, Ergocalciferol, (DRISDOL) 50000 UNITS CAPS Take 50,000 Units by mouth once a week.        Marland Kitchen oxyCODONE-acetaminophen (PERCOCET) 5-325 MG per tablet Take 1 tablet by mouth every 4 (four) hours as needed.           Review of Systems: Unchanged   Physical Exam lungs are clear to auscultation percussion   Diagnostic Tests: Chest x-ray shows normal postoperative changes   Impression: Status post right lower lobectomy for stage Ib squamous cell cancer   Plan: Return as needed

## 2011-11-30 ENCOUNTER — Other Ambulatory Visit: Payer: Self-pay | Admitting: Cardiovascular Disease

## 2011-12-01 ENCOUNTER — Encounter (HOSPITAL_COMMUNITY): Payer: Medicare Other

## 2011-12-10 ENCOUNTER — Other Ambulatory Visit: Payer: Self-pay | Admitting: Cardiovascular Disease

## 2012-02-04 ENCOUNTER — Other Ambulatory Visit: Payer: Self-pay | Admitting: Gastroenterology

## 2012-02-04 NOTE — Telephone Encounter (Signed)
Per OV note 06/2011, patient did not tolerate reglan previously. She needs to call office if medication needed or symptomatic.

## 2012-02-07 ENCOUNTER — Telehealth: Payer: Self-pay

## 2012-02-07 NOTE — Telephone Encounter (Signed)
Pharmacy had pt call in reference to her Reglan. Pt did say that it makes her very sick. She would wait 30 min after taking to eat and still get very nauseated and vomit. I scheduled for an OV with Gerrit Halls, NP on 02/10/2012 at 3:30 PM.

## 2012-02-07 NOTE — Telephone Encounter (Signed)
Informed Rhonda at the [facility].

## 2012-02-10 ENCOUNTER — Ambulatory Visit: Payer: Medicare Other | Admitting: Gastroenterology

## 2012-02-15 ENCOUNTER — Ambulatory Visit: Payer: Medicare Other | Admitting: Urgent Care

## 2012-02-22 ENCOUNTER — Encounter: Payer: Self-pay | Admitting: Gastroenterology

## 2012-03-10 ENCOUNTER — Other Ambulatory Visit: Payer: Self-pay | Admitting: *Deleted

## 2012-03-10 DIAGNOSIS — I6529 Occlusion and stenosis of unspecified carotid artery: Secondary | ICD-10-CM

## 2012-03-14 ENCOUNTER — Encounter (INDEPENDENT_AMBULATORY_CARE_PROVIDER_SITE_OTHER): Payer: Medicare Other

## 2012-03-14 ENCOUNTER — Telehealth: Payer: Self-pay | Admitting: Cardiovascular Disease

## 2012-03-14 DIAGNOSIS — I6529 Occlusion and stenosis of unspecified carotid artery: Secondary | ICD-10-CM

## 2012-03-14 NOTE — Telephone Encounter (Signed)
Left message to call back.  Pt also due for 6 month follow up with Dr. Clifton James

## 2012-03-14 NOTE — Telephone Encounter (Signed)
New Problem:     Patient called in wanting to know the results of her latest ultra sound. Please call back.

## 2012-03-17 ENCOUNTER — Telehealth: Payer: Self-pay | Admitting: Cardiovascular Disease

## 2012-03-17 NOTE — Telephone Encounter (Signed)
Pt requesting results of  cartoid done 4-30

## 2012-03-17 NOTE — Telephone Encounter (Signed)
Advised patient report not reviewed yet but will call when reveiwed.

## 2012-03-20 NOTE — Telephone Encounter (Signed)
Spoke with pt and reviewed carotid doppler results with her. 6 month follow up appt made for Apr 14, 2012 at 10:15.

## 2012-04-03 ENCOUNTER — Other Ambulatory Visit: Payer: Self-pay | Admitting: Cardiovascular Disease

## 2012-04-14 ENCOUNTER — Encounter: Payer: Self-pay | Admitting: Cardiovascular Disease

## 2012-04-14 ENCOUNTER — Ambulatory Visit (INDEPENDENT_AMBULATORY_CARE_PROVIDER_SITE_OTHER): Payer: Medicare Other | Admitting: Cardiovascular Disease

## 2012-04-14 VITALS — BP 130/62 | HR 56 | Ht 69.0 in | Wt 195.0 lb

## 2012-04-14 DIAGNOSIS — I2510 Atherosclerotic heart disease of native coronary artery without angina pectoris: Secondary | ICD-10-CM

## 2012-04-14 DIAGNOSIS — F17200 Nicotine dependence, unspecified, uncomplicated: Secondary | ICD-10-CM

## 2012-04-14 DIAGNOSIS — I714 Abdominal aortic aneurysm, without rupture, unspecified: Secondary | ICD-10-CM

## 2012-04-14 DIAGNOSIS — Z720 Tobacco use: Secondary | ICD-10-CM

## 2012-04-14 MED ORDER — VARENICLINE TARTRATE 0.5 MG X 11 & 1 MG X 42 PO MISC: ORAL | Status: AC

## 2012-04-14 MED ORDER — VARENICLINE TARTRATE 1 MG PO TABS: 1.0000 mg | ORAL_TABLET | Freq: Two times a day (BID) | ORAL | Status: AC

## 2012-04-14 NOTE — Assessment & Plan Note (Signed)
3.7 x 3.5 cm infrarenal AAA by MRA in 2011. Will repeat abdominal aortic u/s.

## 2012-04-14 NOTE — Assessment & Plan Note (Signed)
Smoking cessation encouraged. She wishes to stop. Will start Chantix.

## 2012-04-14 NOTE — Assessment & Plan Note (Signed)
Stable. EKG is unchanged. No exertional chest pains. Her sharp chest pains do not sound cardiac. Lipids controlled per pt in primary care. BP is well controlled. Continue Plavix and statin.

## 2012-04-14 NOTE — Patient Instructions (Addendum)
Your physician wants you to follow-up in: 6 months. You will receive a reminder letter in the mail two months in advance. If you don't receive a letter, please call our office to schedule the follow-up appointment.  Your physician has requested that you have an abdominal aorta duplex. During this test, an ultrasound is used to evaluate the aorta. Allow 30 minutes for this exam. Do not eat after midnight the day before and avoid carbonated beverages  Your physician has recommended you make the following change in your medication: Start Chantix. Use as directed

## 2012-04-14 NOTE — Progress Notes (Signed)
History of Present Illness: 78 yo WF with history of CAD, HTN, Hyperlipidemia, DM, GERD, anxiety/depression here today for cardiac follow up. She has been followed in the past by Dr. Juanda Chance. Primary care is Dr. Dwana Melena. 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 A new DES was also placed at that time. She previously had a Cypher stent in the circumflex artery as well.  An event monitor showed only isolated PVCs and APCs. She has chronic shortness of breath related to her COPD. She had an MRI in June 2011 which showed a 3.7 x 3.5 cm infrarenal aortic aneurysm. She has chronic back pain felt to be secondary lumbar disc bulging but not felt to be a surgical candidate. She also has esophageal strictures and had an esophageal dilatation in 2012. She has been on Simvastatin for years. Her lipids are followed by Dr. Margo Aye.She had chest pian in 2012 and  I arranged a left heart cath on 07/09/11. This showed mild to moderate stable disease. She had a right lower lobectomy for stage IA non-small cell lung cancer per Dr. Edwyna Shell in October 2012. She did well with that procedure.   She is here today for follow up. I last saw her in September 2012. She has been on Plavix per Dr. Juanda Chance for lifetime. Her ASA was stopped in the hospital in October. She has continued to smoke, 1/2 ppd.  She tells me today that she has had several episodes of epigastric pain radiating into her back. The pain only lasts for a few seconds up to a minute. No exertional chest pain or change in her breathing.  She wishes to stop smoking.   Primary Care Physician: Dr. Margo Aye  Last Lipid Profile: Per primary care.   Past Medical History  Diagnosis Date  . Coronary artery disease     post diaphragmatic wall infarction on 06-2007,due to stent thrombosis   . Chronic obstructive pulmonary disease   . Tobacco abuse   . Hypertension   . Hyperlipidemia   . Diabetes mellitus    with gastroparesis 70% retention at 2 hours  . GERD (gastroesophageal reflux disease)   . Anxiety disorder   . Depression   . Hypothyroidism   . Hx of hysterectomy   . AAA (abdominal aortic aneurysm)   . Pulmonary nodule 04/2011    neg bx, followed by Dr. Edwyna Shell, may still be cancer, being followed with CTs.  . Esophageal dysmotility     Two BPE since 03/2011-->Moderate impairment of esophageal motility.Vallecular residuals. Laryngeal penetration  . Anemia     Past Surgical History  Procedure Date  . Cholecystectomy   . Cataract surg   . Bladder surgery     tack  . Kidney stone surgery   . Coronary stent placement 2004    2  . Coronary stent placement 2008    2  . S/p hysterectomy   . Neck surgery   . Esophagogastroduodenoscopy 5/08    normal esophagus, No H.Pylori  . Egd/bravo 07/2008    On Nexium BID. Day 1 DMSTR Score: 0.7, day 2: DMSTR score: 32, uncontrolled GERD, No H. Pylori  . Hbt 2009    negative  . Colonoscopy 05/2010    tortuous sigm colon, sigm tics, multiple simple adenomas, hemorrhoids, next TCS 10-15 yrs per Dr. Darrick Penna. Previous h/o tubular adenomas in 2008.   Marland Kitchen Esophagogastroduodenoscopy 05/2010    small hh, streaky erythema in body/antrum. Duodenal bx  negative.   . Esophagogastroduodenoscopy 06/22/2011    Procedure: ESOPHAGOGASTRODUODENOSCOPY (EGD);  Surgeon: Arlyce Harman, MD;  Location: [facility];  Service: Endoscopy;  Laterality: N/A;  with possible dilation  . Savory dilation 06/22/2011    Procedure: SAVORY DILATION;  Surgeon: Arlyce Harman, MD;  Location: [facility];  Service: Endoscopy;  Laterality: N/A;  [name] dilation 06/22/2011    Procedure: [name] DILATION;  Surgeon: Arlyce Harman, MD;  Location: [facility];  Service: Endoscopy;  Laterality: N/A;  . Video brochoscopy with electromagnetic navigaton 05/10/2011    [name]    Current Outpatient Prescriptions  Medication Sig Dispense Refill  . albuterol (PROAIR HFA) 108 (90 BASE)  MCG/ACT inhaler Inhale 2 puffs into the lungs every 6 (six) hours as needed.        . ALPRAZolam (XANAX) 0.5 MG tablet Take 0.5 mg by mouth 2 (two) times daily.        Marland Kitchen atorvastatin (LIPITOR) 20 MG tablet Take 20 mg by mouth daily.        Marland Kitchen gabapentin (NEURONTIN) 100 MG capsule Take 300 mg by mouth 3 (three) times daily.       . hyoscyamine (LEVSIN SL) 0.125 MG SL tablet Place 0.125 mg under the tongue every 4 (four) hours as needed.        Marland Kitchen levothyroxine (SYNTHROID, LEVOTHROID) 112 MCG tablet Take 100 mcg by mouth daily.       Marland Kitchen linagliptin (TRADJENTA) 5 MG TABS tablet Take 5 mg by mouth daily.        . metoCLOPramide (REGLAN) 5 MG tablet 1 PO QAC THREE TIMES A DAY  90 tablet  5  . NORVASC 5 MG tablet TAKE 1 TABLET DAILY  30 each  6  . oxyCODONE-acetaminophen (PERCOCET) 5-325 MG per tablet Take 1 tablet by mouth every 4 (four) hours as needed.        . pantoprazole (PROTONIX) 40 MG tablet Take 40 mg by mouth daily.        Marland Kitchen PLAVIX 75 MG tablet TAKE 1 TABLET DAILY  30 each  8  . Vitamin D, Ergocalciferol, (DRISDOL) 50000 UNITS CAPS Take 50,000 Units by mouth once a week.          Allergies  Allergen Reactions  . Sulfonamide Derivatives     History   Social History  . Marital Status: Widowed    Spouse Name: N/A    Number of Children: N/A  . Years of Education: N/A   Occupational History  . Not on file.   Social History Main Topics  . Smoking status: Current Everyday Smoker  . Smokeless tobacco: Not on file   Comment: She smokes 3 cigarretes daily  . Alcohol Use: No  . Drug Use: No  . Sexually Active: Not on file   Other Topics Concern  . Not on file   Social History Narrative  . No narrative on file    Family History  Problem Relation Age of Onset  . Coronary artery disease      family hx of  . Colon cancer Sister 73  . Colon cancer Daughter   . Prostate cancer      family hx of  . Lung cancer      family hx of  . Ovarian cancer      family hx of  .  Arthritis      family hx of  . Other      family hx of  chronic respiratory condition    Review of Systems:  As stated in the HPI and otherwise negative.   BP 130/62  Pulse 56  Ht 5\' 9"  (1.753 m)  Wt 195 lb (88.451 kg)  BMI 28.80 kg/m2  Physical Examination: General: Well developed, well nourished, NAD HEENT: OP clear, mucus membranes moist SKIN: warm, dry. No rashes. Neuro: No focal deficits Musculoskeletal: Muscle strength 5/5 all ext Psychiatric: Mood and affect normal Neck: No JVD, no carotid bruits, no thyromegaly, no lymphadenopathy. Lungs:Coarse BS bilaterally with no wheezes, crackles Cardiovascular: Regular rate and rhythm. No murmurs, gallops or rubs. Abdomen:Soft. Bowel sounds present. Non-tender.  Extremities: No lower extremity edema. Pulses are trace in the bilateral DP/PT.  EKG: Sinus brady, rate of 56 bpm. LBBB

## 2012-05-04 ENCOUNTER — Encounter (INDEPENDENT_AMBULATORY_CARE_PROVIDER_SITE_OTHER): Payer: Medicare Other

## 2012-05-04 DIAGNOSIS — I714 Abdominal aortic aneurysm, without rupture: Secondary | ICD-10-CM

## 2012-05-08 ENCOUNTER — Telehealth: Payer: Self-pay | Admitting: Cardiovascular Disease

## 2012-05-08 NOTE — Telephone Encounter (Signed)
Spoke with pt and reviewed abdominal aorta doppler results with her.  

## 2012-05-08 NOTE — Telephone Encounter (Signed)
New msg Pt wants to know tests

## 2012-06-16 LAB — CBC WITH DIFFERENTIAL/PLATELET
HCT: 42 %
MCH: 30.2
MCHC: 32
MCV: 94.6 fL
RDW: 13.5

## 2012-06-26 ENCOUNTER — Encounter: Payer: Medicare Other | Admitting: Internal Medicine

## 2012-06-26 DIAGNOSIS — J449 Chronic obstructive pulmonary disease, unspecified: Secondary | ICD-10-CM

## 2012-06-26 DIAGNOSIS — D649 Anemia, unspecified: Secondary | ICD-10-CM

## 2012-06-26 DIAGNOSIS — F17200 Nicotine dependence, unspecified, uncomplicated: Secondary | ICD-10-CM

## 2012-06-26 DIAGNOSIS — C3490 Malignant neoplasm of unspecified part of unspecified bronchus or lung: Secondary | ICD-10-CM

## 2012-07-04 ENCOUNTER — Other Ambulatory Visit: Payer: Self-pay | Admitting: Cardiovascular Disease

## 2012-07-24 ENCOUNTER — Encounter: Payer: Self-pay | Admitting: Gastroenterology

## 2012-07-25 ENCOUNTER — Encounter: Payer: Self-pay | Admitting: Gastroenterology

## 2012-07-25 ENCOUNTER — Ambulatory Visit (INDEPENDENT_AMBULATORY_CARE_PROVIDER_SITE_OTHER): Payer: Medicare Other | Admitting: Gastroenterology

## 2012-07-25 VITALS — BP 126/68 | HR 71 | Temp 97.4°F | Ht 69.0 in | Wt 195.8 lb

## 2012-07-25 DIAGNOSIS — K219 Gastro-esophageal reflux disease without esophagitis: Secondary | ICD-10-CM

## 2012-07-25 DIAGNOSIS — R1310 Dysphagia, unspecified: Secondary | ICD-10-CM

## 2012-07-25 DIAGNOSIS — R109 Unspecified abdominal pain: Secondary | ICD-10-CM

## 2012-07-25 DIAGNOSIS — R1314 Dysphagia, pharyngoesophageal phase: Secondary | ICD-10-CM

## 2012-07-25 DIAGNOSIS — K5900 Constipation, unspecified: Secondary | ICD-10-CM

## 2012-07-25 DIAGNOSIS — D649 Anemia, unspecified: Secondary | ICD-10-CM

## 2012-07-25 MED ORDER — HYDROCORTISONE ACETATE 25 MG RE SUPP: 25.0000 mg | Freq: Two times a day (BID) | RECTAL | Status: AC

## 2012-07-25 MED ORDER — PANTOPRAZOLE SODIUM 40 MG PO TBEC: 40.0000 mg | DELAYED_RELEASE_TABLET | Freq: Two times a day (BID) | ORAL | Status: DC

## 2012-07-25 MED ORDER — POLYETHYLENE GLYCOL 3350 17 GM/SCOOP PO POWD: 17.0000 g | Freq: Every day | ORAL | Status: AC

## 2012-07-25 NOTE — Patient Instructions (Addendum)
Please increase Protonix to twice a day, 30 minutes before breakfast and dinner.   Start taking your stool softener twice a day (may take at lunch and dinner). If loose stools, back off.   I have sent a prescription for Anusol suppositories to take twice a day for 7 days. This is due to your history of hemorrhoids. Stop the enemas for now.   You may take Miralax daily as needed for constipation.   Increase the fiber in your diet. Please see handout.   I have set you up for a CT scan of your abdomen. We will call you with the results.   Please return in 6 weeks. We will see how you are doing at that time. I will also be getting the blood work from Dr. Margo Aye. More may be needed, and we will call you if that is the case.    How to Increase Fiber in the Meal Plan for Diabetes Increasing fiber in the diet has many benefits including lowering blood cholesterol, helping to control blood glucose (sugar), preventing constipation, and aiding in weight management by helping you feel full longer. Start adding fiber to your diet slowly. A gradual substitution of high fiber foods for low fiber foods will allow the digestive tract to adjust. Most men under 32 years of age should aim to eat 38 g of fiber a day. Women should aim for 25 g. Over 64 years of age, most men need 30 g of fiber and most women need 21 g. Below are some suggestions for increasing fiber.  Try whole-wheat bread instead of white bread. Look for words high on the list of ingredients such as whole wheat, whole rye, or whole oats.   Try baked potato with skin instead of mashed potatoes.   Try a fresh apple with skin instead of applesauce.   Try a fresh orange instead of orange juice.   Try popcorn instead of potato chips.   Try bran cereal instead of corn flakes.   Try kidney, whole pinto, or garbanzo beans instead of bread.   Try whole-grain crackers instead of saltine crackers.   Try whole-wheat pasta instead of regular  varieties.   While on a high fiber diet, drink enough water and fluids to keep your urine clear or pale yellow.   Eat a variety of high fiber foods such as fruits, vegetables, whole grains, nuts, and seeds.   Try to increase your intake of fiber by eating high fiber foods instead of taking fiber pills or supplements that contain small amounts of fiber. There can be additional benefits for long-term health and blood glucose control with high fiber foods. Aim for 5 servings of fruit and vegetables per day.  SOURCES OF FIBER The following list shows the average dietary fiber for types of food in the various food groups. Starch and Bread / Dietary Fiber (g)  Whole-grain breads, 1 slice / 2 g   Whole grain,  cup / 2 g   Whole-grain cereals,  cup / 3 g   Bran cereals, ? to  cup / 8 g   Starchy vegetables,  cup / 3 g   Legumes (beans, peas, lentils),  cup / 8 g   Oatmeal,  cup / 2 g   Whole-wheat pasta, ? cup / 2 g   Brown rice,  cup / 2 g   Barley,  cup / 3 g  Meat and Meat Substitutes / Dietary Fiber (g) This group averages 0 grams of fiber.  Exceptions are:  Nuts, seeds, 1 oz or  cup / 3 g   Chunky peanut butter, 2 tbs / 3 g  Vegetables / Dietary Fiber (g)  Cooked vegetables,  to  cup / 2 to 3 g   Raw vegetables, 1 to 2 cups / 2 to 3 g  Fruit / Dietary Fiber (g)  Raw or cooked fruit,  cup or 1 small, fresh piece / 2 g  Milk / Dietary Fiber (g)  Milk, 1 cup or 8 oz / 0 g  Fats and Oils / Dietary Fiber (g)  Fats and oils, 1 tsp / 0 g  You can determine how much fiber you are eating by reading the Nutrition Facts panel on the labels of the foods you eat. FIBER IN SPECIFIC FOODS Cereals / Dietary Fiber (g)  All Bran, ? cup / 9 g   All Bran with Extra Fiber,  cup / 13 g   Bran Flakes,  cup / 4 g   Cheerios,  cup / 1.5 g   Corn Bran,  cup / 4 g   Corn Flakes,  cup / 0.75 g   Cracklin' Oat Bran,  cup / 4 g   Fiber One,  cup / 13 g    Grape Nuts, 3 tbs / 3 g   Grape Nuts Flakes,  cup / 3 g   Noodles,  cup, cooked / 0.5 g   Nutrigrain Wheat,  cup / 3.5 g   Oatmeal,  cup, cooked / 1.1 g   Pasta, white (macaroni, spaghetti),  cup, cooked / 0.5 g   Pasta, whole-wheat (macaroni, spaghetti),  cup, cooked / 2 g   [place],  cup, cooked / 3 g   Rice, wild, ? cup, cooked / 0.5 g   Rice, brown,  cup, cooked / 1 g   Rice, white,  cup, cooked / 0.2 g   Shredded Wheat, bite-sized,  cup / 2 g   Total,  cup / 1.75 g   Wheat Chex,  cup / 2.5 g   [place]heatena,  cup, cooked / 4 g   Wheaties,  cup / 2.75 g  Bread, Starchy Vegetables, and Dried Peas and Beans / Dietary Fiber (g)  Bagel, whole / 0.6 g   Baked beans in tomato sauce,  cup, cooked / 3 g   Bran muffin, 1 small / 2.5 g   Bread, cracked wheat, 1 slice / 2.5 g   Bread, pumpernickel, 1 slice / 2.5 g   Bread, white, 1 slice / 0.4 g   Bread, whole-wheat, 1 slice / 1.4 g   Corn,  cup, canned / 2.9 g   Kidney beans, ? cup, cooked / 3.5 g   Lentils, ? cup, cooked / 3 g   Lima beans,  cup, cooked / 4 g   Navy beans, ? cup, cooked / 4 g   Peas,  cup, cooked / 4 g   Popcorn, 3 cups popped, unbuttered / 3.5 g   Potato, baked (with skin), 1 small / 4 g   Potato, baked (without skin), 1 small / 2 g   Ry-Krisp, 4 crackers / 3 g   Saltine crackers, 6 squares / 0 g   Split peas, ? cup, cooked / 2.5 g   Yams (sweet potato), ? cup / 1.7 g  Fruit / Dietary Fiber (g)  Apple, 1 small, fresh, with skin / 4 g   Apple juice,  cup / 0.4 g  Apricots, 4 medium, fresh / 4 g   Apricots, 7 halves, dried / 2 g   Banana,  medium / 1.2 g   Blueberries,  cup / 2 g   Cantaloupe, ? melon / 1.3 g   Cherries,  cup, canned / 1.4 g   Grapefruit,  medium / 1.6 g   Grapes, 15 small / 1.2 g   Grape juice, ? cup / 0.5 g   Orange, 1 medium, fresh / 2 g   Orange juice,  cup / 0.5 g   Peach, 1 medium,fresh, with skin / 2  g   Pear, 1 medium, fresh, with skin / 4 g   Pineapple, ? cup, canned / 0.7 g   Plums, 2 whole / 2 g   Prunes, 3 whole / 1.5 g   Raspberries, 1 cup / 6 g   Strawberries, 1  cup / 4 g   Watermelon, 1  cup / 0.5 g  Vegetables / Dietary Fiber (g)  Asparagus,  cup, cooked / 1 g   Beans, green and wax,  cup, cooked / 1.6 g   Beets,  cup, cooked / 1.8 g   Broccoli,  cup, cooked / 2.2 g   Brussels sprouts,  cup, cooked / 4 g   Cabbage,  cup, cooked / 2.5 g   Carrots,  cup, cooked / 2.3 g   Cauliflower,  cup, cooked / 1.1 g   Celery, 1 cup, raw / 1.5 g   Cucumber, 1 cup, raw / 0.8 g   Green pepper,  cup sliced, cooked / 1.5 g   Lettuce, 1 cup, sliced / 0.9 g   Mushrooms, 1 cup sliced, raw / 1.8 g   Onion, 1 cup sliced, raw / 1.6 g   Spinach,  cup, cooked / 2.4 g   Tomato, 1 medium, fresh / 1.5 g   Tomato juice,  cup / 0 g   Zucchini,  cup, cooked / 1.8 g  Document Released: 04/23/2002 Document Revised: 10/21/2011 Document Reviewed: 05/20/2009 [facility] Patient Information 2012 [place], [place].

## 2012-07-25 NOTE — Progress Notes (Signed)
Referring Provider: Dwana Melena, MD Primary Care Physician:  Dwana Melena, MD Gastroenterologist: Dr. Darrick Penna   Chief Complaint  Patient presents with  . Abdominal Pain  . Constipation    HPI:   78 year old female with hx significant for dysphagia secondary to esophageal dysmotility, last EGD Aug 2012 with savary dilation. Also notable anemia, GERD, gastroparesis, epigastric pain, transaminitis, constipation. HBT in past negative for SBBO. Presents today down 26 lbs since last seen August 2012.  States when eats, everything stops upper abdomen. Has to drink something to make it move down. No discomfort upper abdomen. Occasional esophageal dysphagia. Mashes food up. Stays constipated, notes paper hematochezia and brb in stool. Has lower abdominal pain, not improved after BM. +abdominal bloating. BMs with little balls, will go 2-3 days without BM. Uses enemas for BMs. No medication for BMs. Stool softener once a day.  Decreased po intake, goes as long as she can then eats a little bit. States lower abdominal pain with eating. +regurgitation sometimes with eating.      Labs 2012:  Lab Results  Component Value Date   WBC 9.7 09/07/2011   HGB 8.1* 09/07/2011   HCT 25.6* 09/07/2011   MCV 93.1 09/07/2011   PLT 205 09/07/2011   Lab Results  Component Value Date   IRON 19* 04/16/2011   TIBC 362 04/16/2011   FERRITIN 14 04/16/2011    CURRENT LABS Aug 2013:  Hgb 13.4 Iron 83 TIBC 347 normal Ferritin 44 Past Medical History  Diagnosis Date  . Coronary artery disease     post diaphragmatic wall infarction on 06-2007,due to stent thrombosis   . Chronic obstructive pulmonary disease   . Tobacco abuse   . Hypertension   . Hyperlipidemia   . Diabetes mellitus     with gastroparesis 70% retention at 2 hours  . GERD (gastroesophageal reflux disease)   . Anxiety disorder   . Depression   . Hypothyroidism   . Hx of hysterectomy   . AAA (abdominal aortic aneurysm)   . Pulmonary nodule 04/2011   neg bx, followed by Dr. Edwyna Shell, may still be cancer, being followed with CTs.  . Esophageal dysmotility     Two BPE since 03/2011-->Moderate impairment of esophageal motility.Vallecular residuals. Laryngeal penetration  . Anemia     Past Surgical History  Procedure Date  . Cholecystectomy   . Cataract surg   . Bladder surgery     tack  . Kidney stone surgery   . Coronary stent placement 2004    2  . Coronary stent placement 2008    2  . S/p hysterectomy   . Neck surgery   . Esophagogastroduodenoscopy 5/08    normal esophagus, No H.Pylori  . Egd/bravo 07/2008    On Nexium BID. Day 1 DMSTR Score: 0.7, day 2: DMSTR score: 32, uncontrolled GERD, No H. Pylori  . Hbt 2009    negative  . Colonoscopy 05/2010    tortuous sigm colon, sigm tics, multiple simple adenomas, hemorrhoids, next TCS 10-15 yrs per Dr. Darrick Penna. Previous h/o tubular adenomas in 2008.   Marland Kitchen Esophagogastroduodenoscopy 05/2010    small hh, streaky erythema in body/antrum. Duodenal bx negative.   . Esophagogastroduodenoscopy 06/22/2011    mild gastritis/ring in the distal esophagus, savary dilation  . Savory dilation 06/22/2011    Procedure: SAVORY DILATION;  Surgeon: Arlyce Harman, MD;  Location: [facility];  Service: Endoscopy;  Laterality: N/A;  [name] dilation 06/22/2011    Procedure: [name] DILATION;  Surgeon: Terese Door  Fields, MD;  Location: [facility];  Service: Endoscopy;  Laterality: N/A;  . Video brochoscopy with electromagnetic navigaton 05/10/2011    [name]  . Right lower lobe superior segmentectomy October 2012    Current Outpatient Prescriptions  Medication Sig Dispense Refill  . albuterol (PROAIR HFA) 108 (90 BASE) MCG/ACT inhaler Inhale 2 puffs into the lungs every 6 (six) hours as needed.        . ALPRAZolam (XANAX) 0.5 MG tablet Take 0.5 mg by mouth 2 (two) times daily.        Marland Kitchen atorvastatin (LIPITOR) 20 MG tablet Take 20 mg by mouth daily.        . ferrous sulfate 325 (65 FE) MG tablet  Take 325 mg by mouth daily with breakfast.      . gabapentin (NEURONTIN) 300 MG capsule       . HYDROcodone-acetaminophen (LORTAB) 10-500 MG per tablet Take 1 tablet by mouth every 6 (six) hours as needed.       . hyoscyamine (LEVSIN SL) 0.125 MG SL tablet Place 0.125 mg under the tongue every 4 (four) hours as needed.        Marland Kitchen JANUVIA 50 MG tablet Take 50 mg by mouth daily.       Marland Kitchen levothyroxine (SYNTHROID, LEVOTHROID) 112 MCG tablet Take 112 mcg by mouth daily.       . metoCLOPramide (REGLAN) 5 MG tablet 1 PO QAC THREE TIMES A DAY  90 tablet  5  . NORVASC 5 MG tablet TAKE 1 TABLET DAILY  30 each  6  . pantoprazole (PROTONIX) 40 MG tablet Take 40 mg by mouth daily.        Marland Kitchen PLAVIX 75 MG tablet TAKE 1 TABLET DAILY  30 each  8  . Vitamin D, Ergocalciferol, (DRISDOL) 50000 UNITS CAPS Take 50,000 Units by mouth once a week.        Marland Kitchen DISCONTD: gabapentin (NEURONTIN) 100 MG capsule Take 300 mg by mouth 3 (three) times daily.       Marland Kitchen DISCONTD: levothyroxine (SYNTHROID, LEVOTHROID) 125 MCG tablet         Allergies as of 07/25/2012 - Review Complete 07/25/2012  Allergen Reaction Noted  . Sulfonamide derivatives      Family History  Problem Relation Age of Onset  . Coronary artery disease      family hx of  . Colon cancer Sister 85  . Colon cancer Daughter   . Prostate cancer      family hx of  . Lung cancer      family hx of  . Ovarian cancer      family hx of  . Arthritis      family hx of  . Other      family hx of chronic respiratory condition    History   Social History  . Marital Status: Widowed    Spouse Name: N/A    Number of Children: N/A  . Years of Education: N/A   Social History Main Topics  . Smoking status: Current Everyday Smoker  . Smokeless tobacco: None   Comment: She smokes 3 cigarretes daily  . Alcohol Use: No  . Drug Use: No  . Sexually Active: None   Other Topics Concern  . None   Social History Narrative  . None    Review of Systems: Gen: +  wt loss CV: Denies chest pain, palpitations, syncope, peripheral edema, and claudication. Resp: Denies dyspnea at rest, cough, wheezing, coughing up  blood, and pleurisy. GI: SEE HPI Derm: + dry skin Psych: Denies depression, anxiety, memory loss, confusion. No homicidal or suicidal ideation.  Heme: Denies bruising, bleeding, and enlarged lymph nodes.  Physical Exam: BP 126/68  Pulse 71  Temp 97.4 F (36.3 C) (Temporal)  Ht 5\' 9"  (1.753 m)  Wt 195 lb 12.8 oz (88.814 kg)  BMI 28.91 kg/m2 General:   Alert and oriented. No distress noted. Pleasant and cooperative.  Head:  Normocephalic and atraumatic. Eyes:  Conjuctiva clear without scleral icterus. Mouth:  Oral mucosa pink and moist.  Neck:  Supple, without mass or thyromegaly. Heart:  S1, S2 present without murmurs, rubs, or gallops. Regular rate and rhythm. Abdomen:  +BS, soft, TTP LLQ and non-distended. No rebound or guarding. No HSM or masses noted. Msk:  Symmetrical without gross deformities. Normal posture. Extremities:  Without edema. Neurologic:  Alert and  oriented x4;  grossly normal neurologically. Skin:  Intact without significant lesions or rashes. Cervical Nodes:  No significant cervical adenopathy. Psych:  Alert and cooperative. Normal mood and affect.

## 2012-07-26 DIAGNOSIS — R109 Unspecified abdominal pain: Secondary | ICD-10-CM | POA: Insufficient documentation

## 2012-07-26 NOTE — Progress Notes (Signed)
Faxed to PCP

## 2012-07-26 NOTE — Assessment & Plan Note (Signed)
Needs bowel regimen. TCS up-to-date. Low-volume hematochezia likely due to hx of internal hemorrhoids in setting of chronic constipation. Stool softener increase to BID High fiber Miralax prn Anusol.

## 2012-07-26 NOTE — Assessment & Plan Note (Signed)
Chronic. however with wt loss and fear of eating due to pain in lower abdomen concern for other etiologies. I'd like to evaluate with CT abd with special attention to mesenteric vasculature. Assess for mesenteric ischemia. EGD and TCS up to date. Constipation may be playing a role here.   CT abd/pelvis Further rec's under constipation 6 weeks f/u

## 2012-07-26 NOTE — Assessment & Plan Note (Signed)
Resolved. Ferritin and iron both normal, improved.

## 2012-07-26 NOTE — Assessment & Plan Note (Signed)
Increase PPI to BID. Underlying gastroparesis likely compounding symptoms.  6 week f/u.

## 2012-07-26 NOTE — Assessment & Plan Note (Signed)
Secondary to dysmotility. Stable.

## 2012-07-31 ENCOUNTER — Other Ambulatory Visit: Payer: Self-pay | Admitting: Gastroenterology

## 2012-07-31 ENCOUNTER — Ambulatory Visit (HOSPITAL_COMMUNITY)
Admission: RE | Admit: 2012-07-31 | Discharge: 2012-07-31 | Disposition: A | Discharge status: Home / Self Care | Payer: Medicare Other | Source: Ambulatory Visit | Attending: Gastroenterology | Admitting: Gastroenterology

## 2012-07-31 DIAGNOSIS — I714 Abdominal aortic aneurysm, without rupture, unspecified: Secondary | ICD-10-CM | POA: Insufficient documentation

## 2012-07-31 DIAGNOSIS — K5909 Other constipation: Secondary | ICD-10-CM | POA: Insufficient documentation

## 2012-07-31 DIAGNOSIS — Z85118 Personal history of other malignant neoplasm of bronchus and lung: Secondary | ICD-10-CM | POA: Insufficient documentation

## 2012-07-31 DIAGNOSIS — R109 Unspecified abdominal pain: Secondary | ICD-10-CM

## 2012-07-31 DIAGNOSIS — R634 Abnormal weight loss: Secondary | ICD-10-CM | POA: Insufficient documentation

## 2012-07-31 DIAGNOSIS — I701 Atherosclerosis of renal artery: Secondary | ICD-10-CM | POA: Insufficient documentation

## 2012-07-31 MED ORDER — IOHEXOL 350 MG/ML SOLN
80.0000 mL | Freq: Once | INTRAVENOUS | Status: AC | PRN
  Administered 2012-07-31: 80 mL via INTRAVENOUS

## 2012-07-31 NOTE — Progress Notes (Signed)
Quick Note:  For CT scan.  Please send to PCP for records. ______

## 2012-08-02 ENCOUNTER — Telehealth: Payer: Self-pay | Admitting: Gastroenterology

## 2012-08-02 NOTE — Progress Notes (Signed)
OFFER PT EVAL AT [facility] FOR DYSPHAGIA.  REVIEWED.

## 2012-08-02 NOTE — Telephone Encounter (Signed)
Informed pt. See results.

## 2012-08-02 NOTE — Telephone Encounter (Signed)
Pt called to check on her results. Please call her at [phone] She is anxious to know

## 2012-08-02 NOTE — Telephone Encounter (Signed)
Pt would like the CT results. She is aware that Tobi Bastos will review and we will call later today.

## 2012-08-03 NOTE — Progress Notes (Signed)
Referral has been sent to Dr. Kristian Covey and I spoke to [place] and she will contact the patient to set up date an time

## 2012-08-07 ENCOUNTER — Telehealth: Payer: Self-pay | Admitting: Gastroenterology

## 2012-08-07 NOTE — Telephone Encounter (Signed)
Returned pt's call. She said she is still having abdominal pain. Everytime she eats anything, even oatmeal, her stomach hurts and gripes. She is constipated and has a BM about every 2-3 days, but it is very little. Please advise!

## 2012-08-07 NOTE — Telephone Encounter (Signed)
Pt is taking the Protonix bid. She has not been taking Miralax daily, just mostly every other day. She will start it daily and hold if loose stools. She still wants to know what she can do for the abdominal pain and cramps after she eats anything. Please advise!

## 2012-08-07 NOTE — Telephone Encounter (Signed)
She needs to be taking her PPI BID. Needs to take Miralax daily for constipation, not prn. Hold if loose stools. Is she taking Miralax? May need to add Amitiza in near future. Needs to keep her appt to see [place] in a few weeks (I wanted to see her 6 weeks from last visit).

## 2012-08-07 NOTE — Telephone Encounter (Signed)
Ok, thanks for the update.  She has a hx of gastroparesis. Needs to have the diet mailed to her, reviewed with her. Hopefully improving her bowel regimen will alleviate some of these issues. I spoke with the radiologist about her CTA, and he didn't see evidence of mesenteric ischemia. She has hx of chronic abdominal pain as well. May need to try Amitiza or Linzess in future if Miralax does not help.

## 2012-08-07 NOTE — Telephone Encounter (Signed)
Pt called back and said to let you know that she gets nauseated every time she eats also, and then burps really big and it taste so sour. Please advise!

## 2012-08-07 NOTE — Telephone Encounter (Signed)
Pt called this morning asking to speak with the nurse. I told her that nurse was on other line and will have to call her back at [phone]

## 2012-08-07 NOTE — Progress Notes (Signed)
I reviewed CTA with Dr. Tyron Russell. 1 vessel disease. No indicators for mesenteric ischemia currently; HOWEVER, pt is at risk for progression in future. In this scenario, not dealing with mesenteric ischemia currently.

## 2012-08-08 NOTE — Telephone Encounter (Signed)
LMOM for a return call.  

## 2012-08-08 NOTE — Telephone Encounter (Signed)
Is she taking the Anusol suppositories?  Low-volume hematochezia secondary to hx of internal hemorrhoids. Needs to avoid constipation. Continue Miralax every day. If constipation does not improve, we will start Amitiza.

## 2012-08-08 NOTE — Telephone Encounter (Signed)
Pt returned call and was informed. i reminded her she is to be on the gastroparesis diet and mailed another copy. She said she did have a good BM this AM but she had bright red blood in the toilet and on the paper. Please advise!

## 2012-08-09 NOTE — Telephone Encounter (Signed)
Called and spoke to [place], pt's daughter. She said pt is asleep now, she thinks she is using the suppositories but she will ask her when she gets up and call back and let us know.

## 2012-08-09 NOTE — Telephone Encounter (Signed)
LMOM for pt to call. 

## 2012-08-11 ENCOUNTER — Telehealth: Payer: Self-pay | Admitting: Gastroenterology

## 2012-08-11 NOTE — Telephone Encounter (Signed)
Patient is scheduled with Dr. Kristian Covey on 09/19/12 at 11:30 and patient has been notified

## 2012-08-14 NOTE — Telephone Encounter (Signed)
Called and spoke with pt. She said she is not using the Anusol suppositories every day, just sometimes. She said that she is doing Miralax every day and constipation is much better.

## 2012-08-15 NOTE — Telephone Encounter (Signed)
Glad to hear constipation is better. Do Anusol every day for 5 days. Keep appt for October.

## 2012-08-15 NOTE — Telephone Encounter (Signed)
Called and informed pt. She is aware of her appt on 09/05/2012 @ 9:00 Am with Gerrit Halls, NP.

## 2012-09-05 ENCOUNTER — Encounter: Payer: Self-pay | Admitting: Gastroenterology

## 2012-09-05 ENCOUNTER — Telehealth: Payer: Self-pay | Admitting: Gastroenterology

## 2012-09-05 ENCOUNTER — Ambulatory Visit (INDEPENDENT_AMBULATORY_CARE_PROVIDER_SITE_OTHER): Payer: Medicare Other | Admitting: Gastroenterology

## 2012-09-05 VITALS — BP 138/75 | HR 68 | Temp 97.1°F | Ht 69.0 in | Wt 200.0 lb

## 2012-09-05 DIAGNOSIS — K219 Gastro-esophageal reflux disease without esophagitis: Secondary | ICD-10-CM

## 2012-09-05 DIAGNOSIS — K5900 Constipation, unspecified: Secondary | ICD-10-CM

## 2012-09-05 DIAGNOSIS — R109 Unspecified abdominal pain: Secondary | ICD-10-CM

## 2012-09-05 MED ORDER — LUBIPROSTONE 24 MCG PO CAPS: 24.0000 ug | ORAL_CAPSULE | Freq: Two times a day (BID) | ORAL | Status: DC

## 2012-09-05 NOTE — Progress Notes (Signed)
Referring Provider: Dwana Melena, MD Primary Care Physician:  Dwana Melena, MD Primary Gastroenterologist: Dr. Darrick Penna   Chief Complaint  Patient presents with  . Follow up  . Abdominal Pain    HPI:   78 year old female with hx significant for dysphagia secondary to esophageal dysmotility, last EGD Aug 2012 with savary dilation. Also notable anemia, GERD, gastroparesis, epigastric pain, transaminitis, constipation. HBT in past negative for SBBO. Normocytic anemia resolved. Last Ferritin and iron normal. At last appt in Sept 2013, increased PPI to BID, added Miralax, stool softener, Anusol due to low-volume hematochezia secondary to known internal hemorrhoids. CTA performed due to wt loss , concern for mesenteric ischemia. CTA reviewed with Dr. Tyron Russell, 1 vessel disease, no indicators for mesenteric ischemia currently. Pt is at risk for progression in future. ALSO noted left renal artery stenosis, referred to nephrologist. Known aneurysm, followed by cardiology.   Wt is up 5 lbs from last visit in Sept 2013. States +abdominal bloating. States eats a small amount of food then 5-10 minutes has cramping in stomach, lower abdomen. Then feels like has to go to bathroom but only passes gas. Hasn't eaten anything since yesterday at 1pm. Notes lower abdominal pain still present. +gassy. Miralax every day. Not having bowel movement every day. Notes just small pieces of stool. Notes reflux is intermittent, sometimes real bad. Sometimes heaving real bad. Uses fork to mash up everything she eats. Will sometimes gets choked on tiny pieces of food. Only rare low-volume hematochezia.   Past Medical History  Diagnosis Date  . Coronary artery disease     post diaphragmatic wall infarction on 06-2007,due to stent thrombosis   . Chronic obstructive pulmonary disease   . Tobacco abuse   . Hypertension   . Hyperlipidemia   . Diabetes mellitus     with gastroparesis 70% retention at 2 hours  . GERD (gastroesophageal reflux  disease)   . Anxiety disorder   . Depression   . Hypothyroidism   . Hx of hysterectomy   . AAA (abdominal aortic aneurysm)   . Pulmonary nodule 04/2011    neg bx, followed by Dr. Edwyna Shell, may still be cancer, being followed with CTs.  . Esophageal dysmotility     Two BPE since 03/2011-->Moderate impairment of esophageal motility.Vallecular residuals. Laryngeal penetration  . Anemia     Past Surgical History  Procedure Date  . Cholecystectomy   . Cataract surg   . Bladder surgery     tack  . Kidney stone surgery   . Coronary stent placement 2004    2  . Coronary stent placement 2008    2  . S/p hysterectomy   . Neck surgery   . Esophagogastroduodenoscopy 5/08    normal esophagus, No H.Pylori  . Egd/bravo 07/2008    On Nexium BID. Day 1 DMSTR Score: 0.7, day 2: DMSTR score: 32, uncontrolled GERD, No H. Pylori  . Hbt 2009    negative  . Colonoscopy 05/2010    tortuous sigm colon, sigm tics, multiple simple adenomas, hemorrhoids, next TCS 10-15 yrs per Dr. Darrick Penna. Previous h/o tubular adenomas in 2008.   Marland Kitchen Esophagogastroduodenoscopy 05/2010    small hh, streaky erythema in body/antrum. Duodenal bx negative.   . Esophagogastroduodenoscopy 06/22/2011    mild gastritis/ring in the distal esophagus, savary dilation  . Savory dilation 06/22/2011    Procedure: SAVORY DILATION;  Surgeon: Arlyce Harman, MD;  Location: [facility];  Service: Endoscopy;  Laterality: N/A;  [name] dilation 06/22/2011  Procedure: MALONEY DILATION;  Surgeon: Arlyce Harman, MD;  Location: [facility];  Service: Endoscopy;  Laterality: N/A;  . Video brochoscopy with electromagnetic navigaton 05/10/2011    [name]  . Right lower lobe superior segmentectomy October 2012  . Hysterectomy at age 55     Current Outpatient Prescriptions  Medication Sig Dispense Refill  . albuterol (PROAIR HFA) 108 (90 BASE) MCG/ACT inhaler Inhale 2 puffs into the lungs every 6 (six) hours as needed.        .  ALPRAZolam (XANAX) 0.5 MG tablet Take 0.5 mg by mouth 2 (two) times daily.        Marland Kitchen atorvastatin (LIPITOR) 20 MG tablet Take 20 mg by mouth daily.        . ferrous sulfate 325 (65 FE) MG tablet Take 325 mg by mouth daily with breakfast. Just takes 3-4 tablets weekly      . gabapentin (NEURONTIN) 300 MG capsule 300 mg 3 (three) times daily.       Marland Kitchen HYDROcodone-acetaminophen (LORTAB) 10-500 MG per tablet Take 1 tablet by mouth every 6 (six) hours as needed.       Marland Kitchen JANUVIA 50 MG tablet Take 50 mg by mouth daily.       Marland Kitchen levothyroxine (SYNTHROID, LEVOTHROID) 112 MCG tablet Take 112 mcg by mouth daily.       . metoCLOPramide (REGLAN) 5 MG tablet 1 PO QAC THREE TIMES A DAY  90 tablet  5  . NORVASC 5 MG tablet TAKE 1 TABLET DAILY  30 each  6  . pantoprazole (PROTONIX) 40 MG tablet Take 1 tablet (40 mg total) by mouth 2 (two) times daily.  60 tablet  3  . PLAVIX 75 MG tablet TAKE 1 TABLET DAILY  30 each  8  . Vitamin D, Ergocalciferol, (DRISDOL) 50000 UNITS CAPS Take 50,000 Units by mouth once a week.        . hyoscyamine (LEVSIN SL) 0.125 MG SL tablet Place 0.125 mg under the tongue every 4 (four) hours as needed.          Allergies as of 09/05/2012 - Review Complete 09/05/2012  Allergen Reaction Noted  . Sulfonamide derivatives      Family History  Problem Relation Age of Onset  . Coronary artery disease      family hx of  . Colon cancer Sister 67  . Colon cancer Daughter   . Prostate cancer      family hx of  . Lung cancer      family hx of  . Ovarian cancer      family hx of  . Arthritis      family hx of  . Other      family hx of chronic respiratory condition    History   Social History  . Marital Status: Widowed    Spouse Name: N/A    Number of Children: N/A  . Years of Education: N/A   Social History Main Topics  . Smoking status: Current Every Day Smoker    Types: Cigarettes  . Smokeless tobacco: None   Comment: She smokes 3 cigarretes daily  . Alcohol Use: No  .  Drug Use: No  . Sexually Active: None   Other Topics Concern  . None   Social History Narrative  . None    Review of Systems: Gen: SEE HPI CV: Denies chest pain, palpitations, syncope, peripheral edema, and claudication. Resp: Denies SOB, coughing, wheeze GI: SEE HPI Derm: Denies  rash, itching, dry skin Psych: Denies depression, anxiety, memory loss, confusion. No homicidal or suicidal ideation.  Heme: Denies bruising, bleeding, and enlarged lymph nodes.  Physical Exam: BP 138/75  Pulse 68  Temp 97.1 F (36.2 C) (Tympanic)  Ht 5\' 9"  (1.753 m)  Wt 200 lb (90.719 kg)  BMI 29.53 kg/m2 General:   Alert and oriented. No distress noted. Pleasant and cooperative.  Head:  Normocephalic and atraumatic. Eyes:  Conjuctiva clear without scleral icterus. Abdomen:  +BS, soft, non-tender and non-distended. No rebound or guarding. No HSM or masses noted. Msk:  Symmetrical without gross deformities. Normal posture. Extremities:  Without edema. Neurologic:  Alert and  oriented x4;  grossly normal neurologically. Skin:  Intact without significant lesions or rashes. Psych:  Alert and cooperative. Normal mood and affect.

## 2012-09-05 NOTE — Assessment & Plan Note (Signed)
Occasional. Hx of gastroparesis, esophageal dysmotility. Slight improvement with Protonix BID. Refer to [facility] secondary to dysmotility per Dr. Darrick Penna.

## 2012-09-05 NOTE — Assessment & Plan Note (Signed)
CTA performed in interim from last visit, negative for mesenteric ischemia. Reviewed with Dr. Tyron Russell. Notes abdominal discomfort lower abdomen sometimes constant, urge to have BM. TCS in 2011 as above. Likely dealing with abdominal pain secondary to IBS, inadequate constipation regimen. Wt up 5 lbs from last visit. Add Amitiza, probiotic. Return in 3 mos with Dr. Darrick Penna. As side note, referred to nephrology due to incidental finding of renal artery stenosis on CTA.

## 2012-09-05 NOTE — Progress Notes (Signed)
Faxed to PCP

## 2012-09-05 NOTE — Assessment & Plan Note (Signed)
No real improvement with Miralax. Complains of bloating, gas. Negative HBT in past. Add Amitiza 24 mcg BID WITH FOOD, probiotic. Return in 3 mos with Dr. Darrick Penna.

## 2012-09-05 NOTE — Patient Instructions (Addendum)
Start taking Amitiza 24 mcg WITH FOOD twice a day. Samples have been provided, and I have sent the prescription to your pharmacy.  Start taking a probiotic daily. Align samples have been provided. You may get this over-the-counter or use [facility] [employer].  Continue Protonix twice a day. We have also referred you to [facility] for further evaluation of your difficulty swallowing.  Return to see Dr. Darrick Penna in 3 months.

## 2012-09-05 NOTE — Telephone Encounter (Signed)
Referral has been faxed over to [facility] and they will contact the patient to set up date & time

## 2012-09-12 ENCOUNTER — Other Ambulatory Visit: Payer: Self-pay | Admitting: Cardiovascular Disease

## 2012-09-20 ENCOUNTER — Encounter: Payer: Self-pay | Admitting: Cardiovascular Disease

## 2012-09-27 ENCOUNTER — Encounter: Payer: Self-pay | Admitting: Gastroenterology

## 2012-09-27 NOTE — Progress Notes (Signed)
REVIEWED.  E30 JAN 2014 GERD,CPNSTIPATION, ABD PAIN

## 2012-09-27 NOTE — Progress Notes (Signed)
Pt seen by Dr. Kristian Covey due to renal artery stenosis.  Plans for 24 hour urine, labs, return in 6 weeks. Will scan outside note into system.

## 2012-09-28 NOTE — Progress Notes (Signed)
Reminder in epic to follow up in January 2014

## 2012-10-24 ENCOUNTER — Ambulatory Visit: Payer: Medicare Other | Admitting: Cardiovascular Disease

## 2012-10-24 ENCOUNTER — Encounter: Payer: Self-pay | Admitting: Gastroenterology

## 2012-10-26 ENCOUNTER — Other Ambulatory Visit: Payer: Self-pay | Admitting: Cardiovascular Disease

## 2012-11-16 ENCOUNTER — Encounter: Payer: Self-pay | Admitting: Cardiovascular Disease

## 2012-11-16 ENCOUNTER — Ambulatory Visit (INDEPENDENT_AMBULATORY_CARE_PROVIDER_SITE_OTHER): Payer: Medicare Other | Admitting: Cardiovascular Disease

## 2012-11-16 VITALS — BP 142/85 | HR 77 | Ht 69.0 in | Wt 192.0 lb

## 2012-11-16 DIAGNOSIS — I2510 Atherosclerotic heart disease of native coronary artery without angina pectoris: Secondary | ICD-10-CM

## 2012-11-16 DIAGNOSIS — Z720 Tobacco use: Secondary | ICD-10-CM

## 2012-11-16 DIAGNOSIS — I6529 Occlusion and stenosis of unspecified carotid artery: Secondary | ICD-10-CM

## 2012-11-16 DIAGNOSIS — F17200 Nicotine dependence, unspecified, uncomplicated: Secondary | ICD-10-CM

## 2012-11-16 DIAGNOSIS — I714 Abdominal aortic aneurysm, without rupture: Secondary | ICD-10-CM

## 2012-11-16 NOTE — Patient Instructions (Signed)
Your physician recommends that you schedule a follow-up appointment in:  1 month.  Your physician has requested that you have a lexiscan myoview. For further information please visit [email or web address] Please follow instruction sheet, as given.

## 2012-11-16 NOTE — Progress Notes (Signed)
History of Present Illness: 79 yo WF with history of CAD, HTN, Hyperlipidemia, DM, GERD, anxiety/depression here today for cardiac follow up. She has been followed in the past by Dr. Juanda Chance. 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 A new DES was also placed at that time. She previously had a Cypher stent in the circumflex artery as well. An event monitor showed only isolated PVCs and APCs. She has chronic shortness of breath related to her COPD. She had an MRI in June 2011 which showed a 3.6 x 3.6 cm infrarenal aortic aneurysm. She has chronic back pain felt to be secondary lumbar disc bulging but not felt to be a surgical candidate. She also has esophageal strictures and had an esophageal dilatation in 2012. She has been on Simvastatin for years. Her lipids are followed by Dr. Margo Aye. She had chest pian in 2012 and I arranged a left heart cath on 07/09/11. This showed mild to moderate stable disease. She had a right lower lobectomy for stage IA non-small cell lung cancer per Dr. Edwyna Shell in October 2012. She did well with that procedure.   She is here today for follow up. She has been on Plavix per Dr. Juanda Chance for lifetime. Her ASA was stopped in the hospital in October. She has continued to smoke, 3 cigarettes per day. She tells me today that she has been having daily epigastric pain, some at rest and some with exertion. The pain only lasts for a few seconds up to a minute. She wishes to stop smoking.   Primary Care Physician: Dr. Margo Aye   Last Lipid Profile: Per primary care.   Past Medical History  Diagnosis Date  . Coronary artery disease     post diaphragmatic wall infarction on 06-2007,due to stent thrombosis   . Chronic obstructive pulmonary disease   . Tobacco abuse   . Hypertension   . Hyperlipidemia   . Diabetes mellitus     with gastroparesis 70% retention at 2 hours  . GERD (gastroesophageal reflux disease)   . Anxiety  disorder   . Depression   . Hypothyroidism   . Hx of hysterectomy   . AAA (abdominal aortic aneurysm)   . Pulmonary nodule 04/2011    neg bx, followed by Dr. Edwyna Shell, may still be cancer, being followed with CTs.  . Esophageal dysmotility     Two BPE since 03/2011-->Moderate impairment of esophageal motility.Vallecular residuals. Laryngeal penetration  . Anemia     Past Surgical History  Procedure Date  . Cholecystectomy   . Cataract surg   . Bladder surgery     tack  . Kidney stone surgery   . Coronary stent placement 2004    2  . Coronary stent placement 2008    2  . S/p hysterectomy   . Neck surgery   . Esophagogastroduodenoscopy 5/08    normal esophagus, No H.Pylori  . Egd/bravo 07/2008    On Nexium BID. Day 1 DMSTR Score: 0.7, day 2: DMSTR score: 32, uncontrolled GERD, No H. Pylori  . Hbt 2009    negative  . Colonoscopy 05/2010    tortuous sigm colon, sigm tics, multiple simple adenomas, hemorrhoids, next TCS 10-15 yrs per Dr. Darrick Penna. Previous h/o tubular adenomas in 2008.   Marland Kitchen Esophagogastroduodenoscopy 05/2010    small hh, streaky erythema in body/antrum. Duodenal bx negative.   . Esophagogastroduodenoscopy 06/22/2011    mild gastritis/ring in the distal esophagus, savary dilation  .  Savory dilation 06/22/2011    Procedure: SAVORY DILATION;  Surgeon: Arlyce Harman, MD;  Location: [facility];  Service: Endoscopy;  Laterality: N/A;  [name] dilation 06/22/2011    Procedure: [name] DILATION;  Surgeon: Arlyce Harman, MD;  Location: [facility];  Service: Endoscopy;  Laterality: N/A;  . Video brochoscopy with electromagnetic navigaton 05/10/2011    [name]  . Right lower lobe superior segmentectomy October 2012  . Hysterectomy at age 89     Current Outpatient Prescriptions  Medication Sig Dispense Refill  . albuterol (PROAIR HFA) 108 (90 BASE) MCG/ACT inhaler Inhale 2 puffs into the lungs every 6 (six) hours as needed.        . ALPRAZolam (XANAX) 0.5 MG  tablet Take 0.5 mg by mouth 2 (two) times daily.        Marland Kitchen amLODipine (NORVASC) 5 MG tablet TAKE 1 TABLET DAILY  30 tablet  5  . atorvastatin (LIPITOR) 20 MG tablet Take 20 mg by mouth daily.        . clopidogrel (PLAVIX) 75 MG tablet TAKE 1 TABLET DAILY  30 tablet  12  . ferrous sulfate 325 (65 FE) MG tablet Take 325 mg by mouth daily with breakfast. Just takes 3-4 tablets weekly      . gabapentin (NEURONTIN) 300 MG capsule 300 mg 3 (three) times daily.       Marland Kitchen HYDROcodone-acetaminophen (LORTAB) 10-500 MG per tablet Take 1 tablet by mouth every 6 (six) hours as needed.       . hyoscyamine (LEVSIN SL) 0.125 MG SL tablet Place 0.125 mg under the tongue every 4 (four) hours as needed.        Marland Kitchen JANUVIA 50 MG tablet Take 50 mg by mouth daily.       Marland Kitchen levothyroxine (SYNTHROID, LEVOTHROID) 112 MCG tablet Take 112 mcg by mouth daily.       Marland Kitchen lubiprostone (AMITIZA) 24 MCG capsule Take 1 capsule (24 mcg total) by mouth 2 (two) times daily with a meal.  60 capsule  3  . metoCLOPramide (REGLAN) 5 MG tablet 1 PO QAC THREE TIMES A DAY  90 tablet  5  . pantoprazole (PROTONIX) 40 MG tablet Take 1 tablet (40 mg total) by mouth 2 (two) times daily.  60 tablet  3  . Vitamin D, Ergocalciferol, (DRISDOL) 50000 UNITS CAPS Take 50,000 Units by mouth once a week.          Allergies  Allergen Reactions  . Sulfonamide Derivatives     History   Social History  . Marital Status: Widowed    Spouse Name: N/A    Number of Children: N/A  . Years of Education: N/A   Occupational History  . Not on file.   Social History Main Topics  . Smoking status: Current Every Day Smoker    Types: Cigarettes  . Smokeless tobacco: Not on file     Comment: She smokes 3 cigarretes daily  . Alcohol Use: No  . Drug Use: No  . Sexually Active: Not on file   Other Topics Concern  . Not on file   Social History Narrative  . No narrative on file    Family History  Problem Relation Age of Onset  . Coronary artery disease       family hx of  . Colon cancer Sister 39  . Colon cancer Daughter   . Prostate cancer      family hx of  . Lung cancer  family hx of  . Ovarian cancer      family hx of  . Arthritis      family hx of  . Other      family hx of chronic respiratory condition    Review of Systems:  As stated in the HPI and otherwise negative.   BP 142/85  Pulse 77  Ht 5\' 9"  (1.753 m)  Wt 192 lb (87.091 kg)  BMI 28.35 kg/m2  Physical Examination: General: Well developed, well nourished, NAD HEENT: OP clear, mucus membranes moist SKIN: warm, dry. No rashes. Neuro: No focal deficits Musculoskeletal: Muscle strength 5/5 all ext Psychiatric: Mood and affect normal Neck: No JVD, no carotid bruits, no thyromegaly, no lymphadenopathy. Lungs:Clear bilaterally, no wheezes, rhonci, crackles Cardiovascular: Regular rate and rhythm. No murmurs, gallops or rubs. Abdomen:Soft. Bowel sounds present. Non-tender.  Extremities: No lower extremity edema. Pulses are 2 + in the bilateral DP/PT.  EKG: NSR, Non-specific ST and T wave changes.   Assessment and Plan:   1. CAD: She is having resting and exertional chest pains. Most recent cath was in August 2012 and her disease was stable. She has continued to smoke. Will arrange Lexiscan myoview stress test to exclude ischemia.  Lipids controlled per pt in primary care. BP is well controlled. Continue Plavix and statin.   2. Tobacco abuse: She is cutting back. Smoking cessation encouraged. She wishes to stop.   3. AAA:  Stable by u/s June 2013. Repeat u/s one year.    4. Carotid artery disease: Stable by dopplers April 2013.

## 2012-11-21 ENCOUNTER — Encounter (HOSPITAL_COMMUNITY): Payer: Medicare Other

## 2012-11-28 ENCOUNTER — Other Ambulatory Visit: Payer: Self-pay | Admitting: Gastroenterology

## 2012-11-28 ENCOUNTER — Encounter (HOSPITAL_COMMUNITY): Payer: Medicare Other

## 2012-12-05 ENCOUNTER — Ambulatory Visit (HOSPITAL_COMMUNITY): Payer: Medicare Other | Attending: Cardiology | Admitting: Radiology

## 2012-12-05 VITALS — BP 105/62 | HR 63 | Ht 69.0 in | Wt 195.0 lb

## 2012-12-05 DIAGNOSIS — R0602 Shortness of breath: Secondary | ICD-10-CM

## 2012-12-05 DIAGNOSIS — I714 Abdominal aortic aneurysm, without rupture: Secondary | ICD-10-CM

## 2012-12-05 DIAGNOSIS — R0989 Other specified symptoms and signs involving the circulatory and respiratory systems: Secondary | ICD-10-CM | POA: Insufficient documentation

## 2012-12-05 DIAGNOSIS — I2510 Atherosclerotic heart disease of native coronary artery without angina pectoris: Secondary | ICD-10-CM

## 2012-12-05 DIAGNOSIS — F17200 Nicotine dependence, unspecified, uncomplicated: Secondary | ICD-10-CM | POA: Insufficient documentation

## 2012-12-05 DIAGNOSIS — I447 Left bundle-branch block, unspecified: Secondary | ICD-10-CM | POA: Insufficient documentation

## 2012-12-05 DIAGNOSIS — R079 Chest pain, unspecified: Secondary | ICD-10-CM

## 2012-12-05 DIAGNOSIS — R0609 Other forms of dyspnea: Secondary | ICD-10-CM | POA: Insufficient documentation

## 2012-12-05 DIAGNOSIS — I10 Essential (primary) hypertension: Secondary | ICD-10-CM | POA: Insufficient documentation

## 2012-12-05 DIAGNOSIS — R0789 Other chest pain: Secondary | ICD-10-CM | POA: Insufficient documentation

## 2012-12-05 DIAGNOSIS — E119 Type 2 diabetes mellitus without complications: Secondary | ICD-10-CM | POA: Insufficient documentation

## 2012-12-05 DIAGNOSIS — I739 Peripheral vascular disease, unspecified: Secondary | ICD-10-CM | POA: Insufficient documentation

## 2012-12-05 MED ORDER — REGADENOSON 0.4 MG/5ML IV SOLN
0.4000 mg | Freq: Once | INTRAVENOUS | Status: AC
  Administered 2012-12-05: 0.4 mg via INTRAVENOUS

## 2012-12-05 MED ORDER — TECHNETIUM TC 99M SESTAMIBI GENERIC - CARDIOLITE
11.0000 | Freq: Once | INTRAVENOUS | Status: AC | PRN
  Administered 2012-12-05: 11 via INTRAVENOUS

## 2012-12-05 MED ORDER — AMINOPHYLLINE 25 MG/ML IV SOLN
75.0000 mg | Freq: Once | INTRAVENOUS | Status: AC
  Administered 2012-12-05: 75 mg via INTRAVENOUS

## 2012-12-05 MED ORDER — TECHNETIUM TC 99M SESTAMIBI GENERIC - CARDIOLITE
33.0000 | Freq: Once | INTRAVENOUS | Status: AC | PRN
  Administered 2012-12-05: 33 via INTRAVENOUS

## 2012-12-05 NOTE — Progress Notes (Signed)
[facility] SITE 3 NUCLEAR MED [address] [place], [place] [address] [phone]    Cardiology Nuclear Med Study  Madison Henson is a 79 y.o. female     MRN : [id number]     DOB: [birth year]  Procedure Date: 12/05/2012  Nuclear Med Background Indication for Stress Test:  Evaluation for Ischemia and Stent Patency History:  '08 DMI>PTCA/Stent-RCA; '10 GEX:BMWU inferior ischemia, EF=66%; '12 Echo:EF=60%; '12 Cath:Patent Stents, mild-moderate stabel dz, EF=50%; '13 AAA 3.6 cm Cardiac Risk Factors: Carotid Disease, Family History - CAD, Hypertension, LBBB, Lipids, NIDDM, PVD and Smoker  Symptoms:  Chest Tightness with and without Exertion (last episode of chest discomfort was 4-5 days ago), Chronic DOE and SOB   Nuclear Pre-Procedure Caffeine/Decaff Intake:  None NPO After: 7:00pm   Lungs:  Rhonchi on right side, but no wheezing. O2 Sat: 92% on room air. IV 0.9% NS with Angio Cath:  24g  IV Site: L Forearm  IV Started by:  Cathlyn Parsons, RN  Chest Size (in):  40 Cup Size: C  Height: 5\' 9"  (1.753 m)  Weight:  195 lb (88.451 kg)  BMI:  Body mass index is 28.80 kg/(m^2). Tech Comments:  Med's taken except diabetic med's    Nuclear Med Study 1 or 2 day study: 1 day  Stress Test Type:  Lexiscan  Reading MD: Olga Millers, MD  Order Authorizing Provider:  Verne Carrow ,MD  Resting Radionuclide: Technetium 44m Sestamibi  Resting Radionuclide Dose: 11.0 mCi   Stress Radionuclide:  Technetium 7m Sestamibi  Stress Radionuclide Dose: 33.0 mCi           Stress Protocol Rest HR: 63 Stress HR: 89  Rest BP: 105/62 Stress BP: 111/63  Exercise Time (min): n/a METS: n/a   Predicted Max HR: 138 bpm % Max HR: 64.49 bpm Rate Pressure Product: 9879    Dose of Adenosine (mg):  n/a Dose of Lexiscan: 0.4 mg  Dose of Atropine (mg): n/a Dose of Dobutamine: n/a mcg/kg/min (at max HR)  Stress Test Technologist: Smiley Houseman, CMA-N  Nuclear Technologist:  Domenic Polite, CNMT     Rest Procedure:  Myocardial perfusion imaging was performed at rest 45 minutes following the intravenous administration of Technetium 92m Sestamibi.  Rest ECG: NSR-LBBB  Stress Procedure:  The patient received IV Lexiscan 0.4 mg over 15-seconds.  Technetium 27m Sestamibi injected at 30-seconds.  She did c/o chest tightness 9/10 with Lexiscan and was give Aminophylline 75 mg with some relief, 5/10.  She was given a cup of coffee after Lexiscan and chest tightness was relieved completely prior to stress images.  Quantitative spect images were obtained after a 45 minute delay.  Stress ECG: Uninteretable due to baseline LBBB  QPS Raw Data Images:  Acquisition technically good; normal left ventricular size. Stress Images:  There is decreased uptake in the septum and apex. Rest Images:  There is decreased uptake in the septum and apex. Subtraction (SDS):  No evidence of ischemia. Transient Ischemic Dilatation (Normal <1.22):  0.89 Lung/Heart Ratio (Normal <0.45):  0.35  Quantitative Gated Spect Images QGS EDV:  106 ml QGS ESV:  45 ml  Impression Exercise Capacity:  Lexiscan with no exercise. BP Response:  Normal blood pressure response. Clinical Symptoms:  There is chest tightness. ECG Impression:  Baseline:  LBBB.  EKG uninterpretable due to LBBB at rest and stress. Comparison with Prior Nuclear Study: No images to compare  Overall Impression:  Low risk stress nuclear study with a large,  moderate intensity, fixed septal and apical defect consistent with infarct vs LBBB; no significant ischemia.  LV Ejection Fraction: 58%.  LV Wall Motion:  NL LV Function; NL Wall Motion  Olga Millers

## 2012-12-06 ENCOUNTER — Telehealth: Payer: Self-pay | Admitting: Cardiovascular Disease

## 2012-12-06 NOTE — Telephone Encounter (Signed)
New problem:   No information was disclose wants the nurse to call her back.

## 2012-12-06 NOTE — Telephone Encounter (Signed)
Spoke with pt and reviewed stress test results with her.  

## 2012-12-19 ENCOUNTER — Ambulatory Visit: Payer: Medicare Other | Admitting: Cardiovascular Disease

## 2013-01-12 ENCOUNTER — Encounter: Payer: Medicare Other | Admitting: Internal Medicine

## 2013-01-12 DIAGNOSIS — J449 Chronic obstructive pulmonary disease, unspecified: Secondary | ICD-10-CM

## 2013-01-12 DIAGNOSIS — C3490 Malignant neoplasm of unspecified part of unspecified bronchus or lung: Secondary | ICD-10-CM

## 2013-01-12 DIAGNOSIS — G8929 Other chronic pain: Secondary | ICD-10-CM

## 2013-01-12 DIAGNOSIS — F17200 Nicotine dependence, unspecified, uncomplicated: Secondary | ICD-10-CM

## 2013-01-15 ENCOUNTER — Ambulatory Visit: Payer: Medicare Other | Admitting: Cardiovascular Disease

## 2013-02-08 ENCOUNTER — Ambulatory Visit (INDEPENDENT_AMBULATORY_CARE_PROVIDER_SITE_OTHER): Payer: Medicare Other | Admitting: Cardiovascular Disease

## 2013-02-08 ENCOUNTER — Encounter: Payer: Self-pay | Admitting: Cardiovascular Disease

## 2013-02-08 VITALS — BP 111/62 | HR 61 | Ht 69.0 in | Wt 197.0 lb

## 2013-02-08 DIAGNOSIS — I714 Abdominal aortic aneurysm, without rupture: Secondary | ICD-10-CM

## 2013-02-08 DIAGNOSIS — I2510 Atherosclerotic heart disease of native coronary artery without angina pectoris: Secondary | ICD-10-CM

## 2013-02-08 NOTE — Patient Instructions (Addendum)
Your physician wants you to follow-up in:  6 months.  You will receive a reminder letter in the mail two months in advance. If you don't receive a letter, please call our office to schedule the follow-up appointment.  Your physician has requested that you have an abdominal aorta duplex. During this test, an ultrasound is used to evaluate the aorta. Allow 30 minutes for this exam. Do not eat after midnight the day before and avoid carbonated beverages.  To be done in June 2014

## 2013-02-08 NOTE — Progress Notes (Signed)
History of Present Illness: 79 yo WF with history of CAD, HTN, Hyperlipidemia, DM, GERD, anxiety/depression here today for cardiac follow up. She has been followed in the past by Dr. Juanda Chance. 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 A new DES was also placed at that time. She previously had a Cypher stent in the circumflex artery as well. An event monitor showed only isolated PVCs and APCs. She has chronic shortness of breath related to her COPD. She had an MRI in June 2011 which showed a 3.6 x 3.6 cm infrarenal aortic aneurysm. She has chronic back pain felt to be secondary lumbar disc bulging but not felt to be a surgical candidate. She also has esophageal strictures and had an esophageal dilatation in 2012. She has been on Simvastatin for years. Her lipids are followed by Dr. Margo Aye. She had chest pain in 2012 and I arranged a left heart cath on 07/09/11. This showed mild to moderate stable disease. She had a right lower lobectomy for stage IA non-small cell lung cancer per Dr. Edwyna Shell in October 2012. She did well with that procedure.   She is here today for follow up. She denies any chest pain or change in her breathing. She has been on Plavix per Dr. Juanda Chance for lifetime. Her ASA was stopped in 2013. She has continued to smoke, 3 cigarettes per day.   Primary Care Physician: Dr. Margo Aye   Last Lipid Profile: Per primary care.  November 2013 Total chol: 128   LDL: 63  HDL 31    Past Medical History  Diagnosis Date  . Coronary artery disease     post diaphragmatic wall infarction on 06-2007,due to stent thrombosis   . Chronic obstructive pulmonary disease   . Tobacco abuse   . Hypertension   . Hyperlipidemia   . Diabetes mellitus     with gastroparesis 70% retention at 2 hours  . GERD (gastroesophageal reflux disease)   . Anxiety disorder   . Depression   . Hypothyroidism   . Hx of hysterectomy   . AAA (abdominal aortic  aneurysm)   . Pulmonary nodule 04/2011    neg bx, followed by Dr. Edwyna Shell, may still be cancer, being followed with CTs.  . Esophageal dysmotility     Two BPE since 03/2011-->Moderate impairment of esophageal motility.Vallecular residuals. Laryngeal penetration  . Anemia     Past Surgical History  Procedure Laterality Date  . Cholecystectomy    . Cataract surg    . Bladder surgery      tack  . Kidney stone surgery    . Coronary stent placement  2004    2  . Coronary stent placement  2008    2  . S/p hysterectomy    . Neck surgery    . Esophagogastroduodenoscopy  5/08    normal esophagus, No H.Pylori  . Egd/bravo  07/2008    On Nexium BID. Day 1 DMSTR Score: 0.7, day 2: DMSTR score: 32, uncontrolled GERD, No H. Pylori  . Hbt  2009    negative  . Colonoscopy  05/2010    tortuous sigm colon, sigm tics, multiple simple adenomas, hemorrhoids, next TCS 10-15 yrs per Dr. Darrick Penna. Previous h/o tubular adenomas in 2008.   Marland Kitchen Esophagogastroduodenoscopy  05/2010    small hh, streaky erythema in body/antrum. Duodenal bx negative.   . Esophagogastroduodenoscopy  06/22/2011    mild gastritis/ring in the distal esophagus, savary dilation  .  Savory dilation  06/22/2011    Procedure: SAVORY DILATION;  Surgeon: Arlyce Harman, MD;  Location: [facility];  Service: Endoscopy;  Laterality: N/A;  [name] dilation  06/22/2011    Procedure: [name] DILATION;  Surgeon: Arlyce Harman, MD;  Location: [facility];  Service: Endoscopy;  Laterality: N/A;  . Video brochoscopy with electromagnetic navigaton  05/10/2011    [name]  . Right lower lobe superior segmentectomy  October 2012  . Hysterectomy at age 13      Current Outpatient Prescriptions  Medication Sig Dispense Refill  . albuterol (PROAIR HFA) 108 (90 BASE) MCG/ACT inhaler Inhale 2 puffs into the lungs every 6 (six) hours as needed.        . ALPRAZolam (XANAX) 0.5 MG tablet Take 0.5 mg by mouth 2 (two) times daily.        Marland Kitchen amLODipine  (NORVASC) 5 MG tablet TAKE 1 TABLET DAILY  30 tablet  5  . atorvastatin (LIPITOR) 20 MG tablet Take 20 mg by mouth daily.        . clopidogrel (PLAVIX) 75 MG tablet TAKE 1 TABLET DAILY  30 tablet  12  . ferrous sulfate 325 (65 FE) MG tablet Take 325 mg by mouth daily with breakfast.       . gabapentin (NEURONTIN) 300 MG capsule 300 mg 3 (three) times daily.       Marland Kitchen HYDROcodone-acetaminophen (LORTAB) 10-500 MG per tablet Take 1 tablet by mouth every 6 (six) hours as needed.       . hyoscyamine (LEVSIN SL) 0.125 MG SL tablet Place 0.125 mg under the tongue every 4 (four) hours as needed.        Marland Kitchen JANUVIA 50 MG tablet Take 50 mg by mouth daily.       Marland Kitchen levothyroxine (SYNTHROID, LEVOTHROID) 112 MCG tablet Take 112 mcg by mouth daily.       Marland Kitchen lubiprostone (AMITIZA) 24 MCG capsule Take 1 capsule (24 mcg total) by mouth 2 (two) times daily with a meal.  60 capsule  3  . metoCLOPramide (REGLAN) 5 MG tablet 1 PO QAC THREE TIMES A DAY  90 tablet  5  . pantoprazole (PROTONIX) 40 MG tablet TAKE  (1)  TABLET TWICE A DAY.  60 tablet  5  . ranitidine (ZANTAC) 150 MG tablet Take 75 mg by mouth daily.       . Vitamin D, Ergocalciferol, (DRISDOL) 50000 UNITS CAPS Take 50,000 Units by mouth once a week.         No current facility-administered medications for this visit.    Allergies  Allergen Reactions  . Sulfonamide Derivatives     History   Social History  . Marital Status: Widowed    Spouse Name: N/A    Number of Children: N/A  . Years of Education: N/A   Occupational History  . Not on file.   Social History Main Topics  . Smoking status: Current Every Day Smoker    Types: Cigarettes  . Smokeless tobacco: Not on file     Comment: She smokes 3 cigarretes daily  . Alcohol Use: No  . Drug Use: No  . Sexually Active: Not on file   Other Topics Concern  . Not on file   Social History Narrative  . No narrative on file    Family History  Problem Relation Age of Onset  . Coronary artery  disease      family hx of  . Colon cancer  Sister 56  . Colon cancer Daughter   . Prostate cancer      family hx of  . Lung cancer      family hx of  . Ovarian cancer      family hx of  . Arthritis      family hx of  . Other      family hx of chronic respiratory condition    Review of Systems:  As stated in the HPI and otherwise negative.   BP 111/62  Pulse 61  Ht 5\' 9"  (1.753 m)  Wt 197 lb (89.359 kg)  BMI 29.08 kg/m2  Physical Examination: General: Well developed, well nourished, NAD HEENT: OP clear, mucus membranes moist SKIN: warm, dry. No rashes. Neuro: No focal deficits Musculoskeletal: Muscle strength 5/5 all ext Psychiatric: Mood and affect normal Neck: No JVD, no carotid bruits, no thyromegaly, no lymphadenopathy. Lungs:Clear bilaterally, no wheezes, rhonci, crackles Cardiovascular: Regular rate and rhythm. No murmurs, gallops or rubs. Abdomen:Soft. Bowel sounds present. Non-tender.  Extremities: No lower extremity edema. Pulses are 2 + in the bilateral DP/PT.  Lexiscan Myoview 12/05/12: Stress Procedure: The patient received IV Lexiscan 0.4 mg over 15-seconds. Technetium 43m Sestamibi injected at 30-seconds. She did c/o chest tightness 9/10 with Lexiscan and was give Aminophylline 75 mg with some relief, 5/10. She was given a cup of coffee after Lexiscan and chest tightness was relieved completely prior to stress images. Quantitative spect images were obtained after a 45 minute delay.  Stress ECG: Uninteretable due to baseline LBBB  QPS  Raw Data Images: Acquisition technically good; normal left ventricular size.  Stress Images: There is decreased uptake in the septum and apex.  Rest Images: There is decreased uptake in the septum and apex.  Subtraction (SDS): No evidence of ischemia.  Transient Ischemic Dilatation (Normal <1.22): 0.89  Lung/Heart Ratio (Normal <0.45): 0.35  Quantitative Gated Spect Images  QGS EDV: 106 ml  QGS ESV: 45 ml  Impression    Exercise Capacity: Lexiscan with no exercise.  BP Response: Normal blood pressure response.  Clinical Symptoms: There is chest tightness.  ECG Impression: Baseline: LBBB. EKG uninterpretable due to LBBB at rest and stress.  Comparison with Prior Nuclear Study: No images to compare  Overall Impression: Low risk stress nuclear study with a large, moderate intensity, fixed septal and apical defect consistent with infarct vs LBBB; no significant ischemia.  LV Ejection Fraction: 58%. LV Wall Motion: NL LV Function; NL Wall Motion   Assessment and Plan:   1. CAD: Low risk stress myoview January 2014.  Most recent cath was in August 2012 and her disease was stable. She has continued to smoke. Lipids controlled. BP is well controlled. Continue Plavix and statin.   2. Tobacco abuse: She is cutting back. Smoking cessation encouraged. She wishes to stop.   3. AAA: Stable by u/s June 2013. Repeat u/s this June.   4. Carotid artery disease: Stable by dopplers April 2013. She is scheduled for repeat dopplers 03/20/13.

## 2013-03-11 ENCOUNTER — Emergency Department (HOSPITAL_COMMUNITY): Payer: Medicare Other

## 2013-03-11 ENCOUNTER — Emergency Department (HOSPITAL_COMMUNITY)
Admission: EM | Admit: 2013-03-11 | Discharge: 2013-03-11 | Disposition: A | Discharge status: Home / Self Care | Payer: Medicare Other | Attending: Emergency Medicine | Admitting: Emergency Medicine

## 2013-03-11 ENCOUNTER — Encounter (HOSPITAL_COMMUNITY): Payer: Self-pay | Admitting: Emergency Medicine

## 2013-03-11 ENCOUNTER — Other Ambulatory Visit: Payer: Self-pay

## 2013-03-11 DIAGNOSIS — Z8719 Personal history of other diseases of the digestive system: Secondary | ICD-10-CM | POA: Insufficient documentation

## 2013-03-11 DIAGNOSIS — R10816 Epigastric abdominal tenderness: Secondary | ICD-10-CM | POA: Insufficient documentation

## 2013-03-11 DIAGNOSIS — I10 Essential (primary) hypertension: Secondary | ICD-10-CM | POA: Insufficient documentation

## 2013-03-11 DIAGNOSIS — E119 Type 2 diabetes mellitus without complications: Secondary | ICD-10-CM | POA: Insufficient documentation

## 2013-03-11 DIAGNOSIS — J449 Chronic obstructive pulmonary disease, unspecified: Secondary | ICD-10-CM | POA: Insufficient documentation

## 2013-03-11 DIAGNOSIS — I2510 Atherosclerotic heart disease of native coronary artery without angina pectoris: Secondary | ICD-10-CM | POA: Insufficient documentation

## 2013-03-11 DIAGNOSIS — R911 Solitary pulmonary nodule: Secondary | ICD-10-CM | POA: Insufficient documentation

## 2013-03-11 DIAGNOSIS — R079 Chest pain, unspecified: Secondary | ICD-10-CM | POA: Insufficient documentation

## 2013-03-11 DIAGNOSIS — Z79899 Other long term (current) drug therapy: Secondary | ICD-10-CM | POA: Insufficient documentation

## 2013-03-11 DIAGNOSIS — F3289 Other specified depressive episodes: Secondary | ICD-10-CM | POA: Insufficient documentation

## 2013-03-11 DIAGNOSIS — F329 Major depressive disorder, single episode, unspecified: Secondary | ICD-10-CM | POA: Insufficient documentation

## 2013-03-11 DIAGNOSIS — K219 Gastro-esophageal reflux disease without esophagitis: Secondary | ICD-10-CM | POA: Insufficient documentation

## 2013-03-11 DIAGNOSIS — F17200 Nicotine dependence, unspecified, uncomplicated: Secondary | ICD-10-CM | POA: Insufficient documentation

## 2013-03-11 DIAGNOSIS — J4489 Other specified chronic obstructive pulmonary disease: Secondary | ICD-10-CM | POA: Insufficient documentation

## 2013-03-11 DIAGNOSIS — F411 Generalized anxiety disorder: Secondary | ICD-10-CM | POA: Insufficient documentation

## 2013-03-11 DIAGNOSIS — E039 Hypothyroidism, unspecified: Secondary | ICD-10-CM | POA: Insufficient documentation

## 2013-03-11 DIAGNOSIS — Z8679 Personal history of other diseases of the circulatory system: Secondary | ICD-10-CM | POA: Insufficient documentation

## 2013-03-11 DIAGNOSIS — E785 Hyperlipidemia, unspecified: Secondary | ICD-10-CM | POA: Insufficient documentation

## 2013-03-11 DIAGNOSIS — D649 Anemia, unspecified: Secondary | ICD-10-CM | POA: Insufficient documentation

## 2013-03-11 LAB — CBC WITH DIFFERENTIAL/PLATELET
Basophils Absolute: 0 10*3/uL (ref 0.0–0.1)
Basophils Relative: 0 % (ref 0–1)
Eosinophils Absolute: 0.2 10*3/uL (ref 0.0–0.7)
Eosinophils Relative: 2 % (ref 0–5)
HCT: 38.9 % (ref 36.0–46.0)
Hemoglobin: 13 g/dL (ref 12.0–15.0)
Lymphocytes Relative: 29 % (ref 12–46)
Lymphs Abs: 2.2 10*3/uL (ref 0.7–4.0)
MCH: 29.5 pg (ref 26.0–34.0)
MCHC: 33.4 g/dL (ref 30.0–36.0)
MCV: 88.2 fL (ref 78.0–100.0)
Monocytes Absolute: 0.6 10*3/uL (ref 0.1–1.0)
Monocytes Relative: 7 % (ref 3–12)
Neutro Abs: 4.7 10*3/uL (ref 1.7–7.7)
Neutrophils Relative %: 62 % (ref 43–77)
Platelets: 204 10*3/uL (ref 150–400)
RBC: 4.41 MIL/uL (ref 3.87–5.11)
RDW: 13.7 % (ref 11.5–15.5)
WBC: 7.5 10*3/uL (ref 4.0–10.5)

## 2013-03-11 LAB — BASIC METABOLIC PANEL
BUN: 12 mg/dL (ref 6–23)
CO2: 31 mEq/L (ref 19–32)
Calcium: 8.7 mg/dL (ref 8.4–10.5)
Chloride: 105 mEq/L (ref 96–112)
Creatinine, Ser: 1.06 mg/dL (ref 0.50–1.10)
GFR calc Af Amer: 55 mL/min — ABNORMAL LOW (ref >90)
GFR calc non Af Amer: 48 mL/min — ABNORMAL LOW (ref >90)
Glucose, Bld: 102 mg/dL — ABNORMAL HIGH (ref 70–99)
Potassium: 3.6 mEq/L (ref 3.5–5.1)
Sodium: 143 mEq/L (ref 135–145)

## 2013-03-11 LAB — URINALYSIS, ROUTINE W REFLEX MICROSCOPIC
Bilirubin Urine: NEGATIVE
Glucose, UA: NEGATIVE mg/dL
Hgb urine dipstick: NEGATIVE
Ketones, ur: NEGATIVE mg/dL
Leukocytes, UA: NEGATIVE
Nitrite: NEGATIVE
Protein, ur: NEGATIVE mg/dL
Specific Gravity, Urine: <1.005 — ABNORMAL LOW (ref 1.005–1.030)
Urobilinogen, UA: 1 mg/dL (ref 0.0–1.0)
pH: 6 (ref 5.0–8.0)

## 2013-03-11 LAB — TROPONIN I
Troponin I: <0.3 ng/mL (ref <0.30)
Troponin I: <0.3 ng/mL (ref <0.30)

## 2013-03-11 LAB — LIPASE, BLOOD: Lipase: 25 U/L (ref 11–59)

## 2013-03-11 MED ORDER — SODIUM CHLORIDE 0.9 % IV SOLN
INTRAVENOUS | Status: DC
  Administered 2013-03-11: 17:00:00 via INTRAVENOUS

## 2013-03-11 MED ORDER — MORPHINE SULFATE 4 MG/ML IJ SOLN
4.0000 mg | INTRAMUSCULAR | Status: DC | PRN
  Administered 2013-03-11: 4 mg via INTRAVENOUS
  Filled 2013-03-11: qty 1

## 2013-03-11 MED ORDER — NITROGLYCERIN 0.4 MG SL SUBL
0.4000 mg | SUBLINGUAL_TABLET | SUBLINGUAL | Status: DC | PRN
  Administered 2013-03-11: 0.4 mg via SUBLINGUAL
  Filled 2013-03-11: qty 25

## 2013-03-11 MED ORDER — IOHEXOL 350 MG/ML SOLN
100.0000 mL | Freq: Once | INTRAVENOUS | Status: AC | PRN
  Administered 2013-03-11: 100 mL via INTRAVENOUS

## 2013-03-11 MED ORDER — ONDANSETRON HCL 4 MG/2ML IJ SOLN
4.0000 mg | Freq: Once | INTRAMUSCULAR | Status: AC
  Administered 2013-03-11: 4 mg via INTRAVENOUS
  Filled 2013-03-11: qty 2

## 2013-03-11 NOTE — ED Provider Notes (Signed)
History    79 year old female with chest pain. Onset while at rest around noon today. Patient was just sitting on the edge of her bed when she first noticed it. Pain is in the lower sternal/epigastric region. It radiates along the bilateral costal margin, but little bit worse on the left side. Sharp in nature. "I hurt straight through to my spine." No numbness or tingling. Pain has waxed and waned since onset, but not completely resolved. Patient has a sensation of warmth all over. "Like I'm sitting in a tub of warm water." No shortness of breath. No fevers or chills. No nausea or vomiting. No urinary complaints. Past history significant for CAD.  She does have history of kidney stones. She is also status post cholecystectomy. Doesn't feel like pain she has ever had before. Known abdominal aortic aneurysm measuring 3.9 cm on last imaging 09/13.  CSN: [id number]  Arrival date & time 03/11/13  1522   First MD Initiated Contact with Patient 03/11/13 1532      Chief Complaint  Patient presents with  . Chest Pain    (Consider location/radiation/quality/duration/timing/severity/associated sxs/prior treatment) HPI  Past Medical History  Diagnosis Date  . Coronary artery disease     post diaphragmatic wall infarction on 06-2007,due to stent thrombosis   . Chronic obstructive pulmonary disease   . Tobacco abuse   . Hypertension   . Hyperlipidemia   . Diabetes mellitus     with gastroparesis 70% retention at 2 hours  . GERD (gastroesophageal reflux disease)   . Anxiety disorder   . Depression   . Hypothyroidism   . Hx of hysterectomy   . AAA (abdominal aortic aneurysm)   . Pulmonary nodule 04/2011    neg bx, followed by Dr. Edwyna Shell, may still be cancer, being followed with CTs.  . Esophageal dysmotility     Two BPE since 03/2011-->Moderate impairment of esophageal motility.Vallecular residuals. Laryngeal penetration  . Anemia     Past Surgical History  Procedure Laterality Date  .  Cholecystectomy    . Cataract surg    . Bladder surgery      tack  . Kidney stone surgery    . Coronary stent placement  2004    2  . Coronary stent placement  2008    2  . S/p hysterectomy    . Neck surgery    . Esophagogastroduodenoscopy  5/08    normal esophagus, No H.Pylori  . Egd/bravo  07/2008    On Nexium BID. Day 1 DMSTR Score: 0.7, day 2: DMSTR score: 32, uncontrolled GERD, No H. Pylori  . Hbt  2009    negative  . Colonoscopy  05/2010    tortuous sigm colon, sigm tics, multiple simple adenomas, hemorrhoids, next TCS 10-15 yrs per Dr. Darrick Penna. Previous h/o tubular adenomas in 2008.   Marland Kitchen Esophagogastroduodenoscopy  05/2010    small hh, streaky erythema in body/antrum. Duodenal bx negative.   . Esophagogastroduodenoscopy  06/22/2011    mild gastritis/ring in the distal esophagus, savary dilation  . Savory dilation  06/22/2011    Procedure: SAVORY DILATION;  Surgeon: Arlyce Harman, MD;  Location: [facility];  Service: Endoscopy;  Laterality: N/A;  [name] dilation  06/22/2011    Procedure: [name] DILATION;  Surgeon: Arlyce Harman, MD;  Location: [facility];  Service: Endoscopy;  Laterality: N/A;  . Video brochoscopy with electromagnetic navigaton  05/10/2011    [name]  . Right lower lobe superior segmentectomy  October 2012  .  Hysterectomy at age 5      Family History  Problem Relation Age of Onset  . Coronary artery disease      family hx of  . Colon cancer Sister 13  . Colon cancer Daughter   . Prostate cancer      family hx of  . Lung cancer      family hx of  . Ovarian cancer      family hx of  . Arthritis      family hx of  . Other      family hx of chronic respiratory condition    History  Substance Use Topics  . Smoking status: Current Every Day Smoker -- 0.50 packs/day    Types: Cigarettes  . Smokeless tobacco: Not on file     Comment: She smokes 3 cigarretes daily  . Alcohol Use: No    OB History   Grav Para Term Preterm Abortions  TAB SAB Ect Mult Living                  Review of Systems  All systems reviewed and negative, other than as noted in HPI.   Allergies  Sulfonamide derivatives  Home Medications   Current Outpatient Rx  Name  Route  Sig  Dispense  Refill  . albuterol (PROAIR HFA) 108 (90 BASE) MCG/ACT inhaler   Inhalation   Inhale 2 puffs into the lungs every 6 (six) hours as needed.           . ALPRAZolam (XANAX) 0.5 MG tablet   Oral   Take 0.5 mg by mouth 2 (two) times daily.           Marland Kitchen amLODipine (NORVASC) 5 MG tablet   Oral   Take 5 mg by mouth daily.         Marland Kitchen atorvastatin (LIPITOR) 20 MG tablet   Oral   Take 20 mg by mouth daily.           . clopidogrel (PLAVIX) 75 MG tablet   Oral   Take 75 mg by mouth daily.         . ferrous sulfate 325 (65 FE) MG tablet   Oral   Take 325 mg by mouth daily with breakfast.          . gabapentin (NEURONTIN) 300 MG capsule      300 mg 3 (three) times daily.          Marland Kitchen HYDROcodone-acetaminophen (LORTAB) 10-500 MG per tablet   Oral   Take 1 tablet by mouth every 6 (six) hours as needed.          . hyoscyamine (LEVSIN SL) 0.125 MG SL tablet   Sublingual   Place 0.125 mg under the tongue every 4 (four) hours as needed.           Marland Kitchen JANUVIA 50 MG tablet   Oral   Take 50 mg by mouth daily.          Marland Kitchen levothyroxine (SYNTHROID, LEVOTHROID) 112 MCG tablet   Oral   Take 112 mcg by mouth daily.          Marland Kitchen lubiprostone (AMITIZA) 24 MCG capsule   Oral   Take 1 capsule (24 mcg total) by mouth 2 (two) times daily with a meal.   60 capsule   3   . metoCLOPramide (REGLAN) 5 MG tablet   Oral   Take 5 mg by mouth 3 (three) times daily before meals.         Marland Kitchen  pantoprazole (PROTONIX) 40 MG tablet   Oral   Take 40 mg by mouth daily.         . ranitidine (ZANTAC) 150 MG tablet   Oral   Take 75 mg by mouth daily.          . Vitamin D, Ergocalciferol, (DRISDOL) 50000 UNITS CAPS   Oral   Take 50,000 Units by  mouth once a week.             BP 153/66  Pulse 67  Temp(Src) 98.2 F (36.8 C) (Oral)  Resp 20  SpO2 99%  Physical Exam  Nursing note and vitals reviewed. Constitutional: She appears well-developed and well-nourished. No distress.  HENT:  Head: Normocephalic and atraumatic.  Eyes: Conjunctivae are normal. Right eye exhibits no discharge. Left eye exhibits no discharge.  Neck: Neck supple.  Cardiovascular: Normal rate, regular rhythm and normal heart sounds.  Exam reveals no gallop and no friction rub.   No murmur heard. Pulmonary/Chest: Effort normal and breath sounds normal. No respiratory distress.  Abdominal: Soft. She exhibits no distension. There is tenderness.  Epigastric tenderness without rebound or guarding. No distention.  Musculoskeletal: She exhibits no edema and no tenderness.  Neurological: She is alert.  Skin: Skin is warm and dry.  Psychiatric: She has a normal mood and affect. Her behavior is normal. Thought content normal.    ED Course  Procedures (including critical care time)  Labs Reviewed  BASIC METABOLIC PANEL - Abnormal; Notable for the following:    Glucose, Bld 102 (*)    GFR calc non Af Amer 48 (*)    GFR calc Af Amer 55 (*)    All other components within normal limits  URINALYSIS, ROUTINE W REFLEX MICROSCOPIC - Abnormal; Notable for the following:    Specific Gravity, Urine <1.005 (*)    All other components within normal limits  CBC WITH DIFFERENTIAL  TROPONIN I  LIPASE, BLOOD  TROPONIN I   Dg Chest 2 View  03/11/2013  *RADIOLOGY REPORT*  Clinical Data: Left chest pain.  Cough.  Hypertension.  Diabetes.  CHEST - 2 VIEW  Comparison: 12/22/2012  Findings: Stable right perihilar scarring along the wedge resection site.  Atherosclerotic calcification noted in the aortic arch with mild tortuosity.  Heart size within normal limits.  There is a suggestion of mild scarring or subsegmental atelectasis medially in the right lower lobe.  IMPRESSION:   1.  Stable scarring in the right perihilar region and medially in the right lower lobe.  No new opacity identified.   Original Report Authenticated By: Gaylyn Rong, M.D.    EKG:  Rhythm: normal sinus Vent. rate 67 BPM PR interval 212 ms QRS duration 152 ms QT/QTc 480/507 ms LBBB ST segments: NS ST changes   1. Chest pain       MDM  79 year old female with lower chest/epigastric pain. Patient with known CAD. She had a low risk stress test 3 months ago. Last catheterization in August 2012 which showed stable disease at bedtime. Some concern for possible dissection with patient's description of a sharp pain going straight through to her back. Will CT. Also history diabetes with gastroparesis. This is a consideration. Patient describes symptoms today unlike symptoms she's had previously though. She is status post cholecystectomy. Really not typical of ureteral colic. Known CAD but symptoms atypical given constant duration since onset and reproducible epigastric tenderness. Will check lipase. EKG shows a left bundle branch block which is not new. We'll check a  troponin. If CT is unremarkable in terms of possible etiology, tentatively plan for repeat trop. Will tx symptomatically in mean time and continue to monitor.        Raeford Razor, MD 03/13/13 [id number]

## 2013-03-11 NOTE — ED Notes (Signed)
Per [facility], pt from home and had onset of CP at 1230.  Per EMS, pain starts at central chest and moves left to right and down to 6th rib.  Pt has Nitro at home, but did not take.  EMS administered 324 mg ASA, 2 Nitro, 4 mg Morphine.  Pt states pain decreased from 9/10 to 8/10.  Pt also has hx of GERD and AAA with a follow up appt scheduled for 05/06.  Pt states she feels like her abdomen is pulsating.  Pt alert and oriented.

## 2013-03-13 ENCOUNTER — Emergency Department (HOSPITAL_COMMUNITY): Payer: Medicare Other

## 2013-03-13 ENCOUNTER — Emergency Department (HOSPITAL_COMMUNITY)
Admission: EM | Admit: 2013-03-13 | Discharge: 2013-03-13 | Disposition: A | Discharge status: Home / Self Care | Payer: Medicare Other | Attending: Emergency Medicine | Admitting: Emergency Medicine

## 2013-03-13 ENCOUNTER — Encounter (HOSPITAL_COMMUNITY): Payer: Self-pay | Admitting: Emergency Medicine

## 2013-03-13 DIAGNOSIS — R112 Nausea with vomiting, unspecified: Secondary | ICD-10-CM | POA: Insufficient documentation

## 2013-03-13 DIAGNOSIS — J449 Chronic obstructive pulmonary disease, unspecified: Secondary | ICD-10-CM | POA: Insufficient documentation

## 2013-03-13 DIAGNOSIS — Z7902 Long term (current) use of antithrombotics/antiplatelets: Secondary | ICD-10-CM | POA: Insufficient documentation

## 2013-03-13 DIAGNOSIS — F17200 Nicotine dependence, unspecified, uncomplicated: Secondary | ICD-10-CM | POA: Insufficient documentation

## 2013-03-13 DIAGNOSIS — Z8719 Personal history of other diseases of the digestive system: Secondary | ICD-10-CM | POA: Insufficient documentation

## 2013-03-13 DIAGNOSIS — F411 Generalized anxiety disorder: Secondary | ICD-10-CM | POA: Insufficient documentation

## 2013-03-13 DIAGNOSIS — F329 Major depressive disorder, single episode, unspecified: Secondary | ICD-10-CM | POA: Insufficient documentation

## 2013-03-13 DIAGNOSIS — Z90710 Acquired absence of both cervix and uterus: Secondary | ICD-10-CM | POA: Insufficient documentation

## 2013-03-13 DIAGNOSIS — D649 Anemia, unspecified: Secondary | ICD-10-CM | POA: Insufficient documentation

## 2013-03-13 DIAGNOSIS — E785 Hyperlipidemia, unspecified: Secondary | ICD-10-CM | POA: Insufficient documentation

## 2013-03-13 DIAGNOSIS — J4489 Other specified chronic obstructive pulmonary disease: Secondary | ICD-10-CM | POA: Insufficient documentation

## 2013-03-13 DIAGNOSIS — Z8679 Personal history of other diseases of the circulatory system: Secondary | ICD-10-CM | POA: Insufficient documentation

## 2013-03-13 DIAGNOSIS — F3289 Other specified depressive episodes: Secondary | ICD-10-CM | POA: Insufficient documentation

## 2013-03-13 DIAGNOSIS — E039 Hypothyroidism, unspecified: Secondary | ICD-10-CM | POA: Insufficient documentation

## 2013-03-13 DIAGNOSIS — R197 Diarrhea, unspecified: Secondary | ICD-10-CM | POA: Insufficient documentation

## 2013-03-13 DIAGNOSIS — E119 Type 2 diabetes mellitus without complications: Secondary | ICD-10-CM | POA: Insufficient documentation

## 2013-03-13 DIAGNOSIS — I2510 Atherosclerotic heart disease of native coronary artery without angina pectoris: Secondary | ICD-10-CM | POA: Insufficient documentation

## 2013-03-13 DIAGNOSIS — Z9861 Coronary angioplasty status: Secondary | ICD-10-CM | POA: Insufficient documentation

## 2013-03-13 DIAGNOSIS — I10 Essential (primary) hypertension: Secondary | ICD-10-CM | POA: Insufficient documentation

## 2013-03-13 DIAGNOSIS — Z79899 Other long term (current) drug therapy: Secondary | ICD-10-CM | POA: Insufficient documentation

## 2013-03-13 DIAGNOSIS — R109 Unspecified abdominal pain: Secondary | ICD-10-CM | POA: Insufficient documentation

## 2013-03-13 LAB — CBC WITH DIFFERENTIAL/PLATELET
Hemoglobin: 15.8 g/dL — ABNORMAL HIGH (ref 12.0–15.0)
Lymphocytes Relative: 5 % — ABNORMAL LOW (ref 12–46)
Lymphs Abs: 0.7 10*3/uL (ref 0.7–4.0)
Monocytes Relative: 4 % (ref 3–12)
Neutrophils Relative %: 90 % — ABNORMAL HIGH (ref 43–77)
Platelets: 218 10*3/uL (ref 150–400)
RBC: 5.13 MIL/uL — ABNORMAL HIGH (ref 3.87–5.11)
WBC: 13.3 10*3/uL — ABNORMAL HIGH (ref 4.0–10.5)

## 2013-03-13 LAB — COMPREHENSIVE METABOLIC PANEL
ALT: 9 U/L (ref 0–35)
Albumin: 4.1 g/dL (ref 3.5–5.2)
Alkaline Phosphatase: 124 U/L — ABNORMAL HIGH (ref 39–117)
Calcium: 9.3 mg/dL (ref 8.4–10.5)
GFR calc Af Amer: 61 mL/min — ABNORMAL LOW (ref >90)
Glucose, Bld: 122 mg/dL — ABNORMAL HIGH (ref 70–99)
Potassium: 3.6 mEq/L (ref 3.5–5.1)
Sodium: 141 mEq/L (ref 135–145)
Total Protein: 7.7 g/dL (ref 6.0–8.3)

## 2013-03-13 LAB — URINE MICROSCOPIC-ADD ON

## 2013-03-13 LAB — URINALYSIS, ROUTINE W REFLEX MICROSCOPIC
Hgb urine dipstick: NEGATIVE
Nitrite: NEGATIVE
Specific Gravity, Urine: 1.025 (ref 1.005–1.030)
Urobilinogen, UA: 0.2 mg/dL (ref 0.0–1.0)
pH: 6 (ref 5.0–8.0)

## 2013-03-13 LAB — LIPASE, BLOOD: Lipase: 21 U/L (ref 11–59)

## 2013-03-13 MED ORDER — HYDROMORPHONE HCL PF 1 MG/ML IJ SOLN
1.0000 mg | Freq: Once | INTRAMUSCULAR | Status: AC
  Administered 2013-03-13: 1 mg via INTRAVENOUS
  Filled 2013-03-13: qty 1

## 2013-03-13 MED ORDER — ONDANSETRON HCL 4 MG/2ML IJ SOLN
4.0000 mg | Freq: Once | INTRAMUSCULAR | Status: AC
  Administered 2013-03-13: 4 mg via INTRAVENOUS
  Filled 2013-03-13: qty 2

## 2013-03-13 MED ORDER — IOHEXOL 300 MG/ML  SOLN
100.0000 mL | Freq: Once | INTRAMUSCULAR | Status: AC | PRN
  Administered 2013-03-13: 100 mL via INTRAVENOUS

## 2013-03-13 MED ORDER — SODIUM CHLORIDE 0.9 % IV SOLN
Freq: Once | INTRAVENOUS | Status: AC
  Administered 2013-03-13: 15:00:00 via INTRAVENOUS

## 2013-03-13 MED ORDER — HYDROCODONE-ACETAMINOPHEN 5-325 MG PO TABS: 1.0000 | ORAL_TABLET | ORAL | Status: DC | PRN

## 2013-03-13 MED ORDER — IOHEXOL 300 MG/ML  SOLN
50.0000 mL | Freq: Once | INTRAMUSCULAR | Status: AC | PRN
  Administered 2013-03-13: 50 mL via ORAL

## 2013-03-13 MED ORDER — ONDANSETRON 4 MG PO TBDP: 4.0000 mg | ORAL_TABLET | Freq: Three times a day (TID) | ORAL | Status: DC | PRN

## 2013-03-13 NOTE — ED Notes (Signed)
Pt c/o RLQ pain as well as N/V/D that began around 9am this morning. Pt was seen this past Sunday at [facility] for "cardiac problems" but denies any of those symptoms today.

## 2013-03-13 NOTE — ED Notes (Signed)
Patient complaining of returning right lower quadrant pain. Advised MD.

## 2013-03-13 NOTE — ED Provider Notes (Signed)
History  This chart was scribed for Carleene Cooper III, MD by Shari Heritage, ED Scribe. The patient was seen in room APA19/APA19. Patient's care was started at 1254.   CSN: [id number]  Arrival date & time 03/13/13  1254     Chief Complaint  Patient presents with  . Abdominal Pain  . Emesis   Patient is a 79 y.o. female presenting with abdominal pain. The history is provided by the patient. No language interpreter was used.  Abdominal Pain Pain location: lower. Pain radiates to:  Does not radiate Pain severity now: moderate to severe. Onset quality:  Gradual Duration:  5 hours Timing:  Constant Progression:  Unchanged Chronicity:  New Associated symptoms: diarrhea, nausea and vomiting   Associated symptoms: no chest pain, no cough, no fever and no shortness of breath   Diarrhea:    Quality:  Watery   Duration:  5 hours   Timing:  Constant   Progression:  Unchanged Nausea:    Duration:  5 hours   Timing:  Constant Vomiting:    Quality:  Stomach contents   Duration:  5 hours   Timing:  Constant Risk factors: being elderly     HPI Comments: Madison Henson is a 79 y.o. female with history of coronary artery disease and coronary stents who presents to the Emergency Department complaining of constant, severe, non-radiating, gradual lower abdominal pain with nausea, vomiting and diarrhea onset 9 am this morning. She denies urinary symptoms, fever, chills cough, headache, chest pain, shortness of breath or any other symptoms at this time. Patient was evaluated at [facility] on 03/11/2013 after complaining of chest pain. Per medical records, EKG showed a left bundle branch block that was unchanged from a prior EKG. Patient symptoms were treated and she was discharged after negative CT and Patient is allergic to sulfa. She smokes abouts 5 cigarettes per day. Patient denies alcohol use.    Past Medical History  Diagnosis Date  . Coronary artery disease     post diaphragmatic wall  infarction on 06-2007,due to stent thrombosis   . Chronic obstructive pulmonary disease   . Tobacco abuse   . Hypertension   . Hyperlipidemia   . Diabetes mellitus     with gastroparesis 70% retention at 2 hours  . GERD (gastroesophageal reflux disease)   . Anxiety disorder   . Depression   . Hypothyroidism   . Hx of hysterectomy   . AAA (abdominal aortic aneurysm)   . Pulmonary nodule 04/2011    neg bx, followed by Dr. Edwyna Shell, may still be cancer, being followed with CTs.  . Esophageal dysmotility     Two BPE since 03/2011-->Moderate impairment of esophageal motility.Vallecular residuals. Laryngeal penetration  . Anemia     Past Surgical History  Procedure Laterality Date  . Cholecystectomy    . Cataract surg    . Bladder surgery      tack  . Kidney stone surgery    . Coronary stent placement  2004    2  . Coronary stent placement  2008    2  . S/p hysterectomy    . Neck surgery    . Esophagogastroduodenoscopy  5/08    normal esophagus, No H.Pylori  . Egd/bravo  07/2008    On Nexium BID. Day 1 DMSTR Score: 0.7, day 2: DMSTR score: 32, uncontrolled GERD, No H. Pylori  . Hbt  2009    negative  . Colonoscopy  05/2010    tortuous sigm colon, sigm tics,  multiple simple adenomas, hemorrhoids, next TCS 10-15 yrs per Dr. Darrick Penna. Previous h/o tubular adenomas in 2008.   Marland Kitchen Esophagogastroduodenoscopy  05/2010    small hh, streaky erythema in body/antrum. Duodenal bx negative.   . Esophagogastroduodenoscopy  06/22/2011    mild gastritis/ring in the distal esophagus, savary dilation  . Savory dilation  06/22/2011    Procedure: SAVORY DILATION;  Surgeon: Arlyce Harman, MD;  Location: [facility];  Service: Endoscopy;  Laterality: N/A;  [name] dilation  06/22/2011    Procedure: [name] DILATION;  Surgeon: Arlyce Harman, MD;  Location: [facility];  Service: Endoscopy;  Laterality: N/A;  . Video brochoscopy with electromagnetic navigaton  05/10/2011    [name]  . Right  lower lobe superior segmentectomy  October 2012  . Hysterectomy at age 37      Family History  Problem Relation Age of Onset  . Coronary artery disease      family hx of  . Colon cancer Sister 88  . Colon cancer Daughter   . Prostate cancer      family hx of  . Lung cancer      family hx of  . Ovarian cancer      family hx of  . Arthritis      family hx of  . Other      family hx of chronic respiratory condition    History  Substance Use Topics  . Smoking status: Current Every Day Smoker -- 0.50 packs/day    Types: Cigarettes  . Smokeless tobacco: Not on file     Comment: She smokes 3 cigarretes daily  . Alcohol Use: No    OB History   Grav Para Term Preterm Abortions TAB SAB Ect Mult Living                  Review of Systems  Constitutional: Negative for fever.  Respiratory: Negative for cough and shortness of breath.   Cardiovascular: Negative for chest pain and palpitations.  Gastrointestinal: Positive for nausea, vomiting, abdominal pain and diarrhea.  Genitourinary: Negative.   Skin: Negative for rash.  All other systems reviewed and are negative.    Allergies  Sulfonamide derivatives  Home Medications   Current Outpatient Rx  Name  Route  Sig  Dispense  Refill  . albuterol (PROAIR HFA) 108 (90 BASE) MCG/ACT inhaler   Inhalation   Inhale 2 puffs into the lungs every 6 (six) hours as needed.           . ALPRAZolam (XANAX) 0.5 MG tablet   Oral   Take 0.5 mg by mouth 2 (two) times daily.           Marland Kitchen amLODipine (NORVASC) 5 MG tablet   Oral   Take 5 mg by mouth daily.         Marland Kitchen atorvastatin (LIPITOR) 20 MG tablet   Oral   Take 20 mg by mouth daily.           . clopidogrel (PLAVIX) 75 MG tablet   Oral   Take 75 mg by mouth daily.         . ferrous sulfate 325 (65 FE) MG tablet   Oral   Take 325 mg by mouth daily with breakfast.          . gabapentin (NEURONTIN) 300 MG capsule      300 mg 3 (three) times daily.          Marland Kitchen  hyoscyamine (LEVSIN SL) 0.125 MG SL tablet   Sublingual   Place 0.125 mg under the tongue every 4 (four) hours as needed.           Marland Kitchen JANUVIA 50 MG tablet   Oral   Take 50 mg by mouth daily.          Marland Kitchen levothyroxine (SYNTHROID, LEVOTHROID) 112 MCG tablet   Oral   Take 112 mcg by mouth daily.          Marland Kitchen lubiprostone (AMITIZA) 24 MCG capsule   Oral   Take 1 capsule (24 mcg total) by mouth 2 (two) times daily with a meal.   60 capsule   3   . metoCLOPramide (REGLAN) 5 MG tablet   Oral   Take 5 mg by mouth 3 (three) times daily before meals.         . pantoprazole (PROTONIX) 40 MG tablet   Oral   Take 40 mg by mouth daily.         . ranitidine (ZANTAC) 150 MG tablet   Oral   Take 75 mg by mouth daily.          . Vitamin D, Ergocalciferol, (DRISDOL) 50000 UNITS CAPS   Oral   Take 50,000 Units by mouth once a week.             Triage Vitals: BP 149/61  Pulse 94  Temp(Src) 98.4 F (36.9 C) (Oral)  SpO2 95%  Physical Exam  Constitutional: She is oriented to person, place, and time. She appears well-developed and well-nourished.  HENT:  Head: Normocephalic and atraumatic.  Edentulous. Tongue is dry.  Eyes: Conjunctivae and EOM are normal. Pupils are equal, round, and reactive to light.  Neck: Normal range of motion. Neck supple.  Cardiovascular: Normal rate and regular rhythm.   Pulmonary/Chest: Effort normal and breath sounds normal.  Abdominal: Soft. She exhibits distension (midlly). There is tenderness. There is no rebound and no guarding.  Diffuse bilateral lower abdominal tenderness.  Musculoskeletal: Normal range of motion. She exhibits no edema.  Neurological: She is alert and oriented to person, place, and time.  Neurologically intact.   Skin: Skin is warm and dry.    ED Course  Procedures (including critical care time) DIAGNOSTIC STUDIES: Oxygen Saturation is 95% on room air, adequate by my interpretation.    COORDINATION OF CARE: 2:13  PM- Patient informed of current plan for treatment and evaluation and agrees with plan at this time.      Labs Reviewed - No data to display Dg Chest 2 View  03/11/2013  *RADIOLOGY REPORT*  Clinical Data: Left chest pain.  Cough.  Hypertension.  Diabetes.  CHEST - 2 VIEW  Comparison: 12/22/2012  Findings: Stable right perihilar scarring along the wedge resection site.  Atherosclerotic calcification noted in the aortic arch with mild tortuosity.  Heart size within normal limits.  There is a suggestion of mild scarring or subsegmental atelectasis medially in the right lower lobe.  IMPRESSION:  1.  Stable scarring in the right perihilar region and medially in the right lower lobe.  No new opacity identified.   Original Report Authenticated By: Gaylyn Rong, M.D.    Ct Angio Chest W/cm &/or Wo Cm  03/11/2013  *RADIOLOGY REPORT*  Clinical Data:  Chest pain.  Aortic dissection.  CT ANGIOGRAPHY CHEST, ABDOMEN AND PELVIS  Technique:  Multidetector CT imaging through the chest, abdomen and pelvis was performed using the standard protocol during bolus administration of intravenous contrast.  Multiplanar reconstructed images including MIPs were obtained and reviewed to evaluate the vascular anatomy.  Contrast: OMNIPAQUE IOHEXOL 350 MG/ML SOLN  Comparison:  07/31/2012.CT chest 12/22/2012.  CTA CHEST  Findings:  There are no noncontrast findings of intramural hematoma or acute aortic abnormality.  Severe aortic and coronary artery atherosclerosis is present.  Small amount of anterior pericardial fluid or thickening is present.  Postcontrast imaging shows no aortic dissection.  Small area of herniation of lung in the right lateral chest likely secondary to prior thoracotomy.  Wedge resection of the right lower lobe.  Comparing to the prior CT, the linear scarring along the staple line in the right lower lobe appears similar.  Posterior pleural thickening is present. There is no pleural effusion.  Negative for  pulmonary embolism. No adenopathy.  Right upper lobe pulmonary nodules appears similar to prior.  No airspace disease.  No aggressive osseous lesions identified.   Review of the MIP images confirms the above findings.  IMPRESSION:  1.  Negative for aortic dissection or acute aortic abnormality. 2.  Atherosclerosis and coronary artery disease. 3.  Wedge resection of the right lower lobe with no interval change compared to recent chest CT.  CTA ABDOMEN AND PELVIS  Findings:  The infrarenal abdominal aortic aneurysm today measures 36 mm x 40 mm.  This is comparable to the prior exam 07/31/2012. There are no acute periaortic findings to suggest an acute vascular abnormality.  Kidneys appear unchanged with small left cyst and left renal atrophy, likely secondary to renovascular disease.  Old granulomatous disease of the liver.  Spleen appears within normal limits.  Cholecystectomy.  Post cholecystectomy dilation of the common bile duct.  Bowel appears within normal limits.  Urinary bladder normal.  Hysterectomy.  Colonic diverticulosis without diverticulitis.  Urinary bladder is mostly decompressed.  No intra- abdominal free air. No aggressive osseous lesions.   Review of the MIP images confirms the above findings.  IMPRESSION:  1.  Infrarenal abdominal aortic aneurysm measuring 36 mm x 40 mm. No acute findings.  Negative for dissection. 2.  No acute intra-abdominal abnormality. 3.  Unchanged left renal atrophy, likely renovascular. 4.  Cholecystectomy and hysterectomy.   Original Report Authenticated By: Andreas Newport, M.D.    Ct Angio Abd/pel W/ And/or W/o  03/11/2013  *RADIOLOGY REPORT*  Clinical Data:  Chest pain.  Aortic dissection.  CT ANGIOGRAPHY CHEST, ABDOMEN AND PELVIS  Technique:  Multidetector CT imaging through the chest, abdomen and pelvis was performed using the standard protocol during bolus administration of intravenous contrast.  Multiplanar reconstructed images including MIPs were obtained and  reviewed to evaluate the vascular anatomy.  Contrast: OMNIPAQUE IOHEXOL 350 MG/ML SOLN  Comparison:  07/31/2012.CT chest 12/22/2012.  CTA CHEST  Findings:  There are no noncontrast findings of intramural hematoma or acute aortic abnormality.  Severe aortic and coronary artery atherosclerosis is present.  Small amount of anterior pericardial fluid or thickening is present.  Postcontrast imaging shows no aortic dissection.  Small area of herniation of lung in the right lateral chest likely secondary to prior thoracotomy.  Wedge resection of the right lower lobe.  Comparing to the prior CT, the linear scarring along the staple line in the right lower lobe appears similar.  Posterior pleural thickening is present. There is no pleural effusion.  Negative for pulmonary embolism. No adenopathy.  Right upper lobe pulmonary nodules appears similar to prior.  No airspace disease.  No aggressive osseous lesions identified.   Review of the MIP  images confirms the above findings.  IMPRESSION:  1.  Negative for aortic dissection or acute aortic abnormality. 2.  Atherosclerosis and coronary artery disease. 3.  Wedge resection of the right lower lobe with no interval change compared to recent chest CT.  CTA ABDOMEN AND PELVIS  Findings:  The infrarenal abdominal aortic aneurysm today measures 36 mm x 40 mm.  This is comparable to the prior exam 07/31/2012. There are no acute periaortic findings to suggest an acute vascular abnormality.  Kidneys appear unchanged with small left cyst and left renal atrophy, likely secondary to renovascular disease.  Old granulomatous disease of the liver.  Spleen appears within normal limits.  Cholecystectomy.  Post cholecystectomy dilation of the common bile duct.  Bowel appears within normal limits.  Urinary bladder normal.  Hysterectomy.  Colonic diverticulosis without diverticulitis.  Urinary bladder is mostly decompressed.  No intra- abdominal free air. No aggressive osseous lesions.    Review of the MIP images confirms the above findings.  IMPRESSION:  1.  Infrarenal abdominal aortic aneurysm measuring 36 mm x 40 mm. No acute findings.  Negative for dissection. 2.  No acute intra-abdominal abnormality. 3.  Unchanged left renal atrophy, likely renovascular. 4.  Cholecystectomy and hysterectomy.   Original Report Authenticated By: Andreas Newport, M.D.    4:40 PM CBC shows WBC 13,300 with 90% neutrophils.  No UA yet.  Continues with lower abdominal pain; will medicate with IV pain and nausea meds.  Reviewed CT angio of abdomen and pelvis done 2 days ago, which showed an aortic aneurysm which was unchanged in size since 07/31/2012.  She has had prior cholecystectomy and hysterectomy.  She has colonic diverticulosis. Will await completion of her lab workup and x-rays.   8:28 PM Results for orders placed during the hospital encounter of 03/13/13  COMPREHENSIVE METABOLIC PANEL      Result Value Range   Sodium 141  135 - 145 mEq/L   Potassium 3.6  3.5 - 5.1 mEq/L   Chloride 100  96 - 112 mEq/L   CO2 30  19 - 32 mEq/L   Glucose, Bld 122 (*) 70 - 99 mg/dL   BUN 13  6 - 23 mg/dL   Creatinine, Ser 3.08  0.50 - 1.10 mg/dL   Calcium 9.3  8.4 - 65.7 mg/dL   Total Protein 7.7  6.0 - 8.3 g/dL   Albumin 4.1  3.5 - 5.2 g/dL   AST 15  0 - 37 U/L   ALT 9  0 - 35 U/L   Alkaline Phosphatase 124 (*) 39 - 117 U/L   Total Bilirubin 0.4  0.3 - 1.2 mg/dL   GFR calc non Af Amer 52 (*) >90 mL/min   GFR calc Af Amer 61 (*) >90 mL/min  LIPASE, BLOOD      Result Value Range   Lipase 21  11 - 59 U/L  URINALYSIS, ROUTINE W REFLEX MICROSCOPIC      Result Value Range   Color, Urine YELLOW  YELLOW   APPearance HAZY (*) CLEAR   Specific Gravity, Urine 1.025  1.005 - 1.030   pH 6.0  5.0 - 8.0   Glucose, UA NEGATIVE  NEGATIVE mg/dL   Hgb urine dipstick NEGATIVE  NEGATIVE   Bilirubin Urine NEGATIVE  NEGATIVE   Ketones, ur NEGATIVE  NEGATIVE mg/dL   Protein, ur 30 (*) NEGATIVE mg/dL   Urobilinogen, UA  0.2  0.0 - 1.0 mg/dL   Nitrite NEGATIVE  NEGATIVE   Leukocytes, UA NEGATIVE  NEGATIVE  CBC WITH DIFFERENTIAL      Result Value Range   WBC 13.3 (*) 4.0 - 10.5 K/uL   RBC 5.13 (*) 3.87 - 5.11 MIL/uL   Hemoglobin 15.8 (*) 12.0 - 15.0 g/dL   HCT 16.1 (*) 09.6 - 04.5 %   MCV 91.4  78.0 - 100.0 fL   MCH 30.8  26.0 - 34.0 pg   MCHC 33.7  30.0 - 36.0 g/dL   RDW 40.9  81.1 - 91.4 %   Platelets 218  150 - 400 K/uL   Neutrophils Relative 90 (*) 43 - 77 %   Neutro Abs 12.0 (*) 1.7 - 7.7 K/uL   Lymphocytes Relative 5 (*) 12 - 46 %   Lymphs Abs 0.7  0.7 - 4.0 K/uL   Monocytes Relative 4  3 - 12 %   Monocytes Absolute 0.5  0.1 - 1.0 K/uL   Eosinophils Relative 1  0 - 5 %   Eosinophils Absolute 0.1  0.0 - 0.7 K/uL   Basophils Relative 0  0 - 1 %   Basophils Absolute 0.0  0.0 - 0.1 K/uL  URINE MICROSCOPIC-ADD ON      Result Value Range   Squamous Epithelial / LPF FEW (*) RARE   WBC, UA 3-6  <3 WBC/hpf   RBC / HPF 0-2  <3 RBC/hpf   Bacteria, UA FEW (*) RARE   Dg Chest 2 View  03/11/2013  *RADIOLOGY REPORT*  Clinical Data: Left chest pain.  Cough.  Hypertension.  Diabetes.  CHEST - 2 VIEW  Comparison: 12/22/2012  Findings: Stable right perihilar scarring along the wedge resection site.  Atherosclerotic calcification noted in the aortic arch with mild tortuosity.  Heart size within normal limits.  There is a suggestion of mild scarring or subsegmental atelectasis medially in the right lower lobe.  IMPRESSION:  1.  Stable scarring in the right perihilar region and medially in the right lower lobe.  No new opacity identified.   Original Report Authenticated By: Gaylyn Rong, M.D.    Ct Angio Chest W/cm &/or Wo Cm  03/11/2013  *RADIOLOGY REPORT*  Clinical Data:  Chest pain.  Aortic dissection.  CT ANGIOGRAPHY CHEST, ABDOMEN AND PELVIS  Technique:  Multidetector CT imaging through the chest, abdomen and pelvis was performed using the standard protocol during bolus administration of intravenous  contrast.  Multiplanar reconstructed images including MIPs were obtained and reviewed to evaluate the vascular anatomy.  Contrast: OMNIPAQUE IOHEXOL 350 MG/ML SOLN  Comparison:  07/31/2012.CT chest 12/22/2012.  CTA CHEST  Findings:  There are no noncontrast findings of intramural hematoma or acute aortic abnormality.  Severe aortic and coronary artery atherosclerosis is present.  Small amount of anterior pericardial fluid or thickening is present.  Postcontrast imaging shows no aortic dissection.  Small area of herniation of lung in the right lateral chest likely secondary to prior thoracotomy.  Wedge resection of the right lower lobe.  Comparing to the prior CT, the linear scarring along the staple line in the right lower lobe appears similar.  Posterior pleural thickening is present. There is no pleural effusion.  Negative for pulmonary embolism. No adenopathy.  Right upper lobe pulmonary nodules appears similar to prior.  No airspace disease.  No aggressive osseous lesions identified.   Review of the MIP images confirms the above findings.  IMPRESSION:  1.  Negative for aortic dissection or acute aortic abnormality. 2.  Atherosclerosis and coronary artery disease. 3.  Wedge resection of the right lower  lobe with no interval change compared to recent chest CT.  CTA ABDOMEN AND PELVIS  Findings:  The infrarenal abdominal aortic aneurysm today measures 36 mm x 40 mm.  This is comparable to the prior exam 07/31/2012. There are no acute periaortic findings to suggest an acute vascular abnormality.  Kidneys appear unchanged with small left cyst and left renal atrophy, likely secondary to renovascular disease.  Old granulomatous disease of the liver.  Spleen appears within normal limits.  Cholecystectomy.  Post cholecystectomy dilation of the common bile duct.  Bowel appears within normal limits.  Urinary bladder normal.  Hysterectomy.  Colonic diverticulosis without diverticulitis.  Urinary bladder is mostly  decompressed.  No intra- abdominal free air. No aggressive osseous lesions.   Review of the MIP images confirms the above findings.  IMPRESSION:  1.  Infrarenal abdominal aortic aneurysm measuring 36 mm x 40 mm. No acute findings.  Negative for dissection. 2.  No acute intra-abdominal abnormality. 3.  Unchanged left renal atrophy, likely renovascular. 4.  Cholecystectomy and hysterectomy.   Original Report Authenticated By: Andreas Newport, M.D.    Ct Abdomen Pelvis W Contrast  03/13/2013  *RADIOLOGY REPORT*  Clinical Data: Lower abdomen pelvic pain.  Leukocytosis.  Previous surgery for lung cancer.  CT ABDOMEN AND PELVIS WITH CONTRAST  Technique:  Multidetector CT imaging of the abdomen and pelvis was performed following the standard protocol during bolus administration of intravenous contrast.  Contrast: 50mL OMNIPAQUE IOHEXOL 300 MG/ML  SOLN, OMNIPAQUE IOHEXOL 300 MG/ML  SOLN  Comparison: Radiographs obtained earlier today.  Previous CT examinations, the most recent dated 03/11/2013.  Findings: Stable linear density at the right lung base compatible with chronic postsurgical scarring.  Mild diffuse low density of the liver relative to the spleen.  The right lobe of the liver is somewhat small and the lateral segment of the left lobe and caudate lobe are mildly enlarged.  Normal sized spleen, measuring 10.4 cm in length.  Small left renal cysts.  Mild cortical scarring in the lower pole of the left kidney.  Small right renal cyst.  Cholecystectomy clips.  Diffuse pancreatic atrophy with partial fatty replacement.  Previously described infrarenal abdominal aortic aneurysm.  This measures 4.0 cm in maximum diameter on image number 36.  This contains mural thrombus and patent lumen.  Unremarkable adrenal glands and urinary bladder.  Surgically absent uterus.  Normal appearing ovaries.  Multiple sigmoid colon diverticula without evidence of diverticulitis.  Normal appearing appendix.  No enlarged lymph nodes.   No gastric or small bowel abnormalities. Bilateral hip degenerative changes.  Lumbar and lower thoracic spine degenerative changes.  IMPRESSION:  1.  No acute abnormality. 2.  Stable 4.0 cm infrarenal abdominal aortic aneurysm. 3.  Stable left renal atrophy and cortical scarring. 4.  Mild hepatic steatosis. 5.  Diffuse pancreatic atrophy with partial fatty replacement. 6.  Imaging findings suggesting early changes of cirrhosis of the liver.  However, the patient's liver function tests are normal.   Original Report Authenticated By: Beckie Salts, M.D.    Dg Abd Acute W/chest  03/13/2013  *RADIOLOGY REPORT*  Clinical Data: Abdominal pain, nausea, vomiting and diarrhea.  ACUTE ABDOMEN SERIES (ABDOMEN 2 VIEW & CHEST 1 VIEW)  Comparison: Previous examinations.  Findings: Normal sized heart.  Clear lungs.  Stable right hemithorax postsurgical changes.  The lungs remain hyperexpanded with mildly prominent interstitial markings.  Normal bowel gas pattern without free peritoneal air. Cholecystectomy clips.  Mild to moderate dextroconvex lumbar rotary scoliosis.  IMPRESSION:  No acute abnormality.  Stable changes of COPD.   Original Report Authenticated By: Beckie Salts, M.D.    Ct Angio Abd/pel W/ And/or W/o  03/11/2013  *RADIOLOGY REPORT*  Clinical Data:  Chest pain.  Aortic dissection.  CT ANGIOGRAPHY CHEST, ABDOMEN AND PELVIS  Technique:  Multidetector CT imaging through the chest, abdomen and pelvis was performed using the standard protocol during bolus administration of intravenous contrast.  Multiplanar reconstructed images including MIPs were obtained and reviewed to evaluate the vascular anatomy.  Contrast: OMNIPAQUE IOHEXOL 350 MG/ML SOLN  Comparison:  07/31/2012.CT chest 12/22/2012.  CTA CHEST  Findings:  There are no noncontrast findings of intramural hematoma or acute aortic abnormality.  Severe aortic and coronary artery atherosclerosis is present.  Small amount of anterior pericardial fluid or  thickening is present.  Postcontrast imaging shows no aortic dissection.  Small area of herniation of lung in the right lateral chest likely secondary to prior thoracotomy.  Wedge resection of the right lower lobe.  Comparing to the prior CT, the linear scarring along the staple line in the right lower lobe appears similar.  Posterior pleural thickening is present. There is no pleural effusion.  Negative for pulmonary embolism. No adenopathy.  Right upper lobe pulmonary nodules appears similar to prior.  No airspace disease.  No aggressive osseous lesions identified.   Review of the MIP images confirms the above findings.  IMPRESSION:  1.  Negative for aortic dissection or acute aortic abnormality. 2.  Atherosclerosis and coronary artery disease. 3.  Wedge resection of the right lower lobe with no interval change compared to recent chest CT.  CTA ABDOMEN AND PELVIS  Findings:  The infrarenal abdominal aortic aneurysm today measures 36 mm x 40 mm.  This is comparable to the prior exam 07/31/2012. There are no acute periaortic findings to suggest an acute vascular abnormality.  Kidneys appear unchanged with small left cyst and left renal atrophy, likely secondary to renovascular disease.  Old granulomatous disease of the liver.  Spleen appears within normal limits.  Cholecystectomy.  Post cholecystectomy dilation of the common bile duct.  Bowel appears within normal limits.  Urinary bladder normal.  Hysterectomy.  Colonic diverticulosis without diverticulitis.  Urinary bladder is mostly decompressed.  No intra- abdominal free air. No aggressive osseous lesions.   Review of the MIP images confirms the above findings.  IMPRESSION:  1.  Infrarenal abdominal aortic aneurysm measuring 36 mm x 40 mm. No acute findings.  Negative for dissection. 2.  No acute intra-abdominal abnormality. 3.  Unchanged left renal atrophy, likely renovascular. 4.  Cholecystectomy and hysterectomy.   Original Report Authenticated By: Andreas Newport, M.D.     Lab workup and abdominal CT showed no serious cause of abdominal pain.  Will release with Rx for Zofran for nausea and hydrocodone-acetaminophen for pain.  Advised followup with [name], M.D., her internist.  1. Abdominal pain     I personally performed the services described in this documentation, which was scribed in my presence. The recorded information has been reviewed and is accurate.  Osvaldo Human, MD     Carleene Cooper III, MD 03/13/13 [id number]

## 2013-03-15 LAB — URINE CULTURE

## 2013-03-20 ENCOUNTER — Encounter (INDEPENDENT_AMBULATORY_CARE_PROVIDER_SITE_OTHER): Payer: Medicare Other

## 2013-03-20 DIAGNOSIS — I6529 Occlusion and stenosis of unspecified carotid artery: Secondary | ICD-10-CM

## 2013-03-23 ENCOUNTER — Telehealth: Payer: Self-pay | Admitting: Cardiovascular Disease

## 2013-03-23 NOTE — Telephone Encounter (Signed)
New Problem:    Patient called in wanting to know the results of her latest Carotid doppler.  Please call back.

## 2013-03-23 NOTE — Telephone Encounter (Signed)
Spoke with pt and reviewed doppler results with her.

## 2013-04-20 ENCOUNTER — Other Ambulatory Visit: Payer: Self-pay | Admitting: Cardiovascular Disease

## 2013-05-19 ENCOUNTER — Other Ambulatory Visit: Payer: Self-pay | Admitting: Gastroenterology

## 2013-05-29 IMAGING — CR DG CHEST 2V
2 series · 2 of 2 positions shown · non-contrast
Comparison: 04/30/2011 and earlier.

CLINICAL DATA: 82-year-old female with lung mass, productive cough.

CHEST - 2 VIEW

[view not recorded (1 of 2)]
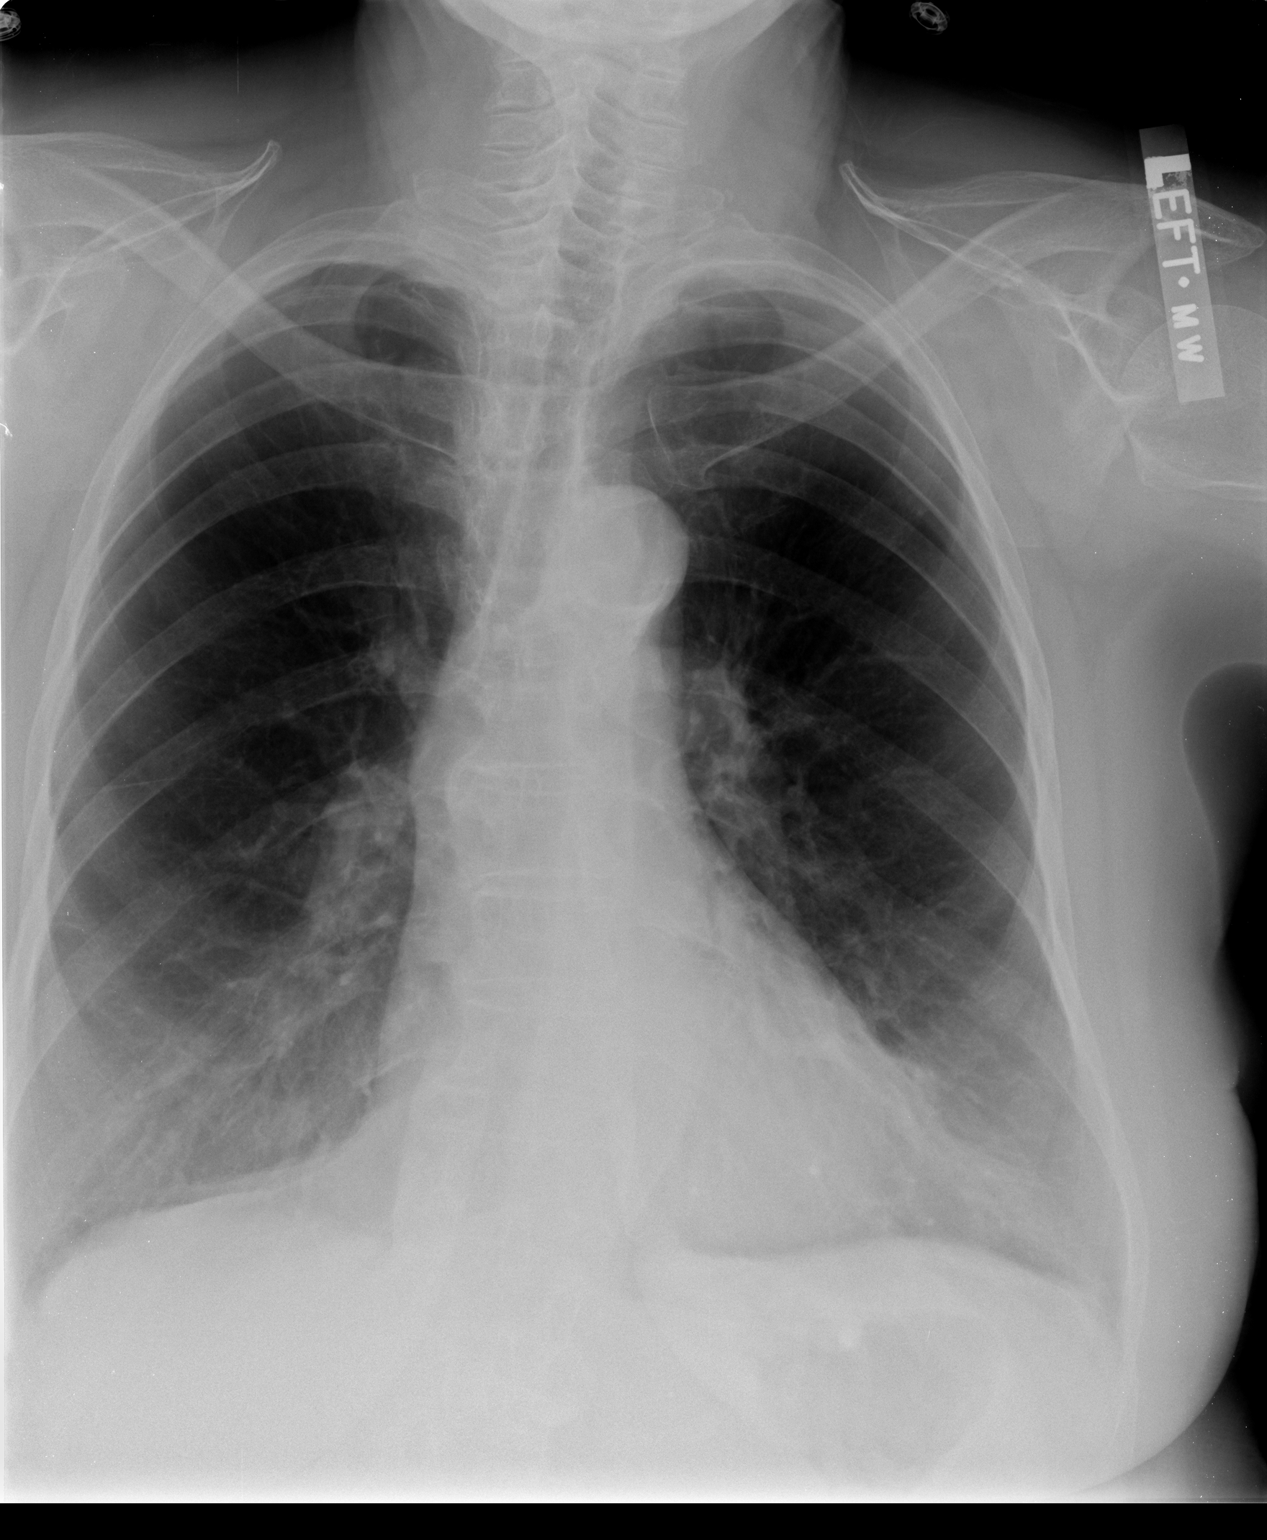

[view not recorded (2 of 2)]
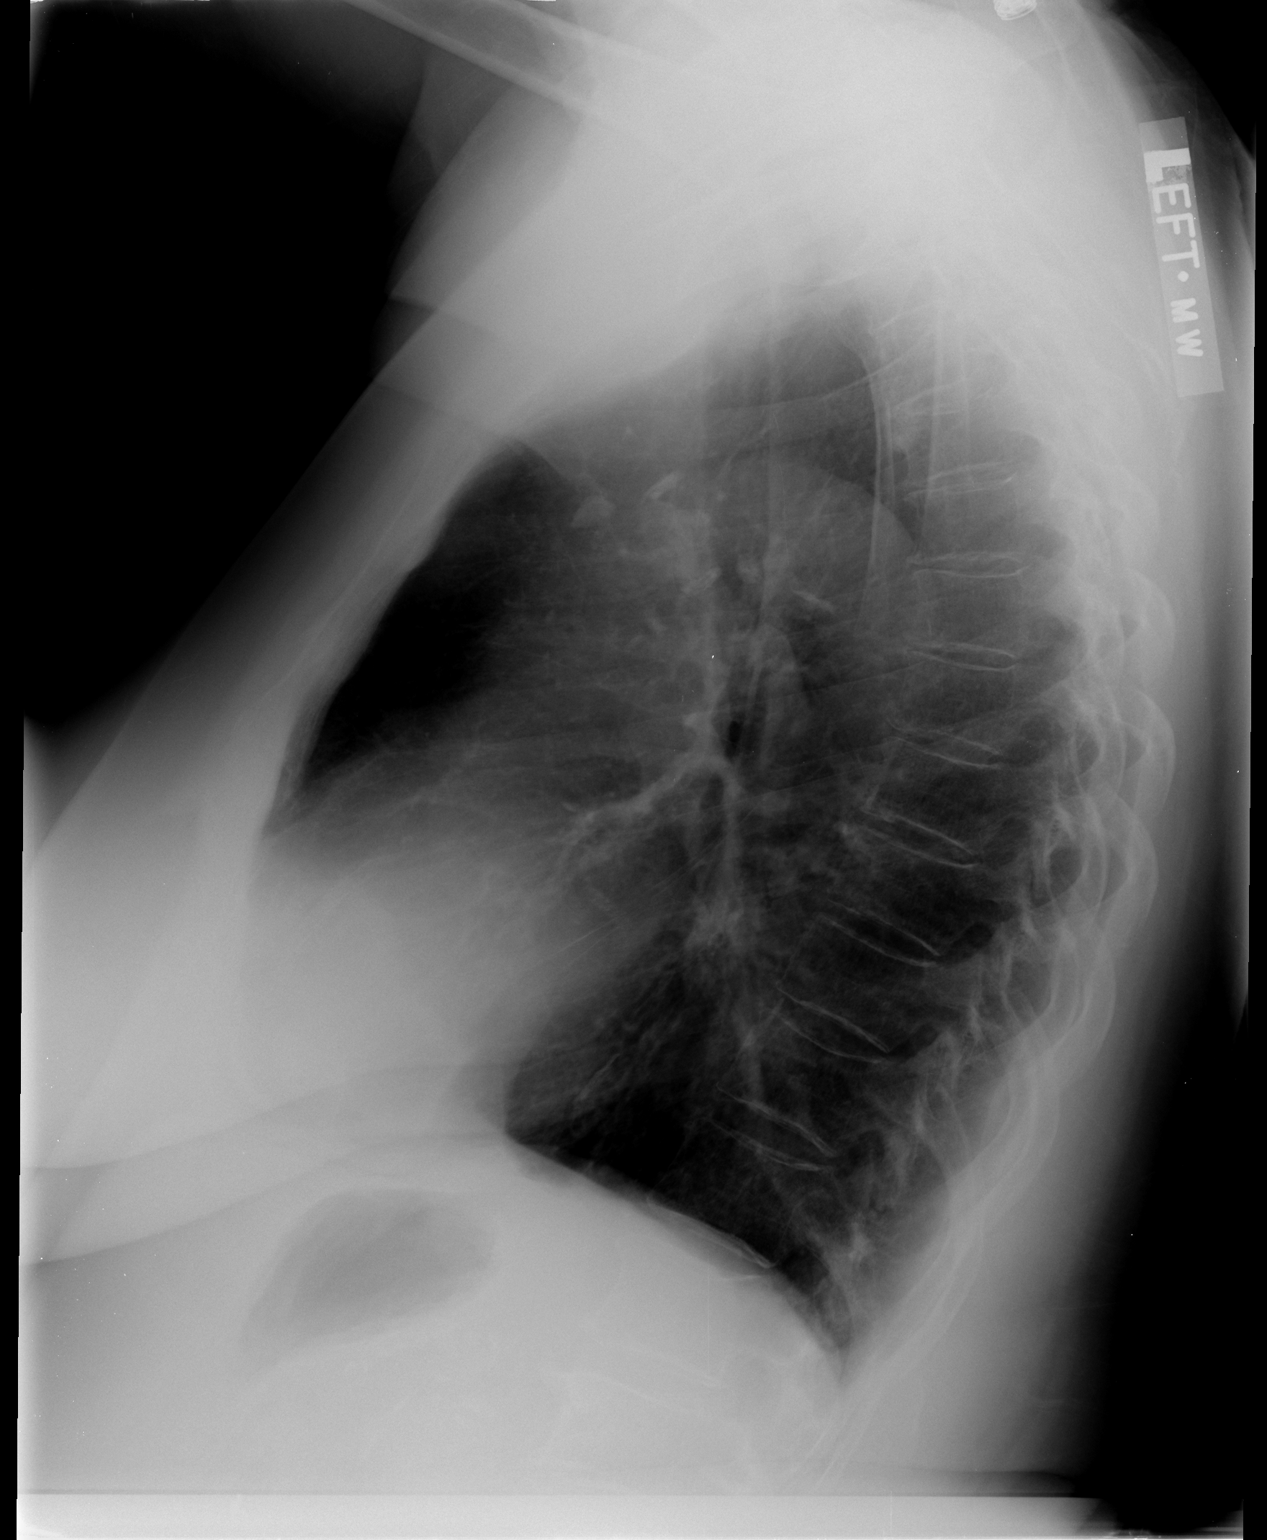

[2 of 2 positions shown; findings below may reference images not displayed]

FINDINGS: Spiculated right lower lobe lung mass seen on recent CT
is poorly visualized with plain radiographic technique.

Stable large lung volumes.  No pneumothorax, pulmonary edema,
pleural effusion or new pulmonary opacity.   Cardiac size and
mediastinal contours are within normal limits.  No acute osseous
abnormality identified.
IMPRESSION: 1.  Known right lower lobe spiculated mass poorly visualized with
plain radiographic technique.  See recent chest CT.
2. No new cardiopulmonary abnormality.

## 2013-06-08 ENCOUNTER — Encounter: Payer: Self-pay | Admitting: Gastroenterology

## 2013-07-04 ENCOUNTER — Ambulatory Visit (INDEPENDENT_AMBULATORY_CARE_PROVIDER_SITE_OTHER): Payer: Medicare Other | Admitting: Gastroenterology

## 2013-07-04 ENCOUNTER — Encounter: Payer: Self-pay | Admitting: Gastroenterology

## 2013-07-04 VITALS — BP 104/57 | HR 66 | Temp 98.4°F | Ht 69.0 in | Wt 193.0 lb

## 2013-07-04 DIAGNOSIS — R1310 Dysphagia, unspecified: Secondary | ICD-10-CM

## 2013-07-04 DIAGNOSIS — R1314 Dysphagia, pharyngoesophageal phase: Secondary | ICD-10-CM

## 2013-07-04 DIAGNOSIS — K5900 Constipation, unspecified: Secondary | ICD-10-CM

## 2013-07-04 DIAGNOSIS — K219 Gastro-esophageal reflux disease without esophagitis: Secondary | ICD-10-CM

## 2013-07-04 MED ORDER — LUBIPROSTONE 24 MCG PO CAPS: 24.0000 ug | ORAL_CAPSULE | Freq: Two times a day (BID) | ORAL | Status: DC

## 2013-07-04 NOTE — Patient Instructions (Addendum)
1. I will discuss your swallowing difficulties with Dr. Darrick Penna and let you know what the next step is. 2. Once we receive a list from your pharmacy we will make recommendations for constipation.

## 2013-07-04 NOTE — Progress Notes (Signed)
Primary Care Physician:  Madison Pizza, MD  Primary Gastroenterologist:  Madison Eva, MD   Chief Complaint  Patient presents with  . Dysphagia    HPI:  Madison Henson is a 79 y.o. female here last seen in October 2013. History of dysphagia secondary to esophageal dysmotility, last EGD in August 2012 with savory dilation. History of GERD, gastroparesis, epigastric pain, constipation. Hydrogen breath test in the past was negative for SBBO. CTA performed due to weight loss, concern for mesenteric ischemia. She had one vessel disease, no indicators for mesenteric ischemia. Also left renal artery stenosis noted she was referred to Madison Henson. Known abdominal aortic aneurysm followed by cardiology.  No teeth. Takes six or seven swallows to get pills down. Takes about two at a time.  Feels like food sticks in lower chest. After meals, epigastric cramping. Does not matter what she eats. Avoiding meals. Weight down 7 pounds since 08/2012. Takes miralax every morning but still passing very little stool. Tiny caliber. Has to strain. C/o abdominal bloating. Liquids has to swallow little at a time. Feels like trapped air.   She sleeps at 45 degree angle. Still wakes up with choking and regurgitation.   Current Outpatient Prescriptions  Medication Sig Dispense Refill  . albuterol (PROAIR HFA) 108 (90 BASE) MCG/ACT inhaler Inhale 2 puffs into the lungs every 6 (six) hours as needed.        . ALPRAZolam (XANAX) 0.5 MG tablet Take 0.5 mg by mouth 2 (two) times daily.        Marland Kitchen amLODipine (NORVASC) 5 MG tablet TAKE 1 TABLET DAILY  30 tablet  3  . atorvastatin (LIPITOR) 20 MG tablet Take 20 mg by mouth daily.        . clopidogrel (PLAVIX) 75 MG tablet Take 75 mg by mouth daily.      . ferrous sulfate 325 (65 FE) MG tablet Take 325 mg by mouth daily with breakfast.       . gabapentin (NEURONTIN) 300 MG capsule 300 mg 3 (three) times daily.       Marland Kitchen HYDROcodone-acetaminophen (NORCO/VICODIN) 5-325 MG per tablet  Take 1 tablet by mouth every 4 (four) hours as needed for pain.  20 tablet  0  . JANUVIA 50 MG tablet Take 50 mg by mouth daily.       Marland Kitchen levothyroxine (SYNTHROID, LEVOTHROID) 112 MCG tablet Take 112 mcg by mouth daily.       . pantoprazole (PROTONIX) 40 MG tablet TAKE  (1)  TABLET TWICE A DAY.  60 tablet  3  . ranitidine (ZANTAC) 150 MG tablet Take 75 mg by mouth daily.       . Vitamin D, Ergocalciferol, (DRISDOL) 50000 UNITS CAPS Take 50,000 Units by mouth once a week.         No current facility-administered medications for this visit.    Allergies as of 07/04/2013 - Review Complete 07/04/2013  Allergen Reaction Noted  . Sulfonamide derivatives      Past Medical History  Diagnosis Date  . Coronary artery disease     post diaphragmatic wall infarction on 06-2007,due to stent thrombosis   . Chronic obstructive pulmonary disease   . Tobacco abuse   . Hypertension   . Hyperlipidemia   . GERD (gastroesophageal reflux disease)   . Anxiety disorder   . Depression   . Hypothyroidism   . Hx of hysterectomy   . AAA (abdominal aortic aneurysm)   . Pulmonary nodule 04/2011    neg  bx, followed by Dr. Edwyna Henson, may still be cancer, being followed with CTs.  . Esophageal dysmotility     Two BPE since 03/2011-->Moderate impairment of esophageal motility.Vallecular residuals. Laryngeal penetration  . Anemia   . Diabetes mellitus     with gastroparesis 70% retention at 2 hours    Past Surgical History  Procedure Laterality Date  . Cholecystectomy    . Cataract surg    . Bladder surgery      tack  . Kidney stone surgery    . Coronary stent placement  2004    2  . Coronary stent placement  2008    2  . S/p hysterectomy    . Neck surgery    . Esophagogastroduodenoscopy  5/08    normal esophagus, No H.Pylori  . Egd/bravo  07/2008    On Nexium BID. Day 1 DMSTR Score: 0.7, day 2: DMSTR score: 32, uncontrolled GERD, No H. Pylori  . Hbt  2009    negative  . Colonoscopy  05/2010    tortuous  sigm colon, sigm tics, multiple simple adenomas, hemorrhoids, next TCS 10-15 yrs per Dr. Darrick Henson. Previous h/o tubular adenomas in 2008.   Marland Kitchen Esophagogastroduodenoscopy  05/2010    small hh, streaky erythema in body/antrum. Duodenal bx negative.   . Esophagogastroduodenoscopy  06/22/2011    mild gastritis/ring in the distal esophagus, savary dilation. Bx reactive gastropathy, No H.pylori  . Savory dilation  06/22/2011    Procedure: SAVORY DILATION;  Surgeon: Madison Harman, MD;  Location: [facility];  Service: Endoscopy;  Laterality: N/A;  Madison Henson dilation  06/22/2011    Procedure: Madison Henson DILATION;  Surgeon: Madison Harman, MD;  Location: [facility];  Service: Endoscopy;  Laterality: N/A;  . Video brochoscopy with electromagnetic navigaton  05/10/2011    Madison Henson (Negative)  . Right lower lobe superior segmentectomy  October 2012  . Hysterectomy at age 72      Family History  Problem Relation Age of Onset  . Coronary artery disease      family hx of  . Colon cancer Sister 62  . Colon cancer Daughter   . Prostate cancer      family hx of  . Lung cancer      family hx of  . Ovarian cancer      family hx of  . Arthritis      family hx of  . Other      family hx of chronic respiratory condition    History   Social History  . Marital Status: Widowed    Spouse Name: N/A    Number of Children: N/A  . Years of Education: N/A   Occupational History  . Not on file.   Social History Main Topics  . Smoking status: Current Every Day Smoker -- 0.50 packs/day    Types: Cigarettes  . Smokeless tobacco: Not on file     Comment: She smokes 3 cigarretes daily  . Alcohol Use: No  . Drug Use: No  . Sexual Activity: Not on file   Other Topics Concern  . Not on file   Social History Narrative  . No narrative on file      ROS:  General: Negative for anorexia,  fever, chills, fatigue, weakness. See hpi. Eyes: Negative for vision changes.  ENT: Negative for hoarseness, nasal  congestion. See hpi. CV: Negative for chest pain, angina, palpitations, dyspnea on exertion, peripheral edema.  Respiratory: Negative for dyspnea at rest, dyspnea on  exertion, cough, sputum, wheezing.  GI: See history of present illness. GU:  Negative for dysuria, hematuria, urinary incontinence, urinary frequency, nocturnal urination.  MS: Negative for joint pain, low back pain.  Derm: Negative for rash or itching.  Neuro: Negative for weakness, abnormal sensation, seizure, frequent headaches, memory loss, confusion.  Psych: Negative for anxiety, depression, suicidal ideation, hallucinations.  Endo: Negative for unusual weight change.  Heme: Negative for bruising or bleeding. Allergy: Negative for rash or hives.    Physical Examination:  BP 104/57  Pulse 66  Temp(Src) 98.4 F (36.9 C) (Oral)  Ht 5\' 9"  (1.753 m)  Wt 193 lb (87.544 kg)  BMI 28.49 kg/m2   General: Well-nourished, well-developed in no acute distress.  Head: Normocephalic, atraumatic.   Eyes: Conjunctiva pink, no icterus. Mouth: Oropharyngeal mucosa moist and pink , no lesions erythema or exudate. edentulous. Neck: Supple without thyromegaly, masses, or lymphadenopathy.  Lungs: Clear to auscultation bilaterally.  Heart: Regular rate and rhythm, no murmurs rubs or gallops.  Abdomen: Bowel sounds are normal, nontender, nondistended, no hepatosplenomegaly or masses, no abdominal bruits or    hernia , no rebound or guarding.   Rectal: not performed Extremities: No lower extremity edema. No clubbing or deformities.  Neuro: Alert and oriented x 4 , grossly normal neurologically.  Skin: Warm and dry, no rash or jaundice.   Psych: Alert and cooperative, normal mood and affect.  Labs: Labs from 05/03/2013. Sodium 147, potassium 4.4, glucose 91, BUN 8, creatinine 1, total bilirubin 0.6, alkaline phosphatase 98, AST 11, ALT 8, albumin 4.1, calcium 9.4, white blood cell count 7.4, hemoglobin 15.1, hematocrit 45.8, MCV 90.2.  Platelets 258,000 TSH 1.643, hemoglobin A1c 5.6   Imaging Studies: No results found.

## 2013-07-05 ENCOUNTER — Telehealth: Payer: Self-pay | Admitting: *Deleted

## 2013-07-05 NOTE — Telephone Encounter (Signed)
Routing to LL 

## 2013-07-05 NOTE — Telephone Encounter (Signed)
Pt called, stated that Madison Henson was going to return her call after talking with Dr. Darrick Penna about her stomach. Please advise [phone]

## 2013-07-06 ENCOUNTER — Encounter: Payer: Self-pay | Admitting: Gastroenterology

## 2013-07-06 NOTE — Progress Notes (Signed)
Please let patient know I sent RX for constipation to her pharmacy. Amitiza BID with food. I am still waiting to hear from [facility] regarding possible EGD/ED.

## 2013-07-06 NOTE — Assessment & Plan Note (Signed)
Add Amitiza 24mcg BID with food.  

## 2013-07-06 NOTE — Assessment & Plan Note (Signed)
Difficult situation. She has known esophageal motility disorder and had distal esophageal ring at time of last EGD in 2012. To discuss further with Dr. Darrick Penna regarding possible EGD/ED.   Continue antireflux measures. Continue PPI.

## 2013-07-09 ENCOUNTER — Emergency Department (HOSPITAL_COMMUNITY): Payer: Medicare Other

## 2013-07-09 ENCOUNTER — Other Ambulatory Visit: Payer: Self-pay | Admitting: Gastroenterology

## 2013-07-09 ENCOUNTER — Encounter (HOSPITAL_COMMUNITY): Payer: Self-pay

## 2013-07-09 ENCOUNTER — Observation Stay (HOSPITAL_COMMUNITY)
Admission: EM | Admit: 2013-07-09 | Discharge: 2013-07-10 | Disposition: A | Discharge status: Home / Self Care | Payer: Medicare Other | Attending: Family Medicine | Admitting: Family Medicine

## 2013-07-09 DIAGNOSIS — R1310 Dysphagia, unspecified: Secondary | ICD-10-CM

## 2013-07-09 DIAGNOSIS — R1319 Other dysphagia: Secondary | ICD-10-CM

## 2013-07-09 DIAGNOSIS — I714 Abdominal aortic aneurysm, without rupture, unspecified: Secondary | ICD-10-CM

## 2013-07-09 DIAGNOSIS — K219 Gastro-esophageal reflux disease without esophagitis: Secondary | ICD-10-CM

## 2013-07-09 DIAGNOSIS — E785 Hyperlipidemia, unspecified: Secondary | ICD-10-CM

## 2013-07-09 DIAGNOSIS — R079 Chest pain, unspecified: Principal | ICD-10-CM

## 2013-07-09 DIAGNOSIS — R109 Unspecified abdominal pain: Secondary | ICD-10-CM

## 2013-07-09 DIAGNOSIS — R1013 Epigastric pain: Secondary | ICD-10-CM

## 2013-07-09 DIAGNOSIS — I2510 Atherosclerotic heart disease of native coronary artery without angina pectoris: Secondary | ICD-10-CM

## 2013-07-09 DIAGNOSIS — I10 Essential (primary) hypertension: Secondary | ICD-10-CM

## 2013-07-09 DIAGNOSIS — Z79899 Other long term (current) drug therapy: Secondary | ICD-10-CM | POA: Insufficient documentation

## 2013-07-09 HISTORY — DX: Heart failure, unspecified: I50.9

## 2013-07-09 HISTORY — DX: Acute myocardial infarction, unspecified: I21.9

## 2013-07-09 HISTORY — DX: Malignant neoplasm of unspecified part of unspecified bronchus or lung: C34.90

## 2013-07-09 HISTORY — DX: Malignant (primary) neoplasm, unspecified: C80.1

## 2013-07-09 LAB — CBC WITH DIFFERENTIAL/PLATELET
Lymphocytes Relative: 26 % (ref 12–46)
Lymphs Abs: 2 10*3/uL (ref 0.7–4.0)
Neutro Abs: 4.8 10*3/uL (ref 1.7–7.7)
Neutrophils Relative %: 64 % (ref 43–77)
Platelets: 206 10*3/uL (ref 150–400)
RBC: 4.37 MIL/uL (ref 3.87–5.11)
WBC: 7.5 10*3/uL (ref 4.0–10.5)

## 2013-07-09 LAB — URINE MICROSCOPIC-ADD ON

## 2013-07-09 LAB — URINALYSIS, ROUTINE W REFLEX MICROSCOPIC
Glucose, UA: NEGATIVE mg/dL
Hgb urine dipstick: NEGATIVE
Ketones, ur: 15 mg/dL — AB
Protein, ur: NEGATIVE mg/dL
Urobilinogen, UA: 1 mg/dL (ref 0.0–1.0)

## 2013-07-09 LAB — D-DIMER, QUANTITATIVE: D-Dimer, Quant: 0.79 ug/mL-FEU — ABNORMAL HIGH (ref 0.00–0.48)

## 2013-07-09 LAB — COMPREHENSIVE METABOLIC PANEL
ALT: 6 U/L (ref 0–35)
AST: 9 U/L (ref 0–37)
CO2: 28 mEq/L (ref 19–32)
Calcium: 8.8 mg/dL (ref 8.4–10.5)
Chloride: 105 mEq/L (ref 96–112)
Creatinine, Ser: 0.95 mg/dL (ref 0.50–1.10)
GFR calc Af Amer: 63 mL/min — ABNORMAL LOW (ref >90)
GFR calc non Af Amer: 54 mL/min — ABNORMAL LOW (ref >90)
Glucose, Bld: 113 mg/dL — ABNORMAL HIGH (ref 70–99)
Sodium: 142 mEq/L (ref 135–145)
Total Bilirubin: 0.3 mg/dL (ref 0.3–1.2)

## 2013-07-09 MED ORDER — IOHEXOL 350 MG/ML SOLN
100.0000 mL | Freq: Once | INTRAVENOUS | Status: AC | PRN
  Administered 2013-07-09: 100 mL via INTRAVENOUS

## 2013-07-09 MED ORDER — NITROGLYCERIN 0.4 MG SL SUBL: 0.4000 mg | SUBLINGUAL_TABLET | SUBLINGUAL | Status: DC | PRN

## 2013-07-09 MED ORDER — MORPHINE SULFATE 4 MG/ML IJ SOLN
4.0000 mg | Freq: Once | INTRAMUSCULAR | Status: AC
  Administered 2013-07-09: 4 mg via INTRAVENOUS
  Filled 2013-07-09: qty 1

## 2013-07-09 NOTE — Progress Notes (Signed)
Called and informed pt. She said the Amitiza would cost her $300.00 and she cannot afford that. ( Per [place], [place] is pending on the Amitiza).

## 2013-07-09 NOTE — Telephone Encounter (Signed)
See addendum on 07/04/2013 visit.

## 2013-07-09 NOTE — Progress Notes (Signed)
Per [facility], please schedule BPE for this week. Administer barium pill.

## 2013-07-09 NOTE — Progress Notes (Signed)
PT HAS KNOWN ESO MOTILITY DISORDER. BPE THIS WEEK.  REVIEWED.

## 2013-07-09 NOTE — ED Notes (Signed)
Per [facility], pt from home for epigastric pain and right mid sternal pain. Pain increases with palpation and deep breathes. Pt took NTG x 1 and 324 mg ASA at home prior to EMS arrival. The pain has been sharp and intermittent. VSS.

## 2013-07-09 NOTE — ED Notes (Signed)
Madison Henson: [phone]

## 2013-07-09 NOTE — Progress Notes (Signed)
Patient is scheduled for BPE on Friday Aug 29th at 10:30 and patients daughter is aware

## 2013-07-09 NOTE — Progress Notes (Signed)
Called and informed pt. She said try to get it for Friday because she has to let [facility] know several days in advance. She is aware that Raynelle Fanning is waiting on PA from insurance co on the [place].

## 2013-07-09 NOTE — Progress Notes (Signed)
CC'd to PCP 

## 2013-07-09 NOTE — ED Notes (Signed)
Dr. Wolfe at the bedside.  

## 2013-07-09 NOTE — ED Provider Notes (Signed)
CSN: [id number]     Arrival date & time 07/09/13  1639 History   First MD Initiated Contact with Patient 07/09/13 1645     Chief Complaint  Patient presents with  . Abdominal Pain   (Consider location/radiation/quality/duration/timing/severity/associated sxs/prior Treatment) Patient is a 79 y.o. female presenting with abdominal pain. The history is provided by the patient.  Abdominal Pain Pain location:  Epigastric Pain quality: sharp   Pain radiates to:  Does not radiate Pain severity:  Moderate Onset quality:  Gradual Duration:  2 hours Timing:  Constant Progression:  Unchanged Chronicity:  New Relieved by:  Nothing Worsened by:  Nothing tried Ineffective treatments: ASA, NTG. Associated symptoms: chest pain   Associated symptoms: no chills, no cough, no diarrhea, no dysuria, no fatigue, no fever, no nausea, no shortness of breath and no vomiting   Chest pain:    Quality:  Sharp   Severity:  Moderate   Onset quality:  Gradual   Duration:  2 hours   Timing:  Constant   Progression:  Unchanged   Chronicity:  New   Past Medical History  Diagnosis Date  . Coronary artery disease     post diaphragmatic wall infarction on 06-2007,due to stent thrombosis   . Chronic obstructive pulmonary disease   . Tobacco abuse   . Hypertension   . Hyperlipidemia   . GERD (gastroesophageal reflux disease)   . Anxiety disorder   . Depression   . Hypothyroidism   . Hx of hysterectomy   . AAA (abdominal aortic aneurysm)   . Pulmonary nodule 04/2011    neg bx, followed by Dr. Edwyna Shell, may still be cancer, being followed with CTs.  . Esophageal dysmotility     Two BPE since 03/2011-->Moderate impairment of esophageal motility.Vallecular residuals. Laryngeal penetration  . Anemia   . Diabetes mellitus     with gastroparesis 70% retention at 2 hours  . Cancer   . Lung cancer   . CHF (congestive heart failure)    Past Surgical History  Procedure Laterality Date  . Cholecystectomy    .  Cataract surg    . Bladder surgery      tack  . Kidney stone surgery    . Coronary stent placement  2004    2  . Coronary stent placement  2008    2  . S/p hysterectomy    . Neck surgery    . Esophagogastroduodenoscopy  5/08    normal esophagus, No H.Pylori  . Egd/bravo  07/2008    On Nexium BID. Day 1 DMSTR Score: 0.7, day 2: DMSTR score: 32, uncontrolled GERD, No H. Pylori  . Hbt  2009    negative  . Colonoscopy  05/2010    tortuous sigm colon, sigm tics, multiple simple adenomas, hemorrhoids, next TCS 10-15 yrs per Dr. Darrick Penna. Previous h/o tubular adenomas in 2008.   Marland Kitchen Esophagogastroduodenoscopy  05/2010    small hh, streaky erythema in body/antrum. Duodenal bx negative.   . Esophagogastroduodenoscopy  06/22/2011    mild gastritis/ring in the distal esophagus, savary dilation. Bx reactive gastropathy, No H.pylori  . Savory dilation  06/22/2011    Procedure: SAVORY DILATION;  Surgeon: Arlyce Harman, MD;  Location: [facility];  Service: Endoscopy;  Laterality: N/A;  [name] dilation  06/22/2011    Procedure: [name] DILATION;  Surgeon: Arlyce Harman, MD;  Location: [facility];  Service: Endoscopy;  Laterality: N/A;  . Video brochoscopy with electromagnetic navigaton  05/10/2011  [name]  . Right lower lobe superior segmentectomy  October 2012  . Hysterectomy at age 74     Family History  Problem Relation Age of Onset  . Coronary artery disease      family hx of  . Colon cancer Sister 17  . Colon cancer Daughter   . Prostate cancer      family hx of  . Lung cancer      family hx of  . Ovarian cancer      family hx of  . Arthritis      family hx of  . Other      family hx of chronic respiratory condition   History  Substance Use Topics  . Smoking status: Current Every Day Smoker -- 0.50 packs/day    Types: Cigarettes  . Smokeless tobacco: Not on file     Comment: She smokes 3 cigarretes daily  . Alcohol Use: No   OB History   Grav Para Term Preterm  Abortions TAB SAB Ect Mult Living                 Review of Systems  Constitutional: Negative for fever, chills, diaphoresis, appetite change and fatigue.  Respiratory: Negative for cough, chest tightness, shortness of breath and wheezing.   Cardiovascular: Positive for chest pain. Negative for palpitations and leg swelling.  Gastrointestinal: Positive for abdominal pain. Negative for nausea, vomiting, diarrhea, blood in stool and abdominal distention.  Genitourinary: Negative for dysuria, urgency, flank pain, decreased urine volume and difficulty urinating.  Musculoskeletal: Negative for back pain.  Neurological: Negative for dizziness, syncope, weakness, light-headedness, numbness and headaches.  All other systems reviewed and are negative.    Allergies  Sulfonamide derivatives  Home Medications   Current Outpatient Rx  Name  Route  Sig  Dispense  Refill  . albuterol (PROAIR HFA) 108 (90 BASE) MCG/ACT inhaler   Inhalation   Inhale 2 puffs into the lungs every 6 (six) hours as needed. For wheezing         . ALPRAZolam (XANAX) 1 MG tablet   Oral   Take 0.5-1 mg by mouth 2 (two) times daily. Take 0.5mg  in the morning & 1mg  at night         . amLODipine (NORVASC) 5 MG tablet   Oral   Take 5 mg by mouth daily.         Marland Kitchen atorvastatin (LIPITOR) 20 MG tablet   Oral   Take 20 mg by mouth daily.           . clopidogrel (PLAVIX) 75 MG tablet   Oral   Take 75 mg by mouth daily.         . ferrous sulfate 325 (65 FE) MG tablet   Oral   Take 325 mg by mouth at bedtime.          . gabapentin (NEURONTIN) 300 MG capsule   Oral   Take 300-600 mg by mouth 2 (two) times daily. Take 300mg  in the morning & 600mg  at bedtime         . HYDROcodone-acetaminophen (NORCO) 10-325 MG per tablet   Oral   Take 1 tablet by mouth every 6 (six) hours as needed for pain. For pain         . JANUVIA 50 MG tablet   Oral   Take 50 mg by mouth daily.          Marland Kitchen levothyroxine  (SYNTHROID, LEVOTHROID) 125 MCG tablet  Oral   Take 125 mcg by mouth daily before breakfast.          . lubiprostone (AMITIZA) 24 MCG capsule   Oral   Take 1 capsule (24 mcg total) by mouth 2 (two) times daily with a meal.   60 capsule   5   . pantoprazole (PROTONIX) 40 MG tablet   Oral   Take 40 mg by mouth 2 (two) times daily.         . ranitidine (ZANTAC) 150 MG tablet   Oral   Take 75 mg by mouth at bedtime as needed. For heartburn         . Vitamin D, Ergocalciferol, (DRISDOL) 50000 UNITS CAPS   Oral   Take 50,000 Units by mouth every 7 (seven) days. Takes on Monday          BP 130/61  Pulse 62  Temp(Src) 98.7 F (37.1 C) (Oral)  Resp 19  SpO2 95% Physical Exam  Nursing note and vitals reviewed. Constitutional: She is oriented to person, place, and time. She appears well-developed and well-nourished. No distress.  HENT:  Head: Normocephalic and atraumatic.  Eyes: EOM are normal. Pupils are equal, round, and reactive to light.  Neck: Normal range of motion. Neck supple.  Cardiovascular: Normal rate, regular rhythm, normal heart sounds and intact distal pulses.   Pulmonary/Chest: Effort normal and breath sounds normal. No respiratory distress. She has no wheezes. She has no rales.  Abdominal: Soft. Normal appearance and bowel sounds are normal. She exhibits no distension, no pulsatile midline mass and no mass. There is tenderness in the epigastric area. There is no rigidity, no rebound, no guarding, no tenderness at McBurney's point and negative Murphy's sign.  Musculoskeletal: Normal range of motion. She exhibits no edema and no tenderness.  Neurological: She is alert and oriented to person, place, and time. She exhibits normal muscle tone. Coordination normal.  Skin: Skin is warm and dry. She is not diaphoretic.    ED Course  Procedures (including critical care time) Labs Review Labs Reviewed  COMPREHENSIVE METABOLIC PANEL - Abnormal; Notable for the  following:    Potassium 3.1 (*)    Glucose, Bld 113 (*)    Albumin 3.1 (*)    GFR calc non Af Amer 54 (*)    GFR calc Af Amer 63 (*)    All other components within normal limits  URINALYSIS, ROUTINE W REFLEX MICROSCOPIC - Abnormal; Notable for the following:    Color, Urine AMBER (*)    APPearance CLOUDY (*)    Bilirubin Urine SMALL (*)    Ketones, ur 15 (*)    Leukocytes, UA SMALL (*)    All other components within normal limits  D-DIMER, QUANTITATIVE - Abnormal; Notable for the following:    D-Dimer, Quant 0.79 (*)    All other components within normal limits  URINE MICROSCOPIC-ADD ON - Abnormal; Notable for the following:    Squamous Epithelial / LPF MANY (*)    Bacteria, UA FEW (*)    All other components within normal limits  URINE CULTURE  LIPASE, BLOOD  TROPONIN I  CBC WITH DIFFERENTIAL   Imaging Review Dg Chest 2 View  07/09/2013   *RADIOLOGY REPORT*  Clinical Data: [name] mid to lower chest pain, history of smoking, hypertension, lung cancer  CHEST - 2 VIEW  Comparison: 03/11/2013; chest CT - 03/11/2013  Findings: Grossly unchanged cardiac silhouette and mediastinal contours.  Stable postsurgical change of the right hilum.  There is  mild tortuosity the thoracic aorta.  Lungs are hyperexpanded with flattening of bilateral hemidiaphragms.  No focal airspace opacity. No pleural effusion or pneumothorax.  No evidence of edema. Unchanged bones.  IMPRESSION: 1.  Hyperexpanded lungs without acute cardiopulmonary disease. 2.  Postsurgical change of the right hilum.   Original Report Authenticated By: Tacey Ruiz, MD   Ct Angio Chest Pe W/cm &/or Wo Cm  07/09/2013   *RADIOLOGY REPORT*  Clinical Data:  Epigastric abdominal pain and mid right sternal pain, history of AAA, evaluate for pulmonary embolism  CT ANGIOGRAPHY CHEST CT ABDOMEN AND PELVIS WITH CONTRAST  Technique:  Multidetector CT imaging of the chest was performed using the standard protocol during bolus administration of  intravenous contrast.  Multiplanar CT image reconstructions including MIPs were obtained to evaluate the vascular anatomy. Multidetector CT imaging of the abdomen and pelvis was performed using the standard protocol during bolus administration of intravenous contrast.  Contrast: OMNIPAQUE IOHEXOL 350 MG/ML SOLN  Comparison:  CT abdomen pelvis - 03/13/2013; CT the chest, abdomen and pelvis - 03/11/2013; chest CT - 06/21/2012  CTA CHEST  Vascular Findings:  There is adequate opacification of the pulmonary arterial system of the main pulmonary artery measuring 450 HU. There are no discrete filling defect within the pulmonary arterial system to suggest pulmonary embolism.  Borderline large caliber of the main pulmonary artery measuring 35 mm in diameter.  Normal heart size.  Coronary artery calcifications.  No pericardial effusion.  Rather extensive atherosclerotic plaque within a normal caliber thoracic aorta.  Bovine configuration of the aortic arch incidentally noted.  No definite periaortic stranding.  Review of the MIP images confirms the above findings.  ------------------------------------------------  Nonvascular findings:  Grossly stable postsurgical changes of the right lower lobe and right hilum.  Nodular soft tissue adjacent to the right lower lobe suture line is grossly unchanged since the 08/20 13 chest CT. Again favored to represent atelectasis or scar.  No new focal airspace opacities.  No pleural effusion or pneumothorax.  The central pulmonary airways are patent.  Mild centrilobular emphysematous change.  Shoddy subcarinal nodal conglomeration is not enlarged by CT criteria with index of lung aeration measuring 9 mm in short axis diameter (image 47, series four).  No mediastinal, hilar or axillary lymphadenopathy.  No acute or aggressive osseous abnormalities.  IMPRESSION:  1.  No acute cardiopulmonary disease.  Specifically, no evidence of pulmonary embolism. 2.  Grossly stable postsurgical  change of the right hilum and right lower lobe with associated nodular thickening along the right lower lobe suture line, similar to the 06/2012 examination and favored to represent residual atelectasis or scar.  ------------------------------------------------------------------- -  CT ABDOMEN AND PELVIS  Findings:  Normal hepatic contour with mild cleft involving the posterior aspect the right lobe of the liver.  No discrete hepatic lesions. Post cholecystectomy.  No intra or extrahepatic biliary duct dilatation.  No ascites.  There is symmetric enhancement and excretion of the bilateral kidneys.  The left kidney appears slightly atrophic in comparison to the right, and particularly involving the inferior pole of the left kidney.  There is an approximately 1.5 cm hypoattenuating (8 HU) cyst within the anterior aspect the inferior pole left kidney (axial image 13, series seven).  Additional bilateral sub centimeter hypoattenuating lesions too small to accurately characterize the favored to represent additional renal cysts.  No definite renal stones on the post contrast examination.  No urinary obstruction or perinephric stranding.  Normal appearance of the bilateral adrenal  glands and spleen.  The pancreas is largely fatty replaced.  Scattered colonic diverticulosis without evidence of diverticulitis.  The bowel otherwise normal in course and caliber without wall thickening or evidence of obstruction.  Normal appearance of the appendix.  No pneumoperitoneum, pneumatosis or portal venous gas.  Grossly unchanged size of known infrarenal abdominal aortic aneurysm measuring approximately 37 mm in greatest oblique short axis axial dimension (image 49, series 2) and approximately 39 mm in greatest oblique coronal dimension (coronal image 57, series five).  There is grossly unchanged minimal to moderate amount of crescentic mural thrombus primarily involving the anterior aspect of the aneurysm sac.  No definite periaortic  stranding.  The abdominal aorta is noted to taper to a near normal caliber above the aortic bifurcation.  There are extensive atherosclerotic plaque involving the origin of the proximal aspect of the major branch vessels of the abdominal aorta, in particular, involving the origin and proximal aspect of the SMA.  The major branch vessels of the abdominal aorta appear patent on this non CTA examination.  Scattered shoddy retroperitoneal lymph nodes are not enlarged by CT criteria.  No retroperitoneal, mesenteric, pelvic or inguinal lymphadenopathy.  Post hysterectomy.  No discrete adnexal lesion.  No free fluid in the pelvis.  No acute or aggressive osseous abnormalities.  Mild to moderate multilevel lumbar spine degenerative change.  IMPRESSION: 1.  No acute findings within the abdomen or pelvis.  Specifically, no enteric or urinary obstruction. 2.  Grossly unchanged size of known infrarenal abdominal aortic aneurysm, measuring approximately 3.7 cm in greatest short axis axial diameter and again noted to contain a moderate amount of crescentic mural thrombus. 3.  Colonic diverticulosis without evidence of diverticulitis. 4.  Grossly unchanged mild asymmetric atrophy of the left kidney.   Original Report Authenticated By: Tacey Ruiz, MD   Ct Abdomen Pelvis W Contrast  07/09/2013   *RADIOLOGY REPORT*  Clinical Data:  Epigastric abdominal pain and mid right sternal pain, history of AAA, evaluate for pulmonary embolism  CT ANGIOGRAPHY CHEST CT ABDOMEN AND PELVIS WITH CONTRAST  Technique:  Multidetector CT imaging of the chest was performed using the standard protocol during bolus administration of intravenous contrast.  Multiplanar CT image reconstructions including MIPs were obtained to evaluate the vascular anatomy. Multidetector CT imaging of the abdomen and pelvis was performed using the standard protocol during bolus administration of intravenous contrast.  Contrast: OMNIPAQUE IOHEXOL 350 MG/ML SOLN   Comparison:  CT abdomen pelvis - 03/13/2013; CT the chest, abdomen and pelvis - 03/11/2013; chest CT - 06/21/2012  CTA CHEST  Vascular Findings:  There is adequate opacification of the pulmonary arterial system of the main pulmonary artery measuring 450 HU. There are no discrete filling defect within the pulmonary arterial system to suggest pulmonary embolism.  Borderline large caliber of the main pulmonary artery measuring 35 mm in diameter.  Normal heart size.  Coronary artery calcifications.  No pericardial effusion.  Rather extensive atherosclerotic plaque within a normal caliber thoracic aorta.  Bovine configuration of the aortic arch incidentally noted.  No definite periaortic stranding.  Review of the MIP images confirms the above findings.  ------------------------------------------------  Nonvascular findings:  Grossly stable postsurgical changes of the right lower lobe and right hilum.  Nodular soft tissue adjacent to the right lower lobe suture line is grossly unchanged since the 08/20 13 chest CT. Again favored to represent atelectasis or scar.  No new focal airspace opacities.  No pleural effusion or pneumothorax.  The central  pulmonary airways are patent.  Mild centrilobular emphysematous change.  Shoddy subcarinal nodal conglomeration is not enlarged by CT criteria with index of lung aeration measuring 9 mm in short axis diameter (image 47, series four).  No mediastinal, hilar or axillary lymphadenopathy.  No acute or aggressive osseous abnormalities.  IMPRESSION:  1.  No acute cardiopulmonary disease.  Specifically, no evidence of pulmonary embolism. 2.  Grossly stable postsurgical change of the right hilum and right lower lobe with associated nodular thickening along the right lower lobe suture line, similar to the 06/2012 examination and favored to represent residual atelectasis or scar.  ------------------------------------------------------------------- -  CT ABDOMEN AND PELVIS  Findings:  Normal  hepatic contour with mild cleft involving the posterior aspect the right lobe of the liver.  No discrete hepatic lesions. Post cholecystectomy.  No intra or extrahepatic biliary duct dilatation.  No ascites.  There is symmetric enhancement and excretion of the bilateral kidneys.  The left kidney appears slightly atrophic in comparison to the right, and particularly involving the inferior pole of the left kidney.  There is an approximately 1.5 cm hypoattenuating (8 HU) cyst within the anterior aspect the inferior pole left kidney (axial image 13, series seven).  Additional bilateral sub centimeter hypoattenuating lesions too small to accurately characterize the favored to represent additional renal cysts.  No definite renal stones on the post contrast examination.  No urinary obstruction or perinephric stranding.  Normal appearance of the bilateral adrenal glands and spleen.  The pancreas is largely fatty replaced.  Scattered colonic diverticulosis without evidence of diverticulitis.  The bowel otherwise normal in course and caliber without wall thickening or evidence of obstruction.  Normal appearance of the appendix.  No pneumoperitoneum, pneumatosis or portal venous gas.  Grossly unchanged size of known infrarenal abdominal aortic aneurysm measuring approximately 37 mm in greatest oblique short axis axial dimension (image 49, series 2) and approximately 39 mm in greatest oblique coronal dimension (coronal image 57, series five).  There is grossly unchanged minimal to moderate amount of crescentic mural thrombus primarily involving the anterior aspect of the aneurysm sac.  No definite periaortic stranding.  The abdominal aorta is noted to taper to a near normal caliber above the aortic bifurcation.  There are extensive atherosclerotic plaque involving the origin of the proximal aspect of the major branch vessels of the abdominal aorta, in particular, involving the origin and proximal aspect of the SMA.  The major  branch vessels of the abdominal aorta appear patent on this non CTA examination.  Scattered shoddy retroperitoneal lymph nodes are not enlarged by CT criteria.  No retroperitoneal, mesenteric, pelvic or inguinal lymphadenopathy.  Post hysterectomy.  No discrete adnexal lesion.  No free fluid in the pelvis.  No acute or aggressive osseous abnormalities.  Mild to moderate multilevel lumbar spine degenerative change.  IMPRESSION: 1.  No acute findings within the abdomen or pelvis.  Specifically, no enteric or urinary obstruction. 2.  Grossly unchanged size of known infrarenal abdominal aortic aneurysm, measuring approximately 3.7 cm in greatest short axis axial diameter and again noted to contain a moderate amount of crescentic mural thrombus. 3.  Colonic diverticulosis without evidence of diverticulitis. 4.  Grossly unchanged mild asymmetric atrophy of the left kidney.   Original Report Authenticated By: Tacey Ruiz, MD     Date: 07/09/2013  Rate: 67  Rhythm: normal sinus rhythm  QRS Axis: normal  Intervals: normal  ST/T Wave abnormalities: normal  Conduction Disutrbances:left bundle branch block  Narrative Interpretation:  Old EKG Reviewed: unchanged   MDM   1. Chest pain   2. Epigastric pain    79 y.o. F with a PMH of CAD s/p PCI, AAA- last measured at 3.9 cm on CT from 4/14, as well as DM with gastroparesis and COPD presenting with chest and abdominal pain.  Pt reports pain was sudden onset just prior to presentation.  Pain described as substernal as well as epigastric, sharp in nature.  Pt reports pleuritic type pain.  No HA, N/V, weakness, numbness, or tingling.  No prior h/o VTE, no recent surgeries or immobilizations, and no LE pain or swelling.  VSS on presentation.  EKG shows NSR with LBBB unchanged from prior study.  Physical exam with mild TTP over epigastrium, no rebound or guarding.  CV and lung exam unremarkable.  No LE edema.  Will check troponin and basic labs.  CT chest to r/o  PE.  CT A/P to assess if AAA has changed.  CT chest negative for PE.  CT A/P shows AAA unchanged in size from prior studies with no other acute abnormalities.  Troponin negative.  Pt re-evaluated and reports continued chest pain despite NTG and morphine.  Given pt's high risk for ACS, will admit for further workup.  Discussed with attending Dr. Criss Alvine.      Jodean Lima, MD 07/10/13 [phone]

## 2013-07-10 ENCOUNTER — Encounter (HOSPITAL_COMMUNITY): Payer: Self-pay | Admitting: Anesthesiology

## 2013-07-10 DIAGNOSIS — I10 Essential (primary) hypertension: Secondary | ICD-10-CM

## 2013-07-10 DIAGNOSIS — I2510 Atherosclerotic heart disease of native coronary artery without angina pectoris: Secondary | ICD-10-CM

## 2013-07-10 DIAGNOSIS — R109 Unspecified abdominal pain: Secondary | ICD-10-CM

## 2013-07-10 DIAGNOSIS — K219 Gastro-esophageal reflux disease without esophagitis: Secondary | ICD-10-CM

## 2013-07-10 DIAGNOSIS — I714 Abdominal aortic aneurysm, without rupture: Secondary | ICD-10-CM

## 2013-07-10 DIAGNOSIS — R079 Chest pain, unspecified: Secondary | ICD-10-CM

## 2013-07-10 DIAGNOSIS — R1314 Dysphagia, pharyngoesophageal phase: Secondary | ICD-10-CM

## 2013-07-10 DIAGNOSIS — E785 Hyperlipidemia, unspecified: Secondary | ICD-10-CM

## 2013-07-10 LAB — BASIC METABOLIC PANEL
CO2: 27 mEq/L (ref 19–32)
Calcium: 8.7 mg/dL (ref 8.4–10.5)
Creatinine, Ser: 0.92 mg/dL (ref 0.50–1.10)
Glucose, Bld: 91 mg/dL (ref 70–99)
Sodium: 142 mEq/L (ref 135–145)

## 2013-07-10 LAB — CBC
HCT: 39.9 % (ref 36.0–46.0)
HCT: 46 %
Hemoglobin-A1c: 5.6
MCH: 30.4 pg (ref 26.0–34.0)
MCV: 91.3 fL (ref 78.0–100.0)
MCV: 90.2 fL
Platelets: 210 10*3/uL (ref 150–400)
Platelets: 258 10*3/uL
RBC: 4.37 MIL/uL (ref 3.87–5.11)
TSH: 1.64 u[IU]/mL (ref 0.41–5.90)

## 2013-07-10 LAB — COMPREHENSIVE METABOLIC PANEL
Albumin: 4.1
Alkaline Phosphatase: 98 U/L
BUN: 8 mg/dL (ref 4–21)
Creat: 1
Glucose: 91
Total Bilirubin: 0.6 mg/dL

## 2013-07-10 LAB — URINE CULTURE

## 2013-07-10 LAB — GLUCOSE, CAPILLARY: Glucose-Capillary: 102 mg/dL — ABNORMAL HIGH (ref 70–99)

## 2013-07-10 MED ORDER — LUBIPROSTONE 24 MCG PO CAPS
24.0000 ug | ORAL_CAPSULE | Freq: Two times a day (BID) | ORAL | Status: DC
  Administered 2013-07-10: 24 ug via ORAL
  Filled 2013-07-10 (×3): qty 1

## 2013-07-10 MED ORDER — FERROUS SULFATE 325 (65 FE) MG PO TABS
325.0000 mg | ORAL_TABLET | Freq: Every day | ORAL | Status: DC
  Filled 2013-07-10: qty 1

## 2013-07-10 MED ORDER — ALPRAZOLAM 0.5 MG PO TABS
1.0000 mg | ORAL_TABLET | Freq: Every day | ORAL | Status: DC
  Administered 2013-07-10: 1 mg via ORAL
  Filled 2013-07-10: qty 2

## 2013-07-10 MED ORDER — GABAPENTIN 300 MG PO CAPS
600.0000 mg | ORAL_CAPSULE | Freq: Every day | ORAL | Status: DC
  Administered 2013-07-10: 600 mg via ORAL
  Filled 2013-07-10 (×2): qty 2

## 2013-07-10 MED ORDER — ATORVASTATIN CALCIUM 20 MG PO TABS
20.0000 mg | ORAL_TABLET | Freq: Every day | ORAL | Status: DC
  Administered 2013-07-10: 20 mg via ORAL
  Filled 2013-07-10: qty 1

## 2013-07-10 MED ORDER — AMLODIPINE BESYLATE 5 MG PO TABS
5.0000 mg | ORAL_TABLET | Freq: Every day | ORAL | Status: DC
  Administered 2013-07-10: 5 mg via ORAL
  Filled 2013-07-10: qty 1

## 2013-07-10 MED ORDER — PANTOPRAZOLE SODIUM 40 MG PO TBEC
40.0000 mg | DELAYED_RELEASE_TABLET | Freq: Two times a day (BID) | ORAL | Status: DC
  Administered 2013-07-10 (×2): 40 mg via ORAL
  Filled 2013-07-10 (×2): qty 1

## 2013-07-10 MED ORDER — GABAPENTIN 300 MG PO CAPS: 300.0000 mg | ORAL_CAPSULE | Freq: Two times a day (BID) | ORAL | Status: DC

## 2013-07-10 MED ORDER — ONDANSETRON HCL 4 MG/2ML IJ SOLN: 4.0000 mg | Freq: Three times a day (TID) | INTRAMUSCULAR | Status: DC | PRN

## 2013-07-10 MED ORDER — CLOPIDOGREL BISULFATE 75 MG PO TABS
75.0000 mg | ORAL_TABLET | Freq: Every day | ORAL | Status: DC
  Administered 2013-07-10: 75 mg via ORAL
  Filled 2013-07-10: qty 1

## 2013-07-10 MED ORDER — HEPARIN SODIUM (PORCINE) 5000 UNIT/ML IJ SOLN
5000.0000 [IU] | Freq: Three times a day (TID) | INTRAMUSCULAR | Status: DC
  Administered 2013-07-10: 5000 [IU] via SUBCUTANEOUS
  Filled 2013-07-10 (×4): qty 1

## 2013-07-10 MED ORDER — GABAPENTIN 300 MG PO CAPS
300.0000 mg | ORAL_CAPSULE | Freq: Every day | ORAL | Status: DC
  Administered 2013-07-10: 300 mg via ORAL
  Filled 2013-07-10: qty 1

## 2013-07-10 MED ORDER — POLYETHYLENE GLYCOL 3350 17 G PO PACK
17.0000 g | PACK | Freq: Every day | ORAL | Status: DC | PRN
  Filled 2013-07-10: qty 1

## 2013-07-10 MED ORDER — LEVOTHYROXINE SODIUM 125 MCG PO TABS
125.0000 ug | ORAL_TABLET | Freq: Every day | ORAL | Status: DC
  Administered 2013-07-10: 125 ug via ORAL
  Filled 2013-07-10 (×2): qty 1

## 2013-07-10 MED ORDER — HYDROCODONE-ACETAMINOPHEN 10-325 MG PO TABS: 1.0000 | ORAL_TABLET | Freq: Four times a day (QID) | ORAL | Status: DC | PRN

## 2013-07-10 MED ORDER — ALPRAZOLAM 0.5 MG PO TABS
0.5000 mg | ORAL_TABLET | Freq: Every day | ORAL | Status: DC
  Administered 2013-07-10: 0.5 mg via ORAL
  Filled 2013-07-10: qty 1

## 2013-07-10 MED ORDER — SODIUM CHLORIDE 0.9 % IJ SOLN
3.0000 mL | Freq: Two times a day (BID) | INTRAMUSCULAR | Status: DC
  Administered 2013-07-10 (×2): 3 mL via INTRAVENOUS

## 2013-07-10 MED ORDER — FAMOTIDINE 20 MG PO TABS
20.0000 mg | ORAL_TABLET | Freq: Two times a day (BID) | ORAL | Status: DC
  Administered 2013-07-10 (×2): 20 mg via ORAL
  Filled 2013-07-10 (×3): qty 1

## 2013-07-10 MED ORDER — HYDROMORPHONE HCL PF 1 MG/ML IJ SOLN: 1.0000 mg | INTRAMUSCULAR | Status: DC | PRN

## 2013-07-10 MED ORDER — ALPRAZOLAM 0.5 MG PO TABS: 0.5000 mg | ORAL_TABLET | Freq: Two times a day (BID) | ORAL | Status: DC

## 2013-07-10 MED ORDER — ALBUTEROL SULFATE HFA 108 (90 BASE) MCG/ACT IN AERS: 2.0000 | INHALATION_SPRAY | Freq: Four times a day (QID) | RESPIRATORY_TRACT | Status: DC | PRN

## 2013-07-10 NOTE — H&P (Addendum)
Madison Henson is an 79 y.o. female.   Chief Complaint: epigastric/chest pain HPI: 79 yo woman with multiple medical problems outlined below presents with epigastric/chest pain.  She states that earlier today, she had a sudden onset of epigastric pain.  Sharp/stabbing, moved up to her chest and across lower ribs.  She took NTG, ASA and eventually got morphine in ED without significant relief.  8/10 at its worst.  Initial work up in ED unrevealing including labs and CTA c/a/p.  Past Medical History  Diagnosis Date  . Coronary artery disease     post diaphragmatic wall infarction on 06-2007,due to stent thrombosis   . Chronic obstructive pulmonary disease   . Tobacco abuse   . Hypertension   . Hyperlipidemia   . GERD (gastroesophageal reflux disease)   . Anxiety disorder   . Depression   . Hypothyroidism   . Hx of hysterectomy   . AAA (abdominal aortic aneurysm)   . Pulmonary nodule 04/2011    neg bx, followed by Dr. Edwyna Shell, may still be cancer, being followed with CTs.  . Esophageal dysmotility     Two BPE since 03/2011-->Moderate impairment of esophageal motility.Vallecular residuals. Laryngeal penetration  . Anemia   . Diabetes mellitus     with gastroparesis 70% retention at 2 hours  . Cancer   . Lung cancer   . CHF (congestive heart failure)   . Myocardial infarction     Past Surgical History  Procedure Laterality Date  . Cholecystectomy    . Cataract surg    . Bladder surgery      tack  . Kidney stone surgery    . Coronary stent placement  2004    2  . Coronary stent placement  2008    2  . S/p hysterectomy    . Neck surgery    . Esophagogastroduodenoscopy  5/08    normal esophagus, No H.Pylori  . Egd/bravo  07/2008    On Nexium BID. Day 1 DMSTR Score: 0.7, day 2: DMSTR score: 32, uncontrolled GERD, No H. Pylori  . Hbt  2009    negative  . Colonoscopy  05/2010    tortuous sigm colon, sigm tics, multiple simple adenomas, hemorrhoids, next TCS 10-15 yrs per Dr.  Darrick Penna. Previous h/o tubular adenomas in 2008.   Marland Kitchen Esophagogastroduodenoscopy  05/2010    small hh, streaky erythema in body/antrum. Duodenal bx negative.   . Esophagogastroduodenoscopy  06/22/2011    mild gastritis/ring in the distal esophagus, savary dilation. Bx reactive gastropathy, No H.pylori  . Savory dilation  06/22/2011    Procedure: SAVORY DILATION;  Surgeon: Arlyce Harman, MD;  Location: [facility];  Service: Endoscopy;  Laterality: N/A;  [name] dilation  06/22/2011    Procedure: [name] DILATION;  Surgeon: Arlyce Harman, MD;  Location: [facility];  Service: Endoscopy;  Laterality: N/A;  . Video brochoscopy with electromagnetic navigaton  05/10/2011    [name]  . Right lower lobe superior segmentectomy  October 2012  . Hysterectomy at age 42    . Cardiac catheterization    . Abdominal hysterectomy      Family History  Problem Relation Age of Onset  . Coronary artery disease      family hx of  . Colon cancer Sister 42  . Colon cancer Daughter   . Prostate cancer      family hx of  . Lung cancer      family hx of  . Ovarian cancer  family hx of  . Arthritis      family hx of  . Other      family hx of chronic respiratory condition   Social History:  reports that she has been smoking Cigarettes.  She has been smoking about 0.50 packs per day. She does not have any smokeless tobacco history on file. She reports that she does not drink alcohol or use illicit drugs.  Allergies:  Allergies  Allergen Reactions  . Sulfonamide Derivatives Swelling and Rash    Hands & feet swell    Medications Prior to Admission  Medication Sig Dispense Refill  . albuterol (PROAIR HFA) 108 (90 BASE) MCG/ACT inhaler Inhale 2 puffs into the lungs every 6 (six) hours as needed. For wheezing      . ALPRAZolam (XANAX) 1 MG tablet Take 0.5-1 mg by mouth 2 (two) times daily. Take 0.5mg  in the morning & 1mg  at night      . amLODipine (NORVASC) 5 MG tablet Take 5 mg by mouth  daily.      Marland Kitchen atorvastatin (LIPITOR) 20 MG tablet Take 20 mg by mouth daily.        . clopidogrel (PLAVIX) 75 MG tablet Take 75 mg by mouth daily.      . ferrous sulfate 325 (65 FE) MG tablet Take 325 mg by mouth at bedtime.       . gabapentin (NEURONTIN) 300 MG capsule Take 300-600 mg by mouth 2 (two) times daily. Take 300mg  in the morning & 600mg  at bedtime      . HYDROcodone-acetaminophen (NORCO) 10-325 MG per tablet Take 1 tablet by mouth every 6 (six) hours as needed for pain. For pain      . JANUVIA 50 MG tablet Take 50 mg by mouth daily.       Marland Kitchen levothyroxine (SYNTHROID, LEVOTHROID) 125 MCG tablet Take 125 mcg by mouth daily before breakfast.       . lubiprostone (AMITIZA) 24 MCG capsule Take 1 capsule (24 mcg total) by mouth 2 (two) times daily with a meal.  60 capsule  5  . pantoprazole (PROTONIX) 40 MG tablet Take 40 mg by mouth 2 (two) times daily.      . ranitidine (ZANTAC) 150 MG tablet Take 75 mg by mouth at bedtime as needed. For heartburn      . Vitamin D, Ergocalciferol, (DRISDOL) 50000 UNITS CAPS Take 50,000 Units by mouth every 7 (seven) days. Takes on Monday        Results for orders placed during the hospital encounter of 07/09/13 (from the past 48 hour(s))  COMPREHENSIVE METABOLIC PANEL     Status: Abnormal   Collection Time    07/09/13  5:18 PM      Result Value Range   Sodium 142  135 - 145 mEq/L   Potassium 3.1 (*) 3.5 - 5.1 mEq/L   Chloride 105  96 - 112 mEq/L   CO2 28  19 - 32 mEq/L   Glucose, Bld 113 (*) 70 - 99 mg/dL   BUN 9  6 - 23 mg/dL   Creatinine, Ser 4.09  0.50 - 1.10 mg/dL   Calcium 8.8  8.4 - 81.1 mg/dL   Total Protein 6.3  6.0 - 8.3 g/dL   Albumin 3.1 (*) 3.5 - 5.2 g/dL   AST 9  0 - 37 U/L   ALT 6  0 - 35 U/L   Alkaline Phosphatase 97  39 - 117 U/L   Total Bilirubin 0.3  0.3 -  1.2 mg/dL   GFR calc non Af Amer 54 (*) >90 mL/min   GFR calc Af Amer 63 (*) >90 mL/min   Comment: (NOTE)     The eGFR has been calculated using the CKD EPI equation.      This calculation has not been validated in all clinical situations.     eGFR's persistently <90 mL/min signify possible Chronic Kidney     Disease.  LIPASE, BLOOD     Status: None   Collection Time    07/09/13  5:18 PM      Result Value Range   Lipase 23  11 - 59 U/L  TROPONIN I     Status: None   Collection Time    07/09/13  5:18 PM      Result Value Range   Troponin I <0.30  <0.30 ng/mL   Comment:            Due to the release kinetics of cTnI,     a negative result within the first hours     of the onset of symptoms does not rule out     myocardial infarction with certainty.     If myocardial infarction is still suspected,     repeat the test at appropriate intervals.  CBC WITH DIFFERENTIAL     Status: None   Collection Time    07/09/13  5:18 PM      Result Value Range   WBC 7.5  4.0 - 10.5 K/uL   RBC 4.37  3.87 - 5.11 MIL/uL   Hemoglobin 13.4  12.0 - 15.0 g/dL   HCT 27.2  53.6 - 64.4 %   MCV 91.5  78.0 - 100.0 fL   MCH 30.7  26.0 - 34.0 pg   MCHC 33.5  30.0 - 36.0 g/dL   RDW 03.4  74.2 - 59.5 %   Platelets 206  150 - 400 K/uL   Neutrophils Relative % 64  43 - 77 %   Neutro Abs 4.8  1.7 - 7.7 K/uL   Lymphocytes Relative 26  12 - 46 %   Lymphs Abs 2.0  0.7 - 4.0 K/uL   Monocytes Relative 8  3 - 12 %   Monocytes Absolute 0.6  0.1 - 1.0 K/uL   Eosinophils Relative 2  0 - 5 %   Eosinophils Absolute 0.1  0.0 - 0.7 K/uL   Basophils Relative 0  0 - 1 %   Basophils Absolute 0.0  0.0 - 0.1 K/uL  URINALYSIS, ROUTINE W REFLEX MICROSCOPIC     Status: Abnormal   Collection Time    07/09/13  6:46 PM      Result Value Range   Color, Urine AMBER (*) YELLOW   Comment: BIOCHEMICALS MAY BE AFFECTED BY COLOR   APPearance CLOUDY (*) CLEAR   Specific Gravity, Urine 1.021  1.005 - 1.030   pH 6.5  5.0 - 8.0   Glucose, UA NEGATIVE  NEGATIVE mg/dL   Hgb urine dipstick NEGATIVE  NEGATIVE   Bilirubin Urine SMALL (*) NEGATIVE   Ketones, ur 15 (*) NEGATIVE mg/dL   Protein, ur NEGATIVE   NEGATIVE mg/dL   Urobilinogen, UA 1.0  0.0 - 1.0 mg/dL   Nitrite NEGATIVE  NEGATIVE   Leukocytes, UA SMALL (*) NEGATIVE  URINE MICROSCOPIC-ADD ON     Status: Abnormal   Collection Time    07/09/13  6:46 PM      Result Value Range   Squamous Epithelial / LPF MANY (*)  RARE   WBC, UA 3-6  <3 WBC/hpf   RBC / HPF 0-2  <3 RBC/hpf   Bacteria, UA FEW (*) RARE  D-DIMER, QUANTITATIVE     Status: Abnormal   Collection Time    07/09/13  7:27 PM      Result Value Range   D-Dimer, Quant 0.79 (*) 0.00 - 0.48 ug/mL-FEU   Comment:            AT THE INHOUSE ESTABLISHED CUTOFF     VALUE OF 0.48 ug/mL FEU,     THIS ASSAY HAS BEEN DOCUMENTED     IN THE LITERATURE TO HAVE     A SENSITIVITY AND NEGATIVE     PREDICTIVE VALUE OF AT LEAST     98 TO 99%.  THE TEST RESULT     SHOULD BE CORRELATED WITH     AN ASSESSMENT OF THE CLINICAL     PROBABILITY OF DVT / VTE.  GLUCOSE, CAPILLARY     Status: Abnormal   Collection Time    07/10/13 12:22 AM      Result Value Range   Glucose-Capillary 102 (*) 70 - 99 mg/dL   Dg Chest 2 View  12/29/863   *RADIOLOGY REPORT*  Clinical Data: [name] mid to lower chest pain, history of smoking, hypertension, lung cancer  CHEST - 2 VIEW  Comparison: 03/11/2013; chest CT - 03/11/2013  Findings: Grossly unchanged cardiac silhouette and mediastinal contours.  Stable postsurgical change of the right hilum.  There is mild tortuosity the thoracic aorta.  Lungs are hyperexpanded with flattening of bilateral hemidiaphragms.  No focal airspace opacity. No pleural effusion or pneumothorax.  No evidence of edema. Unchanged bones.  IMPRESSION: 1.  Hyperexpanded lungs without acute cardiopulmonary disease. 2.  Postsurgical change of the right hilum.   Original Report Authenticated By: Tacey Ruiz, MD   Ct Angio Chest Pe W/cm &/or Wo Cm  07/09/2013   *RADIOLOGY REPORT*  Clinical Data:  Epigastric abdominal pain and mid right sternal pain, history of AAA, evaluate for pulmonary embolism  CT  ANGIOGRAPHY CHEST CT ABDOMEN AND PELVIS WITH CONTRAST  Technique:  Multidetector CT imaging of the chest was performed using the standard protocol during bolus administration of intravenous contrast.  Multiplanar CT image reconstructions including MIPs were obtained to evaluate the vascular anatomy. Multidetector CT imaging of the abdomen and pelvis was performed using the standard protocol during bolus administration of intravenous contrast.  Contrast: OMNIPAQUE IOHEXOL 350 MG/ML SOLN  Comparison:  CT abdomen pelvis - 03/13/2013; CT the chest, abdomen and pelvis - 03/11/2013; chest CT - 06/21/2012  CTA CHEST  Vascular Findings:  There is adequate opacification of the pulmonary arterial system of the main pulmonary artery measuring 450 HU. There are no discrete filling defect within the pulmonary arterial system to suggest pulmonary embolism.  Borderline large caliber of the main pulmonary artery measuring 35 mm in diameter.  Normal heart size.  Coronary artery calcifications.  No pericardial effusion.  Rather extensive atherosclerotic plaque within a normal caliber thoracic aorta.  Bovine configuration of the aortic arch incidentally noted.  No definite periaortic stranding.  Review of the MIP images confirms the above findings.  ------------------------------------------------  Nonvascular findings:  Grossly stable postsurgical changes of the right lower lobe and right hilum.  Nodular soft tissue adjacent to the right lower lobe suture line is grossly unchanged since the 08/20 13 chest CT. Again favored to represent atelectasis or scar.  No new focal airspace opacities.  No  pleural effusion or pneumothorax.  The central pulmonary airways are patent.  Mild centrilobular emphysematous change.  Shoddy subcarinal nodal conglomeration is not enlarged by CT criteria with index of lung aeration measuring 9 mm in short axis diameter (image 47, series four).  No mediastinal, hilar or axillary lymphadenopathy.  No  acute or aggressive osseous abnormalities.  IMPRESSION:  1.  No acute cardiopulmonary disease.  Specifically, no evidence of pulmonary embolism. 2.  Grossly stable postsurgical change of the right hilum and right lower lobe with associated nodular thickening along the right lower lobe suture line, similar to the 06/2012 examination and favored to represent residual atelectasis or scar.  ------------------------------------------------------------------- -  CT ABDOMEN AND PELVIS  Findings:  Normal hepatic contour with mild cleft involving the posterior aspect the right lobe of the liver.  No discrete hepatic lesions. Post cholecystectomy.  No intra or extrahepatic biliary duct dilatation.  No ascites.  There is symmetric enhancement and excretion of the bilateral kidneys.  The left kidney appears slightly atrophic in comparison to the right, and particularly involving the inferior pole of the left kidney.  There is an approximately 1.5 cm hypoattenuating (8 HU) cyst within the anterior aspect the inferior pole left kidney (axial image 13, series seven).  Additional bilateral sub centimeter hypoattenuating lesions too small to accurately characterize the favored to represent additional renal cysts.  No definite renal stones on the post contrast examination.  No urinary obstruction or perinephric stranding.  Normal appearance of the bilateral adrenal glands and spleen.  The pancreas is largely fatty replaced.  Scattered colonic diverticulosis without evidence of diverticulitis.  The bowel otherwise normal in course and caliber without wall thickening or evidence of obstruction.  Normal appearance of the appendix.  No pneumoperitoneum, pneumatosis or portal venous gas.  Grossly unchanged size of known infrarenal abdominal aortic aneurysm measuring approximately 37 mm in greatest oblique short axis axial dimension (image 49, series 2) and approximately 39 mm in greatest oblique coronal dimension (coronal image 57, series  five).  There is grossly unchanged minimal to moderate amount of crescentic mural thrombus primarily involving the anterior aspect of the aneurysm sac.  No definite periaortic stranding.  The abdominal aorta is noted to taper to a near normal caliber above the aortic bifurcation.  There are extensive atherosclerotic plaque involving the origin of the proximal aspect of the major branch vessels of the abdominal aorta, in particular, involving the origin and proximal aspect of the SMA.  The major branch vessels of the abdominal aorta appear patent on this non CTA examination.  Scattered shoddy retroperitoneal lymph nodes are not enlarged by CT criteria.  No retroperitoneal, mesenteric, pelvic or inguinal lymphadenopathy.  Post hysterectomy.  No discrete adnexal lesion.  No free fluid in the pelvis.  No acute or aggressive osseous abnormalities.  Mild to moderate multilevel lumbar spine degenerative change.  IMPRESSION: 1.  No acute findings within the abdomen or pelvis.  Specifically, no enteric or urinary obstruction. 2.  Grossly unchanged size of known infrarenal abdominal aortic aneurysm, measuring approximately 3.7 cm in greatest short axis axial diameter and again noted to contain a moderate amount of crescentic mural thrombus. 3.  Colonic diverticulosis without evidence of diverticulitis. 4.  Grossly unchanged mild asymmetric atrophy of the left kidney.   Original Report Authenticated By: Tacey Ruiz, MD   Ct Abdomen Pelvis W Contrast  07/09/2013   *RADIOLOGY REPORT*  Clinical Data:  Epigastric abdominal pain and mid right sternal pain, history of AAA, evaluate  for pulmonary embolism  CT ANGIOGRAPHY CHEST CT ABDOMEN AND PELVIS WITH CONTRAST  Technique:  Multidetector CT imaging of the chest was performed using the standard protocol during bolus administration of intravenous contrast.  Multiplanar CT image reconstructions including MIPs were obtained to evaluate the vascular anatomy. Multidetector CT imaging  of the abdomen and pelvis was performed using the standard protocol during bolus administration of intravenous contrast.  Contrast: OMNIPAQUE IOHEXOL 350 MG/ML SOLN  Comparison:  CT abdomen pelvis - 03/13/2013; CT the chest, abdomen and pelvis - 03/11/2013; chest CT - 06/21/2012  CTA CHEST  Vascular Findings:  There is adequate opacification of the pulmonary arterial system of the main pulmonary artery measuring 450 HU. There are no discrete filling defect within the pulmonary arterial system to suggest pulmonary embolism.  Borderline large caliber of the main pulmonary artery measuring 35 mm in diameter.  Normal heart size.  Coronary artery calcifications.  No pericardial effusion.  Rather extensive atherosclerotic plaque within a normal caliber thoracic aorta.  Bovine configuration of the aortic arch incidentally noted.  No definite periaortic stranding.  Review of the MIP images confirms the above findings.  ------------------------------------------------  Nonvascular findings:  Grossly stable postsurgical changes of the right lower lobe and right hilum.  Nodular soft tissue adjacent to the right lower lobe suture line is grossly unchanged since the 08/20 13 chest CT. Again favored to represent atelectasis or scar.  No new focal airspace opacities.  No pleural effusion or pneumothorax.  The central pulmonary airways are patent.  Mild centrilobular emphysematous change.  Shoddy subcarinal nodal conglomeration is not enlarged by CT criteria with index of lung aeration measuring 9 mm in short axis diameter (image 47, series four).  No mediastinal, hilar or axillary lymphadenopathy.  No acute or aggressive osseous abnormalities.  IMPRESSION:  1.  No acute cardiopulmonary disease.  Specifically, no evidence of pulmonary embolism. 2.  Grossly stable postsurgical change of the right hilum and right lower lobe with associated nodular thickening along the right lower lobe suture line, similar to the 06/2012  examination and favored to represent residual atelectasis or scar.  ------------------------------------------------------------------- -  CT ABDOMEN AND PELVIS  Findings:  Normal hepatic contour with mild cleft involving the posterior aspect the right lobe of the liver.  No discrete hepatic lesions. Post cholecystectomy.  No intra or extrahepatic biliary duct dilatation.  No ascites.  There is symmetric enhancement and excretion of the bilateral kidneys.  The left kidney appears slightly atrophic in comparison to the right, and particularly involving the inferior pole of the left kidney.  There is an approximately 1.5 cm hypoattenuating (8 HU) cyst within the anterior aspect the inferior pole left kidney (axial image 13, series seven).  Additional bilateral sub centimeter hypoattenuating lesions too small to accurately characterize the favored to represent additional renal cysts.  No definite renal stones on the post contrast examination.  No urinary obstruction or perinephric stranding.  Normal appearance of the bilateral adrenal glands and spleen.  The pancreas is largely fatty replaced.  Scattered colonic diverticulosis without evidence of diverticulitis.  The bowel otherwise normal in course and caliber without wall thickening or evidence of obstruction.  Normal appearance of the appendix.  No pneumoperitoneum, pneumatosis or portal venous gas.  Grossly unchanged size of known infrarenal abdominal aortic aneurysm measuring approximately 37 mm in greatest oblique short axis axial dimension (image 49, series 2) and approximately 39 mm in greatest oblique coronal dimension (coronal image 57, series five).  There is grossly  unchanged minimal to moderate amount of crescentic mural thrombus primarily involving the anterior aspect of the aneurysm sac.  No definite periaortic stranding.  The abdominal aorta is noted to taper to a near normal caliber above the aortic bifurcation.  There are extensive atherosclerotic  plaque involving the origin of the proximal aspect of the major branch vessels of the abdominal aorta, in particular, involving the origin and proximal aspect of the SMA.  The major branch vessels of the abdominal aorta appear patent on this non CTA examination.  Scattered shoddy retroperitoneal lymph nodes are not enlarged by CT criteria.  No retroperitoneal, mesenteric, pelvic or inguinal lymphadenopathy.  Post hysterectomy.  No discrete adnexal lesion.  No free fluid in the pelvis.  No acute or aggressive osseous abnormalities.  Mild to moderate multilevel lumbar spine degenerative change.  IMPRESSION: 1.  No acute findings within the abdomen or pelvis.  Specifically, no enteric or urinary obstruction. 2.  Grossly unchanged size of known infrarenal abdominal aortic aneurysm, measuring approximately 3.7 cm in greatest short axis axial diameter and again noted to contain a moderate amount of crescentic mural thrombus. 3.  Colonic diverticulosis without evidence of diverticulitis. 4.  Grossly unchanged mild asymmetric atrophy of the left kidney.   Original Report Authenticated By: Tacey Ruiz, MD    Review of Systems  All other systems reviewed and are negative.    Blood pressure 124/51, pulse 62, temperature 98.1 F (36.7 C), temperature source Oral, resp. rate 18, height 5\' 9"  (1.753 m), weight 88.225 kg (194 lb 8 oz), SpO2 95.00%. Physical Exam  Constitutional: She is oriented to person, place, and time. She appears well-developed. No distress.  Elderly woman  HENT:  Head: Normocephalic and atraumatic.  Eyes: Conjunctivae and EOM are normal. Pupils are equal, round, and reactive to light. Right eye exhibits no discharge. Left eye exhibits no discharge. No scleral icterus.  Neck: Normal range of motion. Neck supple. No JVD present. No tracheal deviation present. No thyromegaly present.  Cardiovascular: Normal rate, regular rhythm and normal heart sounds.  Exam reveals no gallop and no friction  rub.   No murmur heard. Respiratory: Effort normal. No stridor. No respiratory distress. She has wheezes. She has no rales. She exhibits no tenderness.  Prolonged expiration  GI: Soft. Bowel sounds are normal. She exhibits no distension. There is no tenderness. There is no rebound and no guarding.  Musculoskeletal: She exhibits no edema.  Neurological: She is alert and oriented to person, place, and time.  Skin: Skin is warm and dry. She is not diaphoretic.  Psychiatric: She has a normal mood and affect. Her behavior is normal. Judgment and thought content normal.     Assessment/Plan 79 yo woman with multiple medical problems presents with epigastric/chest pain.  Currently most life threatening causes have been ruled out with current work up.  This includes PTX, PNA, aortic dissection, and PE.  Cardiac ischemia unlikely with negative troponin and unchanged LBBB on ECG.  No evidence of hepatobiliary process with normal LFTs, lipase and CT.  Of note, AAA also stable.  Most likely etiology at this time is possible GERD/esophageal spasm given history of GERD and stricture.  - cycle cardiac biomarkers - symptomatic tx - consider GI consult vs OP follow up - continue home meds for stable medical problems - dysphagia 1 diet given stricture, may need to modify  Navada Osterhout 07/10/2013, 1:00 AM

## 2013-07-10 NOTE — Discharge Summary (Addendum)
Physician Discharge Summary  Madison Henson:[id number] DOB: [birth year] DOA: 07/09/2013  PCP: Madison Pizza, MD  Admit date: 07/09/2013 Discharge date: 07/10/2013  Time spent: 25 minutes  Recommendations for Outpatient Follow-up:  1. Recommend outpatient evaluation by her cardiologist Dr. [name] 2. Recommend followup with gastroenterologist, as her heart score is about 4 but sounds a lot more gastrointestinal than cardiac and she had reproducible pressure in the chest   Discharge Diagnoses:  Principal Problem:   Chest pain   Discharge Condition: good  Diet recommendation: prior  Filed Weights   07/10/13 0019 07/10/13 0613  Weight: 88.225 kg (194 lb 8 oz) 88.225 kg (194 lb 8 oz)    History of present illness:  This 79 year old female, known history of dysphagia dysmotility status post dilatation 2012, one vessel cardiac disease, Renal artery stenosis known abdominal aneurysm, and recent low risk stress test 12/05/2012 showing EF of 50% with normal wall function presented with chest pain. Chest pain was ill-defined and dislocated across the lower thoracoabdominal area with some radiation up into the chest. She was found to have this on laying still in the house while watching TV. This was sort of a constant pain and felt a little bit similar to her is a fascial/gastric-type pain.  It seems somewhat more severe in terms of the fact that she because she got tachycardiac and then came to the Emergency room. Her heart score was about 4 and her cardiac enzymes were negative with no EKG changes  It was thought that she may benefit from close outpatient followup end this has been set up with Dr. Verne Henson, who I will copy on this note. Of note on pressure over her chest wall she has pain as well   She should followup with her primary gastroenterologist and other physicians for further workup of her chronic issues     Discharge Exam: Filed Vitals:   07/10/13 0613  BP:  119/55  Pulse: 70  Temp: 97.2 F (36.2 C)  Resp: 18   Alert pleasant oriented no apparent distress currently   General:  Alert pleasant Cardiovascular:  S1-S2 no murmur rub or gallop Respiratory: clinically clear   Discharge Instructions   Future Appointments Provider Department Dept Phone   07/13/2013 10:30 AM [facility]g 3 ([place]) [facility] [phone]   10/09/2013 10:15 AM [name], MD [facility] Main Office [place]) [phone]       Medication List         ALPRAZolam 1 MG tablet  Commonly known as:  XANAX  Take 0.5-1 mg by mouth 2 (two) times daily. Take 0.5mg  in the morning & 1mg  at night     amLODipine 5 MG tablet  Commonly known as:  NORVASC  Take 5 mg by mouth daily.     atorvastatin 20 MG tablet  Commonly known as:  LIPITOR  Take 20 mg by mouth daily.     clopidogrel 75 MG tablet  Commonly known as:  PLAVIX  Take 75 mg by mouth daily.     ferrous sulfate 325 (65 FE) MG tablet  Take 325 mg by mouth at bedtime.     gabapentin 300 MG capsule  Commonly known as:  NEURONTIN  Take 300-600 mg by mouth 2 (two) times daily. Take 300mg  in the morning & 600mg  at bedtime     HYDROcodone-acetaminophen 10-325 MG per tablet  Commonly known as:  NORCO  Take 1 tablet by mouth every 6 (six) hours as needed for pain.  For pain     JANUVIA 50 MG tablet  Generic drug:  sitaGLIPtin  Take 50 mg by mouth daily.     levothyroxine 125 MCG tablet  Commonly known as:  SYNTHROID, LEVOTHROID  Take 125 mcg by mouth daily before breakfast.     lubiprostone 24 MCG capsule  Commonly known as:  AMITIZA  Take 1 capsule (24 mcg total) by mouth 2 (two) times daily with a meal.     pantoprazole 40 MG tablet  Commonly known as:  PROTONIX  Take 40 mg by mouth 2 (two) times daily.     PROAIR HFA 108 (90 BASE) MCG/ACT inhaler  Generic drug:  albuterol  Inhale 2 puffs into the lungs every 6 (six) hours as needed. For wheezing     ranitidine  150 MG tablet  Commonly known as:  ZANTAC  Take 75 mg by mouth at bedtime as needed. For heartburn     Vitamin D (Ergocalciferol) 50000 UNITS Caps capsule  Commonly known as:  DRISDOL  Take 50,000 Units by mouth every 7 (seven) days. Takes on Monday       Allergies  Allergen Reactions  . Sulfonamide Derivatives Swelling and Rash    Hands & feet swell   Follow-up Information   Follow up with Madison Carrow, MD On 07/18/2013. (10:30am)    Specialty:  Cardiology   Contact information:   [address] [place] [address] [phone]        The results of significant diagnostics from this hospitalization (including imaging, microbiology, ancillary and laboratory) are listed below for reference.    Significant Diagnostic Studies: Dg Chest 2 View  07/09/2013   *RADIOLOGY REPORT*  Clinical Data: Madison Henson mid to lower chest pain, history of smoking, hypertension, lung cancer  CHEST - 2 VIEW  Comparison: 03/11/2013; chest CT - 03/11/2013  Findings: Grossly unchanged cardiac silhouette and mediastinal contours.  Stable postsurgical change of the right hilum.  There is mild tortuosity the thoracic aorta.  Lungs are hyperexpanded with flattening of bilateral hemidiaphragms.  No focal airspace opacity. No pleural effusion or pneumothorax.  No evidence of edema. Unchanged bones.  IMPRESSION: 1.  Hyperexpanded lungs without acute cardiopulmonary disease. 2.  Postsurgical change of the right hilum.   Original Report Authenticated By: Tacey Ruiz, MD   Ct Angio Chest Pe W/cm &/or Wo Cm  07/09/2013   *RADIOLOGY REPORT*  Clinical Data:  Epigastric abdominal pain and mid right sternal pain, history of AAA, evaluate for pulmonary embolism  CT ANGIOGRAPHY CHEST CT ABDOMEN AND PELVIS WITH CONTRAST  Technique:  Multidetector CT imaging of the chest was performed using the standard protocol during bolus administration of intravenous contrast.  Multiplanar CT image reconstructions including  MIPs were obtained to evaluate the vascular anatomy. Multidetector CT imaging of the abdomen and pelvis was performed using the standard protocol during bolus administration of intravenous contrast.  Contrast: OMNIPAQUE IOHEXOL 350 MG/ML SOLN  Comparison:  CT abdomen pelvis - 03/13/2013; CT the chest, abdomen and pelvis - 03/11/2013; chest CT - 06/21/2012  CTA CHEST  Vascular Findings:  There is adequate opacification of the pulmonary arterial system of the main pulmonary artery measuring 450 HU. There are no discrete filling defect within the pulmonary arterial system to suggest pulmonary embolism.  Borderline large caliber of the main pulmonary artery measuring 35 mm in diameter.  Normal heart size.  Coronary artery calcifications.  No pericardial effusion.  Rather extensive atherosclerotic plaque within a normal caliber thoracic  aorta.  Bovine configuration of the aortic arch incidentally noted.  No definite periaortic stranding.  Review of the MIP images confirms the above findings.  ------------------------------------------------  Nonvascular findings:  Grossly stable postsurgical changes of the right lower lobe and right hilum.  Nodular soft tissue adjacent to the right lower lobe suture line is grossly unchanged since the 08/20 13 chest CT. Again favored to represent atelectasis or scar.  No new focal airspace opacities.  No pleural effusion or pneumothorax.  The central pulmonary airways are patent.  Mild centrilobular emphysematous change.  Shoddy subcarinal nodal conglomeration is not enlarged by CT criteria with index of lung aeration measuring 9 mm in short axis diameter (image 47, series four).  No mediastinal, hilar or axillary lymphadenopathy.  No acute or aggressive osseous abnormalities.  IMPRESSION:  1.  No acute cardiopulmonary disease.  Specifically, no evidence of pulmonary embolism. 2.  Grossly stable postsurgical change of the right hilum and right lower lobe with associated nodular  thickening along the right lower lobe suture line, similar to the 06/2012 examination and favored to represent residual atelectasis or scar.  ------------------------------------------------------------------- -  CT ABDOMEN AND PELVIS  Findings:  Normal hepatic contour with mild cleft involving the posterior aspect the right lobe of the liver.  No discrete hepatic lesions. Post cholecystectomy.  No intra or extrahepatic biliary duct dilatation.  No ascites.  There is symmetric enhancement and excretion of the bilateral kidneys.  The left kidney appears slightly atrophic in comparison to the right, and particularly involving the inferior pole of the left kidney.  There is an approximately 1.5 cm hypoattenuating (8 HU) cyst within the anterior aspect the inferior pole left kidney (axial image 13, series seven).  Additional bilateral sub centimeter hypoattenuating lesions too small to accurately characterize the favored to represent additional renal cysts.  No definite renal stones on the post contrast examination.  No urinary obstruction or perinephric stranding.  Normal appearance of the bilateral adrenal glands and spleen.  The pancreas is largely fatty replaced.  Scattered colonic diverticulosis without evidence of diverticulitis.  The bowel otherwise normal in course and caliber without wall thickening or evidence of obstruction.  Normal appearance of the appendix.  No pneumoperitoneum, pneumatosis or portal venous gas.  Grossly unchanged size of known infrarenal abdominal aortic aneurysm measuring approximately 37 mm in greatest oblique short axis axial dimension (image 49, series 2) and approximately 39 mm in greatest oblique coronal dimension (coronal image 57, series five).  There is grossly unchanged minimal to moderate amount of crescentic mural thrombus primarily involving the anterior aspect of the aneurysm sac.  No definite periaortic stranding.  The abdominal aorta is noted to taper to a near normal  caliber above the aortic bifurcation.  There are extensive atherosclerotic plaque involving the origin of the proximal aspect of the major branch vessels of the abdominal aorta, in particular, involving the origin and proximal aspect of the SMA.  The major branch vessels of the abdominal aorta appear patent on this non CTA examination.  Scattered shoddy retroperitoneal lymph nodes are not enlarged by CT criteria.  No retroperitoneal, mesenteric, pelvic or inguinal lymphadenopathy.  Post hysterectomy.  No discrete adnexal lesion.  No free fluid in the pelvis.  No acute or aggressive osseous abnormalities.  Mild to moderate multilevel lumbar spine degenerative change.  IMPRESSION: 1.  No acute findings within the abdomen or pelvis.  Specifically, no enteric or urinary obstruction. 2.  Grossly unchanged size of known infrarenal abdominal aortic aneurysm, measuring  approximately 3.7 cm in greatest short axis axial diameter and again noted to contain a moderate amount of crescentic mural thrombus. 3.  Colonic diverticulosis without evidence of diverticulitis. 4.  Grossly unchanged mild asymmetric atrophy of the left kidney.   Original Report Authenticated By: Tacey Ruiz, MD   Ct Abdomen Pelvis W Contrast  07/09/2013   *RADIOLOGY REPORT*  Clinical Data:  Epigastric abdominal pain and mid right sternal pain, history of AAA, evaluate for pulmonary embolism  CT ANGIOGRAPHY CHEST CT ABDOMEN AND PELVIS WITH CONTRAST  Technique:  Multidetector CT imaging of the chest was performed using the standard protocol during bolus administration of intravenous contrast.  Multiplanar CT image reconstructions including MIPs were obtained to evaluate the vascular anatomy. Multidetector CT imaging of the abdomen and pelvis was performed using the standard protocol during bolus administration of intravenous contrast.  Contrast: OMNIPAQUE IOHEXOL 350 MG/ML SOLN  Comparison:  CT abdomen pelvis - 03/13/2013; CT the chest, abdomen and  pelvis - 03/11/2013; chest CT - 06/21/2012  CTA CHEST  Vascular Findings:  There is adequate opacification of the pulmonary arterial system of the main pulmonary artery measuring 450 HU. There are no discrete filling defect within the pulmonary arterial system to suggest pulmonary embolism.  Borderline large caliber of the main pulmonary artery measuring 35 mm in diameter.  Normal heart size.  Coronary artery calcifications.  No pericardial effusion.  Rather extensive atherosclerotic plaque within a normal caliber thoracic aorta.  Bovine configuration of the aortic arch incidentally noted.  No definite periaortic stranding.  Review of the MIP images confirms the above findings.  ------------------------------------------------  Nonvascular findings:  Grossly stable postsurgical changes of the right lower lobe and right hilum.  Nodular soft tissue adjacent to the right lower lobe suture line is grossly unchanged since the 08/20 13 chest CT. Again favored to represent atelectasis or scar.  No new focal airspace opacities.  No pleural effusion or pneumothorax.  The central pulmonary airways are patent.  Mild centrilobular emphysematous change.  Shoddy subcarinal nodal conglomeration is not enlarged by CT criteria with index of lung aeration measuring 9 mm in short axis diameter (image 47, series four).  No mediastinal, hilar or axillary lymphadenopathy.  No acute or aggressive osseous abnormalities.  IMPRESSION:  1.  No acute cardiopulmonary disease.  Specifically, no evidence of pulmonary embolism. 2.  Grossly stable postsurgical change of the right hilum and right lower lobe with associated nodular thickening along the right lower lobe suture line, similar to the 06/2012 examination and favored to represent residual atelectasis or scar.  ------------------------------------------------------------------- -  CT ABDOMEN AND PELVIS  Findings:  Normal hepatic contour with mild cleft involving the posterior aspect the  right lobe of the liver.  No discrete hepatic lesions. Post cholecystectomy.  No intra or extrahepatic biliary duct dilatation.  No ascites.  There is symmetric enhancement and excretion of the bilateral kidneys.  The left kidney appears slightly atrophic in comparison to the right, and particularly involving the inferior pole of the left kidney.  There is an approximately 1.5 cm hypoattenuating (8 HU) cyst within the anterior aspect the inferior pole left kidney (axial image 13, series seven).  Additional bilateral sub centimeter hypoattenuating lesions too small to accurately characterize the favored to represent additional renal cysts.  No definite renal stones on the post contrast examination.  No urinary obstruction or perinephric stranding.  Normal appearance of the bilateral adrenal glands and spleen.  The pancreas is largely fatty replaced.  Scattered  colonic diverticulosis without evidence of diverticulitis.  The bowel otherwise normal in course and caliber without wall thickening or evidence of obstruction.  Normal appearance of the appendix.  No pneumoperitoneum, pneumatosis or portal venous gas.  Grossly unchanged size of known infrarenal abdominal aortic aneurysm measuring approximately 37 mm in greatest oblique short axis axial dimension (image 49, series 2) and approximately 39 mm in greatest oblique coronal dimension (coronal image 57, series five).  There is grossly unchanged minimal to moderate amount of crescentic mural thrombus primarily involving the anterior aspect of the aneurysm sac.  No definite periaortic stranding.  The abdominal aorta is noted to taper to a near normal caliber above the aortic bifurcation.  There are extensive atherosclerotic plaque involving the origin of the proximal aspect of the major branch vessels of the abdominal aorta, in particular, involving the origin and proximal aspect of the SMA.  The major branch vessels of the abdominal aorta appear patent on this non CTA  examination.  Scattered shoddy retroperitoneal lymph nodes are not enlarged by CT criteria.  No retroperitoneal, mesenteric, pelvic or inguinal lymphadenopathy.  Post hysterectomy.  No discrete adnexal lesion.  No free fluid in the pelvis.  No acute or aggressive osseous abnormalities.  Mild to moderate multilevel lumbar spine degenerative change.  IMPRESSION: 1.  No acute findings within the abdomen or pelvis.  Specifically, no enteric or urinary obstruction. 2.  Grossly unchanged size of known infrarenal abdominal aortic aneurysm, measuring approximately 3.7 cm in greatest short axis axial diameter and again noted to contain a moderate amount of crescentic mural thrombus. 3.  Colonic diverticulosis without evidence of diverticulitis. 4.  Grossly unchanged mild asymmetric atrophy of the left kidney.   Original Report Authenticated By: Tacey Ruiz, MD    Microbiology: No results found for this or any previous visit (from the past 240 hour(s)).   Labs: Basic Metabolic Panel:  Recent Labs Lab 07/09/13 1718 07/10/13 0755  NA 142 142  K 3.1* 3.4*  CL 105 105  CO2 28 27  GLUCOSE 113* 91  BUN 9 8  CREATININE 0.95 0.92  CALCIUM 8.8 8.7   Liver Function Tests:  Recent Labs Lab 07/09/13 1718  AST 9  ALT 6  ALKPHOS 97  BILITOT 0.3  PROT 6.3  ALBUMIN 3.1*    Recent Labs Lab 07/09/13 1718  LIPASE 23   No results found for this basename: AMMONIA,  in the last 168 hours CBC:  Recent Labs Lab 07/09/13 1718 07/10/13 0755  WBC 7.5 7.1  NEUTROABS 4.8  --   HGB 13.4 13.3  HCT 40.0 39.9  MCV 91.5 91.3  PLT 206 210   Cardiac Enzymes:  Recent Labs Lab 07/09/13 1718 07/10/13 0150 07/10/13 0755  TROPONINI <0.30 <0.30 <0.30   BNP: BNP (last 3 results) No results found for this basename: PROBNP,  in the last 8760 hours CBG:  Recent Labs Lab 07/10/13 0022 07/10/13 0754  GLUCAP 102* 82       Signed:  Rhetta Mura  [facility] 07/10/2013, 9:25  AM

## 2013-07-10 NOTE — ED Provider Notes (Signed)
I saw and evaluated the patient, reviewed the resident's note and I agree with the findings and plan.   Patient w/ vague abd/chest pain. Given hx of AAA and pleuritic pain, CT was evaluated for PE and AAA. Both neg. Given her CAD she will need further w/u for ACS.  Audree Camel, MD 07/10/13 1048

## 2013-07-11 ENCOUNTER — Telehealth: Payer: Self-pay

## 2013-07-11 MED ORDER — LINACLOTIDE 145 MCG PO CAPS: 145.0000 ug | ORAL_CAPSULE | Freq: Every day | ORAL | Status: DC

## 2013-07-11 NOTE — Telephone Encounter (Signed)
Tried to do a PA for Amitiza. PA was denied. Linzess is on formulary.

## 2013-07-11 NOTE — Telephone Encounter (Signed)
RX for Linzess. Please give her voucher.

## 2013-07-12 NOTE — Telephone Encounter (Signed)
Pt is aware and asked that I mail voucher to her.  Voucher is in the mail.

## 2013-07-13 ENCOUNTER — Ambulatory Visit (HOSPITAL_COMMUNITY)
Admission: RE | Admit: 2013-07-13 | Discharge: 2013-07-13 | Disposition: A | Discharge status: Home / Self Care | Payer: Medicare Other | Source: Ambulatory Visit | Attending: Gastroenterology | Admitting: Gastroenterology

## 2013-07-13 DIAGNOSIS — K224 Dyskinesia of esophagus: Secondary | ICD-10-CM | POA: Insufficient documentation

## 2013-07-13 DIAGNOSIS — R1310 Dysphagia, unspecified: Secondary | ICD-10-CM | POA: Insufficient documentation

## 2013-07-18 ENCOUNTER — Encounter: Payer: Medicare Other | Admitting: Cardiovascular Disease

## 2013-07-25 NOTE — Telephone Encounter (Signed)
PLEASE CALL PT. HER DIFFICULTIES SWALLOWING IS DUE TO A WEAK ESOPHAGUS. SHE DOES NOT NEED AN ENDOSCOPY. SHE SHOULD FOLLOW A LOW FAT SOFT MECHANICAL DIET.  MEATS SHOULD BE CHOPPED OR GROUND ONLY. SHE SHOULD NOT EAT CHUNKS OF ANYTHING. CONTINUE LINZESS. OPV IN 3 MOS W/ SLF E30 DYSPHAGIA/CONSTIPATION.

## 2013-07-27 NOTE — Telephone Encounter (Signed)
Pt aware of results. Pt stomach is hurting her when she eats.Please advise

## 2013-07-27 NOTE — Telephone Encounter (Signed)
PLEASE CALL PT. She most likely has abdominal pain due to constipation. Continue Linzess.Drink water. Continue Fiber supplements.

## 2013-07-30 ENCOUNTER — Telehealth: Payer: Self-pay

## 2013-07-30 ENCOUNTER — Other Ambulatory Visit: Payer: Self-pay | Admitting: Gastroenterology

## 2013-07-30 DIAGNOSIS — K551 Chronic vascular disorders of intestine: Secondary | ICD-10-CM

## 2013-07-30 DIAGNOSIS — R634 Abnormal weight loss: Secondary | ICD-10-CM

## 2013-07-30 NOTE — Telephone Encounter (Signed)
Patient now stating she has noticed black stool. I spoke in depth with her, and she is a poor historian. States she feels better today than she did the weekend. Recommend stat CBC; she is unable to do secondary to transportation issues. Offered urgent visit tomorrow or Wednesday, but she is still unable to do.   However, I have an 11:30 available on Thursday. She will be calling back to see if she can make this. Unclear if having true melena. Discussed signs/symptoms that would prompt emergent evaluation. Will check CBC on Thursday.   Soledad Gerlach: can we go ahead and set up an appt with IR ASAP per Dr. Darrick Penna' recommendations.

## 2013-07-30 NOTE — Telephone Encounter (Signed)
Let's increase Linzess to 290 mcg daily. May be that constipation is playing a role here.  If no improvement, recommend CT. Have her call back in a few days or call if worsening despite Linzess.

## 2013-07-30 NOTE — Telephone Encounter (Signed)
Pt is aware of the increase of the Linzess to 290 mcg once a day. Samples at the front for pick up. ( She is aware not to take the 145 mcg).

## 2013-07-30 NOTE — Telephone Encounter (Signed)
Referral has been sent to Tammy in IR at [facility] and she will notify Mrs. Cirrincione with a date & time

## 2013-07-30 NOTE — Telephone Encounter (Signed)
On the schedule

## 2013-07-30 NOTE — Telephone Encounter (Signed)
Pt has a functional gut disorder. Her nausea is most likely due to gastroparesis. 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 Agree with increasing Linzess. Refer pt to IR for chronic mesenteric ischemia/ weight loss/consider stent ASAP.

## 2013-07-30 NOTE — Telephone Encounter (Signed)
T/C from Irving Shows, caseworker from [facility]. She said she wanted to report the vague symptoms that the pt reports constantly to her. She said that pt says she has lost 12 lbs. Everything she eats makes her nauseated. Said she complains constantly of abdominal pain and says that she is taking Amitiza and Linzess and it is not helping her constipation. ( I checked chart and told her pt is to take the Linzess 145 mcg daily on empty stomach. She is not on both.She said she will let pt and daughter know). Raynelle Fanning also said that pt says when she has BM it is like the size of a pencil and kind of weird sizes. Please advise! Routing to Gerrit Halls, NP in Dr. Evelina Dun absence.

## 2013-07-30 NOTE — Telephone Encounter (Signed)
Please put in my 11:30 urgent on Thursday.

## 2013-07-30 NOTE — Telephone Encounter (Signed)
#  12 samples of the Linzess 290 mcg at front for pick up.

## 2013-07-31 NOTE — Telephone Encounter (Signed)
Sorry, the message was left on Julie's VM for a return call.

## 2013-07-31 NOTE — Telephone Encounter (Signed)
Reminder in epic °

## 2013-07-31 NOTE — Telephone Encounter (Signed)
LMOM to call.

## 2013-07-31 NOTE — Telephone Encounter (Signed)
Madison Henson returned call and was informed of pt's appt Thurs, referral to IR, and the Linzess 290 mcg.

## 2013-07-31 NOTE — Telephone Encounter (Signed)
Pt aware. See separate phone note of 07/30/2013.

## 2013-08-02 ENCOUNTER — Ambulatory Visit (INDEPENDENT_AMBULATORY_CARE_PROVIDER_SITE_OTHER): Payer: Medicare Other | Admitting: Gastroenterology

## 2013-08-02 ENCOUNTER — Encounter: Payer: Self-pay | Admitting: Gastroenterology

## 2013-08-02 VITALS — BP 121/66 | HR 66 | Temp 97.0°F | Ht 69.0 in | Wt 187.0 lb

## 2013-08-02 DIAGNOSIS — R109 Unspecified abdominal pain: Secondary | ICD-10-CM

## 2013-08-02 DIAGNOSIS — K921 Melena: Secondary | ICD-10-CM | POA: Insufficient documentation

## 2013-08-02 MED ORDER — LINACLOTIDE 290 MCG PO CAPS: 290.0000 ug | ORAL_CAPSULE | Freq: Every day | ORAL | Status: DC

## 2013-08-02 NOTE — Patient Instructions (Addendum)
Please have the blood work done today. We will call with the results.  Keep the appointment in [facility] next week.   After the blood work is resulted, we will decide on doing an upper endoscopy in the near future.   Start taking Linzess 290 mcg (ONE CAPSULE) each morning, 30 minutes before breakfast. I increased the dosage, sent to pharmacy, and we provided samples.

## 2013-08-02 NOTE — Assessment & Plan Note (Signed)
Per patient. Continues to insist on black, tarry stool intermittently for past month. Hgb 13 late August. Rectal exam with light brown stool and heme negative. Unsure if she is having true melena, and I have asked her to have a CBC stat today. Last EGD in 2012; repeat EGD if evidence of anemia and/or further possible melena. May need to consider capsule study.

## 2013-08-02 NOTE — Assessment & Plan Note (Signed)
Chronic. Keep appt with IR next week to evaluate for possible stent placement. 1 vessel disease on CTA, but patient's clinical symptoms are convincing. Constipation exacerbating presentation as well. Last colonoscopy in 2011. If no improvement with Linzess 290 mcg and persistent constipation, consider TCS due to +FH of colon cancer.

## 2013-08-02 NOTE — Progress Notes (Signed)
Referring Provider: [name], MD Primary Care Physician:  [name], MD Primary GI: Dr. Darrick Penna   Chief Complaint  Patient presents with  . Gastrophageal Reflux    HPI:   79 year old female presents today as an urgent work-in due to possible melena. She was unable to obtain a CBC prior to visit due to transportation issues. She actually was not able to be seen until several days after calling due to transportation. Hx significant for chronic constipation, dysphagia due to esophageal dysmotility, GERD gastroparesis, chronic abdominal pain with CTA on file with one vessel disease. Pending appointment in [place] next week (9/23) to see if stenting is a consideration due to SMA stenosis.   Last EGD 2012. Reported black, tarry stool for about a month. Small, pencil-like stool. Reports constipation. Lower abdominal discomfort, "cuts in two" all the time with cereal, oatmeal. Worse with eating. "anything I eat, hurts". BM usually every other day but very small amounts. Linzess 145 mcg. Hasn't started Linzess 290 mcg. No NSAIDs, aspirin powders.   Past Medical History  Diagnosis Date  . Coronary artery disease     post diaphragmatic wall infarction on 06-2007,due to stent thrombosis   . Chronic obstructive pulmonary disease   . Tobacco abuse   . Hypertension   . Hyperlipidemia   . GERD (gastroesophageal reflux disease)   . Anxiety disorder   . Depression   . Hypothyroidism   . Hx of hysterectomy   . AAA (abdominal aortic aneurysm)   . Pulmonary nodule 04/2011    neg bx, followed by Dr. Edwyna Shell, may still be cancer, being followed with CTs.  . Esophageal dysmotility     Two BPE since 03/2011-->Moderate impairment of esophageal motility.Vallecular residuals. Laryngeal penetration  . Anemia   . Diabetes mellitus     with gastroparesis 70% retention at 2 hours  . Cancer   . Lung cancer   . CHF (congestive heart failure)   . Myocardial infarction     Past Surgical History  Procedure  Laterality Date  . Cholecystectomy    . Cataract surg    . Bladder surgery      tack  . Kidney stone surgery    . Coronary stent placement  2004    2  . Coronary stent placement  2008    2  . S/p hysterectomy    . Neck surgery    . Esophagogastroduodenoscopy  5/08    normal esophagus, No H.Pylori  . Egd/bravo  07/2008    On Nexium BID. Day 1 DMSTR Score: 0.7, day 2: DMSTR score: 32, uncontrolled GERD, No H. Pylori  . Hbt  2009    negative  . Colonoscopy  05/2010    tortuous sigm colon, sigm tics, multiple simple adenomas, hemorrhoids, next TCS 10-15 yrs per Dr. Darrick Penna. Previous h/o tubular adenomas in 2008.   Marland Kitchen Esophagogastroduodenoscopy  05/2010    small hh, streaky erythema in body/antrum. Duodenal bx negative.   . Esophagogastroduodenoscopy  06/22/2011    mild gastritis/ring in the distal esophagus, savary dilation. Bx reactive gastropathy, No H.pylori  . Savory dilation  06/22/2011    Procedure: SAVORY DILATION;  Surgeon: Arlyce Harman, MD;  Location: [facility];  Service: Endoscopy;  Laterality: N/A;  [name] dilation  06/22/2011    Procedure: [name] DILATION;  Surgeon: Arlyce Harman, MD;  Location: [facility];  Service: Endoscopy;  Laterality: N/A;  . Video brochoscopy with electromagnetic navigaton  05/10/2011    [name]  .  Right lower lobe superior segmentectomy  October 2012  . Hysterectomy at age 77    . Cardiac catheterization    . Abdominal hysterectomy      Current Outpatient Prescriptions  Medication Sig Dispense Refill  . albuterol (PROAIR HFA) 108 (90 BASE) MCG/ACT inhaler Inhale 2 puffs into the lungs every 6 (six) hours as needed. For wheezing      . ALPRAZolam (XANAX) 1 MG tablet Take 0.5-1 mg by mouth 2 (two) times daily. Take 0.5mg  in the morning & 1mg  at night      . amLODipine (NORVASC) 5 MG tablet Take 5 mg by mouth daily.      Marland Kitchen atorvastatin (LIPITOR) 20 MG tablet Take 20 mg by mouth daily.        . clopidogrel (PLAVIX) 75 MG tablet Take  75 mg by mouth daily.      . ferrous sulfate 325 (65 FE) MG tablet Take 325 mg by mouth at bedtime.       . gabapentin (NEURONTIN) 300 MG capsule Take 300-600 mg by mouth 2 (two) times daily. Take 300mg  in the morning & 600mg  at bedtime      . HYDROcodone-acetaminophen (NORCO) 10-325 MG per tablet Take 1 tablet by mouth every 6 (six) hours as needed for pain. For pain      . JANUVIA 50 MG tablet Take 50 mg by mouth daily.       Marland Kitchen levothyroxine (SYNTHROID, LEVOTHROID) 125 MCG tablet Take 125 mcg by mouth daily before breakfast.       . Linaclotide (LINZESS) 145 MCG CAPS capsule Take 1 capsule (145 mcg total) by mouth daily. On empty stomach.  30 capsule  2  . pantoprazole (PROTONIX) 40 MG tablet Take 40 mg by mouth 2 (two) times daily.      . ranitidine (ZANTAC) 150 MG tablet Take 75 mg by mouth at bedtime as needed. For heartburn      . Vitamin D, Ergocalciferol, (DRISDOL) 50000 UNITS CAPS Take 50,000 Units by mouth every 7 (seven) days. Takes on Monday       No current facility-administered medications for this visit.    Allergies as of 08/02/2013 - Review Complete 08/02/2013  Allergen Reaction Noted  . Sulfonamide derivatives Swelling and Rash     Family History  Problem Relation Age of Onset  . Coronary artery disease      family hx of  . Colon cancer Sister 49  . Colon cancer Daughter   . Prostate cancer      family hx of  . Lung cancer      family hx of  . Ovarian cancer      family hx of  . Arthritis      family hx of  . Other      family hx of chronic respiratory condition    History   Social History  . Marital Status: Widowed    Spouse Name: N/A    Number of Children: N/A  . Years of Education: N/A   Social History Main Topics  . Smoking status: Current Every Day Smoker -- 0.50 packs/day    Types: Cigarettes  . Smokeless tobacco: None     Comment: She smokes 3 cigarretes daily  . Alcohol Use: No  . Drug Use: No  . Sexual Activity: No   Other Topics  Concern  . None   Social History Narrative  . None    Review of Systems: Negative unless mentioned in HPI.  Physical Exam: BP 121/66  Pulse 66  Temp(Src) 97 F (36.1 C) (Oral)  Ht 5\' 9"  (1.753 m)  Wt 187 lb (84.823 kg)  BMI 27.6 kg/m2 General:   Alert and oriented. No distress noted. Pleasant and cooperative.  Head:  Normocephalic and atraumatic. Eyes:  Conjuctiva clear without scleral icterus. Heart:  S1, S2 present without murmurs, rubs, or gallops. Regular rate and rhythm. Abdomen:  +BS, soft, mild TTP lower abdomen and non-distended. No rebound or guarding. No HSM or masses noted. Rectal: small non-thrombosed external hemorrhoids, internal exam without any mass, strictures, stool light brown. HEME NEGATIVE.  Msk:  Symmetrical without gross deformities. Normal posture. Extremities:  Without edema. Neurologic:  Alert and  oriented x4;  grossly normal neurologically. Skin:  Intact without significant lesions or rashes. Psych:  Alert and cooperative. Normal mood and affect.   Lab Results  Component Value Date   WBC 7.1 07/10/2013   HGB 13.3 07/10/2013   HCT 39.9 07/10/2013   MCV 91.3 07/10/2013   PLT 210 07/10/2013

## 2013-08-02 NOTE — Progress Notes (Signed)
CC'd to PCP 

## 2013-08-07 ENCOUNTER — Ambulatory Visit
Admission: RE | Admit: 2013-08-07 | Discharge: 2013-08-07 | Disposition: A | Discharge status: Home / Self Care | Payer: Medicare Other | Source: Ambulatory Visit | Attending: Gastroenterology | Admitting: Gastroenterology

## 2013-08-07 DIAGNOSIS — R634 Abnormal weight loss: Secondary | ICD-10-CM

## 2013-08-07 DIAGNOSIS — K551 Chronic vascular disorders of intestine: Secondary | ICD-10-CM

## 2013-08-08 ENCOUNTER — Telehealth: Payer: Self-pay | Admitting: Gastroenterology

## 2013-08-08 NOTE — Telephone Encounter (Signed)
As we have ruled out mesenteric ischemia, need to go ahead and proceed with EGD with [facility] due to reports of possible melena. It is encouraging her Hgb is holding steady. It is unclear if this is true melena or not. I am glad to hear that her constipation is improving. Hold off on colonoscopy now. May ultimately need in the future, but not currently.

## 2013-08-08 NOTE — Telephone Encounter (Signed)
I called pt and she said she went to [facility] yesterday and saw IR. Said she is more confused now than what she was before. York Spaniel he said that he was not licensed to do procedure that she needed and she is confused. She wants to know if Tobi Bastos or Dr. Darrick Penna has heard from them. Please advise!

## 2013-08-08 NOTE — Telephone Encounter (Signed)
Pt called and was informed of the info. She said she is taking the Linzess 290 mcg daily and it is helping. She has a BM on average of once every 2 days. She is most concerned about her abdominal pain around her belly button. She said it hurts if she eats and hurts if she does not eat. When she first gets up in the AM and puts her jeans on  They are loose and by lunch time they are tight. Please advise!

## 2013-08-08 NOTE — Telephone Encounter (Signed)
Pt called asking to speak with nurse. I told her that the nurses were busy with the patients at the moment and I would take at message. She couldn't really explain to me what she was needing. She mentioned getting papers from another doctor and was asking about a tube. I asked her if it was regarding a referral or her medicines, but she didn't know. [phone]

## 2013-08-08 NOTE — Telephone Encounter (Signed)
How is constipation since starting Linzess 290 mcg daily?  She needs an EGD, but she may also need a colonoscopy as well if no improvement with Linzess.  No evidence of mesenteric ischemia per IR.

## 2013-08-08 NOTE — Telephone Encounter (Signed)
Called and informed pt.  

## 2013-08-08 NOTE — Progress Notes (Signed)
As we have ruled out mesenteric ischemia, need to go ahead and proceed with EGD with [facility] due to reports of possible melena. It is encouraging her Hgb is holding steady. It is unclear if this is true melena or not. Constipation improving with Linzess 290 mcg. Hold off on colonoscopy now. May ultimately need in the future, but not currently.

## 2013-08-09 ENCOUNTER — Other Ambulatory Visit: Payer: Self-pay | Admitting: Gastroenterology

## 2013-08-09 ENCOUNTER — Encounter (HOSPITAL_COMMUNITY): Payer: Self-pay | Admitting: Pharmacy Technician

## 2013-08-09 DIAGNOSIS — K921 Melena: Secondary | ICD-10-CM

## 2013-08-09 NOTE — Telephone Encounter (Signed)
Madison Henson is scheduled for EGD w/[facility] on 09/30 at 2:45 and she is aware

## 2013-08-14 ENCOUNTER — Ambulatory Visit (HOSPITAL_COMMUNITY)
Admission: RE | Admit: 2013-08-14 | Discharge: 2013-08-14 | Disposition: A | Discharge status: Home / Self Care | Payer: Medicare Other | Source: Ambulatory Visit | Attending: Gastroenterology | Admitting: Gastroenterology

## 2013-08-14 ENCOUNTER — Encounter (HOSPITAL_COMMUNITY): Payer: Self-pay | Admitting: *Deleted

## 2013-08-14 ENCOUNTER — Encounter (HOSPITAL_COMMUNITY)
Admission: RE | Disposition: A | Discharge status: Home / Self Care | Payer: Self-pay | Source: Ambulatory Visit | Attending: Gastroenterology

## 2013-08-14 DIAGNOSIS — K5710 Diverticulosis of small intestine without perforation or abscess without bleeding: Secondary | ICD-10-CM

## 2013-08-14 DIAGNOSIS — I10 Essential (primary) hypertension: Secondary | ICD-10-CM | POA: Insufficient documentation

## 2013-08-14 DIAGNOSIS — R1310 Dysphagia, unspecified: Secondary | ICD-10-CM

## 2013-08-14 DIAGNOSIS — R1013 Epigastric pain: Secondary | ICD-10-CM | POA: Insufficient documentation

## 2013-08-14 DIAGNOSIS — Z01812 Encounter for preprocedural laboratory examination: Secondary | ICD-10-CM | POA: Insufficient documentation

## 2013-08-14 DIAGNOSIS — K2960 Other gastritis without bleeding: Secondary | ICD-10-CM

## 2013-08-14 DIAGNOSIS — K319 Disease of stomach and duodenum, unspecified: Secondary | ICD-10-CM | POA: Insufficient documentation

## 2013-08-14 DIAGNOSIS — J4489 Other specified chronic obstructive pulmonary disease: Secondary | ICD-10-CM | POA: Insufficient documentation

## 2013-08-14 DIAGNOSIS — K921 Melena: Secondary | ICD-10-CM

## 2013-08-14 DIAGNOSIS — E119 Type 2 diabetes mellitus without complications: Secondary | ICD-10-CM | POA: Insufficient documentation

## 2013-08-14 DIAGNOSIS — J449 Chronic obstructive pulmonary disease, unspecified: Secondary | ICD-10-CM | POA: Insufficient documentation

## 2013-08-14 HISTORY — PX: ESOPHAGOGASTRODUODENOSCOPY: SHX5428

## 2013-08-14 LAB — GLUCOSE, CAPILLARY: Glucose-Capillary: 85 mg/dL (ref 70–99)

## 2013-08-14 SURGERY — EGD (ESOPHAGOGASTRODUODENOSCOPY)
Anesthesia: Moderate Sedation

## 2013-08-14 MED ORDER — BUTAMBEN-TETRACAINE-BENZOCAINE 2-2-14 % EX AERO
INHALATION_SPRAY | CUTANEOUS | Status: DC | PRN
  Administered 2013-08-14: 2 via TOPICAL

## 2013-08-14 MED ORDER — SODIUM CHLORIDE 0.9 % IV SOLN: INTRAVENOUS | Status: DC

## 2013-08-14 MED ORDER — MEPERIDINE HCL 100 MG/ML IJ SOLN
INTRAMUSCULAR | Status: DC | PRN
  Administered 2013-08-14 (×2): 25 mg via INTRAVENOUS

## 2013-08-14 MED ORDER — MINERAL OIL PO OIL
TOPICAL_OIL | ORAL | Status: AC
  Filled 2013-08-14: qty 30

## 2013-08-14 MED ORDER — MIDAZOLAM HCL 5 MG/5ML IJ SOLN
INTRAMUSCULAR | Status: AC
  Filled 2013-08-14: qty 10

## 2013-08-14 MED ORDER — MEPERIDINE HCL 100 MG/ML IJ SOLN
INTRAMUSCULAR | Status: AC
  Filled 2013-08-14: qty 1

## 2013-08-14 MED ORDER — LINACLOTIDE 290 MCG PO CAPS: ORAL_CAPSULE | ORAL | Status: DC

## 2013-08-14 MED ORDER — STERILE WATER FOR IRRIGATION IR SOLN
Status: DC | PRN
  Administered 2013-08-14: 15:00:00

## 2013-08-14 MED ORDER — LINACLOTIDE 145 MCG PO CAPS: ORAL_CAPSULE | ORAL | Status: DC

## 2013-08-14 MED ORDER — MIDAZOLAM HCL 5 MG/5ML IJ SOLN
INTRAMUSCULAR | Status: DC | PRN
  Administered 2013-08-14 (×2): 2 mg via INTRAVENOUS

## 2013-08-14 NOTE — H&P (Addendum)
Primary Care Physician:  [name], MD Primary Gastroenterologist:  Dr. Darrick Penna  Pre-Procedure History & Physical: HPI:  Madison Henson is a 79 y.o. female here for DYSPHAGIA/MELENA/CONSTIPATION.  Past Medical History  Diagnosis Date  . Coronary artery disease     post diaphragmatic wall infarction on 06-2007,due to stent thrombosis   . Chronic obstructive pulmonary disease   . Tobacco abuse   . Hypertension   . Hyperlipidemia   . GERD (gastroesophageal reflux disease)   . Anxiety disorder   . Depression   . Hypothyroidism   . Hx of hysterectomy   . AAA (abdominal aortic aneurysm)   . Pulmonary nodule 04/2011    neg bx, followed by Dr. Edwyna Shell, may still be cancer, being followed with CTs.  . Esophageal dysmotility     Two BPE since 03/2011-->Moderate impairment of esophageal motility.Vallecular residuals. Laryngeal penetration  . Anemia   . Diabetes mellitus     with gastroparesis 70% retention at 2 hours  . Cancer   . Lung cancer   . CHF (congestive heart failure)   . Myocardial infarction     Past Surgical History  Procedure Laterality Date  . Cholecystectomy    . Cataract surg    . Bladder surgery      tack  . Kidney stone surgery    . Coronary stent placement  2004    2  . Coronary stent placement  2008    2  . S/p hysterectomy    . Neck surgery    . Esophagogastroduodenoscopy  5/08    normal esophagus, No H.Pylori  . Egd/bravo  07/2008    On Nexium BID. Day 1 DMSTR Score: 0.7, day 2: DMSTR score: 32, uncontrolled GERD, No H. Pylori  . Hbt  2009    negative  . Colonoscopy  05/2010    tortuous sigm colon, sigm tics, multiple simple adenomas, hemorrhoids, next TCS 10-15 yrs per Dr. Darrick Penna. Previous h/o tubular adenomas in 2008.   Marland Kitchen Esophagogastroduodenoscopy  05/2010    small hh, streaky erythema in body/antrum. Duodenal bx negative.   . Esophagogastroduodenoscopy  06/22/2011    mild gastritis/ring in the distal esophagus, savary dilation. Bx reactive  gastropathy, No H.pylori  . Savory dilation  06/22/2011    Procedure: SAVORY DILATION;  Surgeon: Arlyce Harman, MD;  Location: [facility];  Service: Endoscopy;  Laterality: N/A;  [name] dilation  06/22/2011    Procedure: [name] DILATION;  Surgeon: Arlyce Harman, MD;  [facility]: [facility];  Service: Endoscopy;  Laterality: N/A;  . Video brochoscopy with electromagnetic navigaton  05/10/2011    [name]  . Right lower lobe superior segmentectomy  October 2012  . Hysterectomy at age 57    . Cardiac catheterization    . Abdominal hysterectomy      Prior to Admission medications   Medication Sig Start Date End Date Taking? Authorizing Provider  albuterol (PROAIR HFA) 108 (90 BASE) MCG/ACT inhaler Inhale 2 puffs into the lungs every 6 (six) hours as needed. For wheezing   Yes Historical Provider, MD  ALPRAZolam Prudy Feeler) 1 MG tablet Take 0.5-1 mg by mouth 2 (two) times daily. Take 0.5mg  in the morning & 1mg  at night   Yes Historical Provider, MD  amLODipine (NORVASC) 5 MG tablet Take 5 mg by mouth daily.   Yes Historical Provider, MD  atorvastatin (LIPITOR) 20 MG tablet Take 20 mg by mouth daily.     Yes Historical Provider, MD  Cholecalciferol (VITAMIN  D PO) Take 1 tablet by mouth daily.   Yes Historical Provider, MD  clopidogrel (PLAVIX) 75 MG tablet Take 75 mg by mouth daily.   Yes Historical Provider, MD  ferrous sulfate 325 (65 FE) MG tablet Take 325 mg by mouth at bedtime.    Yes Historical Provider, MD  gabapentin (NEURONTIN) 300 MG capsule Take 300-600 mg by mouth 2 (two) times daily. Take 300mg  in the morning & 600mg  at bedtime 07/24/12  Yes Historical Provider, MD  JANUVIA 50 MG tablet Take 50 mg by mouth daily.  07/04/12  Yes Historical Provider, MD  levothyroxine (SYNTHROID, LEVOTHROID) 125 MCG tablet Take 125 mcg by mouth daily before breakfast.  06/29/13  Yes Historical Provider, MD  Linaclotide (LINZESS) 290 MCG CAPS capsule Take 1 capsule (290 mcg total) by mouth daily.  08/02/13  Yes Nira Retort, NP  pantoprazole (PROTONIX) 40 MG tablet Take 40 mg by mouth 2 (two) times daily.   Yes Historical Provider, MD  ranitidine (ZANTAC) 150 MG tablet Take 75 mg by mouth at bedtime as needed. For heartburn 01/06/13  Yes Historical Provider, MD    Allergies as of 08/09/2013 - Review Complete 08/09/2013  Allergen Reaction Noted  . Hydrocodone Other (See Comments) 08/09/2013  . Sulfonamide derivatives Swelling and Rash     Family History  Problem Relation Age of Onset  . Coronary artery disease      family hx of  . Colon cancer Sister 15  . Colon cancer Daughter   . Prostate cancer      family hx of  . Lung cancer      family hx of  . Ovarian cancer      family hx of  . Arthritis      family hx of  . Other      family hx of chronic respiratory condition    History   Social History  . Marital Status: Widowed    Spouse Name: N/A    Number of Children: N/A  . Years of Education: N/A   Occupational History  . Not on file.   Social History Main Topics  . Smoking status: Current Every Day Smoker -- 0.50 packs/day    Types: Cigarettes  . Smokeless tobacco: Not on file     Comment: She smokes 3 cigarretes daily  . Alcohol Use: No  . Drug Use: No  . Sexual Activity: No   Other Topics Concern  . Not on file   Social History Narrative  . No narrative on file    Review of Systems: See HPI, otherwise negative ROS   Physical Exam: BP 111/63  Pulse 62  Temp(Src) 98.1 F (36.7 C) (Oral)  Resp 20  SpO2 92% General:   Alert,  pleasant and cooperative in NAD Head:  Normocephalic and atraumatic. Neck:  Supple; Lungs:  Clear throughout to auscultation.    Heart:  Regular rate and rhythm. Abdomen:  Soft, nontender and nondistended. Normal bowel sounds, without guarding, and without rebound.   Neurologic:  Alert and  oriented x4;  grossly normal neurologically.  Impression/Plan:     MELENA/brbpr/dysphagia  PLAN: 1. EGD TODAY

## 2013-08-14 NOTE — Telephone Encounter (Signed)
The patient WAS SEEN IN ENDO. TAKING IRON WITH NL HB. BRBPR DUE TO IH. EGD SHOWS GASTRITIS

## 2013-08-14 NOTE — Op Note (Signed)
[facility] [address] [place], [address]   ENDOSCOPY PROCEDURE REPORT  PATIENT: Madison, Henson  MR#: [id number] BIRTHDATE: 02/21/1928 , 85  yrs. old GENDER: Female  ENDOSCOPIST: Jonette Eva, MD REFFERED QI:ONGE Hall, M.D.  PROCEDURE DATE:  08/14/2013 PROCEDURE:   EGD with biopsy and EGD with dilatation over guidewire   INDICATIONS:1.  dysphagia.   2.  dyspepsia.   3.  epigastric pain. HB < 12 SINCE SEP 2013. CR NL IN 2014. AAA CT AUG 2014: GETTING LARGER SINCE 2012 3.0-->3.9 CM. MEDICATIONS: Demerol 50 mg IV and Versed 4 mg IV TOPICAL ANESTHETIC: Cetacaine Spray  DESCRIPTION OF PROCEDURE:   After the risks benefits and alternatives of the procedure were thoroughly explained, informed consent was obtained.  The EG-[id number] ([id number])  endoscope was introduced through the mouth and advanced to the second portion of the duodenum. The instrument was slowly withdrawn as the mucosa was carefully examined.  Prior to withdrawal of the scope, the guidwire was placed.  The esophagus was dilated successfully.  The patient was recovered in endoscopy and discharged home in satisfactory condition.   ESOPHAGUS: The mucosa of the esophagus appeared normal.   STOMACH: Moderate erosive gastritis (inflammation) was found in the gastric antrum and gastric body.  Multiple biopsies were performed using cold forceps.   DUODENUM: A diverticulum was found in the 2nd part of the duodenum.   The duodenal mucosa showed no abnormalities in the duodenal bulb.   Dilation was then performed at the mid esophagus  Dilator: Savary over guidewire Size(s): 16 Resistance: minimal Heme: none  COMPLICATIONS: There were no complications.  ENDOSCOPIC IMPRESSION: 1.   EPIGASTRIC PAIN LIKELY DUE TO MODERATE Erosive gastritis 2.   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   DYSPHAGIA MOST LIKELY DUE TO ESOPHAGEAL MOTILITY DISORDER  RECOMMENDATIONS: STOP TAKING IRON. USE LINZESS  145 MCG EVERY MORNING BEFORE BREAKFAST.  MAY USE A 290 MCG LINZESS 2 TIMES A WEEK IF NOT HAVING A SATISFACTORY BOWEL MOVEMENT.  DO NOT TAKE A 290 MCG CAPSULE AND 145 MCG TABLET AT THE SAME TIME.  EAT 4 TO 6 SMALL MEALS A DAY. FOLLOW A SOFT MECHANICAL DIET.  MEATS SHOULD BE CHOPPED OR GROUND ONLY. CONTINUE PROTONIX.  TAKE 30 MINUTES PRIOR TO MEALS TWICE DAILY. BIOPSY WILL BE BACK IN 7 DAYS.  IF YOU CONTINUE TO HAVE RECTAL BLEEDING, CALL ME AND I WILL SEE YOU SOONER TO FIX YOUR HEMORRHOIDS.  FOLLOW UP IN 3 MOS.      _______________________________ Rosalie DoctorJonette Eva, MD 08/14/2013 4:41 PM      PATIENT NAME:  Madison, Henson MR#: [id number]

## 2013-08-16 ENCOUNTER — Encounter (HOSPITAL_COMMUNITY): Payer: Self-pay | Admitting: Gastroenterology

## 2013-08-20 ENCOUNTER — Telehealth: Payer: Self-pay | Admitting: Gastroenterology

## 2013-08-20 NOTE — Telephone Encounter (Signed)
Pt called to speak with nurse about her medicine ( I believe she was talking about Linzess). She is having diarrhea now and thinks she has seen blood streaks in it. Please advise and call her at [phone]

## 2013-08-20 NOTE — Telephone Encounter (Signed)
Tried to call pt. Line was busy. 

## 2013-08-20 NOTE — Telephone Encounter (Addendum)
Called pt back. She is taking the Linzess 145 mcg once a day but did not take today. She had several loose stools yesterday and finally just brown water and then had some red blood in it. She has only had one stool today and it was brown water but no blood. She held off of the Linzess today. She is aware that Dr.Fields is off today.

## 2013-08-21 ENCOUNTER — Ambulatory Visit: Payer: Medicare Other | Admitting: Gastroenterology

## 2013-08-22 ENCOUNTER — Other Ambulatory Visit: Payer: Self-pay | Admitting: Cardiovascular Disease

## 2013-08-22 MED ORDER — HYDROCORTISONE 2.5 % RE CREA: TOPICAL_CREAM | Freq: Four times a day (QID) | RECTAL | Status: DC

## 2013-08-22 MED ORDER — AMITRIPTYLINE HCL 10 MG PO TABS: ORAL_TABLET | ORAL | Status: DC

## 2013-08-22 NOTE — Telephone Encounter (Signed)
Please call pt. Madison Henson stomach Bx shows gastritis.   STOP TAKING IRON. EAT 4 TO 6 SMALL MEALS A DAY. FOLLOW A SOFT MECHANICAL DIET. SEE INFO BELOW.  MEATS SHOULD BE CHOPPED OR GROUND ONLY. CONTINUE PROTONIX. TAKE 30 MINUTES PRIOR TO MEALS TWICE DAILY.  IF YOU CONTINUE TO HAVE RECTAL BLEEDING, CALL DR. Emiya Loomer FIX YOUR HEMORRHOIDS.  FOLLOW UP IN 3 MOS E30 SLF RECTAL BLEEDING, CONSTIPATION.

## 2013-08-22 NOTE — Addendum Note (Signed)
Addended by: Jonette Eva L on: 08/22/2013 05:00 PM   Modules accepted: Orders, Medications

## 2013-08-22 NOTE — Telephone Encounter (Addendum)
TOOK LINZESS AND HAVING STOOL SIZE OF PENCIL. RECTUM IS SORE. HAS APPT WITH VASCULAR SURGEON. ATE RICE KRISPIES WITH MILK. AVOID DAIRY FOR THE NEXT 7 DAYS. USE PROCTOZONE QID FOR 7 DAYS. MAY GO TO SLEEP AND WAKE UP UNDER HER NAVEL SHE HAS SEVERE PAIN. HAD LABS CHECKED AND THEY WERE NORMAL. ADD ELAVIL FOR ABDOMINAL PAIN AND DIARRHEA. MED WARNINGS GIVEN TO INCLUDE SEDATION/DIARRHEA. PT WILL CALL WITH CONCERNS.

## 2013-08-23 NOTE — Telephone Encounter (Signed)
Pt aware of results 

## 2013-08-23 NOTE — Telephone Encounter (Signed)
Tried to call with no answer  

## 2013-08-24 NOTE — Telephone Encounter (Signed)
Reminder in epic °

## 2013-09-21 ENCOUNTER — Other Ambulatory Visit: Payer: Self-pay | Admitting: Cardiovascular Disease

## 2013-09-21 ENCOUNTER — Other Ambulatory Visit: Payer: Self-pay | Admitting: Gastroenterology

## 2013-09-24 ENCOUNTER — Encounter: Payer: Self-pay | Admitting: Vascular Surgery

## 2013-09-25 ENCOUNTER — Encounter (INDEPENDENT_AMBULATORY_CARE_PROVIDER_SITE_OTHER): Payer: Self-pay

## 2013-09-25 ENCOUNTER — Ambulatory Visit (INDEPENDENT_AMBULATORY_CARE_PROVIDER_SITE_OTHER): Payer: Medicare Other | Admitting: Vascular Surgery

## 2013-09-25 ENCOUNTER — Encounter: Payer: Self-pay | Admitting: Vascular Surgery

## 2013-09-25 VITALS — BP 145/77 | HR 71 | Resp 16 | Ht 69.0 in | Wt 187.0 lb

## 2013-09-25 DIAGNOSIS — I714 Abdominal aortic aneurysm, without rupture, unspecified: Secondary | ICD-10-CM | POA: Insufficient documentation

## 2013-09-25 HISTORY — DX: Abdominal aortic aneurysm, without rupture, unspecified: I71.40

## 2013-09-25 HISTORY — DX: Abdominal aortic aneurysm, without rupture: I71.4

## 2013-09-25 NOTE — Progress Notes (Signed)
Patient name: Madison Henson MRN: [id number] DOB: Aug 10, 1928 Sex: female   Referred by: Margo Aye  Reason for referral:  Chief Complaint  Patient presents with  . New Evaluation    Evaluate AAA increasing in size  referred by Dr Dwana Melena     HISTORY OF PRESENT ILLNESS: The patient is an 79 year old female who is seen today to discuss a 3.9 cm infrarenal abdominal aortic aneurysm. She has history of abdominal pain and this is her main complaint currently. There multiple components of this. She reports pain in across her lower abdomen and difficulty with having bowel movements. She also feels that she has a sensation of filling hanging up in her stomach with bleeding and upper, pain with eating as well. He does have a history of prior lung cancer resection and multiple other medical problems as shown in her past medical history. He has no symptoms referable to her aneurysm.  Past Medical History  Diagnosis Date  . Coronary artery disease     post diaphragmatic wall infarction on 06-2007,due to stent thrombosis   . Chronic obstructive pulmonary disease   . Tobacco abuse   . Hypertension   . Hyperlipidemia   . GERD (gastroesophageal reflux disease)   . Anxiety disorder   . Depression   . Hypothyroidism   . Hx of hysterectomy   . AAA (abdominal aortic aneurysm)   . Pulmonary nodule 04/2011    neg bx, followed by Dr. Edwyna Shell, may still be cancer, being followed with CTs.  . Esophageal dysmotility     Two BPE since 03/2011-->Moderate impairment of esophageal motility.Vallecular residuals. Laryngeal penetration  . Anemia   . Diabetes mellitus     with gastroparesis 70% retention at 2 hours  . Cancer   . Lung cancer   . CHF (congestive heart failure)   . Myocardial infarction     Past Surgical History  Procedure Laterality Date  . Cholecystectomy    . Cataract surg    . Bladder surgery      tack  . Kidney stone surgery    . Coronary stent placement  2004    2  . Coronary  stent placement  2008    2  . S/p hysterectomy    . Neck surgery    . Esophagogastroduodenoscopy  5/08    normal esophagus, No H.Pylori  . Egd/bravo  07/2008    On Nexium BID. Day 1 DMSTR Score: 0.7, day 2: DMSTR score: 32, uncontrolled GERD, No H. Pylori  . Hbt  2009    negative  . Colonoscopy  05/2010    tortuous sigm colon, sigm tics, multiple simple adenomas, hemorrhoids, next TCS 10-15 yrs per Dr. Darrick Penna. Previous h/o tubular adenomas in 2008.   Marland Kitchen Esophagogastroduodenoscopy  05/2010    small hh, streaky erythema in body/antrum. Duodenal bx negative.   . Esophagogastroduodenoscopy  06/22/2011    mild gastritis/ring in the distal esophagus, savary dilation. Bx reactive gastropathy, No H.pylori  . Savory dilation  06/22/2011    Procedure: SAVORY DILATION;  Surgeon: Arlyce Harman, MD;  [facility];  Service: Endoscopy;  Laterality: N/A;  [name] dilation  06/22/2011    Procedure: [name] DILATION;  Surgeon: Arlyce Harman, MD;  [facility]: [facility];  Service: Endoscopy;  Laterality: N/A;  . Video brochoscopy with electromagnetic navigaton  05/10/2011    [name]  . Right lower lobe superior segmentectomy  October 2012  . Hysterectomy at age 82    .  Cardiac catheterization    . Abdominal hysterectomy    . Esophagogastroduodenoscopy N/A 08/14/2013    Procedure: ESOPHAGOGASTRODUODENOSCOPY (EGD);  Surgeon: West Bali, MD;  [facility]: [facility];  Service: Endoscopy;  Laterality: N/A;  2:45    History   Social History  . Marital Status: Widowed    Spouse Name: N/A    Number of Children: N/A  . Years of Education: N/A   Occupational History  . Not on file.   Social History Main Topics  . Smoking status: Current Every Day Smoker -- 0.50 packs/day    Types: Cigarettes  . Smokeless tobacco: Never Used     Comment: She smokes 3 cigarretes daily  . Alcohol Use: No  . Drug Use: No  . Sexual Activity: No   Other Topics Concern  . Not on file   Social  History Narrative  . No narrative on file    Family History  Problem Relation Age of Onset  . Coronary artery disease      family hx of  . Colon cancer Sister 83  . Colon cancer Daughter   . Prostate cancer      family hx of  . Lung cancer      family hx of  . Ovarian cancer      family hx of  . Arthritis      family hx of  . Other      family hx of chronic respiratory condition  . Heart disease Mother   . Stroke Mother     Allergies as of 09/25/2013 - Review Complete 09/25/2013  Allergen Reaction Noted  . Hydrocodone Other (See Comments) 08/09/2013  . Linzess [linaclotide]  08/22/2013  . Sulfonamide derivatives Swelling and Rash     Current Outpatient Prescriptions on File Prior to Visit  Medication Sig Dispense Refill  . albuterol (PROAIR HFA) 108 (90 BASE) MCG/ACT inhaler Inhale 2 puffs into the lungs every 6 (six) hours as needed. For wheezing      . ALPRAZolam (XANAX) 1 MG tablet Take 0.5-1 mg by mouth 2 (two) times daily. Take 0.5mg  in the morning & 1mg  at night      . amLODipine (NORVASC) 5 MG tablet TAKE 1 TABLET ONCE A DAY  30 tablet  6  . atorvastatin (LIPITOR) 20 MG tablet Take 20 mg by mouth daily.        . clopidogrel (PLAVIX) 75 MG tablet Take 75 mg by mouth daily.      . clopidogrel (PLAVIX) 75 MG tablet TAKE 1 TABLET DAILY  30 tablet  0  . gabapentin (NEURONTIN) 300 MG capsule Take 300-600 mg by mouth 2 (two) times daily. Take 300mg  in the morning & 600mg  at bedtime      . hydrocortisone (PROCTOZONE-HC) 2.5 % rectal cream Place rectally 4 (four) times daily. FOR 7 DAYS FOR RECTAL PAIN AND BLEEDING  30 g  1  . JANUVIA 50 MG tablet Take 50 mg by mouth daily.       Marland Kitchen levothyroxine (SYNTHROID, LEVOTHROID) 125 MCG tablet Take 125 mcg by mouth daily before breakfast.       . pantoprazole (PROTONIX) 40 MG tablet Take 40 mg by mouth 2 (two) times daily.      . pantoprazole (PROTONIX) 40 MG tablet TAKE  (1)  TABLET TWICE A DAY.  60 tablet  5  . amitriptyline  (ELAVIL) 10 MG tablet 1 po qhs for 3 days 2 po qhs for 3 days 3 po qhs  for 3 days.  90 tablet  11  . Cholecalciferol (VITAMIN D PO) Take 1 tablet by mouth daily.       No current facility-administered medications on file prior to visit.     REVIEW OF SYSTEMS:  Positives indicated with an "X"  CARDIOVASCULAR:  [ ]  chest pain   [ ]  chest pressure   [ ]  palpitations   [ ]  orthopnea   [ ]  dyspnea on exertion   [ ]  claudication   [ ]  rest pain   [ ]  DVT   [ ]  phlebitis PULMONARY:   [ ]  productive cough   [ ]  asthma   [x ] wheezing NEUROLOGIC:   [ ]  weakness  [ ]  paresthesias  [ ]  aphasia  [ ]  amaurosis  [ ]  dizziness HEMATOLOGIC:   [ ]  bleeding problems   [ ]  clotting disorders MUSCULOSKELETAL:  [ ]  joint pain   [ ]  joint swelling GASTROINTESTINAL: [x ]  blood in stool  [ ]   hematemesis GENITOURINARY:  [ ]   dysuria  [ ]   hematuria PSYCHIATRIC:  [ ]  history of major depression INTEGUMENTARY:  [ ]  rashes  [ ]  ulcers CONSTITUTIONAL:  [ ]  fever   [ ]  chills  PHYSICAL EXAMINATION:  General: The patient is a well-nourished female, in no acute distress. Vital signs are BP 145/77  Pulse 71  Resp 16  Ht 5\' 9"  (1.753 m)  Wt 187 lb (84.823 kg)  BMI 27.60 kg/m2 Pulmonary: There is a good air exchange bilaterally without wheezing or rales. Abdomen: Soft and mild-tender with normal pitch bowel sounds. No masses noted and no aneurysm palpable. Musculoskeletal: There are no major deformities.  There is no significant extremity pain. Neurologic: No focal weakness or paresthesias are detected, Skin: There are no ulcer or rashes noted. Psychiatric: The patient has normal affect. Cardiovascular: There is a regular rate and rhythm without significant murmur appreciated. Pulse status, 2+ radial and 2+ femoral pulses bilaterally CT scan from 07/09/2013 was reviewed. This shows a 3.9 cm aneurysm in the infrarenal aorta. This is no change from prior studies back 2013.   Impression and Plan:  Small  infrarenal abdominal aortic aneurysm. Had a very long discussion with the patient and her daughter present. I explained that this is an incidental finding and not causing her any discomfort. I have recommended that we see her again in one year with ultrasound to rule out any progression of size. She is quite concerned regarding her multiple abdominal pain complaints and will dictate to discuss this with Dr. Margo Aye. We will see her again in one year    Oreoluwa Aigner [facility] Office: [phone]

## 2013-09-25 NOTE — Addendum Note (Signed)
Addended by: Sharee Pimple on: 09/25/2013 11:47 AM   Modules accepted: Orders

## 2013-09-27 ENCOUNTER — Telehealth: Payer: Self-pay | Admitting: Gastroenterology

## 2013-09-27 DIAGNOSIS — R05 Cough: Secondary | ICD-10-CM

## 2013-09-27 DIAGNOSIS — C3430 Malignant neoplasm of lower lobe, unspecified bronchus or lung: Secondary | ICD-10-CM

## 2013-09-27 NOTE — Telephone Encounter (Signed)
Patient called to let us know that her cancer doctor at [facility] wanted [facility] to fax them what she was being treated for. She does not know the name of the doctor and I don't know if I should be faxing records without a name of the doctor. Please advise if I need to send [facility] records on Madison Henson.

## 2013-09-27 NOTE — Telephone Encounter (Signed)
Called and informed pt.  

## 2013-09-27 NOTE — Telephone Encounter (Signed)
Please tell the patient to have the cancer center send [place] a release and we will be happy to fax her records to them.

## 2013-10-09 ENCOUNTER — Encounter: Payer: Self-pay | Admitting: Cardiovascular Disease

## 2013-10-09 ENCOUNTER — Ambulatory Visit (INDEPENDENT_AMBULATORY_CARE_PROVIDER_SITE_OTHER): Payer: Medicare Other | Admitting: Cardiovascular Disease

## 2013-10-09 VITALS — BP 122/62 | HR 64 | Ht 69.0 in | Wt 188.0 lb

## 2013-10-09 DIAGNOSIS — I2510 Atherosclerotic heart disease of native coronary artery without angina pectoris: Secondary | ICD-10-CM

## 2013-10-09 DIAGNOSIS — Z720 Tobacco use: Secondary | ICD-10-CM

## 2013-10-09 DIAGNOSIS — I6523 Occlusion and stenosis of bilateral carotid arteries: Secondary | ICD-10-CM

## 2013-10-09 DIAGNOSIS — I6529 Occlusion and stenosis of unspecified carotid artery: Secondary | ICD-10-CM

## 2013-10-09 DIAGNOSIS — I714 Abdominal aortic aneurysm, without rupture: Secondary | ICD-10-CM

## 2013-10-09 DIAGNOSIS — F17200 Nicotine dependence, unspecified, uncomplicated: Secondary | ICD-10-CM

## 2013-10-09 DIAGNOSIS — I658 Occlusion and stenosis of other precerebral arteries: Secondary | ICD-10-CM

## 2013-10-09 NOTE — Patient Instructions (Signed)
Your physician wants you to follow-up in:  6 months. You will receive a reminder letter in the mail two months in advance. If you don't receive a letter, please call our office to schedule the follow-up appointment.   

## 2013-10-09 NOTE — Progress Notes (Signed)
History of Present Illness: 79 yo WF with history of CAD, HTN, Hyperlipidemia, DM, GERD, anxiety/depression here today for cardiac follow up. She has been followed in the past by Dr. Juanda Chance. 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 A new DES was also placed at that time. She previously had a Cypher stent in the circumflex artery as well. Event monitor several years ago with isolated PVCs and APCs. She has chronic shortness of breath related to her COPD. She had an MRI in June 2011 which showed a 3.6 x 3.6 cm infrarenal aortic aneurysm. She has chronic back pain felt to be secondary lumbar disc bulging but not felt to be a surgical candidate. She also has esophageal strictures and had an esophageal dilatation in 2012. She has been on Simvastatin for years. Her lipids are followed in primary care. Last cardiac cath 07/09/11 which showed mild to moderate stable CAD. She had a right lower lobectomy for stage IA non-small cell lung cancer per Dr. Edwyna Shell in October 2012.  She is here today for follow up. She denies any chest pain or change in her breathing. She has been on Plavix chronically with first generation DES. Her ASA was stopped in 2013. She has continued to smoke 3 cigarettes per day. She has had recent evaluation for a UTI in primary care. She has been constipated. She has irregular bowel movements and frequent abdominal pain.   Primary Care Physician: Dr. Margo Aye   Last Lipid Profile: Per primary care.  November 2013 Total chol: 128   LDL: 63  HDL 31    Past Medical History  Diagnosis Date  . Coronary artery disease     post diaphragmatic wall infarction on 06-2007,due to stent thrombosis   . Chronic obstructive pulmonary disease   . Tobacco abuse   . Hypertension   . Hyperlipidemia   . GERD (gastroesophageal reflux disease)   . Anxiety disorder   . Depression   . Hypothyroidism   . Hx of hysterectomy   . AAA (abdominal  aortic aneurysm)   . Pulmonary nodule 04/2011    neg bx, followed by Dr. Edwyna Shell, may still be cancer, being followed with CTs.  . Esophageal dysmotility     Two BPE since 03/2011-->Moderate impairment of esophageal motility.Vallecular residuals. Laryngeal penetration  . Anemia   . Diabetes mellitus     with gastroparesis 70% retention at 2 hours  . Cancer   . Lung cancer   . CHF (congestive heart failure)   . Myocardial infarction     Past Surgical History  Procedure Laterality Date  . Cholecystectomy    . Cataract surg    . Bladder surgery      tack  . Kidney stone surgery    . Coronary stent placement  2004    2  . Coronary stent placement  2008    2  . S/p hysterectomy    . Neck surgery    . Esophagogastroduodenoscopy  5/08    normal esophagus, No H.Pylori  . Egd/bravo  07/2008    On Nexium BID. Day 1 DMSTR Score: 0.7, day 2: DMSTR score: 32, uncontrolled GERD, No H. Pylori  . Hbt  2009    negative  . Colonoscopy  05/2010    tortuous sigm colon, sigm tics, multiple simple adenomas, hemorrhoids, next TCS 10-15 yrs per Dr. Darrick Penna. Previous h/o tubular adenomas in 2008.   Marland Kitchen Esophagogastroduodenoscopy  05/2010  small hh, streaky erythema in body/antrum. Duodenal bx negative.   . Esophagogastroduodenoscopy  06/22/2011    mild gastritis/ring in the distal esophagus, savary dilation. Bx reactive gastropathy, No H.pylori  . Savory dilation  06/22/2011    Procedure: SAVORY DILATION;  Surgeon: Arlyce Harman, MD;  [facility];  Service: Endoscopy;  Laterality: N/A;  [name] dilation  06/22/2011    Procedure: [name] DILATION;  Surgeon: Arlyce Harman, MD;  [facility];  Service: Endoscopy;  Laterality: N/A;  . Video brochoscopy with electromagnetic navigaton  05/10/2011    [name]  . Right lower lobe superior segmentectomy  October 2012  . Hysterectomy at age 73    . Cardiac catheterization    . Abdominal hysterectomy    .  Esophagogastroduodenoscopy N/A 08/14/2013    Procedure: ESOPHAGOGASTRODUODENOSCOPY (EGD);  Surgeon: West Bali, MD;  [facility];  Service: Endoscopy;  Laterality: N/A;  2:45    Current Outpatient Prescriptions  Medication Sig Dispense Refill  . albuterol (PROAIR HFA) 108 (90 BASE) MCG/ACT inhaler Inhale 2 puffs into the lungs every 6 (six) hours as needed. For wheezing      . ALPRAZolam (XANAX) 1 MG tablet Take 0.5-1 mg by mouth 2 (two) times daily. Take 0.5mg  in the morning & 1mg  at night      . amitriptyline (ELAVIL) 10 MG tablet 1 po qhs for 3 days 2 po qhs for 3 days 3 po qhs for 3 days.  90 tablet  11  . amLODipine (NORVASC) 5 MG tablet TAKE 1 TABLET ONCE A DAY  30 tablet  6  . atorvastatin (LIPITOR) 20 MG tablet Take 20 mg by mouth daily.        . Cholecalciferol (VITAMIN D PO) Take 1 tablet by mouth daily.      . clopidogrel (PLAVIX) 75 MG tablet Take 75 mg by mouth daily.      . clopidogrel (PLAVIX) 75 MG tablet TAKE 1 TABLET DAILY  30 tablet  0  . gabapentin (NEURONTIN) 300 MG capsule Take 300-600 mg by mouth 2 (two) times daily. Take 300mg  in the morning & 600mg  at bedtime      . HYDROcodone-acetaminophen (LORTAB) 10-500 MG per tablet Take 1 tablet by mouth every 6 (six) hours as needed for pain.      . hydrocortisone (PROCTOZONE-HC) 2.5 % rectal cream Place rectally 4 (four) times daily. FOR 7 DAYS FOR RECTAL PAIN AND BLEEDING  30 g  1  . JANUVIA 50 MG tablet Take 50 mg by mouth daily.       Marland Kitchen levothyroxine (SYNTHROID, LEVOTHROID) 125 MCG tablet Take 125 mcg by mouth daily before breakfast.       . pantoprazole (PROTONIX) 40 MG tablet Take 40 mg by mouth 2 (two) times daily.      . pantoprazole (PROTONIX) 40 MG tablet TAKE  (1)  TABLET TWICE A DAY.  60 tablet  5   No current facility-administered medications for this visit.    Allergies  Allergen Reactions  . Hydrocodone Other (See Comments)    Causes Hyperactivity   . Linzess [Linaclotide]     EXPLOSIVE  DIARRHEA  . Sulfonamide Derivatives Swelling and Rash    Hands & feet swell    History   Social History  . Marital Status: Widowed    Spouse Name: N/A    Number of Children: N/A  . Years of Education: N/A   Occupational History  . Not on  file.   Social History Main Topics  . Smoking status: Current Every Day Smoker -- 0.50 packs/day    Types: Cigarettes  . Smokeless tobacco: Never Used     Comment: She smokes 3 cigarettes daily  . Alcohol Use: No  . Drug Use: No  . Sexual Activity: No   Other Topics Concern  . Not on file   Social History Narrative  . No narrative on file    Family History  Problem Relation Age of Onset  . Coronary artery disease      family hx of  . Colon cancer Sister 30  . Colon cancer Daughter   . Prostate cancer      family hx of  . Lung cancer      family hx of  . Ovarian cancer      family hx of  . Arthritis      family hx of  . Other      family hx of chronic respiratory condition  . Heart disease Mother   . Stroke Mother     Review of Systems:  As stated in the HPI and otherwise negative.   BP 122/62  Pulse 64  Ht 5\' 9"  (1.753 m)  Wt 188 lb (85.276 kg)  BMI 27.75 kg/m2  Physical Examination: General: Well developed, well nourished, NAD HEENT: OP clear, mucus membranes moist SKIN: warm, dry. No rashes. Neuro: No focal deficits Musculoskeletal: Muscle strength 5/5 all ext Psychiatric: Mood and affect normal Neck: No JVD, no carotid bruits, no thyromegaly, no lymphadenopathy. Lungs:Clear bilaterally, no wheezes, rhonci, crackles Cardiovascular: Regular rate and rhythm. No murmurs, gallops or rubs. Abdomen:Soft. Bowel sounds present. Non-tender.  Extremities: No lower extremity edema. Pulses are 2 + in the bilateral DP/PT.  Lexiscan Myoview 12/05/12: Stress Procedure: The patient received IV Lexiscan 0.4 mg over 15-seconds. Technetium 26m Sestamibi injected at 30-seconds. She did c/o chest tightness 9/10 with Lexiscan and  was give Aminophylline 75 mg with some relief, 5/10. She was given a cup of coffee after Lexiscan and chest tightness was relieved completely prior to stress images. Quantitative spect images were obtained after a 45 minute delay.  Stress ECG: Uninteretable due to baseline LBBB  QPS  Raw Data Images: Acquisition technically good; normal left ventricular size.  Stress Images: There is decreased uptake in the septum and apex.  Rest Images: There is decreased uptake in the septum and apex.  Subtraction (SDS): No evidence of ischemia.  Transient Ischemic Dilatation (Normal <1.22): 0.89  Lung/Heart Ratio (Normal <0.45): 0.35  Quantitative Gated Spect Images  QGS EDV: 106 ml  QGS ESV: 45 ml  Impression  Exercise Capacity: Lexiscan with no exercise.  BP Response: Normal blood pressure response.  Clinical Symptoms: There is chest tightness.  ECG Impression: Baseline: LBBB. EKG uninterpretable due to LBBB at rest and stress.  Comparison with Prior Nuclear Study: No images to compare  Overall Impression: Low risk stress nuclear study with a large, moderate intensity, fixed septal and apical defect consistent with infarct vs LBBB; no significant ischemia.  LV Ejection Fraction: 58%. LV Wall Motion: NL LV Function; NL Wall Motion   Assessment and Plan:   1. CAD: Low risk stress myoview January 2014.  Most recent cath was in August 2012 and her disease was stable. She has continued to smoke. Lipids controlled. BP is well controlled. Continue Plavix and statin.   2. Tobacco abuse: She is cutting back. Smoking cessation encouraged. She wishes to stop.   3. AAA:  Stable. Recent visit with Dr. Arbie Cookey in [facility] November 2014.   4. Carotid artery disease: Stable by dopplers May 2014.

## 2013-10-11 DIAGNOSIS — J449 Chronic obstructive pulmonary disease, unspecified: Secondary | ICD-10-CM | POA: Insufficient documentation

## 2013-10-11 DIAGNOSIS — C3490 Malignant neoplasm of unspecified part of unspecified bronchus or lung: Secondary | ICD-10-CM | POA: Insufficient documentation

## 2013-10-11 DIAGNOSIS — E119 Type 2 diabetes mellitus without complications: Secondary | ICD-10-CM | POA: Insufficient documentation

## 2013-10-23 ENCOUNTER — Other Ambulatory Visit: Payer: Self-pay | Admitting: Cardiovascular Disease

## 2013-10-24 ENCOUNTER — Ambulatory Visit: Payer: Medicare Other | Admitting: Gastroenterology

## 2013-10-24 ENCOUNTER — Ambulatory Visit (INDEPENDENT_AMBULATORY_CARE_PROVIDER_SITE_OTHER): Payer: Medicare Other | Admitting: Gastroenterology

## 2013-10-24 ENCOUNTER — Encounter: Payer: Self-pay | Admitting: Gastroenterology

## 2013-10-24 ENCOUNTER — Other Ambulatory Visit: Payer: Self-pay | Admitting: Gastroenterology

## 2013-10-24 VITALS — BP 117/69 | HR 76 | Temp 97.8°F | Ht 69.0 in | Wt 189.6 lb

## 2013-10-24 DIAGNOSIS — R1013 Epigastric pain: Secondary | ICD-10-CM

## 2013-10-24 DIAGNOSIS — R109 Unspecified abdominal pain: Secondary | ICD-10-CM

## 2013-10-24 NOTE — Progress Notes (Signed)
 Subjective:    Patient ID: Madison Henson, female    DOB: 06/27/1928, 79 y.o.   MRN: [id number]  HALL, ZACH, MD  HPI Goes and wipes and sees black around the stool. Using cream helps. Stomach hurts in the middle/right below belly button and moves up into chest. CRAMPY LOWER ABD PAIN MAY BE FOR HOURS AT A TIME. BETTER WITH AFTER BMs AND PASSING MOST OF THE TIME. ASSOCIATED WITH NL LOOKING IN THE AM AND BLOATING IN THE AFTERNOON.    No blood in vomit: 2x/week. DOESN'T EAT AFTER 4 PM. Nausea: NOT REALLY. BMs: EVERY 2 DAYS. LINZESS WORKS BUT SMALL CALIBER STOOLS. NEVER HAS A BIG SATISFACTORY BM. Can eat and solid food gets stuck. Feel sit going down. Eats small meals 3-4 times a day. CAN'T DRINK A JUG OF PREP. PT DENIES FEVER, CHILLS, nausea, vomiting, diarrhea, constipation, problems with sedation, OR heartburn or indigestion. LOST 11 LBS SINCE OCT 2013.  Past Medical History  Diagnosis Date  . Coronary artery disease     post diaphragmatic wall infarction on 06-2007,due to stent thrombosis   . Chronic obstructive pulmonary disease   . Tobacco abuse   . Hypertension   . Hyperlipidemia   . GERD (gastroesophageal reflux disease)   . Anxiety disorder   . Depression   . Hypothyroidism   . Hx of hysterectomy   . AAA (abdominal aortic aneurysm)   . Pulmonary nodule 04/2011    neg bx, followed by Dr. Burney, may still be cancer, being followed with CTs.  . Esophageal dysmotility     Two BPE since 03/2011-->Moderate impairment of esophageal motility.Vallecular residuals. Laryngeal penetration  . Anemia   . Diabetes mellitus     with gastroparesis 70% retention at 2 hours  . Cancer   . Lung cancer   . CHF (congestive heart failure)   . Myocardial infarction     Past Surgical History  Procedure Laterality Date  . Cholecystectomy    . Cataract surg    . Bladder surgery      tack  . Kidney stone surgery    . Coronary stent placement  2004    2  . Coronary stent placement  2008    2  .  S/p hysterectomy    . Neck surgery    . Esophagogastroduodenoscopy  5/08    normal esophagus, No H.Pylori  . Egd/bravo  07/2008    On Nexium BID. Day 1 DMSTR Score: 0.7, day 2: DMSTR score: 32, uncontrolled GERD, No H. Pylori  . Hbt  2009    negative  . Colonoscopy  05/2010    tortuous sigm colon, sigm tics, multiple simple adenomas, hemorrhoids, next TCS 10-15 yrs per Dr. Carlis Burnsworth. Previous h/o tubular adenomas in 2008.   . Esophagogastroduodenoscopy  05/2010    small hh, streaky erythema in body/antrum. Duodenal bx negative.   . Esophagogastroduodenoscopy  06/22/2011    mild gastritis/ring in the distal esophagus, savary dilation. Bx reactive gastropathy, No H.pylori  . Savory dilation  06/22/2011    Procedure: SAVORY DILATION;  Surgeon: Fredderick Swanger M Nica Friske, MD;  [facility];  Service: Endoscopy;  Laterality: N/A;  . Maloney dilation  06/22/2011    Procedure: MALONEY DILATION;  Surgeon: Coretta Leisey M Vedanth Sirico, MD;  [facility];  Service: Endoscopy;  Laterality: N/A;  . Video brochoscopy with electromagnetic navigaton  05/10/2011    [name]  . Right lower lobe superior segmentectomy  October 2012  . Hysterectomy at age   41    . Cardiac catheterization    . Abdominal hysterectomy    . Esophagogastroduodenoscopy N/A 08/14/2013    Procedure: ESOPHAGOGASTRODUODENOSCOPY (EGD);  Surgeon: Alexia Dinger L Tina Temme, MD;  [facility];  Service: Endoscopy;  Laterality: N/A;  2:45   Allergies  Allergen Reactions  . Hydrocodone Other (See Comments)    Causes Hyperactivity   . Linzess [Linaclotide]     EXPLOSIVE DIARRHEA  . Sulfonamide Derivatives Swelling and Rash    Hands & feet swell    Review of Systems     Objective:   Physical Exam  Vitals reviewed. Constitutional: She is oriented to person, place, and time. She appears well-nourished. No distress.  HENT:  Head: Normocephalic and atraumatic.  Mouth/Throat: No oropharyngeal exudate.  Eyes: Pupils are equal, round, and  reactive to light. No scleral icterus.  Neck: Normal range of motion. Neck supple.  Cardiovascular: Normal rate, regular rhythm and normal heart sounds.   Pulmonary/Chest: Effort normal and breath sounds normal. No respiratory distress.  Abdominal: Soft. Bowel sounds are normal. She exhibits distension (mild). There is no tenderness. There is no rebound and no guarding.  Musculoskeletal: She exhibits no edema.  Lymphadenopathy:    She has no cervical adenopathy.  Neurological: She is alert and oriented to person, place, and time.  NO  NEW FOCAL DEFICITS   Psychiatric: She has a normal mood and affect.          Assessment & Plan:   

## 2013-10-24 NOTE — Assessment & Plan Note (Signed)
ETIOLOGY UNCLEAR-ASSOCIATED WITH CHANGE IN BOWEL HABITS/WEIGHT LOSS, BRBPR, AND ? RECTAL BLEEDING. Sx  MOST LIKELY DUE TO REDUNDANT SIGMOID COLON, LESS LIKELY COLON CA OR POLYP. RECTAL BLEEDING MOST LIKELY DUE TO HEMORRHOIDS. BLACK STOOL-UNCLEAR ETIOLOGY.  TCS/?HEMORRHOID BANDING-MIRALAX BOWLE PREP NEXT WEEK. WILL NEED AGILE STUDY/CAPSULE ENDOSCOPY IF NO SOURCE FOR BLACK STOOL IDENTFIED. HBT IN 2-3 WEEKS TO EVALUATE FOR SIBO OPV IN 4 MOS

## 2013-10-24 NOTE — Patient Instructions (Signed)
COLONOSCOPY WITH POSSIBLE HEMORRHOID BANDING DEC 19.  DEC 16-SOFT MECHANICAL DIET DEC 17-FULL LIQUID DIET AND TAKE MIRALAX 17 GMS PO Q1H FROM 12N TO 6PM. DRINK 1 CUP OF LIQUID 30 MINUTES AFTER EACH DOSE: 1230P TO 630PM. DEC 18-CLEAR LIQUID DIET AND TAKE MIRALAX 17 GMS PO Q1H FROM 12N TO 6PM. DRINK 1 CUP OF LIQUID 30 MINUTES AFTER EACH DOSE: 1230P TO 630PM. TAKE DULCOLAX 10 MG AT 2 PM AND 4 PM. DEC 19-HOLD JANUVIA AND USE A FLEETS ENEMA x1  FOLLOW UP IN 4 MOS.    HEMORRHOIDAL BANDING COMPLICATIONS:  COMMON: 1. MINOR PAIN  UNCOMMON: 1. ABSCESS 2. BAND FALLS OFF 3. PROLAPSE OF HEMORRHOIDS AND PAIN 4. ULCER BLEEDING  A. USUALLY SELF-LIMITED: MAY LAST 3-5 DAYS  B. MAY REQUIRE INTERVENTION: 1-2 WEEKS AFTER INTERACTIONS 5. NECROTIZING PELVIC SEPSIS(< 1%)  A. SYMPTOMS: FEVER, PAIN, DIFFICULTY URINATING  B. SURGICAL EMERGENCY-GO TO ED IF SYMPTOMS DEVELOP

## 2013-10-25 NOTE — Progress Notes (Signed)
Reminder in epic °

## 2013-10-25 NOTE — Progress Notes (Signed)
cc'd to pcp 

## 2013-11-01 ENCOUNTER — Telehealth: Payer: Self-pay | Admitting: Gastroenterology

## 2013-11-01 NOTE — Telephone Encounter (Signed)
PLEASE CALL PT. SHE SHOULD MAKE SURE SHE IS TAKING HER BP MEDS. CONTINUE TO PREP FOR TCS. IF HER BP IS ELEVATED IN ENDO WE CAN GIVE HER MEDS TO BRING IT DOWN.

## 2013-11-01 NOTE — Telephone Encounter (Signed)
Please call patient at [phone]. She has questions regarding her Miralax and her BP going up.

## 2013-11-01 NOTE — Telephone Encounter (Signed)
Pt is aware of what SLF said 

## 2013-11-01 NOTE — Telephone Encounter (Signed)
Pt is worried about her BP. This morning it was 146/109. She is doing the miralax prep for her TCS in the morning.Please advise

## 2013-11-02 ENCOUNTER — Encounter (HOSPITAL_COMMUNITY): Payer: Self-pay | Admitting: *Deleted

## 2013-11-02 ENCOUNTER — Ambulatory Visit: Admit: 2013-11-02 | Payer: Self-pay | Admitting: Gastroenterology

## 2013-11-02 ENCOUNTER — Encounter (HOSPITAL_COMMUNITY)
Admission: RE | Disposition: A | Discharge status: Home / Self Care | Payer: Self-pay | Source: Ambulatory Visit | Attending: Gastroenterology

## 2013-11-02 ENCOUNTER — Ambulatory Visit (HOSPITAL_COMMUNITY)
Admission: RE | Admit: 2013-11-02 | Discharge: 2013-11-02 | Disposition: A | Discharge status: Home / Self Care | Payer: Medicare Other | Source: Ambulatory Visit | Attending: Gastroenterology | Admitting: Gastroenterology

## 2013-11-02 DIAGNOSIS — K5730 Diverticulosis of large intestine without perforation or abscess without bleeding: Secondary | ICD-10-CM

## 2013-11-02 DIAGNOSIS — K644 Residual hemorrhoidal skin tags: Secondary | ICD-10-CM | POA: Insufficient documentation

## 2013-11-02 DIAGNOSIS — E119 Type 2 diabetes mellitus without complications: Secondary | ICD-10-CM | POA: Insufficient documentation

## 2013-11-02 DIAGNOSIS — R109 Unspecified abdominal pain: Secondary | ICD-10-CM

## 2013-11-02 DIAGNOSIS — I10 Essential (primary) hypertension: Secondary | ICD-10-CM | POA: Insufficient documentation

## 2013-11-02 DIAGNOSIS — E785 Hyperlipidemia, unspecified: Secondary | ICD-10-CM | POA: Insufficient documentation

## 2013-11-02 DIAGNOSIS — R195 Other fecal abnormalities: Secondary | ICD-10-CM

## 2013-11-02 DIAGNOSIS — D126 Benign neoplasm of colon, unspecified: Secondary | ICD-10-CM | POA: Insufficient documentation

## 2013-11-02 DIAGNOSIS — R1013 Epigastric pain: Secondary | ICD-10-CM

## 2013-11-02 DIAGNOSIS — Z01812 Encounter for preprocedural laboratory examination: Secondary | ICD-10-CM | POA: Insufficient documentation

## 2013-11-02 HISTORY — PX: HEMORRHOID BANDING: SHX5850

## 2013-11-02 HISTORY — PX: COLONOSCOPY: SHX5424

## 2013-11-02 LAB — GLUCOSE, CAPILLARY: Glucose-Capillary: 92 mg/dL (ref 70–99)

## 2013-11-02 SURGERY — COLONOSCOPY
Anesthesia: Moderate Sedation

## 2013-11-02 MED ORDER — SODIUM CHLORIDE 0.9 % IV SOLN
INTRAVENOUS | Status: DC
  Administered 2013-11-02: 1000 mL via INTRAVENOUS

## 2013-11-02 MED ORDER — STERILE WATER FOR IRRIGATION IR SOLN
Status: DC | PRN
  Administered 2013-11-02: 13:00:00

## 2013-11-02 MED ORDER — MEPERIDINE HCL 100 MG/ML IJ SOLN
INTRAMUSCULAR | Status: DC | PRN
  Administered 2013-11-02 (×3): 25 mg via INTRAVENOUS

## 2013-11-02 MED ORDER — MIDAZOLAM HCL 5 MG/5ML IJ SOLN
INTRAMUSCULAR | Status: AC
  Filled 2013-11-02: qty 10

## 2013-11-02 MED ORDER — MEPERIDINE HCL 100 MG/ML IJ SOLN
INTRAMUSCULAR | Status: AC
  Filled 2013-11-02: qty 2

## 2013-11-02 MED ORDER — MIDAZOLAM HCL 5 MG/5ML IJ SOLN
INTRAMUSCULAR | Status: DC | PRN
  Administered 2013-11-02: 1 mg via INTRAVENOUS
  Administered 2013-11-02 (×3): 2 mg via INTRAVENOUS

## 2013-11-02 NOTE — Op Note (Signed)
[facility] [address] [place], [address]   COLONOSCOPY PROCEDURE REPORT  PATIENT: Madison Henson, Madison Henson  MR#: [id number] BIRTHDATE: 1928-10-31 , 85  yrs. old GENDER: Female ENDOSCOPIST: Jonette Eva, MD REFERRED XB:JYNW Hall, M.D. PROCEDURE DATE:  11/02/2013 PROCEDURE:   Colonoscopy with snare polypectomy and with cold biopsy polypectomy INDICATIONS:black stools, abdominal pain.     MEDICATIONS: Demerol 75 mg IV and Versed 7 mg IV DESCRIPTION OF PROCEDURE:    Physical exam was performed.  Informed consent was obtained from the patient after explaining the benefits, risks, and alternatives to procedure.  The patient was connected to monitor and placed in left lateral position. Continuous oxygen was provided by nasal cannula and IV medicine administered through an indwelling cannula.  After administration of sedation and rectal exam, the patients rectum was intubated and the EG-[id number] ([id number]), EC-3890Li ([id number]), and EC-[id number] ([id number])  colonoscope was advanced under direct visualization to the ileum.  The scope was removed slowly by carefully examining the color, texture, anatomy, and integrity mucosa on the way out.  The patient was recovered in endoscopy and discharged home in satisfactory condition.    COLON FINDINGS: The mucosa appeared normal in the terminal ileum.  , Ten sessile polyps measuring 3-8 mm in size were found at the hepatic flexure, in the transverse colon, and sigmoid colon.  A polypectomy was performed with cold forceps and using snare cautery.  , There was moderate diverticulosis noted in the descending colon and sigmoid colon with associated angulation, tortuosity and muscular hypertrophy.  , and Moderate sized external hemorrhoids were found.  PREP QUALITY: good. CECAL W/D TIME: 29 minutes     COMPLICATIONS: None  ENDOSCOPIC IMPRESSION: 1.   NO SOURCE FOR ABDOMINAL PAIN IDENTIFIED 2.   Ten COLON polyps REMOVED 3.   Moderate  diverticulosis in the descending colon and sigmoid colon 4.   Moderate sized external hemorrhoids  RECOMMENDATIONS: DRINK WATER TO KEEP YOUR URINE LIGHT YELLOW. FOLLOW A HIGH FIBER DIET.  AVOID ITEMS THAT CAUSE BLOATING. AGILE STUDY FOLLOWED BY CAPSULE ENDOSCOPY TO COMPLETE WORKUP FOR reported melena. COMPLETE HYDROGEN BREATH. FOLLOW UP IN APR 2015. BIOPSY RESULTS SHOULD BE BACK IN 7 DAYS. Next colonoscopy in 3-5 years IF BENEFITS OUTWEIGH RISKS.  ALL SISTERS, BROTHERS, AND CHILDREN NEED TO HAVE A COLONOSCOPY STARTING AT THE AGE OF 40.       _______________________________ Rosalie DoctorJonette Eva, MD 11/02/2013 2:11 PM     PATIENT NAME:  Madison Henson, Madison Henson MR#: [id number]

## 2013-11-02 NOTE — H&P (View-Only) (Signed)
Subjective:    Patient ID: Madison Henson, female    DOB: Sep 22, 1928, 79 y.o.   MRN: [id number]  [name], MD  HPI Goes and wipes and sees black around the stool. Using cream helps. Stomach hurts in the middle/right below belly button and moves up into chest. CRAMPY LOWER ABD PAIN MAY BE FOR HOURS AT A TIME. BETTER WITH AFTER BMs AND PASSING MOST OF THE TIME. ASSOCIATED WITH NL LOOKING IN THE AM AND BLOATING IN THE AFTERNOON.    No blood in vomit: 2x/week. DOESN'T EAT AFTER 4 PM. Nausea: NOT REALLY. BMs: EVERY 2 DAYS. LINZESS WORKS BUT SMALL CALIBER STOOLS. NEVER HAS A BIG SATISFACTORY BM. Can eat and solid food gets stuck. Feel sit going down. Eats small meals 3-4 times a day. CAN'T DRINK A JUG OF PREP. PT DENIES FEVER, CHILLS, nausea, vomiting, diarrhea, constipation, problems with sedation, OR heartburn or indigestion. LOST 11 LBS SINCE OCT 2013.  Past Medical History  Diagnosis Date  . Coronary artery disease     post diaphragmatic wall infarction on 06-2007,due to stent thrombosis   . Chronic obstructive pulmonary disease   . Tobacco abuse   . Hypertension   . Hyperlipidemia   . GERD (gastroesophageal reflux disease)   . Anxiety disorder   . Depression   . Hypothyroidism   . Hx of hysterectomy   . AAA (abdominal aortic aneurysm)   . Pulmonary nodule 04/2011    neg bx, followed by Dr. Edwyna Shell, may still be cancer, being followed with CTs.  . Esophageal dysmotility     Two BPE since 03/2011-->Moderate impairment of esophageal motility.Vallecular residuals. Laryngeal penetration  . Anemia   . Diabetes mellitus     with gastroparesis 70% retention at 2 hours  . Cancer   . Lung cancer   . CHF (congestive heart failure)   . Myocardial infarction     Past Surgical History  Procedure Laterality Date  . Cholecystectomy    . Cataract surg    . Bladder surgery      tack  . Kidney stone surgery    . Coronary stent placement  2004    2  . Coronary stent placement  2008    2  .  S/p hysterectomy    . Neck surgery    . Esophagogastroduodenoscopy  5/08    normal esophagus, No H.Pylori  . Egd/bravo  07/2008    On Nexium BID. Day 1 DMSTR Score: 0.7, day 2: DMSTR score: 32, uncontrolled GERD, No H. Pylori  . Hbt  2009    negative  . Colonoscopy  05/2010    tortuous sigm colon, sigm tics, multiple simple adenomas, hemorrhoids, next TCS 10-15 yrs per Dr. Darrick Penna. Previous h/o tubular adenomas in 2008.   Marland Kitchen Esophagogastroduodenoscopy  05/2010    small hh, streaky erythema in body/antrum. Duodenal bx negative.   . Esophagogastroduodenoscopy  06/22/2011    mild gastritis/ring in the distal esophagus, savary dilation. Bx reactive gastropathy, No H.pylori  . Savory dilation  06/22/2011    Procedure: SAVORY DILATION;  Surgeon: Arlyce Harman, MD;  Location: [facility];  Service: Endoscopy;  Laterality: N/A;  [name] dilation  06/22/2011    Procedure: [name] DILATION;  Surgeon: Arlyce Harman, MD;  Location: [facility];  Service: Endoscopy;  Laterality: N/A;  . Video brochoscopy with electromagnetic navigaton  05/10/2011    [name]  . Right lower lobe superior segmentectomy  October 2012  . Hysterectomy at age  41    . Cardiac catheterization    . Abdominal hysterectomy    . Esophagogastroduodenoscopy N/A 08/14/2013    Procedure: ESOPHAGOGASTRODUODENOSCOPY (EGD);  Surgeon: West Bali, MD;  Location: [facility];  Service: Endoscopy;  Laterality: N/A;  2:45   Allergies  Allergen Reactions  . Hydrocodone Other (See Comments)    Causes Hyperactivity   . Linzess [Linaclotide]     EXPLOSIVE DIARRHEA  . Sulfonamide Derivatives Swelling and Rash    Hands & feet swell    Review of Systems     Objective:   Physical Exam  Vitals reviewed. Constitutional: She is oriented to person, place, and time. She appears well-nourished. No distress.  HENT:  Head: Normocephalic and atraumatic.  Mouth/Throat: No oropharyngeal exudate.  Eyes: Pupils are equal, round, and  reactive to light. No scleral icterus.  Neck: Normal range of motion. Neck supple.  Cardiovascular: Normal rate, regular rhythm and normal heart sounds.   Pulmonary/Chest: Effort normal and breath sounds normal. No respiratory distress.  Abdominal: Soft. Bowel sounds are normal. She exhibits distension (mild). There is no tenderness. There is no rebound and no guarding.  Musculoskeletal: She exhibits no edema.  Lymphadenopathy:    She has no cervical adenopathy.  Neurological: She is alert and oriented to person, place, and time.  NO  NEW FOCAL DEFICITS   Psychiatric: She has a normal mood and affect.          Assessment & Plan:

## 2013-11-02 NOTE — Interval H&P Note (Signed)
History and Physical Interval Note:  11/02/2013 12:59 PM  Madison Henson  has presented today for surgery, with the diagnosis of ABDOMINAL PAIN AND DARK AROUND HER STOOL  The various methods of treatment have been discussed with the patient and family. After consideration of risks, benefits and other options for treatment, the patient has consented to  Procedure(s) with comments: COLONOSCOPY (N/A) - 1:15 HEMORRHOID BANDING (N/A) as a surgical intervention .  The patient's history has been reviewed, patient examined, no change in status, stable for surgery.  I have reviewed the patient's chart and labs.  Questions were answered to the patient's satisfaction.     [employer]

## 2013-11-05 ENCOUNTER — Other Ambulatory Visit: Payer: Self-pay | Admitting: Gastroenterology

## 2013-11-05 ENCOUNTER — Encounter (HOSPITAL_COMMUNITY): Payer: Self-pay | Admitting: Pharmacy Technician

## 2013-11-05 ENCOUNTER — Telehealth: Payer: Self-pay | Admitting: Gastroenterology

## 2013-11-05 DIAGNOSIS — R1013 Epigastric pain: Secondary | ICD-10-CM

## 2013-11-05 DIAGNOSIS — K921 Melena: Secondary | ICD-10-CM

## 2013-11-05 NOTE — Telephone Encounter (Signed)
HBT Is scheduled for Friday 11/16/13 at 8:00 am and I have mailed her the instructions

## 2013-11-05 NOTE — Telephone Encounter (Signed)
Message copied by Glendora Score on Mon Nov 05, 2013  9:38 AM ------      Message from: West Bali      Created: Fri Nov 02, 2013  2:11 PM       AGILE STUDY NEXT WEEK      HBT FOR SIBO WITHIN THE NEXT 7-10 DAYS      CAPSULE ENDOSCOPY AFTER JAN 2                  DX: ABD PAIN/MELENA ------

## 2013-11-05 NOTE — Telephone Encounter (Signed)
Madison Henson is scheduled for a Agile Capsule Study tomorrow Dec 23rd at 8:00 am and her daughter is aware

## 2013-11-06 ENCOUNTER — Ambulatory Visit (HOSPITAL_COMMUNITY)
Admission: RE | Admit: 2013-11-06 | Discharge: 2013-11-06 | Disposition: A | Discharge status: Home / Self Care | Payer: Medicare Other | Source: Ambulatory Visit | Attending: Gastroenterology | Admitting: Gastroenterology

## 2013-11-06 ENCOUNTER — Encounter (HOSPITAL_COMMUNITY): Payer: Self-pay | Admitting: Gastroenterology

## 2013-11-06 ENCOUNTER — Telehealth: Payer: Self-pay | Admitting: Gastroenterology

## 2013-11-06 ENCOUNTER — Encounter (HOSPITAL_COMMUNITY)
Admission: RE | Disposition: A | Discharge status: Home / Self Care | Payer: Self-pay | Source: Ambulatory Visit | Attending: Gastroenterology

## 2013-11-06 ENCOUNTER — Encounter (HOSPITAL_COMMUNITY): Payer: Self-pay | Admitting: Pharmacy Technician

## 2013-11-06 ENCOUNTER — Other Ambulatory Visit: Payer: Self-pay | Admitting: Gastroenterology

## 2013-11-06 DIAGNOSIS — R1013 Epigastric pain: Secondary | ICD-10-CM

## 2013-11-06 SURGERY — AGILE CAPSULE

## 2013-11-06 NOTE — Telephone Encounter (Signed)
HBT scheduled for Jan 2nd at 8:00 patient is aware

## 2013-11-06 NOTE — Telephone Encounter (Signed)
Agile Study scheduled for 12/23 at 8:00 am

## 2013-11-06 NOTE — Telephone Encounter (Signed)
Please call pt. She had EIGHT simple adenomas removed from her colon.   DRINK WATER TO KEEP YOUR URINE LIGHT YELLOW.  FOLLOW A HIGH FIBER DIET. AVOID ITEMS THAT CAUSE BLOATING.  AGILE STUDY FOLLOWED BY CAPSULE ENDOSCOPY TO COMPLETE WORKUP FOR reported melena.  COMPLETE HYDROGEN BREATH.  FOLLOW UP IN APR 2015.  Next colonoscopy in 3-5 years IF BENEFITS OUTWEIGH RISKS. ALL SISTERS, BROTHERS, AND CHILDREN NEED TO HAVE A COLONOSCOPY STARTING AT THE AGE OF 40.

## 2013-11-07 NOTE — Telephone Encounter (Signed)
Reminder in epic °

## 2013-11-12 ENCOUNTER — Ambulatory Visit (HOSPITAL_COMMUNITY)
Admission: RE | Admit: 2013-11-12 | Discharge: 2013-11-12 | Disposition: A | Discharge status: Home / Self Care | Payer: Medicare Other | Source: Ambulatory Visit | Attending: Gastroenterology | Admitting: Gastroenterology

## 2013-11-12 ENCOUNTER — Encounter (HOSPITAL_COMMUNITY)
Admission: RE | Disposition: A | Discharge status: Home / Self Care | Payer: Self-pay | Source: Ambulatory Visit | Attending: Gastroenterology

## 2013-11-12 DIAGNOSIS — R109 Unspecified abdominal pain: Secondary | ICD-10-CM | POA: Insufficient documentation

## 2013-11-12 HISTORY — PX: AGILE CAPSULE: SHX5420

## 2013-11-12 SURGERY — AGILE CAPSULE

## 2013-11-12 NOTE — Telephone Encounter (Signed)
Called and informed pt's daughter, Nettie Elm. She asked about instructions for prep before Hydrogen Breath Test on 11/16/2013, and I read the info from letter in chart.

## 2013-11-13 ENCOUNTER — Ambulatory Visit (HOSPITAL_COMMUNITY)
Admission: RE | Admit: 2013-11-13 | Discharge: 2013-11-13 | Disposition: A | Discharge status: Home / Self Care | Payer: Medicare Other | Source: Ambulatory Visit | Attending: Gastroenterology | Admitting: Gastroenterology

## 2013-11-14 ENCOUNTER — Encounter (HOSPITAL_COMMUNITY): Payer: Self-pay | Admitting: Gastroenterology

## 2013-11-14 ENCOUNTER — Other Ambulatory Visit: Payer: Self-pay | Admitting: Gastroenterology

## 2013-11-14 NOTE — Telephone Encounter (Signed)
Pt's daughter, Nettie Elm called and I gave her the info and time, etc. She said Soledad Gerlach can call her on her cell and let her know about the KUB.

## 2013-11-14 NOTE — Telephone Encounter (Signed)
PLEASE CALL PT'S DAUGHTER. HER CAPSULE IS IN HER SMALL BOWEL ON THE FILM FROM YESTERDAY. SHE NEEDS A REPEAT AGILE STUDY ON MON AND REPEAT UPRIGHT KUB 28 hours after she swallows THE AGILE PILL.

## 2013-11-14 NOTE — Telephone Encounter (Signed)
Patient is scheduled for Agile on Monday Jan 5th at 7:30 am needing to arrive at 7:00 am NPO after midnight clear liquids after 12:00 pm on Jan 4th day before. No Iron

## 2013-11-14 NOTE — Procedures (Signed)
AGILE STUDY IN SMALL BOWEL AFTER 25 HOURS.  PLAN:  1. REPEAT AGILE STUDY JAN 5 WITH KUB 28 HRS AFTER INGESTION

## 2013-11-14 NOTE — Telephone Encounter (Signed)
Spoke to [place] regarding KUB and she is aware

## 2013-11-14 NOTE — Telephone Encounter (Signed)
I called and told pt. She wants [place] to tell her daughter, Nettie Elm ( cell number [phone] ) when she gets out of the doctor's office around lunch time. I told her Soledad Gerlach will be working on scheduling and we will talk to Nettie Elm this afternoon and let her know all about it.

## 2013-11-16 ENCOUNTER — Ambulatory Visit (HOSPITAL_COMMUNITY)
Admission: RE | Admit: 2013-11-16 | Discharge: 2013-11-16 | Disposition: A | Discharge status: Home / Self Care | Payer: Medicare Other | Source: Ambulatory Visit | Attending: Gastroenterology | Admitting: Gastroenterology

## 2013-11-16 ENCOUNTER — Encounter (HOSPITAL_COMMUNITY): Payer: Self-pay | Admitting: *Deleted

## 2013-11-16 ENCOUNTER — Encounter (HOSPITAL_COMMUNITY): Payer: Self-pay | Admitting: Pharmacy Technician

## 2013-11-16 ENCOUNTER — Encounter (HOSPITAL_COMMUNITY)
Admission: RE | Disposition: A | Discharge status: Home / Self Care | Payer: Self-pay | Source: Ambulatory Visit | Attending: Gastroenterology

## 2013-11-16 DIAGNOSIS — R141 Gas pain: Secondary | ICD-10-CM

## 2013-11-16 DIAGNOSIS — G8929 Other chronic pain: Secondary | ICD-10-CM

## 2013-11-16 DIAGNOSIS — R143 Flatulence: Principal | ICD-10-CM

## 2013-11-16 DIAGNOSIS — R142 Eructation: Principal | ICD-10-CM | POA: Insufficient documentation

## 2013-11-16 DIAGNOSIS — R1013 Epigastric pain: Secondary | ICD-10-CM

## 2013-11-16 HISTORY — PX: BACTERIAL OVERGROWTH TEST: SHX5739

## 2013-11-16 SURGERY — BREATH TEST, FOR INTESTINAL BACTERIAL OVERGROWTH

## 2013-11-16 MED ORDER — LACTULOSE 10 GM/15ML PO SOLN
25.0000 g | Freq: Once | ORAL | Status: AC
  Administered 2013-11-16: 25 g via ORAL

## 2013-11-16 MED ORDER — LACTULOSE 10 GM/15ML PO SOLN
ORAL | Status: AC
  Filled 2013-11-16: qty 30

## 2013-11-16 NOTE — Progress Notes (Addendum)
No beans, bran or high fiber cereal the day before the procedure? no NPO except for water 12 hours before procedure? {YES  No smoking, sleeping or vigorous exercising for at least 30 before procedure? {no Recent antibiotic use and/or diarrhea? no   If yes, physician notified.  Time Baseline 15 mins 30 mins 45 mins 60 mins 75 mins 90 mins 105 mins 120 mins 135 mins 150 mins 165 mins 180 mins  H2-ppm 11  15 14 11 11 9 8 7 7 11  7

## 2013-11-19 ENCOUNTER — Telehealth: Payer: Self-pay | Admitting: Gastroenterology

## 2013-11-19 ENCOUNTER — Encounter (HOSPITAL_COMMUNITY): Payer: Self-pay | Admitting: Gastroenterology

## 2013-11-19 ENCOUNTER — Telehealth: Payer: Self-pay | Admitting: *Deleted

## 2013-11-19 NOTE — Telephone Encounter (Signed)
REVIEWED.  

## 2013-11-19 NOTE — Telephone Encounter (Signed)
Patient called and cancelled her Agile Study this morning she said she would call back to R/S later she has a cold

## 2013-11-19 NOTE — Telephone Encounter (Signed)
Patient has been R/S to Thursday Jan 15 at 7:30 and daughter Sunday Spillers is aware

## 2013-11-19 NOTE — Telephone Encounter (Signed)
Pt states she canceled the appointment for this morning for the swallowing study, pt states she is very sick. Pt would like to reschedule. Pt states to please call her daughter Sunday Spillers [phone] to make appointment. Pt also stated she needed to know a head of time because she rides the [employer] BUS.

## 2013-11-21 ENCOUNTER — Telehealth: Payer: Self-pay | Admitting: Gastroenterology

## 2013-11-21 NOTE — Telephone Encounter (Signed)
I R/S her to 8:00 am and she is aware

## 2013-11-21 NOTE — Telephone Encounter (Signed)
Pt called and said that SF wanted her to go to the hospital to swallow the capsule and it's scheduled for 0730. She said that she needs it scheduled later in the day because of [employer] and her daughter living in [place]. Please call patient at [phone]

## 2013-11-23 ENCOUNTER — Other Ambulatory Visit: Payer: Self-pay | Admitting: Cardiovascular Disease

## 2013-11-26 NOTE — Procedures (Addendum)
PRE-OPERATIVE DIAGNOSIS:  BLOATING, LOWER ABD PAIN  POST-OPERATIVE DIAGNOSIS:  BLOATING/ADB PAIN MOST LIKELY DUE TO FUNCTIONAL GUT DISORDER  PROCEDURE:  Procedure(s): HYDROGEN BREATH TEST  SURGEON:  Surgeon(s): Dorothyann Peng, MD  MEDICATIONS USED:  LACTULOSE  FINDINGS: BREATHALYZER- O MIN(11 PPM), FIRST PEAK (15 PPM, 30 MINS), TROUGH (7 PPM 120 MINS), SECOND PEAK (11 PPM, 150 MINS)  DIAGNOSIS: NORMAL HYDRDOGEN BREATH TEST  PLAN OF CARE:  1. CONTINUE LINZESS 2. COMPLETE AGILE STUDY 3. OPV APR 2015

## 2013-11-26 NOTE — Telephone Encounter (Signed)
PLEASE CALL PT. HER BREATH TEST IS NORMAL.  PLAN OF CARE:  1. CONTINUE LINZESS. 2. COMPLETE AGILE STUDY. 3. OPV APR 2015.

## 2013-11-27 NOTE — Telephone Encounter (Signed)
Tried to call with no answer  

## 2013-11-29 ENCOUNTER — Encounter (HOSPITAL_COMMUNITY): Admission: RE | Payer: Self-pay | Source: Ambulatory Visit

## 2013-11-29 ENCOUNTER — Other Ambulatory Visit: Payer: Self-pay | Admitting: Gastroenterology

## 2013-11-29 ENCOUNTER — Telehealth: Payer: Self-pay | Admitting: Gastroenterology

## 2013-11-29 ENCOUNTER — Ambulatory Visit (HOSPITAL_COMMUNITY): Admission: RE | Admit: 2013-11-29 | Payer: Medicare Other | Source: Ambulatory Visit | Admitting: Gastroenterology

## 2013-11-29 SURGERY — AGILE CAPSULE

## 2013-11-29 NOTE — Telephone Encounter (Signed)
REVIEWED.  

## 2013-11-29 NOTE — Telephone Encounter (Signed)
Ms. Mckethan called Endo this morning and cancelled her Agile Capsule Study again 2nd time

## 2013-12-03 NOTE — Telephone Encounter (Signed)
Mailed patient letter.

## 2013-12-05 ENCOUNTER — Encounter (HOSPITAL_COMMUNITY): Payer: Self-pay | Admitting: Emergency Medicine

## 2013-12-05 ENCOUNTER — Inpatient Hospital Stay (HOSPITAL_COMMUNITY)
Admission: EM | Admit: 2013-12-05 | Discharge: 2013-12-07 | DRG: 191 | Disposition: A | Discharge status: Home Health | Payer: Medicare Other | Attending: Family Medicine | Admitting: Family Medicine

## 2013-12-05 ENCOUNTER — Emergency Department (HOSPITAL_COMMUNITY): Payer: Medicare Other

## 2013-12-05 DIAGNOSIS — M11869 Other specified crystal arthropathies, unspecified knee: Secondary | ICD-10-CM

## 2013-12-05 DIAGNOSIS — I714 Abdominal aortic aneurysm, without rupture, unspecified: Secondary | ICD-10-CM

## 2013-12-05 DIAGNOSIS — D649 Anemia, unspecified: Secondary | ICD-10-CM

## 2013-12-05 DIAGNOSIS — R079 Chest pain, unspecified: Secondary | ICD-10-CM

## 2013-12-05 DIAGNOSIS — I252 Old myocardial infarction: Secondary | ICD-10-CM

## 2013-12-05 DIAGNOSIS — E876 Hypokalemia: Secondary | ICD-10-CM

## 2013-12-05 DIAGNOSIS — J449 Chronic obstructive pulmonary disease, unspecified: Secondary | ICD-10-CM | POA: Diagnosis present

## 2013-12-05 DIAGNOSIS — Z9861 Coronary angioplasty status: Secondary | ICD-10-CM

## 2013-12-05 DIAGNOSIS — F3289 Other specified depressive episodes: Secondary | ICD-10-CM | POA: Diagnosis present

## 2013-12-05 DIAGNOSIS — Z8041 Family history of malignant neoplasm of ovary: Secondary | ICD-10-CM

## 2013-12-05 DIAGNOSIS — K648 Other hemorrhoids: Secondary | ICD-10-CM

## 2013-12-05 DIAGNOSIS — Z801 Family history of malignant neoplasm of trachea, bronchus and lung: Secondary | ICD-10-CM

## 2013-12-05 DIAGNOSIS — K5900 Constipation, unspecified: Secondary | ICD-10-CM

## 2013-12-05 DIAGNOSIS — R911 Solitary pulmonary nodule: Secondary | ICD-10-CM

## 2013-12-05 DIAGNOSIS — Z823 Family history of stroke: Secondary | ICD-10-CM

## 2013-12-05 DIAGNOSIS — K224 Dyskinesia of esophagus: Secondary | ICD-10-CM | POA: Diagnosis present

## 2013-12-05 DIAGNOSIS — E039 Hypothyroidism, unspecified: Secondary | ICD-10-CM | POA: Diagnosis present

## 2013-12-05 DIAGNOSIS — Z720 Tobacco use: Secondary | ICD-10-CM

## 2013-12-05 DIAGNOSIS — Z8719 Personal history of other diseases of the digestive system: Secondary | ICD-10-CM

## 2013-12-05 DIAGNOSIS — Z90710 Acquired absence of both cervix and uterus: Secondary | ICD-10-CM

## 2013-12-05 DIAGNOSIS — F329 Major depressive disorder, single episode, unspecified: Secondary | ICD-10-CM | POA: Diagnosis present

## 2013-12-05 DIAGNOSIS — K921 Melena: Secondary | ICD-10-CM

## 2013-12-05 DIAGNOSIS — E785 Hyperlipidemia, unspecified: Secondary | ICD-10-CM

## 2013-12-05 DIAGNOSIS — Z8249 Family history of ischemic heart disease and other diseases of the circulatory system: Secondary | ICD-10-CM

## 2013-12-05 DIAGNOSIS — Z85118 Personal history of other malignant neoplasm of bronchus and lung: Secondary | ICD-10-CM

## 2013-12-05 DIAGNOSIS — K219 Gastro-esophageal reflux disease without esophagitis: Secondary | ICD-10-CM

## 2013-12-05 DIAGNOSIS — Z800 Family history of malignant neoplasm of digestive organs: Secondary | ICD-10-CM

## 2013-12-05 DIAGNOSIS — R1310 Dysphagia, unspecified: Secondary | ICD-10-CM

## 2013-12-05 DIAGNOSIS — R1319 Other dysphagia: Secondary | ICD-10-CM

## 2013-12-05 DIAGNOSIS — Z87442 Personal history of urinary calculi: Secondary | ICD-10-CM

## 2013-12-05 DIAGNOSIS — I2510 Atherosclerotic heart disease of native coronary artery without angina pectoris: Secondary | ICD-10-CM

## 2013-12-05 DIAGNOSIS — F17200 Nicotine dependence, unspecified, uncomplicated: Secondary | ICD-10-CM | POA: Diagnosis present

## 2013-12-05 DIAGNOSIS — R1013 Epigastric pain: Secondary | ICD-10-CM

## 2013-12-05 DIAGNOSIS — N390 Urinary tract infection, site not specified: Secondary | ICD-10-CM

## 2013-12-05 DIAGNOSIS — Z79899 Other long term (current) drug therapy: Secondary | ICD-10-CM

## 2013-12-05 DIAGNOSIS — R109 Unspecified abdominal pain: Secondary | ICD-10-CM

## 2013-12-05 DIAGNOSIS — I10 Essential (primary) hypertension: Secondary | ICD-10-CM

## 2013-12-05 DIAGNOSIS — M5430 Sciatica, unspecified side: Secondary | ICD-10-CM

## 2013-12-05 DIAGNOSIS — K3184 Gastroparesis: Secondary | ICD-10-CM

## 2013-12-05 DIAGNOSIS — R197 Diarrhea, unspecified: Secondary | ICD-10-CM

## 2013-12-05 DIAGNOSIS — I509 Heart failure, unspecified: Secondary | ICD-10-CM | POA: Diagnosis present

## 2013-12-05 DIAGNOSIS — Z7902 Long term (current) use of antithrombotics/antiplatelets: Secondary | ICD-10-CM

## 2013-12-05 DIAGNOSIS — M48061 Spinal stenosis, lumbar region without neurogenic claudication: Secondary | ICD-10-CM

## 2013-12-05 DIAGNOSIS — F411 Generalized anxiety disorder: Secondary | ICD-10-CM | POA: Diagnosis present

## 2013-12-05 DIAGNOSIS — E1149 Type 2 diabetes mellitus with other diabetic neurological complication: Secondary | ICD-10-CM | POA: Diagnosis present

## 2013-12-05 DIAGNOSIS — Z9981 Dependence on supplemental oxygen: Secondary | ICD-10-CM

## 2013-12-05 DIAGNOSIS — J4489 Other specified chronic obstructive pulmonary disease: Secondary | ICD-10-CM

## 2013-12-05 DIAGNOSIS — J441 Chronic obstructive pulmonary disease with (acute) exacerbation: Principal | ICD-10-CM

## 2013-12-05 LAB — CBC WITH DIFFERENTIAL/PLATELET
BASOS ABS: 0 10*3/uL (ref 0.0–0.1)
Basophils Relative: 0 % (ref 0–1)
EOS ABS: 0 10*3/uL (ref 0.0–0.7)
EOS PCT: 0 % (ref 0–5)
HCT: 40.4 % (ref 36.0–46.0)
Hemoglobin: 13.3 g/dL (ref 12.0–15.0)
Lymphocytes Relative: 29 % (ref 12–46)
Lymphs Abs: 3.3 10*3/uL (ref 0.7–4.0)
MCH: 30.8 pg (ref 26.0–34.0)
MCHC: 32.9 g/dL (ref 30.0–36.0)
MCV: 93.5 fL (ref 78.0–100.0)
MONO ABS: 0.7 10*3/uL (ref 0.1–1.0)
Monocytes Relative: 7 % (ref 3–12)
NEUTROS ABS: 7.1 10*3/uL (ref 1.7–7.7)
Neutrophils Relative %: 64 % (ref 43–77)
Platelets: 211 10*3/uL (ref 150–400)
RBC: 4.32 MIL/uL (ref 3.87–5.11)
RDW: 12.9 % (ref 11.5–15.5)
WBC: 11.2 10*3/uL — ABNORMAL HIGH (ref 4.0–10.5)

## 2013-12-05 LAB — BASIC METABOLIC PANEL
BUN: 14 mg/dL (ref 6–23)
CALCIUM: 9 mg/dL (ref 8.4–10.5)
CO2: 28 mEq/L (ref 19–32)
CREATININE: 0.92 mg/dL (ref 0.50–1.10)
Chloride: 100 mEq/L (ref 96–112)
GFR calc Af Amer: 65 mL/min — ABNORMAL LOW (ref >90)
GFR, EST NON AFRICAN AMERICAN: 56 mL/min — AB (ref >90)
Glucose, Bld: 126 mg/dL — ABNORMAL HIGH (ref 70–99)
Potassium: 3 mEq/L — ABNORMAL LOW (ref 3.7–5.3)
Sodium: 141 mEq/L (ref 137–147)

## 2013-12-05 LAB — URINALYSIS, ROUTINE W REFLEX MICROSCOPIC
BILIRUBIN URINE: NEGATIVE
GLUCOSE, UA: NEGATIVE mg/dL
Hgb urine dipstick: NEGATIVE
Ketones, ur: NEGATIVE mg/dL
Nitrite: NEGATIVE
PROTEIN: 30 mg/dL — AB
Specific Gravity, Urine: >1.03 — ABNORMAL HIGH (ref 1.005–1.030)
UROBILINOGEN UA: 0.2 mg/dL (ref 0.0–1.0)
pH: 5.5 (ref 5.0–8.0)

## 2013-12-05 LAB — URINE MICROSCOPIC-ADD ON

## 2013-12-05 LAB — GLUCOSE, CAPILLARY
Glucose-Capillary: 223 mg/dL — ABNORMAL HIGH (ref 70–99)
Glucose-Capillary: 339 mg/dL — ABNORMAL HIGH (ref 70–99)

## 2013-12-05 LAB — TROPONIN I: Troponin I: <0.3 ng/mL (ref <0.30)

## 2013-12-05 LAB — MAGNESIUM: MAGNESIUM: 2 mg/dL (ref 1.5–2.5)

## 2013-12-05 LAB — PRO B NATRIURETIC PEPTIDE: Pro B Natriuretic peptide (BNP): 250.1 pg/mL (ref 0–450)

## 2013-12-05 LAB — LACTIC ACID, PLASMA: Lactic Acid, Venous: 1.7 mmol/L (ref 0.5–2.2)

## 2013-12-05 LAB — INFLUENZA PANEL BY PCR (TYPE A & B)
H1N1FLUPCR: NOT DETECTED
INFLAPCR: NEGATIVE
Influenza B By PCR: NEGATIVE

## 2013-12-05 MED ORDER — INSULIN ASPART 100 UNIT/ML ~~LOC~~ SOLN
0.0000 [IU] | Freq: Three times a day (TID) | SUBCUTANEOUS | Status: DC
  Administered 2013-12-05: 3 [IU] via SUBCUTANEOUS
  Administered 2013-12-06 (×2): 2 [IU] via SUBCUTANEOUS

## 2013-12-05 MED ORDER — TRAZODONE HCL 50 MG PO TABS
25.0000 mg | ORAL_TABLET | Freq: Every evening | ORAL | Status: DC | PRN
  Administered 2013-12-06: 25 mg via ORAL
  Filled 2013-12-05: qty 1

## 2013-12-05 MED ORDER — ALPRAZOLAM 0.5 MG PO TABS
0.5000 mg | ORAL_TABLET | Freq: Every day | ORAL | Status: DC
  Administered 2013-12-06 – 2013-12-07 (×2): 0.5 mg via ORAL
  Filled 2013-12-05 (×2): qty 1

## 2013-12-05 MED ORDER — POTASSIUM CHLORIDE CRYS ER 20 MEQ PO TBCR
40.0000 meq | EXTENDED_RELEASE_TABLET | ORAL | Status: AC
  Administered 2013-12-05 (×2): 40 meq via ORAL
  Filled 2013-12-05 (×2): qty 2

## 2013-12-05 MED ORDER — ENOXAPARIN SODIUM 40 MG/0.4ML ~~LOC~~ SOLN
40.0000 mg | SUBCUTANEOUS | Status: DC
  Administered 2013-12-05 – 2013-12-06 (×2): 40 mg via SUBCUTANEOUS
  Filled 2013-12-05 (×2): qty 0.4

## 2013-12-05 MED ORDER — PANTOPRAZOLE SODIUM 40 MG PO TBEC
40.0000 mg | DELAYED_RELEASE_TABLET | Freq: Two times a day (BID) | ORAL | Status: DC
  Administered 2013-12-05 – 2013-12-07 (×4): 40 mg via ORAL
  Filled 2013-12-05 (×4): qty 1

## 2013-12-05 MED ORDER — ACETAMINOPHEN 650 MG RE SUPP: 650.0000 mg | Freq: Four times a day (QID) | RECTAL | Status: DC | PRN

## 2013-12-05 MED ORDER — METHYLPREDNISOLONE SODIUM SUCC 125 MG IJ SOLR: 125.0000 mg | Freq: Once | INTRAMUSCULAR | Status: DC

## 2013-12-05 MED ORDER — DULOXETINE HCL 60 MG PO CPEP
60.0000 mg | ORAL_CAPSULE | Freq: Every day | ORAL | Status: DC
  Administered 2013-12-05 – 2013-12-07 (×3): 60 mg via ORAL
  Filled 2013-12-05 (×3): qty 1

## 2013-12-05 MED ORDER — DEXTROSE 5 % IV SOLN
500.0000 mg | Freq: Once | INTRAVENOUS | Status: AC
  Administered 2013-12-05: 500 mg via INTRAVENOUS

## 2013-12-05 MED ORDER — CLOPIDOGREL BISULFATE 75 MG PO TABS
75.0000 mg | ORAL_TABLET | Freq: Every day | ORAL | Status: DC
  Administered 2013-12-06 – 2013-12-07 (×2): 75 mg via ORAL
  Filled 2013-12-05 (×2): qty 1

## 2013-12-05 MED ORDER — LEVOFLOXACIN 750 MG PO TABS
750.0000 mg | ORAL_TABLET | Freq: Every day | ORAL | Status: DC
  Administered 2013-12-05 – 2013-12-07 (×3): 750 mg via ORAL
  Filled 2013-12-05 (×3): qty 1

## 2013-12-05 MED ORDER — LINAGLIPTIN 5 MG PO TABS: 5.0000 mg | ORAL_TABLET | Freq: Every day | ORAL | Status: DC

## 2013-12-05 MED ORDER — SODIUM CHLORIDE 0.9 % IJ SOLN
3.0000 mL | Freq: Two times a day (BID) | INTRAMUSCULAR | Status: DC
  Administered 2013-12-05: 3 mL via INTRAVENOUS

## 2013-12-05 MED ORDER — MAGNESIUM HYDROXIDE 400 MG/5ML PO SUSP
30.0000 mL | Freq: Every day | ORAL | Status: DC | PRN
  Administered 2013-12-07: 30 mL via ORAL
  Filled 2013-12-05: qty 30

## 2013-12-05 MED ORDER — ALUM & MAG HYDROXIDE-SIMETH 200-200-20 MG/5ML PO SUSP: 30.0000 mL | Freq: Four times a day (QID) | ORAL | Status: DC | PRN

## 2013-12-05 MED ORDER — LEVOTHYROXINE SODIUM 112 MCG PO TABS
112.0000 ug | ORAL_TABLET | Freq: Every day | ORAL | Status: DC
  Administered 2013-12-06 – 2013-12-07 (×2): 112 ug via ORAL
  Filled 2013-12-05 (×5): qty 1

## 2013-12-05 MED ORDER — ALBUTEROL SULFATE (2.5 MG/3ML) 0.083% IN NEBU
5.0000 mg | INHALATION_SOLUTION | Freq: Once | RESPIRATORY_TRACT | Status: AC
  Administered 2013-12-05: 5 mg via RESPIRATORY_TRACT
  Filled 2013-12-05: qty 6

## 2013-12-05 MED ORDER — METOCLOPRAMIDE HCL 10 MG PO TABS
5.0000 mg | ORAL_TABLET | Freq: Four times a day (QID) | ORAL | Status: DC
  Administered 2013-12-05 – 2013-12-07 (×6): 5 mg via ORAL
  Filled 2013-12-05 (×7): qty 1

## 2013-12-05 MED ORDER — SODIUM CHLORIDE 0.9 % IJ SOLN: 3.0000 mL | INTRAMUSCULAR | Status: DC | PRN

## 2013-12-05 MED ORDER — HYDROCODONE-ACETAMINOPHEN 5-325 MG PO TABS: 1.0000 | ORAL_TABLET | ORAL | Status: DC | PRN

## 2013-12-05 MED ORDER — ONDANSETRON HCL 4 MG/2ML IJ SOLN: 4.0000 mg | Freq: Four times a day (QID) | INTRAMUSCULAR | Status: DC | PRN

## 2013-12-05 MED ORDER — GABAPENTIN 300 MG PO CAPS
600.0000 mg | ORAL_CAPSULE | Freq: Every day | ORAL | Status: DC
  Administered 2013-12-05 – 2013-12-06 (×2): 600 mg via ORAL
  Filled 2013-12-05 (×2): qty 2

## 2013-12-05 MED ORDER — ACETAMINOPHEN 325 MG PO TABS: 650.0000 mg | ORAL_TABLET | Freq: Four times a day (QID) | ORAL | Status: DC | PRN

## 2013-12-05 MED ORDER — ALPRAZOLAM 1 MG PO TABS
1.0000 mg | ORAL_TABLET | Freq: Every day | ORAL | Status: DC
  Administered 2013-12-05 – 2013-12-06 (×2): 1 mg via ORAL
  Filled 2013-12-05 (×2): qty 1

## 2013-12-05 MED ORDER — BISACODYL 10 MG RE SUPP: 10.0000 mg | Freq: Every day | RECTAL | Status: DC | PRN

## 2013-12-05 MED ORDER — GABAPENTIN 300 MG PO CAPS
300.0000 mg | ORAL_CAPSULE | Freq: Every day | ORAL | Status: DC
  Administered 2013-12-06 – 2013-12-07 (×2): 300 mg via ORAL
  Filled 2013-12-05 (×2): qty 1

## 2013-12-05 MED ORDER — IPRATROPIUM-ALBUTEROL 0.5-2.5 (3) MG/3ML IN SOLN
3.0000 mL | RESPIRATORY_TRACT | Status: DC
  Administered 2013-12-05 – 2013-12-07 (×11): 3 mL via RESPIRATORY_TRACT
  Filled 2013-12-05 (×12): qty 3

## 2013-12-05 MED ORDER — GABAPENTIN 300 MG PO CAPS: 300.0000 mg | ORAL_CAPSULE | Freq: Two times a day (BID) | ORAL | Status: DC

## 2013-12-05 MED ORDER — GUAIFENESIN-DM 100-10 MG/5ML PO SYRP: 5.0000 mL | ORAL_SOLUTION | ORAL | Status: DC | PRN

## 2013-12-05 MED ORDER — METHYLPREDNISOLONE SODIUM SUCC 125 MG IJ SOLR
125.0000 mg | INTRAMUSCULAR | Status: AC
  Administered 2013-12-05: 125 mg via INTRAVENOUS
  Filled 2013-12-05: qty 2

## 2013-12-05 MED ORDER — METHYLPREDNISOLONE SODIUM SUCC 125 MG IJ SOLR
60.0000 mg | Freq: Two times a day (BID) | INTRAMUSCULAR | Status: AC
  Administered 2013-12-05 – 2013-12-06 (×3): 60 mg via INTRAVENOUS
  Filled 2013-12-05 (×3): qty 2

## 2013-12-05 MED ORDER — ATORVASTATIN CALCIUM 20 MG PO TABS
20.0000 mg | ORAL_TABLET | Freq: Every day | ORAL | Status: DC
  Administered 2013-12-05 – 2013-12-07 (×3): 20 mg via ORAL
  Filled 2013-12-05 (×3): qty 1

## 2013-12-05 MED ORDER — SODIUM CHLORIDE 0.9 % IV SOLN: 250.0000 mL | INTRAVENOUS | Status: DC | PRN

## 2013-12-05 MED ORDER — ONDANSETRON HCL 4 MG PO TABS: 4.0000 mg | ORAL_TABLET | Freq: Four times a day (QID) | ORAL | Status: DC | PRN

## 2013-12-05 MED ORDER — AMLODIPINE BESYLATE 5 MG PO TABS
5.0000 mg | ORAL_TABLET | Freq: Every day | ORAL | Status: DC
  Administered 2013-12-06 – 2013-12-07 (×2): 5 mg via ORAL
  Filled 2013-12-05 (×2): qty 1

## 2013-12-05 MED ORDER — ALPRAZOLAM 0.5 MG PO TABS: 0.5000 mg | ORAL_TABLET | Freq: Two times a day (BID) | ORAL | Status: DC

## 2013-12-05 MED ORDER — SODIUM CHLORIDE 0.9 % IJ SOLN
3.0000 mL | Freq: Two times a day (BID) | INTRAMUSCULAR | Status: DC
  Administered 2013-12-05 – 2013-12-07 (×4): 3 mL via INTRAVENOUS

## 2013-12-05 NOTE — ED Provider Notes (Signed)
CSN: [id number]     Arrival date & time 12/05/13  1235 History  This chart was scribed for Carmin Muskrat, MD by Rolanda Lundborg, ED Scribe. This patient was seen in room APA04/APA04 and the patient's care was started at 12:35 PM.    Chief Complaint  Patient presents with  . Chest Pain   The history is provided by the patient. No language interpreter was used.   HPI Comments: Madison Henson is a 80 y.o. female with a h/o CAD, COPD, HTN, AAA, MI, and CHF who presents to the Emergency Department complaining of SOB onset yesterday afternoon. She is on oxygen at home at night. She reports diaphoresis last night. She reports weakness and nausea, worse after eating. She has been compliant with all her medications. She reports pain under the left breast bone. She reports she has slowed down her smoking.    Past Medical History  Diagnosis Date  . Coronary artery disease     post diaphragmatic wall infarction on 06-2007,due to stent thrombosis   . Chronic obstructive pulmonary disease   . Tobacco abuse   . Hypertension   . Hyperlipidemia   . GERD (gastroesophageal reflux disease)   . Anxiety disorder   . Depression   . Hypothyroidism   . Hx of hysterectomy   . AAA (abdominal aortic aneurysm)   . Pulmonary nodule 04/2011    neg bx, followed by Dr. Arlyce Dice, may still be cancer, being followed with CTs.  . Esophageal dysmotility     Two BPE since 03/2011-->Moderate impairment of esophageal motility.Vallecular residuals. Laryngeal penetration  . Anemia   . Diabetes mellitus     with gastroparesis 70% retention at 2 hours  . Cancer   . Lung cancer   . CHF (congestive heart failure)   . Myocardial infarction   . AAA (abdominal aortic aneurysm) without rupture 09/25/2013   Past Surgical History  Procedure Laterality Date  . Cholecystectomy    . Cataract surg    . Bladder surgery      tack  . Kidney stone surgery    . Coronary stent placement  2004    2  . Coronary stent placement  2008     2  . S/p hysterectomy    . Neck surgery    . Esophagogastroduodenoscopy  5/08    normal esophagus, No H.Pylori  . Egd/bravo  07/2008    On Nexium BID. Day 1 DMSTR Score: 0.7, day 2: DMSTR score: 32, uncontrolled GERD, No H. Pylori  . Hbt  2009    negative  . Colonoscopy  05/2010    tortuous sigm colon, sigm tics, multiple simple adenomas, hemorrhoids, next TCS 10-15 yrs per Dr. Oneida Alar. Previous h/o tubular adenomas in 2008.   Marland Kitchen Esophagogastroduodenoscopy  05/2010    small hh, streaky erythema in body/antrum. Duodenal bx negative.   . Esophagogastroduodenoscopy  06/22/2011    mild gastritis/ring in the distal esophagus, savary dilation. Bx reactive gastropathy, No H.pylori  . Savory dilation  06/22/2011    Procedure: SAVORY DILATION;  Surgeon: Dorothyann Peng, MD;  [facility];  Service: Endoscopy;  Laterality: N/A;  [name] dilation  06/22/2011    Procedure: [name] DILATION;  Surgeon: Dorothyann Peng, MD;  [facility];  Service: Endoscopy;  Laterality: N/A;  . Video brochoscopy with electromagnetic navigaton  05/10/2011    [name]  . Right lower lobe superior segmentectomy  October 2012  . Hysterectomy at age 26    .  Cardiac catheterization    . Abdominal hysterectomy    . Esophagogastroduodenoscopy N/A 08/14/2013    Procedure: ESOPHAGOGASTRODUODENOSCOPY (EGD);  Surgeon: Danie Binder, MD;  [facility];  Service: Endoscopy;  Laterality: N/A;  2:45  . Colonoscopy N/A 11/02/2013    Procedure: COLONOSCOPY;  Surgeon: Danie Binder, MD;  [facility];  Service: Endoscopy;  Laterality: N/A;  1:15  . Hemorrhoid banding N/A 11/02/2013    Procedure: HEMORRHOID BANDING;  Surgeon: Danie Binder, MD;  [facility];  Service: Endoscopy;  Laterality: N/A;  . Agile capsule N/A 11/12/2013    Procedure: AGILE CAPSULE;  Surgeon: Danie Binder, MD;  [facility];  Service: Endoscopy;  Laterality: N/A;  8:00  . Bacterial  overgrowth test N/A 11/16/2013    Procedure: BACTERIAL OVERGROWTH TEST;  Surgeon: Danie Binder, MD;  [facility];  Service: Endoscopy;  Laterality: N/A;  8:00   Family History  Problem Relation Age of Onset  . Coronary artery disease      family hx of  . Colon cancer Sister 10  . Colon cancer Daughter   . Prostate cancer      family hx of  . Lung cancer      family hx of  . Ovarian cancer      family hx of  . Arthritis      family hx of  . Other      family hx of chronic respiratory condition  . Heart disease Mother   . Stroke Mother    History  Substance Use Topics  . Smoking status: Current Every Day Smoker -- 0.50 packs/day    Types: Cigarettes  . Smokeless tobacco: Never Used     Comment: She smokes 3 cigarettes daily  . Alcohol Use: No   OB History   Grav Para Term Preterm Abortions TAB SAB Ect Mult Living                 Review of Systems  Constitutional:       Per HPI, otherwise negative  HENT:       Per HPI, otherwise negative  Respiratory:       Per HPI, otherwise negative  Cardiovascular:       Per HPI, otherwise negative  Gastrointestinal: Negative for vomiting.  Endocrine:       Negative aside from HPI  Genitourinary:       Neg aside from HPI   Musculoskeletal:       Per HPI, otherwise negative  Skin: Negative.   Neurological: Negative for syncope.    Allergies  Linzess; Oxycodone; and Sulfonamide derivatives  Home Medications   Current Outpatient Rx  Name  Route  Sig  Dispense  Refill  . albuterol (PROAIR HFA) 108 (90 BASE) MCG/ACT inhaler   Inhalation   Inhale 2 puffs into the lungs every 6 (six) hours as needed. For wheezing         . ALPRAZolam (XANAX) 1 MG tablet   Oral   Take 0.5-1 mg by mouth 2 (two) times daily. Take 0.5mg  in the morning & 1mg  at night         . amLODipine (NORVASC) 5 MG tablet   Oral   Take 5 mg by mouth daily.         Marland Kitchen atorvastatin (LIPITOR) 20 MG tablet   Oral   Take 20 mg by mouth  daily.           Marland Kitchen  clopidogrel (PLAVIX) 75 MG tablet   Oral   Take 75 mg by mouth daily.         . clopidogrel (PLAVIX) 75 MG tablet      TAKE 1 TABLET DAILY   30 tablet   1     Have pt call office tomake an apt [phone]   . DULoxetine (CYMBALTA) 60 MG capsule   Oral   Take 1 capsule by mouth daily.         Marland Kitchen gabapentin (NEURONTIN) 300 MG capsule   Oral   Take 300-600 mg by mouth 2 (two) times daily. Take 300mg  in the morning & 600mg  at bedtime         . HYDROcodone-acetaminophen (NORCO/VICODIN) 5-325 MG per tablet   Oral   Take 1 tablet by mouth every 6 (six) hours as needed. pain         . JANUVIA 50 MG tablet   Oral   Take 50 mg by mouth daily.          Marland Kitchen levothyroxine (SYNTHROID, LEVOTHROID) 125 MCG tablet   Oral   Take 125 mcg by mouth daily before breakfast.          . pantoprazole (PROTONIX) 40 MG tablet   Oral   Take 40 mg by mouth 2 (two) times daily.          . ranitidine (ZANTAC) 150 MG tablet   Oral   Take 1 tablet by mouth at bedtime.          BP 136/52  Pulse 82  Temp(Src) 98.9 F (37.2 C) (Oral)  Resp 26  Ht 5\' 9"  (1.753 m)  Wt 180 lb (81.647 kg)  BMI 26.57 kg/m2  SpO2 97% Physical Exam  Nursing note and vitals reviewed. Constitutional: She is oriented to person, place, and time. She appears well-developed and well-nourished. No distress.  HENT:  Head: Normocephalic and atraumatic.  Eyes: Conjunctivae and EOM are normal.  Cardiovascular: Normal rate and regular rhythm.   Pulmonary/Chest: Effort normal and breath sounds normal. No stridor. No respiratory distress.  Coarse breath sounds throughout  Abdominal: She exhibits no distension.  Musculoskeletal: She exhibits no edema.  Neurological: She is alert and oriented to person, place, and time. No cranial nerve deficit.  Skin: Skin is warm and dry.  Psychiatric: She has a normal mood and affect.    ED Course  Procedures (including critical care time) Medications   albuterol (PROVENTIL) (2.5 MG/3ML) 0.083% nebulizer solution 5 mg (5 mg Nebulization Given 12/05/13 1351)  azithromycin (ZITHROMAX) 500 mg in dextrose 5 % 250 mL IVPB (500 mg Intravenous New Bag/Given 12/05/13 1314)  albuterol (PROVENTIL) (2.5 MG/3ML) 0.083% nebulizer solution 5 mg (5 mg Nebulization Given 12/05/13 1449)  methylPREDNISolone sodium succinate (SOLU-MEDROL) 125 mg/2 mL injection 125 mg (125 mg Intravenous Given 12/05/13 1456)    DIAGNOSTIC STUDIES: Oxygen Saturation is 97% on RA, normal by my interpretation.    COORDINATION OF CARE: 12:45 PM- Discussed treatment plan with pt which includes EKG, CXR, blood work, troponin, and breathing treatments. Pt agrees to plan.  3:12 PM Patient continued to have coarse breath sounds, tachypnea.  She has mild tachycardia.  Labs Review Labs Reviewed  CBC WITH DIFFERENTIAL - Abnormal; Notable for the following:    WBC 11.2 (*)    All other components within normal limits  BASIC METABOLIC PANEL - Abnormal; Notable for the following:    Potassium 3.0 (*)    Glucose, Bld 126 (*)  GFR calc non Af Amer 56 (*)    GFR calc Af Amer 65 (*)    All other components within normal limits  TROPONIN I  PRO B NATRIURETIC PEPTIDE  LACTIC ACID, PLASMA  URINALYSIS, ROUTINE W REFLEX MICROSCOPIC   Imaging Review Dg Chest Portable 1 View  12/05/2013   CLINICAL DATA:  Chest pain  EXAM: PORTABLE CHEST - 1 VIEW  COMPARISON:  11/28/2013  FINDINGS: Postop lobectomy on the right. No recurrent mass. Lungs are clear without infiltrate or effusion.  IMPRESSION: No active disease.   Electronically Signed   By: Franchot Gallo M.D.   On: 12/05/2013 13:10    EKG Interpretation    Date/Time:  Wednesday December 05 2013 13:17:14 EST Ventricular Rate:  97 PR Interval:  200 QRS Duration: 154 QT Interval:  424 QTC Calculation: 538 R Axis:   72 Text Interpretation:  Sinus rhythm with frequent and consecutive Premature ventricular complexes Left bundle branch block  Abnormal ECG Left bundle branch block Sinus rhythm Premature ventricular complexes No significant change since last tracing Abnormal ekg Confirmed by Carmin Muskrat  MD [phone]) on 12/05/2013 1:50:26 PM            MDM   1. COPD exacerbation    Patient presents with dyspnea, generalized complaints. On exam patient is awake and alert, hemodynamically stable, though mildly tachypneic.  Since her lung sounds remained poor after multiple albuterol treatments, initiation of steroids, antibiotics.  With her comorbidities, including COPD, she was admitted for further evaluation and management of this likely exacerbation. The patient did complain of chest pain, initial evaluation is reassuring, troponin is negative, and her EKG is unchanged.  Carmin Muskrat, MD 12/05/13 [phone]

## 2013-12-05 NOTE — ED Notes (Signed)
Patient arrives via EMS with c/o mid chest pain x 3 days intermittently. Worse with deep breathing. Wheezing noted with EMS, albuterol neb and one duo neb given PTA. 125 mg Solu-medrol IV given PTA. Patient alert/oriented x 4. Duo neb in progress at time of triage.

## 2013-12-05 NOTE — ED Notes (Signed)
Report given to Caryl Pina, RN unit 300. Ready to receive patient.

## 2013-12-05 NOTE — H&P (Signed)
[facility] History and Physical  Madison Henson HBZ:[id number] DOB: January 24, 1928 DOA: 12/05/2013  Referring physician: Vanita Panda PCP: Delphina Cahill, MD   Chief Complaint: Worsening shortness of breath and chest pain  HPI: Madison Henson is a very pleasant 80 y.o. female with a past medical history that includes CAD, COPD, CHF, hypertension, MI presents to the emergency room with the chief complaint of worsening cough and chest pain. Patient reports that 7 days ago she developed a productive cough so she went to the urgent care Center. She indicated that she was given some medication she is unsure of what medication that she took that she got no better. He states the cough continued to worsen she became more short of breath. 3 days ago she developed intermittent left anterior chest pain as well. Other associated symptoms include chills, nausea, vomiting, generalized weakness and palpitations. She reports sharp intermittent pain located under her left breast radiating to her left side particularly when she coughs. She denies abdominal pain diarrhea constipation melena or bright red blood per rectum. She denies dysuria hematuria frequency or urgency She states that she has oxygen at home but that she doesn't usually wear at. She has been wearing it for the last couple days without much relief. She also owns a nebulizer machine but she has not given herself nebs because they make her nauseated. Initial workup in the emergency department yields a white count of 11.2 potassium of 3.0 serum glucose of 126, proBNP 250, lactic acid 1.7 otherwise her lab work is unremarkable. Chest x-ray yields clear lungs without mass or infiltrate. EKG yields normal sinus rhythm with PVCs left bundle branch block no significant changes. Her vital signs are stable and she is not hypoxic. In the emergency room she was provided with albuterol nebulizer treatments, Zithromax, and Solu-Medrol. We are asked to admit   Review of  Systems:  10 point review of systems her complete all systems are negative except as indicated in her history of present illness Past Medical History  Diagnosis Date  . Coronary artery disease     post diaphragmatic wall infarction on 06-2007,due to stent thrombosis   . Chronic obstructive pulmonary disease   . Tobacco abuse   . Hypertension   . Hyperlipidemia   . GERD (gastroesophageal reflux disease)   . Anxiety disorder   . Depression   . Hypothyroidism   . Hx of hysterectomy   . AAA (abdominal aortic aneurysm)   . Pulmonary nodule 04/2011    neg bx, followed by Dr. Arlyce Dice, may still be cancer, being followed with CTs.  . Esophageal dysmotility     Two BPE since 03/2011-->Moderate impairment of esophageal motility.Vallecular residuals. Laryngeal penetration  . Anemia   . Diabetes mellitus     with gastroparesis 70% retention at 2 hours  . Cancer   . Lung cancer   . CHF (congestive heart failure)   . Myocardial infarction   . AAA (abdominal aortic aneurysm) without rupture 09/25/2013   Past Surgical History  Procedure Laterality Date  . Cholecystectomy    . Cataract surg    . Bladder surgery      tack  . Kidney stone surgery    . Coronary stent placement  2004    2  . Coronary stent placement  2008    2  . S/p hysterectomy    . Neck surgery    . Esophagogastroduodenoscopy  5/08    normal esophagus, No H.Pylori  . Claris Pong  07/2008  On Nexium BID. Day 1 DMSTR Score: 0.7, day 2: DMSTR score: 32, uncontrolled GERD, No H. Pylori  . Hbt  2009    negative  . Colonoscopy  05/2010    tortuous sigm colon, sigm tics, multiple simple adenomas, hemorrhoids, next TCS 10-15 yrs per Dr. Oneida Alar. Previous h/o tubular adenomas in 2008.   Marland Kitchen Esophagogastroduodenoscopy  05/2010    small hh, streaky erythema in body/antrum. Duodenal bx negative.   . Esophagogastroduodenoscopy  06/22/2011    mild gastritis/ring in the distal esophagus, savary dilation. Bx reactive gastropathy, No H.pylori   . Savory dilation  06/22/2011    Procedure: SAVORY DILATION;  Surgeon: Dorothyann Peng, MD;  [facility];  Service: Endoscopy;  Laterality: N/A;  [name] dilation  06/22/2011    Procedure: [name] DILATION;  Surgeon: Dorothyann Peng, MD;  [facility];  Service: Endoscopy;  Laterality: N/A;  . Video brochoscopy with electromagnetic navigaton  05/10/2011    [name]  . Right lower lobe superior segmentectomy  October 2012  . Hysterectomy at age 39    . Cardiac catheterization    . Abdominal hysterectomy    . Esophagogastroduodenoscopy N/A 08/14/2013    Procedure: ESOPHAGOGASTRODUODENOSCOPY (EGD);  Surgeon: Danie Binder, MD;  [facility];  Service: Endoscopy;  Laterality: N/A;  2:45  . Colonoscopy N/A 11/02/2013    Procedure: COLONOSCOPY;  Surgeon: Danie Binder, MD;  [facility];  Service: Endoscopy;  Laterality: N/A;  1:15  . Hemorrhoid banding N/A 11/02/2013    Procedure: HEMORRHOID BANDING;  Surgeon: Danie Binder, MD;  [facility];  Service: Endoscopy;  Laterality: N/A;  . Agile capsule N/A 11/12/2013    Procedure: AGILE CAPSULE;  Surgeon: Danie Binder, MD;  [facility];  Service: Endoscopy;  Laterality: N/A;  8:00  . Bacterial overgrowth test N/A 11/16/2013    Procedure: BACTERIAL OVERGROWTH TEST;  Surgeon: Danie Binder, MD;  [facility];  Service: Endoscopy;  Laterality: N/A;  8:00   Social History:  reports that she has been smoking Cigarettes.  She has been smoking about 0.50 packs per day. She has never used smokeless tobacco. She reports that she does not drink alcohol or use illicit drugs.  Allergies  Allergen Reactions  . Linzess [Linaclotide]     EXPLOSIVE DIARRHEA  . Oxycodone     Causes hyperactivity   . Sulfonamide Derivatives Swelling and Rash    Hands & feet swell    Family History  Problem Relation Age of Onset  . Coronary artery disease      family hx of  . Colon  cancer Sister 72  . Colon cancer Daughter   . Prostate cancer      family hx of  . Lung cancer      family hx of  . Ovarian cancer      family hx of  . Arthritis      family hx of  . Other      family hx of chronic respiratory condition  . Heart disease Mother   . Stroke Mother      Prior to Admission medications   Medication Sig Start Date End Date Taking? Authorizing Provider  albuterol (PROAIR HFA) 108 (90 BASE) MCG/ACT inhaler Inhale 2 puffs into the lungs every 6 (six) hours as needed. For wheezing   Yes Historical Provider, MD  ALPRAZolam Duanne Moron) 1 MG tablet Take 0.5-1 mg by mouth 2 (  two) times daily. Take 0.5mg  in the morning & 1mg  at night   Yes Historical Provider, MD  amLODipine (NORVASC) 5 MG tablet Take 5 mg by mouth daily.   Yes Historical Provider, MD  atorvastatin (LIPITOR) 20 MG tablet Take 20 mg by mouth daily.     Yes Historical Provider, MD  clopidogrel (PLAVIX) 75 MG tablet TAKE 1 TABLET DAILY 11/23/13  Yes Burnell Blanks, MD  DULoxetine (CYMBALTA) 60 MG capsule Take 1 capsule by mouth daily. 08/25/13  Yes Historical Provider, MD  ERGOCALCIFEROL IM Inject 1 Units into the muscle every 30 (thirty) days.   Yes Historical Provider, MD  gabapentin (NEURONTIN) 300 MG capsule Take 300-600 mg by mouth 2 (two) times daily. Take 300mg  in the morning & 600mg  at bedtime 07/24/12  Yes Historical Provider, MD  JANUVIA 50 MG tablet Take 50 mg by mouth daily.  07/04/12  Yes Historical Provider, MD  levothyroxine (SYNTHROID, LEVOTHROID) 112 MCG tablet Take 112 mcg by mouth daily before breakfast.   Yes Historical Provider, MD  metoCLOPramide (REGLAN) 5 MG tablet Take 5 mg by mouth 4 (four) times daily.   Yes Historical Provider, MD  pantoprazole (PROTONIX) 40 MG tablet Take 40 mg by mouth 2 (two) times daily.    Yes Historical Provider, MD  TAMIFLU 75 MG capsule Take 75 mg by mouth daily. 11/29/13  Yes Historical Provider, MD   Physical Exam: Filed Vitals:   12/05/13 1400   BP: 123/50  Pulse: 82  Temp:   Resp: 23    BP 123/50  Pulse 82  Temp(Src) 98.9 F (37.2 C) (Oral)  Resp 23  Ht 5\' 9"  (1.753 m)  Wt 81.647 kg (180 lb)  BMI 26.57 kg/m2  SpO2 95%  General:  Appears calm and comfortable Eyes: PERRL, normal lids, irises & conjunctiva ENT: Ears are clear nose without drainage oropharynx without erythema or exudate his membranes of her mouth are moist and pink very poor dentition Neck: no LAD, masses or thyromegaly Cardiovascular: RRR, no m/r/g. No LE edema. Pedal pulses are present and palpable. Left anterior chest tender to palpation Respiratory: Normal effort. Fairly good air flow throughout. Faint expiratory wheezes throughout. No crackles. Abdomen: soft nondistended nontender to palpation positive bowel sounds throughout Skin: no rash or induration seen on limited exam Musculoskeletal: grossly normal tone BUE/BLE Psychiatric: grossly normal mood and affect, speech fluent and appropriate Neurologic: Speech clear facial           Labs on Admission:  Basic Metabolic Panel:  Recent Labs Lab 12/05/13 1259  NA 141  K 3.0*  CL 100  CO2 28  GLUCOSE 126*  BUN 14  CREATININE 0.92  CALCIUM 9.0   Liver Function Tests: No results found for this basename: AST, ALT, ALKPHOS, BILITOT, PROT, ALBUMIN,  in the last 168 hours No results found for this basename: LIPASE, AMYLASE,  in the last 168 hours No results found for this basename: AMMONIA,  in the last 168 hours CBC:  Recent Labs Lab 12/05/13 1259  WBC 11.2*  NEUTROABS 7.1  HGB 13.3  HCT 40.4  MCV 93.5  PLT 211   Cardiac Enzymes:  Recent Labs Lab 12/05/13 1259  TROPONINI <0.30    BNP (last 3 results)  Recent Labs  12/05/13 1259  PROBNP 250.1   CBG: No results found for this basename: GLUCAP,  in the last 168 hours  Radiological Exams on Admission: Dg Chest Portable 1 View  12/05/2013   CLINICAL DATA:  Chest pain  EXAM: PORTABLE CHEST - 1 VIEW  COMPARISON:  11/28/2013   FINDINGS: Postop lobectomy on the right. No recurrent mass. Lungs are clear without infiltrate or effusion.  IMPRESSION: No active disease.   Electronically Signed   By: Franchot Gallo M.D.   On: 12/05/2013 13:10    EKG: Independently reviewed EKG with normal sinus rhythm intermittent PVCs left bundle branch block Assessment/Plan Principal Problem:   COPD exacerbation: Will admit to telemetry. We'll provide nebulizer treatments and Solu-Medrol. Also start Levaquin. Provide oxygen supplementation and monitor oxygen saturation levels. Oxygen saturation levels currently 95-98% on 2 L. Active Problems: Chest pain: Likely related to #1 and associated coughing. Pain is reproducible. Initial troponin negative and we'll cycle 2 more. Will monitor on telemetry. Chart review indicate left heart cath in 2012 that showed mild to moderate stable disease. He is on Plavix per Dr. Maurene Capes for lifetime according to chart. She had been on aspirin in the past but was taken off of this in October of 2014. Continue her statin.    Hypokalemia: Likely related to decreased by mouth intake. Will replete and recheck in the a.m.   HYPERTENSION, BENIGN: Systolic blood pressure range in the emergency department is 123 to 130s. I medications include amlodipine and we will continue this.    COPD: Continues to smoke. It has oxygen but does not wear it routinely at home. See #1    GERD: Stable continue Reglan and proton experienced    ANEMIA, NORMOCYTIC: Stable     Tobacco abuse: Counseled regarding cessation     Code Status: full Family Communication: sylvia lovelace daughter on phone Disposition Plan: home with daughter in 2 days or so.  Time spent: 65 minutes  [facility] Pager [phone]

## 2013-12-05 NOTE — H&P (Signed)
Patient seen, independently examined and chart reviewed. I agree with exam, assessment and plan discussed with Dyanne Carrel, NP.  80 year old woman who present with increasing shortness of breath and cough. Coughing spells began 1/20 and shortness of breath worsened. With the coughing spells she developed intermittent subxiphoid and left inframammary pain worsened by coughing and exquisitely tender to touch.  PMH Coronary artery disease COPD Cigarette smoker Esophageal dysmotility Diabetes mellitus with gastroparesis Lung cancer  She feels somewhat better after treatment in the emergency department. Still short of breath and coughing with audible wheezes.  Afebrile, vital signs stable. Mild hypoxia. She appears calm and comfortable lying in bed. Sits up easily. Appears vigorous. Able to speak in full sentences. Mild increased respiratory effort. Cardiovascular regular rate and rhythm. No murmur rub or gallop. No lower extremity edema. Speech fluent and clear.  Hypokalemic. Troponin negative. CBC unremarkable. Chest x-ray no acute disease. EKG shows sinus rhythm, left bundle branch block, frequent PVCs. Left bundle branch block old compared to previous studies 06/2013 and 02/2013.  Plan treatment for COPD exacerbation with steroids, nebulizers and antibiotics. Her chest pain is associated with coughing and is reducible. Favor musculoskeletal etiology. Cycle troponins, if negative no further evaluation suggested. Replace potassium and check magnesium monitor on telemetry.  Murray Hodgkins, MD [facility] [phone]

## 2013-12-06 LAB — CBC
HCT: 35.9 % — ABNORMAL LOW (ref 36.0–46.0)
Hemoglobin: 12.2 g/dL (ref 12.0–15.0)
MCH: 31.2 pg (ref 26.0–34.0)
MCHC: 34 g/dL (ref 30.0–36.0)
MCV: 91.8 fL (ref 78.0–100.0)
PLATELETS: 218 10*3/uL (ref 150–400)
RBC: 3.91 MIL/uL (ref 3.87–5.11)
RDW: 12.8 % (ref 11.5–15.5)
WBC: 12 10*3/uL — AB (ref 4.0–10.5)

## 2013-12-06 LAB — BASIC METABOLIC PANEL
BUN: 21 mg/dL (ref 6–23)
CO2: 25 mEq/L (ref 19–32)
Calcium: 9.2 mg/dL (ref 8.4–10.5)
Chloride: 103 mEq/L (ref 96–112)
Creatinine, Ser: 0.98 mg/dL (ref 0.50–1.10)
GFR calc Af Amer: 60 mL/min — ABNORMAL LOW (ref >90)
GFR, EST NON AFRICAN AMERICAN: 52 mL/min — AB (ref >90)
Glucose, Bld: 199 mg/dL — ABNORMAL HIGH (ref 70–99)
Potassium: 3.6 mEq/L — ABNORMAL LOW (ref 3.7–5.3)
SODIUM: 141 meq/L (ref 137–147)

## 2013-12-06 LAB — TROPONIN I: Troponin I: <0.3 ng/mL (ref <0.30)

## 2013-12-06 LAB — MAGNESIUM: Magnesium: 2.1 mg/dL (ref 1.5–2.5)

## 2013-12-06 LAB — GLUCOSE, CAPILLARY
GLUCOSE-CAPILLARY: 152 mg/dL — AB (ref 70–99)
GLUCOSE-CAPILLARY: 194 mg/dL — AB (ref 70–99)
GLUCOSE-CAPILLARY: 144 mg/dL — AB (ref 70–99)
GLUCOSE-CAPILLARY: 148 mg/dL — AB (ref 70–99)

## 2013-12-06 LAB — HEMOGLOBIN A1C
Hgb A1c MFr Bld: 5.9 % — ABNORMAL HIGH (ref <5.7)
Mean Plasma Glucose: 123 mg/dL — ABNORMAL HIGH (ref <117)

## 2013-12-06 MED ORDER — INSULIN ASPART 100 UNIT/ML ~~LOC~~ SOLN: 0.0000 [IU] | Freq: Every day | SUBCUTANEOUS | Status: DC

## 2013-12-06 MED ORDER — INSULIN ASPART 100 UNIT/ML ~~LOC~~ SOLN
0.0000 [IU] | Freq: Three times a day (TID) | SUBCUTANEOUS | Status: DC
  Administered 2013-12-06: 2 [IU] via SUBCUTANEOUS
  Administered 2013-12-07: 3 [IU] via SUBCUTANEOUS
  Administered 2013-12-07: 2 [IU] via SUBCUTANEOUS

## 2013-12-06 MED ORDER — PREDNISONE 20 MG PO TABS
40.0000 mg | ORAL_TABLET | Freq: Every day | ORAL | Status: DC
  Administered 2013-12-07: 40 mg via ORAL
  Filled 2013-12-06: qty 2

## 2013-12-06 MED ORDER — POTASSIUM CHLORIDE CRYS ER 20 MEQ PO TBCR
40.0000 meq | EXTENDED_RELEASE_TABLET | Freq: Once | ORAL | Status: AC
  Administered 2013-12-06: 40 meq via ORAL
  Filled 2013-12-06: qty 2

## 2013-12-06 NOTE — Progress Notes (Signed)
12/06/13 1713 Patient ambulated in hallway with nursing staff standby assist. Tolerated fair, ambulated approximately 100 feet. General weakness. Donavan Foil, RN

## 2013-12-06 NOTE — Progress Notes (Signed)
[facility] PROGRESS NOTE  Madison Henson HDQ:[id number] DOB: [birth year] DOA: 12/05/2013 PCP: Delphina Cahill, MD  Assessment/Plan: COPD exacerbation:  Improved this am. Continue nebulizer treatments and Solu-Medrol. Will change solumedrol to prednisone tomorrow. Levaquin day #2. s. Oxygen saturation levels currently 95-98% on 2 L.  Active Problems:  Chest pain: Likely related to #1 and associated coughing. Pain is reproducible. Troponin negative. Left heart cath in 2012 that showed mild to moderate stable disease. she is on Plavix per Dr. Maurene Capes for lifetime according to chart. She had been on aspirin in the past but was taken off of this in October of 2014. Continue her statin.   Hypokalemia: Improved. Magnesium level within the limits of normal. Likely related to decreased by mouth intake. Will replete and recheck in the a.m.   HYPERTENSION, BENIGN: Systolic blood pressure range in the emergency department is 123 to 130s. I medications include amlodipine and we will continue this.   COPD: Continues to smoke. Has oxygen but does not wear it routinely at home. See #1   GERD: Stable continue Reglan and proton experienced  ANEMIA, NORMOCYTIC: Stable  Tobacco abuse: Counseled regarding cessation    Code Status: full Family Communication: none Disposition Plan: home likely tomorrow   Consultants:  none  Procedures:  none  Antibiotics:  Levaquin 12/06/13 >>  HPI/Subjective: Sitting on side of bed. Reports feeling better and breathing better. Complains of continuous coughing.  Objective: Filed Vitals:   12/06/13 0554  BP: 111/66  Pulse: 67  Temp: 97.4 F (36.3 C)  Resp: 19    Intake/Output Summary (Last 24 hours) at 12/06/13 1350 Last data filed at 12/06/13 0814  Gross per 24 hour  Intake    363 ml  Output      0 ml  Net    363 ml   Filed Weights   12/05/13 1240 12/05/13 1629 12/06/13 0554  Weight: 81.647 kg (180 lb) 83.2 kg (183 lb 6.8 oz) 83 kg (182 lb 15.7 oz)     Exam:   General:  Thin NAD  Cardiovascular: RRR No MGR No LE edema  Respiratory: normal effort BS with diffuse rhonchi. No wheeze no crackles  Abdomen: soft non-distended +BS non-tender  Musculoskeletal: no clubbing or cyanosis   Data Reviewed: Basic Metabolic Panel:  Recent Labs Lab 12/05/13 1259 12/05/13 2054 12/06/13 0521  NA 141  --  141  K 3.0*  --  3.6*  CL 100  --  103  CO2 28  --  25  GLUCOSE 126*  --  199*  BUN 14  --  21  CREATININE 0.92  --  0.98  CALCIUM 9.0  --  9.2  MG  --  2.0 2.1   Liver Function Tests: No results found for this basename: AST, ALT, ALKPHOS, BILITOT, PROT, ALBUMIN,  in the last 168 hours No results found for this basename: LIPASE, AMYLASE,  in the last 168 hours No results found for this basename: AMMONIA,  in the last 168 hours CBC:  Recent Labs Lab 12/05/13 1259 12/06/13 0521  WBC 11.2* 12.0*  NEUTROABS 7.1  --   HGB 13.3 12.2  HCT 40.4 35.9*  MCV 93.5 91.8  PLT 211 218   Cardiac Enzymes:  Recent Labs Lab 12/05/13 1259 12/05/13 2054 12/06/13 0521  TROPONINI <0.30 <0.30 <0.30   BNP (last 3 results)  Recent Labs  12/05/13 1259  PROBNP 250.1   CBG:  Recent Labs Lab 12/05/13 1702 12/05/13 2140 12/06/13 0738 12/06/13 1108  GLUCAP 223* 339* 152* 194*    No results found for this or any previous visit (from the past 240 hour(s)).   Studies: Dg Chest Portable 1 View  12/05/2013   CLINICAL DATA:  Chest pain  EXAM: PORTABLE CHEST - 1 VIEW  COMPARISON:  11/28/2013  FINDINGS: Postop lobectomy on the right. No recurrent mass. Lungs are clear without infiltrate or effusion.  IMPRESSION: No active disease.   Electronically Signed   By: Franchot Gallo M.D.   On: 12/05/2013 13:10    Scheduled Meds: . ALPRAZolam  0.5 mg Oral Daily   And  . ALPRAZolam  1 mg Oral QHS  . amLODipine  5 mg Oral Daily  . atorvastatin  20 mg Oral Daily  . clopidogrel  75 mg Oral Q breakfast  . DULoxetine  60 mg Oral Daily  .  enoxaparin (LOVENOX) injection  40 mg Subcutaneous Q24H  . gabapentin  300 mg Oral Daily   And  . gabapentin  600 mg Oral QHS  . insulin aspart  0-15 Units Subcutaneous TID WC  . insulin aspart  0-5 Units Subcutaneous QHS  . ipratropium-albuterol  3 mL Nebulization Q4H  . levofloxacin  750 mg Oral Daily  . levothyroxine  112 mcg Oral QAC breakfast  . methylPREDNISolone (SOLU-MEDROL) injection  60 mg Intravenous Q12H  . metoCLOPramide  5 mg Oral QID  . pantoprazole  40 mg Oral BID  . sodium chloride  3 mL Intravenous Q12H  . sodium chloride  3 mL Intravenous Q12H   Continuous Infusions:   Principal Problem:   COPD exacerbation Active Problems:   ANEMIA, NORMOCYTIC   HYPERTENSION, BENIGN   COPD   GERD   Tobacco abuse   Chest pain   Hypokalemia    Time spent: 30 minutes    [facility] Pager [phone]. If 7PM-7AM, please contact night-coverage at [email or web address], password [facility] 12/06/2013, 1:50 PM  LOS: 1 day

## 2013-12-06 NOTE — Progress Notes (Signed)
Patient seen, independently examined and chart reviewed. I agree with exam, assessment and plan discussed with Dyanne Carrel, NP.  She feels about the same today. Still has some pleuritic pain from coughing and exhalation.  Afebrile, vital signs stable. Hypoxia is resolved. She appears better. Lungs with diffuse wheezing, fair air movement. No rhonchi or rales. Mild increased respiratory effort. Heart sounds grossly normal. Telemetry sinus rhythm.  Capillary blood sugars stable. Laboratory studies reviewed. Urinalysis grossly positive. Urine culture pending.  She appears to be clinically improving. Continue treatment for COPD exacerbation, plan to change to oral prednisone tomorrow. Continues on Levaquin for COPD exacerbation. Followup urine culture, likely home next 48 hours. Discontinue telemetry.  Murray Hodgkins, MD [facility] [phone]

## 2013-12-06 NOTE — Progress Notes (Signed)
Utilization Review Complete  

## 2013-12-06 NOTE — Progress Notes (Signed)
Inpatient Diabetes Program Recommendations  AACE/ADA: New Consensus Statement on Inpatient Glycemic Control (2013)  Target Ranges:  Prepandial:   less than 140 mg/dL      Peak postprandial:   less than 180 mg/dL (1-2 hours)      Critically ill patients:  140 - 180 mg/dL   Results for ANICIA, LEUTHOLD (MRN [id number]) as of 12/06/2013 09:00  Ref. Range 12/05/2013 17:02 12/05/2013 21:40 12/06/2013 07:38  Glucose-Capillary Latest Range: 70-99 mg/dL 223 (H) 339 (H) 152 (H)    Inpatient Diabetes Program Recommendations Correction (SSI): Please increase Novolog correction to moderate scale and may want to consider adding HS correction scale.  Thanks, Barnie Alderman, RN, MSN, CCRN Diabetes Coordinator Inpatient Diabetes Program [phone] (Team Pager) [phone] ([facility] office) [phone] [facility] office)

## 2013-12-06 NOTE — Plan of Care (Signed)
Problem: Phase I Progression Outcomes Goal: Progress activity as tolerated unless otherwise ordered Outcome: Progressing 12215 [id number] Patient ambulates in room, bathroom privileges. Unsteady at times, shaky when first getting out of bed. Instructed to call for assistance and not attempt getting up on her own. Call light kept within reach, demonstrates correct use, waits for nursing staff to arrive. bedalarm on for safety. Donavan Foil, RN

## 2013-12-07 DIAGNOSIS — K5900 Constipation, unspecified: Secondary | ICD-10-CM | POA: Diagnosis present

## 2013-12-07 DIAGNOSIS — F17200 Nicotine dependence, unspecified, uncomplicated: Secondary | ICD-10-CM

## 2013-12-07 DIAGNOSIS — N390 Urinary tract infection, site not specified: Secondary | ICD-10-CM | POA: Diagnosis present

## 2013-12-07 LAB — BASIC METABOLIC PANEL
BUN: 24 mg/dL — ABNORMAL HIGH (ref 6–23)
CALCIUM: 9 mg/dL (ref 8.4–10.5)
CO2: 25 mEq/L (ref 19–32)
Chloride: 104 mEq/L (ref 96–112)
Creatinine, Ser: 1.06 mg/dL (ref 0.50–1.10)
GFR, EST AFRICAN AMERICAN: 55 mL/min — AB (ref >90)
GFR, EST NON AFRICAN AMERICAN: 47 mL/min — AB (ref >90)
Glucose, Bld: 171 mg/dL — ABNORMAL HIGH (ref 70–99)
POTASSIUM: 4.4 meq/L (ref 3.7–5.3)
SODIUM: 141 meq/L (ref 137–147)

## 2013-12-07 LAB — URINE CULTURE: Colony Count: >=100000

## 2013-12-07 LAB — GLUCOSE, CAPILLARY
Glucose-Capillary: 128 mg/dL — ABNORMAL HIGH (ref 70–99)
Glucose-Capillary: 163 mg/dL — ABNORMAL HIGH (ref 70–99)

## 2013-12-07 MED ORDER — PREDNISONE 10 MG PO TABS: ORAL_TABLET | ORAL | Status: DC

## 2013-12-07 MED ORDER — LEVOFLOXACIN 750 MG PO TABS: 750.0000 mg | ORAL_TABLET | Freq: Every day | ORAL | Status: DC

## 2013-12-07 MED ORDER — GUAIFENESIN-DM 100-10 MG/5ML PO SYRP: 5.0000 mL | ORAL_SOLUTION | ORAL | Status: DC | PRN

## 2013-12-07 MED ORDER — MILK AND MOLASSES ENEMA
1.0000 | Freq: Once | RECTAL | Status: AC
  Administered 2013-12-07: 250 mL via RECTAL

## 2013-12-07 NOTE — Care Management Note (Signed)
    Page 1 of 1   12/07/2013     2:01:52 PM   CARE MANAGEMENT NOTE 12/07/2013  Patient:  DYNA, FIGUEREO   Account Number:  [id number]  Date Initiated:  12/07/2013  Documentation initiated by:  Claretha Cooper  Subjective/Objective Assessment:   Pt admitted form home where she lives with her daughter. Pt states she could benefit from [facility] RN and PT. Selected [facility].     Action/Plan:   Anticipated DC Date:  12/07/2013   Anticipated DC Plan:  [place]  CM consult      Choice offered to / List presented to:          [facility] arranged  HH-1 RN  [place] PT      [place].   Status of service:  Completed, signed off Medicare Important Message given?  NA - LOS <3 / Initial given by admissions (If response is "NO", the following Medicare IM given date fields will be blank) Date Medicare IM given:   Date Additional Medicare IM given:    Discharge Disposition:  [place]  Per UR Regulation:    If discussed at Long Length of Stay Meetings, dates discussed:    Comments:  12/07/13 Claretha Cooper RN BSN CM Romualdo Bolk alerted to new referral for [facility] RN and PT

## 2013-12-07 NOTE — Discharge Summary (Signed)
Physician Discharge Summary  Madison Henson ZDG:[id number] DOB: 1928-08-28 DOA: 12/05/2013  PCP: Delphina Cahill, MD  Admit date: 12/05/2013 Discharge date: 12/07/2013  Time spent: 40 minutes  Recommendations for Outpatient Follow-up:  1. Dr. Nevada Crane in 1 week for evaluation of respiratory status and cough.   Discharge Diagnoses:  Principal Problem:   COPD exacerbation Active Problems:   ANEMIA, NORMOCYTIC   HYPERTENSION, BENIGN   COPD   GERD   Tobacco abuse   Chest pain   Hypokalemia   Unspecified constipation   UTI (urinary tract infection)   Discharge Condition: stable  Diet recommendation: heart healthy  Filed Weights   12/05/13 1629 12/06/13 0554 12/07/13 0422  Weight: 83.2 kg (183 lb 6.8 oz) 83 kg (182 lb 15.7 oz) 86.9 kg (191 lb 9.3 oz)    History of present illness:  Madison Henson is a very pleasant 80 y.o. female with a past medical history that includes CAD, COPD, CHF, hypertension, MI presented to the emergency room on 12/05/13 with the chief complaint of worsening cough and chest pain. Patient reported that 7 days prior she developed a productive cough so she went to the urgent care Center. She indicated that she was given some medication she is unsure of what medication that she took that she got no better. sHe stated the cough continued to worsen she became more short of breath. 3 days prior she developed intermittent left anterior chest pain as well. Other associated symptoms include chills, nausea, vomiting, generalized weakness and palpitations. She reported sharp intermittent pain located under her left breast radiating to her left side particularly when she coughs. She denied abdominal pain diarrhea constipation melena or bright red blood per rectum. She denied dysuria hematuria frequency or urgency She stated that she has oxygen at home but that she doesn't usually wear it. She had been wearing it for the previous couple days without much relief. She also owns a  nebulizer machine but she had not given herself nebs because they make her nauseated. Initial workup in the emergency department yielded a white count of 11.2 potassium of 3.0 serum glucose of 126, proBNP 250, lactic acid 1.7 otherwise her lab work is unremarkable. Chest x-ray yielded clear lungs without mass or infiltrate. EKG yielded normal sinus rhythm with PVCs left bundle branch block no significant changes. Her vital signs were stable and she was not hypoxic. In the emergency room she was provided with albuterol nebulizer treatments, Zithromax, and Solu-Medrol.   Hospital Course:  COPD exacerbation: Admitted and provided with nebulizer treatments and Solu-Medrol and Levaquin.. She quickly improved.  Oxygen saturation levels at discharge  currently 92-955 on room air. Improved air flow at discharge. Recommend follow up with PCP 1 week for evaluation of respiratory status.   Active Problems:  Chest pain: Likely related to #1 and associated coughing. Pain is reproducible. Troponin negative. Left heart cath in 2012 that showed mild to moderate stable disease. she is on Plavix per Dr. Maurene Capes for lifetime according to chart. She had been on aspirin in the past but was taken off of this in October of 2014. No events on tele.   Hypokalemia: Resolved at discharge.  Magnesium level within the limits of normal. Likely related to decreased by mouth intake.    HYPERTENSION, BENIGN: controlled   COPD: Continues to smoke. Has oxygen but does not wear it routinely at home. See #1   GERD: Stable continue Reglan and PPI  ANEMIA, NORMOCYTIC: Stable   Tobacco abuse: Counseled  regarding cessation    Procedures:    Consultations:  none  Discharge Exam: Filed Vitals:   12/07/13 0422  BP: 96/31  Pulse: 73  Temp:   Resp:     General: alert calm appears comfortable Cardiovascular: RRR No MGR No LE edema Respiratory: normal effort BS with good air flow. No wheeze.  Discharge Instructions    Future Appointments Provider Department Dept Phone   10/08/2014 9:00 AM [facility] [place] [facility] [address] [phone]   Eat a light meal the night before the exam Nothing to eat or drink for at least 8 hours before exam No gum chewing, or smoking the morning of the exam. Please take your morning medications with small sips of water, especially blood pressure medication *Very Important* Please wear 2 piece clothing   10/08/2014 9:40 AM Sharmon Leyden Nickel, NP [facility] [phone]       Medication List    STOP taking these medications       TAMIFLU 75 MG capsule  Generic drug:  oseltamivir      TAKE these medications       ALPRAZolam 1 MG tablet  Commonly known as:  XANAX  Take 0.5-1 mg by mouth 2 (two) times daily. Take 0.5mg  in the morning & 1mg  at night     amLODipine 5 MG tablet  Commonly known as:  NORVASC  Take 5 mg by mouth daily.     atorvastatin 20 MG tablet  Commonly known as:  LIPITOR  Take 20 mg by mouth daily.     clopidogrel 75 MG tablet  Commonly known as:  PLAVIX  TAKE 1 TABLET DAILY     DULoxetine 60 MG capsule  Commonly known as:  CYMBALTA  Take 1 capsule by mouth daily.     ERGOCALCIFEROL IM  Inject 1 Units into the muscle every 30 (thirty) days.     gabapentin 300 MG capsule  Commonly known as:  NEURONTIN  Take 300-600 mg by mouth 2 (two) times daily. Take 300mg  in the morning & 600mg  at bedtime     guaiFENesin-dextromethorphan 100-10 MG/5ML syrup  Commonly known as:  ROBITUSSIN DM  Take 5 mLs by mouth every 4 (four) hours as needed for cough.     JANUVIA 50 MG tablet  Generic drug:  sitaGLIPtin  Take 50 mg by mouth daily.     levofloxacin 750 MG tablet  Commonly known as:  LEVAQUIN  Take 1 tablet (750 mg total) by mouth daily.     levothyroxine 112 MCG tablet  Commonly known as:  SYNTHROID, LEVOTHROID  Take 112 mcg by mouth daily before breakfast.     metoCLOPramide 5 MG tablet  Commonly  known as:  REGLAN  Take 5 mg by mouth 4 (four) times daily.     pantoprazole 40 MG tablet  Commonly known as:  PROTONIX  Take 40 mg by mouth 2 (two) times daily.     predniSONE 10 MG tablet  Commonly known as:  DELTASONE  Take 3 tabs 1/24 take 2 tabs 1/25 take 1 tab 1/26 then stop.     PROAIR HFA 108 (90 BASE) MCG/ACT inhaler  Generic drug:  albuterol  Inhale 2 puffs into the lungs every 6 (six) hours as needed. For wheezing       Allergies  Allergen Reactions  . Linzess [Linaclotide]     EXPLOSIVE DIARRHEA  . Oxycodone     Causes hyperactivity   . Sulfonamide Derivatives Swelling and  Rash    Hands & feet swell      The results of significant diagnostics from this hospitalization (including imaging, microbiology, ancillary and laboratory) are listed below for reference.    Significant Diagnostic Studies: Dg Abd 1 View  11/14/2013   ADDENDUM REPORT: 11/14/2013 10:55  ADDENDUM: Endoscopy capsule is noted in the right lower quadrant, likely in the distal small bowel.   Electronically Signed   By: Rolm Baptise M.D.   On: 11/14/2013 10:55   11/14/2013   CLINICAL DATA:  Abdominal pain.  EXAM: ABDOMEN - 1 VIEW  COMPARISON:  None.  FINDINGS: Air is seen within nondilated loops of large small bowel. A moderate amount of stool is appreciated within the colon. A rectangular shaped metallic appearing density projects in the right lower quadrant. This may represent an external artifact. Atherosclerotic calcification ill-defined within the aorta and mesenteric vessels. Dextroscoliosis is appreciated within the lumbar spine.  IMPRESSION: Nonobstructive bowel gas pattern with moderate amount of stool. Metallic density projecting within the right lower quadrant is may represent an external artifact. Finding is not appreciated on a prior CT of the abdomen pelvis dated 07/09/2013.  Electronically Signed: By: Margaree Mackintosh M.D. On: 11/13/2013 08:27   Dg Chest Portable 1 View  12/05/2013    CLINICAL DATA:  Chest pain  EXAM: PORTABLE CHEST - 1 VIEW  COMPARISON:  11/28/2013  FINDINGS: Postop lobectomy on the right. No recurrent mass. Lungs are clear without infiltrate or effusion.  IMPRESSION: No active disease.   Electronically Signed   By: Franchot Gallo M.D.   On: 12/05/2013 13:10    Microbiology: Recent Results (from the past 240 hour(s))  URINE CULTURE     Status: None   Collection Time    12/05/13  8:36 PM      Result Value Range Status   Specimen Description URINE, CLEAN CATCH   Final   Special Requests NONE   Final   Culture  Setup Time     Final   Value: 12/06/2013 13:48     Performed at [place]     Final   Value: >=100,000 COLONIES/ML     Performed at [facility]   Culture     Final   Value: Multiple bacterial morphotypes present, none predominant. Suggest appropriate recollection if clinically indicated.     Performed at [facility]   Report Status 12/07/2013 FINAL   Final     Labs: Basic Metabolic Panel:  Recent Labs Lab 12/05/13 1259 12/05/13 2054 12/06/13 0521 12/07/13 0543  NA 141  --  141 141  K 3.0*  --  3.6* 4.4  CL 100  --  103 104  CO2 28  --  25 25  GLUCOSE 126*  --  199* 171*  BUN 14  --  21 24*  CREATININE 0.92  --  0.98 1.06  CALCIUM 9.0  --  9.2 9.0  MG  --  2.0 2.1  --    Liver Function Tests: No results found for this basename: AST, ALT, ALKPHOS, BILITOT, PROT, ALBUMIN,  in the last 168 hours No results found for this basename: LIPASE, AMYLASE,  in the last 168 hours No results found for this basename: AMMONIA,  in the last 168 hours CBC:  Recent Labs Lab 12/05/13 1259 12/06/13 0521  WBC 11.2* 12.0*  NEUTROABS 7.1  --   HGB 13.3 12.2  HCT 40.4 35.9*  MCV 93.5 91.8  PLT 211  218   Cardiac Enzymes:  Recent Labs Lab 12/05/13 1259 12/05/13 2054 12/06/13 0521  TROPONINI <0.30 <0.30 <0.30   BNP: BNP (last 3 results)  Recent Labs  12/05/13 1259  PROBNP 250.1    CBG:  Recent Labs Lab 12/06/13 1108 12/06/13 1720 12/06/13 2110 12/07/13 0811 12/07/13 1101  GLUCAP 194* 144* 148* 128* 163*       Signed:  BLACK,KAREN M  [facility] 12/07/2013, 11:58 AM

## 2013-12-07 NOTE — Progress Notes (Deleted)
[facility] PROGRESS NOTE  Madison Henson YQM:[id number] DOB: September 30, 1928 DOA: 12/05/2013 PCP: Delphina Cahill, MD  Assessment/Plan: COPD exacerbation: Continues to improve. Less wheezing. Continue nebulizer treatments. Start prednisone taper. Levaquin day #3. Oxygen saturation levels currently 992-95% on room air.   Active Problems:  Chest pain: Likely related to #1 and associated coughing. Pain is reproducible. Troponin negative. Left heart cath in 2012 that showed mild to moderate stable disease. she is on Plavix per Dr. Maurene Capes for lifetime according to chart. She had been on aspirin in the past but was taken off of this in October of 2014. Continue her statin.   Constipation: complains no BM 3 days. Given MOM. Will offer milk of molasses enema as well. Tolerating diet well. Abdominal exam normal.  Hypokalemia: Resolved. Magnesium level within the limits of normal. Likely related to decreased by mouth intake.   HYPERTENSION, BENIGN: Systolic blood pressure range 95-105.   COPD: Continues to smoke. Has oxygen but does not wear it routinely at home. See #1  GERD: Stable continue Reglan and proton experienced  ANEMIA, NORMOCYTIC: Stable  Tobacco abuse: Counseled regarding cessation   Code Status: full Family Communication: none present Disposition Plan: home in am   Consultants:  none  Procedures:  none  Antibiotics:  levaquin 12/06/13>>  HPI/Subjective: Lying in bed reports "i dont feel well". Complains abdominal pain and no BM for 3 days.   Objective: Filed Vitals:   12/07/13 0422  BP: 96/31  Pulse: 73  Temp:   Resp:     Intake/Output Summary (Last 24 hours) at 12/07/13 0854 Last data filed at 12/07/13 0852  Gross per 24 hour  Intake   1320 ml  Output    300 ml  Net   1020 ml   Filed Weights   12/05/13 1629 12/06/13 0554 12/07/13 0422  Weight: 83.2 kg (183 lb 6.8 oz) 83 kg (182 lb 15.7 oz) 86.9 kg (191 lb 9.3 oz)    Exam:   General:  Appears  comfortable calm  Cardiovascular: RRR No MGR no LE edema  Respiratory: normal effort improved air flow. BS with diffuse rhonchi and few faint expiratory wheeze. No crackles  Abdomen: soft +BS non-tender to palpation  Musculoskeletal: no clubbing or cyanosis. Joints without swelling/erythema    Data Reviewed: Basic Metabolic Panel:  Recent Labs Lab 12/05/13 1259 12/05/13 2054 12/06/13 0521 12/07/13 0543  NA 141  --  141 141  K 3.0*  --  3.6* 4.4  CL 100  --  103 104  CO2 28  --  25 25  GLUCOSE 126*  --  199* 171*  BUN 14  --  21 24*  CREATININE 0.92  --  0.98 1.06  CALCIUM 9.0  --  9.2 9.0  MG  --  2.0 2.1  --    Liver Function Tests: No results found for this basename: AST, ALT, ALKPHOS, BILITOT, PROT, ALBUMIN,  in the last 168 hours No results found for this basename: LIPASE, AMYLASE,  in the last 168 hours No results found for this basename: AMMONIA,  in the last 168 hours CBC:  Recent Labs Lab 12/05/13 1259 12/06/13 0521  WBC 11.2* 12.0*  NEUTROABS 7.1  --   HGB 13.3 12.2  HCT 40.4 35.9*  MCV 93.5 91.8  PLT 211 218   Cardiac Enzymes:  Recent Labs Lab 12/05/13 1259 12/05/13 2054 12/06/13 0521  TROPONINI <0.30 <0.30 <0.30   BNP (last 3 results)  Recent Labs  12/05/13 1259  PROBNP  250.1   CBG:  Recent Labs Lab 12/06/13 0738 12/06/13 1108 12/06/13 1720 12/06/13 2110 12/07/13 0811  GLUCAP 152* 194* 144* 148* 128*    No results found for this or any previous visit (from the past 240 hour(s)).   Studies: Dg Chest Portable 1 View  12/05/2013   CLINICAL DATA:  Chest pain  EXAM: PORTABLE CHEST - 1 VIEW  COMPARISON:  11/28/2013  FINDINGS: Postop lobectomy on the right. No recurrent mass. Lungs are clear without infiltrate or effusion.  IMPRESSION: No active disease.   Electronically Signed   By: Franchot Gallo M.D.   On: 12/05/2013 13:10    Scheduled Meds: . ALPRAZolam  0.5 mg Oral Daily   And  . ALPRAZolam  1 mg Oral QHS  . amLODipine  5  mg Oral Daily  . atorvastatin  20 mg Oral Daily  . clopidogrel  75 mg Oral Q breakfast  . DULoxetine  60 mg Oral Daily  . enoxaparin (LOVENOX) injection  40 mg Subcutaneous Q24H  . gabapentin  300 mg Oral Daily   And  . gabapentin  600 mg Oral QHS  . insulin aspart  0-15 Units Subcutaneous TID WC  . insulin aspart  0-5 Units Subcutaneous QHS  . ipratropium-albuterol  3 mL Nebulization Q4H  . levofloxacin  750 mg Oral Daily  . levothyroxine  112 mcg Oral QAC breakfast  . metoCLOPramide  5 mg Oral QID  . milk and molasses  1 enema Rectal Once  . pantoprazole  40 mg Oral BID  . predniSONE  40 mg Oral Q breakfast  . sodium chloride  3 mL Intravenous Q12H  . sodium chloride  3 mL Intravenous Q12H   Continuous Infusions:   Principal Problem:   COPD exacerbation Active Problems:   ANEMIA, NORMOCYTIC   HYPERTENSION, BENIGN   COPD   GERD   Tobacco abuse   Chest pain   Hypokalemia   Unspecified constipation   UTI (urinary tract infection)    Time spent: 30 minutes    [facility] Pager [phone]. If 7PM-7AM, please contact night-coverage at [email or web address], password [facility] 12/07/2013, 8:54 AM  LOS: 2 days

## 2013-12-07 NOTE — Progress Notes (Signed)
PT Cancellation Note  Patient Details Name: Madison Henson MRN: [id number] DOB: 23-Jan-1928   Cancelled Treatment:    Reason Eval/Treat Not Completed: Other (comment) Dr. Sarajane Jews has verbally cancelled PT evaluation.  Demetrios Isaacs L 12/07/2013, 10:37 AM

## 2013-12-07 NOTE — Progress Notes (Signed)
D/c instructions reviewed with patient.  Verbalized understanding.  Pt awaiting family to pick up around [name], Madison Henson 12/07/2013

## 2013-12-07 NOTE — Progress Notes (Signed)
Nutrition Brief Note  Patient identified on the Malnutrition Screening Tool (MST) Report  Wt Readings from Last 15 Encounters:  12/07/13 191 lb 9.3 oz (86.9 kg)  11/02/13 189 lb (85.73 kg)  11/02/13 189 lb (85.73 kg)  10/24/13 189 lb 9.6 oz (86.002 kg)  10/09/13 188 lb (85.276 kg)  09/25/13 187 lb (84.823 kg)  08/07/13 181 lb (82.101 kg)  08/02/13 187 lb (84.823 kg)  07/10/13 194 lb 8 oz (88.225 kg)  07/04/13 193 lb (87.544 kg)  02/08/13 197 lb (89.359 kg)  12/05/12 195 lb (88.451 kg)  11/16/12 192 lb (87.091 kg)  09/05/12 200 lb (90.719 kg)  07/25/12 195 lb 12.8 oz (88.814 kg)    Body mass index is 28.28 kg/(m^2). Patient meets criteria for overweight based on current BMI. Weight hx stable. Good appetite.  Current diet order is Heart Healthy, patient is consuming approximately 100% of meals at this time. Labs and medications reviewed.   No nutrition interventions warranted at this time. If nutrition issues arise, please consult RD.   Colman Cater MS,RD,CSG,LDN Office: [phone] Pager: [phone]

## 2013-12-07 NOTE — Discharge Summary (Signed)
Patient seen, independently examined and chart reviewed. I agree with exam, assessment and plan discussed with Dyanne Carrel, NP.  She feels better. Breathing better. Still some pain below the left breast "in the rib" with cough. She ambulated well with nursing yesterday.  Afebrile, vital signs stable. No hypoxia. She appears calm and comfortable, lying in bed. Normal respiratory effort. Speech is fluent and clear. Lung sounds have improved with improved air movement and decreased wheezing. Cardiovascular regular rate and rhythm. No murmur, rub or gallop.  Potassium normal. Basic metabolic panel unremarkable. Blood sugars stable.  She continues to improve clinically, COPD exacerbation appears resolved at this point. Plan discharge home today on steroid taper, antibiotics. She has oxygen and bronchodilator at home. Her chest pain is clearly musculoskeletal related to coughing.  Consideration could be given to Spiriva or Atrovent as an outpatient. Recommend smoking cessation.  Murray Hodgkins, MD [facility] [phone]

## 2013-12-18 ENCOUNTER — Telehealth: Payer: Self-pay | Admitting: Cardiovascular Disease

## 2013-12-18 NOTE — Telephone Encounter (Signed)
I spoke with Madison Henson/ she states the pt has c/o abd pain with melena and some blood on toilet paper. Last stent was 2011, she was told to hold plavix till I hear back from Dr Julianne Handler. Pt has HX of Melena/ constipation. I told her to call pcp/ GI for an app/ advice. Please advise regarding plavix.   10/09/2013  History of Present Illness: 80 yo WF with history of CAD, HTN, Hyperlipidemia, DM, GERD, anxiety/depression here today for cardiac follow up. She has been followed in the past by Dr. Olevia Perches. 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 A new DES was also placed at that time. She previously had a Cypher stent in the circumflex artery as well. Event monitor several years ago with isolated PVCs and APCs. She has chronic shortness of breath related to her COPD. She had an MRI in June 2011 which showed a 3.6 x 3.6 cm infrarenal aortic aneurysm. She has chronic back pain felt to be secondary lumbar disc bulging but not felt to be a surgical candidate. She also has esophageal strictures and had an esophageal dilatation in 2012. She has been on Simvastatin for years. Her lipids are followed in primary care. Last cardiac cath 07/09/11 which showed mild to moderate stable CAD. She had a right lower lobectomy for stage IA non-small cell lung cancer per Dr. Arlyce Dice in October 2012.

## 2013-12-18 NOTE — Telephone Encounter (Signed)
New message         Pt is on plavix and she had thick dark black stool with blood when she wiped.

## 2013-12-19 NOTE — Telephone Encounter (Signed)
Notified pt that Dr. Julianne Handler advised that she may hold her Plavix.  Pt states she is scheduled to see her GI-Dr. Oneida Alar (in [place]) on 2/18.  States she has had this in past. Will stop Plavix until seen by her GI dr.

## 2013-12-19 NOTE — Telephone Encounter (Signed)
She should hold Plavix if she is having melena. Madison Henson

## 2013-12-24 ENCOUNTER — Telehealth: Payer: Self-pay | Admitting: Cardiovascular Disease

## 2013-12-24 NOTE — Telephone Encounter (Signed)
New message   physical therapy from [facility] calling    patient Still experience inter abdomen  pain & bloody-  black  Stool . No weakness, no chest pain, sob after walking.    Vital sign today 112/58,  76, 24, 96 %  With out oxygen . 96.9 .

## 2013-12-24 NOTE — Telephone Encounter (Signed)
Spoke with becky, referred to dr fields, pt's GI doctor.

## 2013-12-26 ENCOUNTER — Telehealth: Payer: Self-pay | Admitting: Gastroenterology

## 2013-12-26 ENCOUNTER — Emergency Department (HOSPITAL_COMMUNITY)
Admission: EM | Admit: 2013-12-26 | Discharge: 2013-12-26 | Disposition: A | Discharge status: Home / Self Care | Payer: Medicare Other | Attending: Emergency Medicine | Admitting: Emergency Medicine

## 2013-12-26 ENCOUNTER — Encounter (HOSPITAL_COMMUNITY): Payer: Self-pay | Admitting: Emergency Medicine

## 2013-12-26 ENCOUNTER — Emergency Department (HOSPITAL_COMMUNITY): Payer: Medicare Other

## 2013-12-26 DIAGNOSIS — R110 Nausea: Secondary | ICD-10-CM | POA: Insufficient documentation

## 2013-12-26 DIAGNOSIS — E785 Hyperlipidemia, unspecified: Secondary | ICD-10-CM | POA: Insufficient documentation

## 2013-12-26 DIAGNOSIS — F3289 Other specified depressive episodes: Secondary | ICD-10-CM | POA: Insufficient documentation

## 2013-12-26 DIAGNOSIS — E119 Type 2 diabetes mellitus without complications: Secondary | ICD-10-CM | POA: Insufficient documentation

## 2013-12-26 DIAGNOSIS — F329 Major depressive disorder, single episode, unspecified: Secondary | ICD-10-CM | POA: Insufficient documentation

## 2013-12-26 DIAGNOSIS — F17200 Nicotine dependence, unspecified, uncomplicated: Secondary | ICD-10-CM | POA: Insufficient documentation

## 2013-12-26 DIAGNOSIS — F411 Generalized anxiety disorder: Secondary | ICD-10-CM | POA: Insufficient documentation

## 2013-12-26 DIAGNOSIS — K219 Gastro-esophageal reflux disease without esophagitis: Secondary | ICD-10-CM | POA: Insufficient documentation

## 2013-12-26 DIAGNOSIS — I509 Heart failure, unspecified: Secondary | ICD-10-CM | POA: Insufficient documentation

## 2013-12-26 DIAGNOSIS — Z862 Personal history of diseases of the blood and blood-forming organs and certain disorders involving the immune mechanism: Secondary | ICD-10-CM | POA: Insufficient documentation

## 2013-12-26 DIAGNOSIS — K5900 Constipation, unspecified: Secondary | ICD-10-CM | POA: Insufficient documentation

## 2013-12-26 DIAGNOSIS — Z90710 Acquired absence of both cervix and uterus: Secondary | ICD-10-CM | POA: Insufficient documentation

## 2013-12-26 DIAGNOSIS — E039 Hypothyroidism, unspecified: Secondary | ICD-10-CM | POA: Insufficient documentation

## 2013-12-26 DIAGNOSIS — I10 Essential (primary) hypertension: Secondary | ICD-10-CM | POA: Insufficient documentation

## 2013-12-26 DIAGNOSIS — J449 Chronic obstructive pulmonary disease, unspecified: Secondary | ICD-10-CM | POA: Insufficient documentation

## 2013-12-26 DIAGNOSIS — J4489 Other specified chronic obstructive pulmonary disease: Secondary | ICD-10-CM | POA: Insufficient documentation

## 2013-12-26 DIAGNOSIS — Z79899 Other long term (current) drug therapy: Secondary | ICD-10-CM | POA: Insufficient documentation

## 2013-12-26 DIAGNOSIS — I2510 Atherosclerotic heart disease of native coronary artery without angina pectoris: Secondary | ICD-10-CM | POA: Insufficient documentation

## 2013-12-26 DIAGNOSIS — Z85118 Personal history of other malignant neoplasm of bronchus and lung: Secondary | ICD-10-CM | POA: Insufficient documentation

## 2013-12-26 DIAGNOSIS — K921 Melena: Secondary | ICD-10-CM | POA: Insufficient documentation

## 2013-12-26 DIAGNOSIS — Z9861 Coronary angioplasty status: Secondary | ICD-10-CM | POA: Insufficient documentation

## 2013-12-26 DIAGNOSIS — Z7902 Long term (current) use of antithrombotics/antiplatelets: Secondary | ICD-10-CM | POA: Insufficient documentation

## 2013-12-26 DIAGNOSIS — I252 Old myocardial infarction: Secondary | ICD-10-CM | POA: Insufficient documentation

## 2013-12-26 DIAGNOSIS — R109 Unspecified abdominal pain: Secondary | ICD-10-CM

## 2013-12-26 LAB — CBC WITH DIFFERENTIAL/PLATELET
BASOS PCT: 0 % (ref 0–1)
Basophils Absolute: 0 10*3/uL (ref 0.0–0.1)
EOS ABS: 0.2 10*3/uL (ref 0.0–0.7)
Eosinophils Relative: 2 % (ref 0–5)
HEMATOCRIT: 37.3 % (ref 36.0–46.0)
Hemoglobin: 12.3 g/dL (ref 12.0–15.0)
Lymphocytes Relative: 23 % (ref 12–46)
Lymphs Abs: 1.6 10*3/uL (ref 0.7–4.0)
MCH: 31 pg (ref 26.0–34.0)
MCHC: 33 g/dL (ref 30.0–36.0)
MCV: 94 fL (ref 78.0–100.0)
MONO ABS: 0.6 10*3/uL (ref 0.1–1.0)
Monocytes Relative: 8 % (ref 3–12)
NEUTROS PCT: 68 % (ref 43–77)
Neutro Abs: 4.8 10*3/uL (ref 1.7–7.7)
Platelets: 271 10*3/uL (ref 150–400)
RBC: 3.97 MIL/uL (ref 3.87–5.11)
RDW: 12.8 % (ref 11.5–15.5)
WBC: 7.1 10*3/uL (ref 4.0–10.5)

## 2013-12-26 LAB — COMPREHENSIVE METABOLIC PANEL
ALBUMIN: 3.2 g/dL — AB (ref 3.5–5.2)
ALK PHOS: 102 U/L (ref 39–117)
ALT: 6 U/L (ref 0–35)
AST: 9 U/L (ref 0–37)
BILIRUBIN TOTAL: 0.3 mg/dL (ref 0.3–1.2)
BUN: 10 mg/dL (ref 6–23)
CHLORIDE: 106 meq/L (ref 96–112)
CO2: 29 mEq/L (ref 19–32)
Calcium: 8.8 mg/dL (ref 8.4–10.5)
Creatinine, Ser: 1.17 mg/dL — ABNORMAL HIGH (ref 0.50–1.10)
GFR calc non Af Amer: 42 mL/min — ABNORMAL LOW (ref >90)
GFR, EST AFRICAN AMERICAN: 49 mL/min — AB (ref >90)
GLUCOSE: 97 mg/dL (ref 70–99)
POTASSIUM: 4.1 meq/L (ref 3.7–5.3)
Sodium: 145 mEq/L (ref 137–147)
TOTAL PROTEIN: 6.8 g/dL (ref 6.0–8.3)

## 2013-12-26 LAB — LIPASE, BLOOD: Lipase: 21 U/L (ref 11–59)

## 2013-12-26 MED ORDER — POLYETHYLENE GLYCOL 3350 17 G PO PACK: 17.0000 g | PACK | Freq: Every day | ORAL | Status: AC

## 2013-12-26 MED ORDER — ONDANSETRON 4 MG PO TBDP
4.0000 mg | ORAL_TABLET | Freq: Once | ORAL | Status: AC
  Administered 2013-12-26: 4 mg via ORAL
  Filled 2013-12-26: qty 1

## 2013-12-26 NOTE — ED Provider Notes (Signed)
CSN: [id number]     Arrival date & time 12/26/13  1556 History  This chart was scribed for [employer]. Alvino Chapel, MD by Rolanda Lundborg, ED Scribe. This patient was seen in room APA09/APA09 and the patient's care was started at 4:15 PM.    Chief Complaint  Patient presents with  . Abdominal Pain    The history is provided by the patient. No language interpreter was used.   HPI Comments: Madison Henson is a 80 y.o. female with a h/o hemorrhoids, hysterectomy, cholecystectomy, AAA, DM who presents to the Emergency Department complaining of bloody stools, constipation, and bilateral lower quadrant abdominal pain for the last month. She states she cannot have a BM unless she uses a suppository and it is only as big as her pinky finger. She reports blood on the toilet paper after defecating and black stools. She reports bloating every morning. She also reports nausea and vomiting. Her last colonoscopy was in December by Dr Oneida Alar. She is on hydrocodone. She denies significant weight loss, CP.   Past Medical History  Diagnosis Date  . Coronary artery disease     post diaphragmatic wall infarction on 06-2007,due to stent thrombosis   . Chronic obstructive pulmonary disease   . Tobacco abuse   . Hypertension   . Hyperlipidemia   . GERD (gastroesophageal reflux disease)   . Anxiety disorder   . Depression   . Hypothyroidism   . Hx of hysterectomy   . AAA (abdominal aortic aneurysm)   . Pulmonary nodule 04/2011    neg bx, followed by Dr. Arlyce Dice, may still be cancer, being followed with CTs.  . Esophageal dysmotility     Two BPE since 03/2011-->Moderate impairment of esophageal motility.Vallecular residuals. Laryngeal penetration  . Anemia   . Diabetes mellitus     with gastroparesis 70% retention at 2 hours  . Cancer   . Lung cancer   . CHF (congestive heart failure)   . Myocardial infarction   . AAA (abdominal aortic aneurysm) without rupture 09/25/2013   Past Surgical History  Procedure  Laterality Date  . Cholecystectomy    . Cataract surg    . Bladder surgery      tack  . Kidney stone surgery    . Coronary stent placement  2004    2  . Coronary stent placement  2008    2  . S/p hysterectomy    . Neck surgery    . Esophagogastroduodenoscopy  5/08    normal esophagus, No H.Pylori  . Egd/bravo  07/2008    On Nexium BID. Day 1 DMSTR Score: 0.7, day 2: DMSTR score: 32, uncontrolled GERD, No H. Pylori  . Hbt  2009    negative  . Colonoscopy  05/2010    tortuous sigm colon, sigm tics, multiple simple adenomas, hemorrhoids, next TCS 10-15 yrs per Dr. Oneida Alar. Previous h/o tubular adenomas in 2008.   Marland Kitchen Esophagogastroduodenoscopy  05/2010    small hh, streaky erythema in body/antrum. Duodenal bx negative.   . Esophagogastroduodenoscopy  06/22/2011    mild gastritis/ring in the distal esophagus, savary dilation. Bx reactive gastropathy, No H.pylori  . Savory dilation  06/22/2011    Procedure: SAVORY DILATION;  Surgeon: Dorothyann Peng, MD;  [facility];  Service: Endoscopy;  Laterality: N/A;  [name] dilation  06/22/2011    Procedure: [name] DILATION;  Surgeon: Dorothyann Peng, MD;  [facility];  Service: Endoscopy;  Laterality: N/A;  . Video brochoscopy with  electromagnetic navigaton  05/10/2011    [name]  . Right lower lobe superior segmentectomy  October 2012  . Hysterectomy at age 45    . Cardiac catheterization    . Abdominal hysterectomy    . Esophagogastroduodenoscopy N/A 08/14/2013    Procedure: ESOPHAGOGASTRODUODENOSCOPY (EGD);  Surgeon: Danie Binder, MD;  [facility];  Service: Endoscopy;  Laterality: N/A;  2:45  . Colonoscopy N/A 11/02/2013    Procedure: COLONOSCOPY;  Surgeon: Danie Binder, MD;  [facility];  Service: Endoscopy;  Laterality: N/A;  1:15  . Hemorrhoid banding N/A 11/02/2013    Procedure: HEMORRHOID BANDING;  Surgeon: Danie Binder, MD;  [facility];  Service: Endoscopy;   Laterality: N/A;  . Agile capsule N/A 11/12/2013    Procedure: AGILE CAPSULE;  Surgeon: Danie Binder, MD;  [facility];  Service: Endoscopy;  Laterality: N/A;  8:00  . Bacterial overgrowth test N/A 11/16/2013    Procedure: BACTERIAL OVERGROWTH TEST;  Surgeon: Danie Binder, MD;  [facility];  Service: Endoscopy;  Laterality: N/A;  8:00   Family History  Problem Relation Age of Onset  . Coronary artery disease      family hx of  . Colon cancer Sister 67  . Colon cancer Daughter   . Prostate cancer      family hx of  . Lung cancer      family hx of  . Ovarian cancer      family hx of  . Arthritis      family hx of  . Other      family hx of chronic respiratory condition  . Heart disease Mother   . Stroke Mother    History  Substance Use Topics  . Smoking status: Current Every Day Smoker -- 0.50 packs/day    Types: Cigarettes  . Smokeless tobacco: Never Used     Comment: She smokes 3 cigarettes daily  . Alcohol Use: No   OB History   Grav Para Term Preterm Abortions TAB SAB Ect Mult Living                 Review of Systems  Constitutional: Negative for unexpected weight change.  Cardiovascular: Negative for chest pain.  Gastrointestinal: Positive for nausea, vomiting, abdominal pain, constipation and blood in stool.   A complete 10 system review of systems was obtained and all systems are negative except as noted in the HPI and PMH.     Allergies  Linzess; Oxycodone; and Sulfonamide derivatives  Home Medications   Current Outpatient Rx  Name  Route  Sig  Dispense  Refill  . albuterol (PROAIR HFA) 108 (90 BASE) MCG/ACT inhaler   Inhalation   Inhale 2 puffs into the lungs every 6 (six) hours as needed. For wheezing         . ALPRAZolam (XANAX) 0.5 MG tablet   Oral   Take 0.25-0.5 mg by mouth 2 (two) times daily. Patient takes a 1/2 tablet in the morning and 1 tablet at night         . amLODipine (NORVASC) 5 MG tablet   Oral   Take  5 mg by mouth daily.         Marland Kitchen atorvastatin (LIPITOR) 20 MG tablet   Oral   Take 20 mg by mouth daily.           . clopidogrel (PLAVIX) 75 MG tablet   Oral   Take  75 mg by mouth daily.         . cyanocobalamin (,VITAMIN B-12,) 1000 MCG/ML injection   Intramuscular   Inject 1,000 mcg into the muscle every 30 (thirty) days.         Marland Kitchen gabapentin (NEURONTIN) 300 MG capsule   Oral   Take 300-600 mg by mouth 2 (two) times daily. Take 300mg  in the morning & 600mg  at bedtime         . HYDROcodone-acetaminophen (NORCO) 7.5-325 MG per tablet   Oral   Take 1 tablet by mouth every 6 (six) hours as needed. pain         . JANUVIA 50 MG tablet   Oral   Take 50 mg by mouth daily.          Marland Kitchen levothyroxine (SYNTHROID, LEVOTHROID) 112 MCG tablet   Oral   Take 112 mcg by mouth daily before breakfast.         . pantoprazole (PROTONIX) 40 MG tablet   Oral   Take 40 mg by mouth 2 (two) times daily.          . ranitidine (ZANTAC) 150 MG tablet   Oral   Take 150 mg by mouth at bedtime.         . ERGOCALCIFEROL IM   Intramuscular   Inject 1 Units into the muscle every 30 (thirty) days.         . polyethylene glycol (MIRALAX / GLYCOLAX) packet   Oral   Take 17 g by mouth daily.   14 each   0    BP 129/68  Pulse 78  Temp(Src) 98.3 F (36.8 C)  Resp 18  Ht 5\' 9"  (1.753 m)  Wt 180 lb (81.647 kg)  BMI 26.57 kg/m2  SpO2 100% Physical Exam  Nursing note and vitals reviewed. Constitutional: She is oriented to person, place, and time. She appears well-developed and well-nourished. No distress.  HENT:  Head: Normocephalic and atraumatic.  Eyes: EOM are normal.  Neck: Neck supple. No tracheal deviation present.  Cardiovascular: Normal rate.   Pulmonary/Chest: Effort normal and breath sounds normal. No respiratory distress.  Abdominal:  Lower to mid fullness with some tenderness. Midline scar. No hernias palpated.  Musculoskeletal: Normal range of motion. She  exhibits no edema (No peripheral edema).  Neurological: She is alert and oriented to person, place, and time.  Skin: Skin is warm and dry. No pallor.  Psychiatric: She has a normal mood and affect. Her behavior is normal.    ED Course  Procedures (including critical care time) Medications  ondansetron (ZOFRAN-ODT) disintegrating tablet 4 mg (4 mg Oral Given 12/26/13 1731)    COORDINATION OF CARE: 4:21 PM- Discussed treatment plan with pt which includes abdominal x-ray and blood work. Pt agrees to plan.  Results for orders placed during the hospital encounter of 12/26/13  CBC WITH DIFFERENTIAL      Result Value Ref Range   WBC 7.1  4.0 - 10.5 K/uL   RBC 3.97  3.87 - 5.11 MIL/uL   Hemoglobin 12.3  12.0 - 15.0 g/dL   HCT 37.3  36.0 - 46.0 %   MCV 94.0  78.0 - 100.0 fL   MCH 31.0  26.0 - 34.0 pg   MCHC 33.0  30.0 - 36.0 g/dL   RDW 12.8  11.5 - 15.5 %   Platelets 271  150 - 400 K/uL   Neutrophils Relative % 68  43 - 77 %   Neutro Abs 4.8  1.7 - 7.7 K/uL   Lymphocytes Relative 23  12 - 46 %   Lymphs Abs 1.6  0.7 - 4.0 K/uL   Monocytes Relative 8  3 - 12 %   Monocytes Absolute 0.6  0.1 - 1.0 K/uL   Eosinophils Relative 2  0 - 5 %   Eosinophils Absolute 0.2  0.0 - 0.7 K/uL   Basophils Relative 0  0 - 1 %   Basophils Absolute 0.0  0.0 - 0.1 K/uL  COMPREHENSIVE METABOLIC PANEL      Result Value Ref Range   Sodium 145  137 - 147 mEq/L   Potassium 4.1  3.7 - 5.3 mEq/L   Chloride 106  96 - 112 mEq/L   CO2 29  19 - 32 mEq/L   Glucose, Bld 97  70 - 99 mg/dL   BUN 10  6 - 23 mg/dL   Creatinine, Ser 1.17 (*) 0.50 - 1.10 mg/dL   Calcium 8.8  8.4 - 10.5 mg/dL   Total Protein 6.8  6.0 - 8.3 g/dL   Albumin 3.2 (*) 3.5 - 5.2 g/dL   AST 9  0 - 37 U/L   ALT 6  0 - 35 U/L   Alkaline Phosphatase 102  39 - 117 U/L   Total Bilirubin 0.3  0.3 - 1.2 mg/dL   GFR calc non Af Amer 42 (*) >90 mL/min   GFR calc Af Amer 49 (*) >90 mL/min  LIPASE, BLOOD      Result Value Ref Range   Lipase 21  11  - 59 U/L   Dg Chest Portable 1 View  12/05/2013   CLINICAL DATA:  Chest pain  EXAM: PORTABLE CHEST - 1 VIEW  COMPARISON:  11/28/2013  FINDINGS: Postop lobectomy on the right. No recurrent mass. Lungs are clear without infiltrate or effusion.  IMPRESSION: No active disease.   Electronically Signed   By: Franchot Gallo M.D.   On: 12/05/2013 13:10   Dg Abd 2 Views  12/26/2013   CLINICAL DATA:  Abdominal pain for 1 month, history of renal stones  EXAM: ABDOMEN - 2 VIEW  COMPARISON:  DG ABDOMEN 1V dated 11/13/2013; CT ABD/PELVIS W CM dated 07/09/2013  FINDINGS: Large colonic stool burden without evidence of obstruction. No pneumoperitoneum, pneumatosis or portal venous gas.  Post cholecystectomy.  There is an indeterminate linear punctate (approximately 5 mm) opacity overlying the expected location of the left renal fossa. There is a less well-defined approximately 6 mm opacity which overlies expected location of the right renal fossa. No definitive opacities overlie the expected location of either ureter. Multiple phleboliths overlie the urinary bladder.  Vascular calcifications overlying the left upper abdominal quadrant.  Moderate scoliotic curvature of the thoracolumbar spine with associated multilevel moderate to severe DDD. Degenerative changes pubic symphysis.  IMPRESSION: 1. Possible bilateral nephrolithiasis. Further evaluation could be performed with noncontrast abdominal CT as clinically indicated. 2. Large colonic stool burden without evidence of obstruction.   Electronically Signed   By: Sandi Mariscal M.D.   On: 12/26/2013 17:32       EKG Interpretation   None       MDM   Final diagnoses:  Abdominal pain  Constipation    Patient with lower abdominal pain and difficulty having bowel movements. His been going for a month or 2. She sees gastroenterology, who told her to come to the ED. I discussed it with Dr. Oneida Alar. Lab work reassuring. She will see the patient in followup. No gross  blood  on rectal exam. Guaiac negative. There are some external hemorrhoids. No mass palpated. No fecal obstruction.  I personally performed the services described in this documentation, which was scribed in my presence. The recorded information has been reviewed and is accurate.    Jasper Riling. Alvino Chapel, MD 12/26/13 [phone]

## 2013-12-26 NOTE — Discharge Instructions (Signed)
Abdominal Pain, Adult Many things can cause abdominal pain. Usually, abdominal pain is not caused by a disease and will improve without treatment. It can often be observed and treated at home. Your health care provider will do a physical exam and possibly order blood tests and X-rays to help determine the seriousness of your pain. However, in many cases, more time must pass before a clear cause of the pain can be found. Before that point, your health care provider may not know if you need more testing or further treatment. HOME CARE INSTRUCTIONS  Monitor your abdominal pain for any changes. The following actions may help to alleviate any discomfort you are experiencing:  Only take over-the-counter or prescription medicines as directed by your health care provider.  Do not take laxatives unless directed to do so by your health care provider.  Try a clear liquid diet (broth, tea, or water) as directed by your health care provider. Slowly move to a bland diet as tolerated. SEEK MEDICAL CARE IF:  You have unexplained abdominal pain.  You have abdominal pain associated with nausea or diarrhea.  You have pain when you urinate or have a bowel movement.  You experience abdominal pain that wakes you in the night.  You have abdominal pain that is worsened or improved by eating food.  You have abdominal pain that is worsened with eating fatty foods. SEEK IMMEDIATE MEDICAL CARE IF:   Your pain does not go away within 2 hours.  You have a fever.  You keep throwing up (vomiting).  Your pain is felt only in portions of the abdomen, such as the right side or the left lower portion of the abdomen.  You pass bloody or black tarry stools. MAKE SURE YOU:  Understand these instructions.   Will watch your condition.   Will get help right away if you are not doing well or get worse.  Document Released: 08/11/2005 Document Revised: 08/22/2013 Document Reviewed: 07/11/2013 [facility] Patient  Information 2014 [place].  Constipation, Adult Constipation is when a person has fewer than 3 bowel movements a week; has difficulty having a bowel movement; or has stools that are dry, hard, or larger than normal. As people grow older, constipation is more common. If you try to fix constipation with medicines that make you have a bowel movement (laxatives), the problem may get worse. Long-term laxative use may cause the muscles of the colon to become weak. A low-fiber diet, not taking in enough fluids, and taking certain medicines may make constipation worse. CAUSES   Certain medicines, such as antidepressants, pain medicine, iron supplements, antacids, and water pills.   Certain diseases, such as diabetes, irritable bowel syndrome (IBS), thyroid disease, or depression.   Not drinking enough water.   Not eating enough fiber-rich foods.   Stress or travel.  Lack of physical activity or exercise.  Not going to the restroom when there is the urge to have a bowel movement.  Ignoring the urge to have a bowel movement.  Using laxatives too much. SYMPTOMS   Having fewer than 3 bowel movements a week.   Straining to have a bowel movement.   Having hard, dry, or larger than normal stools.   Feeling full or bloated.   Pain in the lower abdomen.  Not feeling relief after having a bowel movement. DIAGNOSIS  Your caregiver will take a medical history and perform a physical exam. Further testing may be done for severe constipation. Some tests may include:   A barium  enema X-ray to examine your rectum, colon, and sometimes, your small intestine.  A sigmoidoscopy to examine your lower colon.  A colonoscopy to examine your entire colon. TREATMENT  Treatment will depend on the severity of your constipation and what is causing it. Some dietary treatments include drinking more fluids and eating more fiber-rich foods. Lifestyle treatments may include regular exercise. If these  diet and lifestyle recommendations do not help, your caregiver may recommend taking over-the-counter laxative medicines to help you have bowel movements. Prescription medicines may be prescribed if over-the-counter medicines do not work.  HOME CARE INSTRUCTIONS   Increase dietary fiber in your diet, such as fruits, vegetables, whole grains, and beans. Limit high-fat and processed sugars in your diet, such as [place] fries, hamburgers, cookies, candies, and soda.   A fiber supplement may be added to your diet if you cannot get enough fiber from foods.   Drink enough fluids to keep your urine clear or pale yellow.   Exercise regularly or as directed by your caregiver.   Go to the restroom when you have the urge to go. Do not hold it.  Only take medicines as directed by your caregiver. Do not take other medicines for constipation without talking to your caregiver first. [place] IF:   You have bright red blood in your stool.   Your constipation lasts for more than 4 days or gets worse.   You have abdominal or rectal pain.   You have thin, pencil-like stools.  You have unexplained weight loss. MAKE SURE YOU:   Understand these instructions.  Will watch your condition.  Will get help right away if you are not doing well or get worse. Document Released: 07/30/2004 Document Revised: 01/24/2012 Document Reviewed: 08/13/2013 [facility] Patient Information 2014 [place], [place].

## 2013-12-26 NOTE — Telephone Encounter (Signed)
I called pt and she said she has had blood in her stool/toilet for the last 3-4 days. She has had abdominal cramps at her belly button on down and also in her left abdomen. She has not had a good BM in over a week, and has been using suppositories and one fleet enema to have a little BM. She ate a can of chicken dumplings several days ago and that is when her abdominal pain started. Please advise!

## 2013-12-26 NOTE — Telephone Encounter (Signed)
Pt called earlier in a panic that she needed to speak with [facility] nurse. I told her that at the moment we only have the one nurse, but would transfer her to VM. 10 minutes later she calls back and asked if I would just give the nurse the message that she has had a bloody stool, her bottom hurst and is having abd pain. Please call her at [phone]

## 2013-12-26 NOTE — ED Notes (Signed)
Pt c/o worsening upper abd pain with n/v and blood in stool.

## 2013-12-26 NOTE — Telephone Encounter (Signed)
Pt is aware and will be going to the ED.

## 2013-12-26 NOTE — Telephone Encounter (Signed)
PLEASE CALL PT. SHE SHOULD GO TO THE ED FOR AN EVALUATION.

## 2013-12-27 ENCOUNTER — Telehealth: Payer: Self-pay | Admitting: *Deleted

## 2013-12-27 NOTE — Telephone Encounter (Signed)
ALREADY REVIEWED. WILL MAKE RECS THIS AFTERNOON.

## 2013-12-27 NOTE — Telephone Encounter (Signed)
Called and informed pt.  

## 2013-12-27 NOTE — Telephone Encounter (Signed)
Pt seen and evaluated in ed. She has kidney stones and constipation.

## 2013-12-27 NOTE — Telephone Encounter (Signed)
Pt is aware that I will call her when Dr. Oneida Alar reviews her ED reports and makes recommendations.

## 2013-12-27 NOTE — Telephone Encounter (Signed)
Pt called saying she went to the [facility] ED yesterday evening and they did a x-ray of her abd. Pt said the doctor told her to talk with Dr. Oneida Alar about her X-Ray, pt said the doctor told her she has bad hemorrhoids and they are bleeding the doctor also told pt her stomach is swollen, pt says when she goes to the bathroom it looks like dark blood on the stools and when she wipes it is a bright red blood pt says everything she eats makes her stomach hurt and it swells pt said by the end of the day she looks like she is pregnant. Please advise [phone]

## 2013-12-27 NOTE — Telephone Encounter (Signed)
PLEASE CALL PT. HER ED EVALUATION SHOWS KIDNEY STONES AND CONSTIPATION. SHE MOST LIKELY HAS PAIN IN HER BELLY DUE TO CONSTIPATION. SHE SHOULD:   1. START MIRALAX 1 CAP IN 8 OZ OF CLEAR LIQUID AT 5 PM. REPEAT EVERY HOUR FOR 3 HOURS ([id number]). DRINK 8 OZ(1 CUP) OF CLEAR LIQUID 30 MINUTES AFTER EACH DOSE.  2. USE DULCOLAX PILLS 10 MG AT  5 PM AND 7 PM ON TODAY  3. REPEAT TOMORROW IF NO BM TONIGHT.  4.CALL MON IF SHE DOES NOT GET ANY RESULTS.

## 2014-01-02 ENCOUNTER — Ambulatory Visit: Payer: Medicare Other | Admitting: Gastroenterology

## 2014-01-03 ENCOUNTER — Telehealth: Payer: Self-pay

## 2014-01-03 NOTE — Telephone Encounter (Signed)
I entered the date and time with [facility]

## 2014-01-03 NOTE — Telephone Encounter (Signed)
Called and informed pt. Routing to Manuela Schwartz to put appt in computer. Pt is aware of the date and time of appt on 01/09/2014.

## 2014-01-03 NOTE — Telephone Encounter (Signed)
PLEASE CALL PT. SHE NEEDS TO START HER PLAVIX BECAUSE THE BENEFITS OUTWEIGH THE RISKS. HER BLOOD COUNT IS STABLE.SHE'S NOT GONNA BLEED TO DEATH FROMHEMORRHOIDS.  SHE CAN COME SEE ME FEB 25 AT 1130 AM TO DISCUSS ADDITIONAL MANAGEMENT FOR HER RECTAL BLEEDING.

## 2014-01-03 NOTE — Telephone Encounter (Signed)
Pt called and is upset about her Plavix. Madison Henson that a home health nurse came out to see her last week and told her to stop the Plavix until after she had appt with Dr. Oneida Alar on 01/02/2014. Pt had to cancel appt yesterday due to the weather. She said she has not taken the Plavix since last Wed, 12/26/2013.   She is now scheduled to see Dr. Oneida Alar on 01/23/2014 at 8:30.  Please advise!

## 2014-01-09 ENCOUNTER — Ambulatory Visit (INDEPENDENT_AMBULATORY_CARE_PROVIDER_SITE_OTHER): Payer: Medicare Other | Admitting: Gastroenterology

## 2014-01-09 ENCOUNTER — Other Ambulatory Visit: Payer: Self-pay | Admitting: Gastroenterology

## 2014-01-09 ENCOUNTER — Encounter: Payer: Self-pay | Admitting: Gastroenterology

## 2014-01-09 VITALS — BP 120/70 | HR 74 | Temp 98.0°F | Ht 68.0 in | Wt 190.2 lb

## 2014-01-09 DIAGNOSIS — I2510 Atherosclerotic heart disease of native coronary artery without angina pectoris: Secondary | ICD-10-CM

## 2014-01-09 DIAGNOSIS — R1084 Generalized abdominal pain: Secondary | ICD-10-CM

## 2014-01-09 DIAGNOSIS — R109 Unspecified abdominal pain: Secondary | ICD-10-CM

## 2014-01-09 NOTE — Progress Notes (Signed)
Subjective:    Patient ID: Madison Henson, female    DOB: 01-02-1928, 80 y.o.   MRN: [id number]  HPI Every time she eats she feels food go down and stop. WHEN IT EASES ON DOWN IT CAUSES SEVERE PAIN AROUND HER NAVEL AND LOWER ABD. HAS MILD CONSTANT PAIN.  EATING LESS BECAUSE WHEN SHE EATS SHE HAS PAIN. EVALUATION IN ED SHOWED KIDNEY STONES AND CONSTIPATION. GETS SEVERE NAUSEA AND WANTS TO BURP SO BAD BUT CAN'T. NO VOMITING. BEEN LIKE THIS FOR A COUPLE OF MOS. [place] 2014. BOWELS MOVE Q3 DAYS.  Past Medical History  Diagnosis Date  . Coronary artery disease     post diaphragmatic wall infarction on 06-2007,due to stent thrombosis   . Chronic obstructive pulmonary disease   . Tobacco abuse   . Hypertension   . Hyperlipidemia   . GERD (gastroesophageal reflux disease)   . Anxiety disorder   . Depression   . Hypothyroidism   . Hx of hysterectomy   . AAA (abdominal aortic aneurysm)   . Pulmonary nodule 04/2011    neg bx, followed by Dr. Arlyce Dice, may still be cancer, being followed with CTs.  . Esophageal dysmotility     Two BPE since 03/2011-->Moderate impairment of esophageal motility.Vallecular residuals. Laryngeal penetration  . Anemia   . Diabetes mellitus     with gastroparesis 70% retention at 2 hours  . Cancer   . Lung cancer   . CHF (congestive heart failure)   . Myocardial infarction   . AAA (abdominal aortic aneurysm) without rupture 09/25/2013   Past Surgical History  Procedure Laterality Date  . Cholecystectomy    . Cataract surg    . Bladder surgery      tack  . Kidney stone surgery    . Coronary stent placement  2004    2  . Coronary stent placement  2008    2  . S/p hysterectomy    . Neck surgery    . Esophagogastroduodenoscopy  5/08    normal esophagus, No H.Pylori  . Egd/bravo  07/2008    On Nexium BID. Day 1 DMSTR Score: 0.7, day 2: DMSTR score: 32, uncontrolled GERD, No H. Pylori  . Hbt  2009    negative  . Colonoscopy  05/2010   tortuous sigm colon, sigm tics, multiple simple adenomas, hemorrhoids, next TCS 10-15 yrs per Dr. Oneida Alar. Previous h/o tubular adenomas in 2008.   Marland Kitchen Esophagogastroduodenoscopy  05/2010    small hh, streaky erythema in body/antrum. Duodenal bx negative.   . Esophagogastroduodenoscopy  06/22/2011    mild gastritis/ring in the distal esophagus, savary dilation. Bx reactive gastropathy, No H.pylori  . Savory dilation  06/22/2011    Procedure: SAVORY DILATION;  Surgeon: Dorothyann Peng, MD;  Location: [facility];  Service: Endoscopy;  Laterality: N/A;  [name] dilation  06/22/2011    Procedure: [name] DILATION;  Surgeon: Dorothyann Peng, MD;  Location: [facility];  Service: Endoscopy;  Laterality: N/A;  . Video brochoscopy with electromagnetic navigaton  05/10/2011    [name]  . Right lower lobe superior segmentectomy  October 2012  . Hysterectomy at age 67    . Cardiac catheterization    . Abdominal hysterectomy    . Esophagogastroduodenoscopy N/A 08/14/2013    Procedure: ESOPHAGOGASTRODUODENOSCOPY (EGD);  Surgeon: Danie Binder, MD;  [facility]: [facility];  Service: Endoscopy;  Laterality: N/A;  2:45  . Colonoscopy N/A 11/02/2013    Procedure: COLONOSCOPY;  Surgeon: Danie Binder, MD;  [facility]: [facility];  Service: Endoscopy;  Laterality: N/A;  1:15  . Hemorrhoid banding N/A 11/02/2013    Procedure: HEMORRHOID BANDING;  Surgeon: Danie Binder, MD;  [facility]: [facility];  Service: Endoscopy;  Laterality: N/A;  . Agile capsule N/A 11/12/2013    Procedure: AGILE CAPSULE;  Surgeon: Danie Binder, MD;  [facility]: [facility];  Service: Endoscopy;  Laterality: N/A;  8:00  . Bacterial overgrowth test N/A 11/16/2013    Procedure: BACTERIAL OVERGROWTH TEST;  Surgeon: Danie Binder, MD;  [facility]: [facility];  Service: Endoscopy;  Laterality: N/A;  8:00    Allergies  Allergen Reactions  . Linzess [Linaclotide]     EXPLOSIVE DIARRHEA  . Oxycodone     Causes hyperactivity     . Sulfonamide Derivatives Swelling and Rash    Hands & feet swell    Current Outpatient Prescriptions  Medication Sig Dispense Refill  . albuterol (PROAIR HFA) 108 (90 BASE) MCG/ACT inhaler Inhale 2 puffs into the lungs every 6 (six) hours as needed. For wheezing      . ALPRAZolam (XANAX) 0.5 MG tablet Take 0.25-0.5 mg by mouth 2 (two) times daily. Patient takes a 1/2 tablet in the morning and 1 tablet at night      . amLODipine (NORVASC) 5 MG tablet Take 5 mg by mouth daily.      Marland Kitchen atorvastatin (LIPITOR) 20 MG tablet Take 20 mg by mouth daily.        . clopidogrel (PLAVIX) 75 MG tablet Take 75 mg by mouth daily.      . cyanocobalamin (,VITAMIN B-12,) 1000 MCG/ML injection Inject 1,000 mcg into the muscle every 30 (thirty) days.      . ERGOCALCIFEROL IM Inject 1 Units into the muscle every 30 (thirty) days.      Marland Kitchen gabapentin (NEURONTIN) 300 MG capsule Take 300-600 mg by mouth 2 (two) times daily. Take 300mg  in the morning & 600mg  at bedtime      . HYDROcodone-acetaminophen (NORCO) 7.5-325 MG per tablet Take 1 tablet by mouth every 6 (six) hours as needed. pain      . JANUVIA 50 MG tablet Take 50 mg by mouth daily.       Marland Kitchen levothyroxine (SYNTHROID, LEVOTHROID) 112 MCG tablet Take 112 mcg by mouth daily before breakfast.      . pantoprazole (PROTONIX) 40 MG tablet Take 40 mg by mouth 2 (two) times daily.       . polyethylene glycol (MIRALAX / GLYCOLAX) packet Take 17 g by mouth daily.  14 each  0  . ranitidine (ZANTAC) 150 MG tablet Take 150 mg by mouth at bedtime.       No current facility-administered medications for this visit.     Review of Systems     Objective:   Physical Exam        Assessment & Plan:

## 2014-01-09 NOTE — Assessment & Plan Note (Signed)
AFTER EVERY MEAL AND CAUSES PT TO EAT LESS. DIFFERENTIAL DIAGNOSIS INCLUDES FUNCTIONAL ABD PAIN/CONSTIPATION, SMV THROMBUS, LESS LIKELY CHRONIC MESENTERIC ISCHEMIA, OR INTERMITTENTLY GASTRIC/SMALL BOWEL VOLVULUS.  I PERSONALLY REVIEWED 2014 FILMS WITH DR. BLEITZ AND DISCUSSED DIAGNOSTIC EVALUATION. PROCEED WITH CTA ABD/PELVIS. CONSIDER UGI/SBFT IF NO SOURCE FOR POST-PRANDIAL ABD PAIN IDETIFIED, INCREASE MIRALAX TO BID DRINK WATER EAT FIBER OPV IN 2 MOS

## 2014-01-09 NOTE — Patient Instructions (Signed)
COMPLETE CT TO LOOK AT VESSELS IN ABDOMEN AND PELVIS AND LOOK FOR CAUSES OF YOUR ABD PAIN.  INCREASE MIRALAX TO TWICE DAILY.  DRINK WATER TO KEEP YOUR URINE LIGHT YELLOW.  FOLLOW A SOFT MECHANICAL/HIGH FIBER DIET. AVOID ITEMS THAT CAUSE BLOATING & GAS.  MEATS SHOULD BE CHOPPED OR GROUND ONLY. DO NOT EAT CHUNKS OF ANYTHING. SEE INFO BELOW.  FOLLOW UP IN 2 MOS.   SOFT MECHANICAL DIET This SOFT MECHANICAL DIET is restricted to:  Foods that are moist, soft-textured, and easy to chew and swallow.   Meats that are ground or are minced no larger than one-quarter inch pieces. Meats are moist with gravy or sauce added.   Foods that do not include bread or bread-like textures except soft pancakes, well-moistened with syrup or sauce.   Textures with some chewing ability required.   Casseroles without rice.   Cooked vegetables that are less than half an inch in size and easily mashed with a fork. No cooked corn, peas, broccoli, cauliflower, cabbage, Brussels sprouts, asparagus, or other fibrous, non-tender or rubbery cooked vegetables.   Canned fruit except for pineapple. Fruit must be cut into pieces no larger than half an inch in size.   Foods that do not include nuts, seeds, coconut, or sticky textures.   FOOD TEXTURES FOR DYSPHAGIA DIET LEVEL 2 -SOFT MECHANICAL DIET (includes all foods on Dysphagia Diet Level 1 - Pureed, in addition to the foods listed below)  FOOD GROUP: Breads. RECOMMENDED: Soft pancakes, well-moistened with syrup or sauce.  AVOID: All others.  FOOD GROUP: Cereals.  RECOMMENDED: Cooked cereals with little texture, including oatmeal. Unprocessed wheat bran stirred into cereals for bulk. Note: If thin liquids are restricted, it is important that all of the liquid is absorbed into the cereal.  AVOID: All dry cereals and any cooked cereals that may contain flax seeds or other seeds or nuts. Whole-grain, dry, or coarse cereals. Cereals with nuts, seeds, dried fruit,  and/or coconut.  FOOD GROUP: Desserts. RECOMMENDED: Pudding, custard. Soft fruit pies with bottom crust only. Canned fruit (excluding pineapple). Soft, moist cakes with icing.Frozen malts, milk shakes, frozen yogurt, eggnog, nutritional supplements, ice cream, sherbet, regular or sugar-free gelatin, or any foods that become thin liquid at either room (70 F) or body temperature (98 F).  AVOID: Dry, coarse cakes and cookies. Anything with nuts, seeds, coconut, pineapple, or dried fruit. Breakfast yogurt with nuts. Rice or bread pudding.  FOOD GROUP: Fats. RECOMMENDED: Butter, margarine, cream for cereal (depending on liquid consistency recommendations), gravy, cream sauces, sour cream, sour cream dips with soft additives, mayonnaise, salad dressings, cream cheese, cream cheese spreads with soft additives, whipped toppings.  AVOID: All fats with coarse or chunky additives.  FOOD GROUP: Fruits. RECOMMENDED: Soft drained, canned, or cooked fruits without seeds or skin. Fresh soft and ripe banana. Fruit juices with a small amount of pulp. If thin liquids are restricted, fruit juices should be thickened to appropriate consistency.  AVOID: Fresh or frozen fruits. Cooked fruit with skin or seeds. Dried fruits. Fresh, canned, or cooked pineapple.  FOOD GROUP: Meats and Meat Substitutes. (Meat pieces should not exceed 1/4 of an inch cube and should be tender.) RECOMMENDED: Moistened ground or cooked meat, poultry, or fish. Moist ground or tender meat may be served with gravy or sauce. Casseroles without rice. Moist macaroni and cheese, well-cooked pasta with meat sauce, tuna noodle casserole, soft, moist lasagna. Moist meatballs, meatloaf, or fish loaf. Protein salads, such as tuna or egg without large  chunks, celery, or onion. Cottage cheese, smooth quiche without large chunks. Poached, scrambled, or soft-cooked eggs (egg yolks should not be "runny" but should be moist and able to be mashed with butter,  margarine, or other moisture added to them). (Cook eggs to 160 F or use pasteurized eggs for safety.) Souffls may have small, soft chunks. Tofu. Well-cooked, slightly mashed, moist legumes, such as baked beans. All meats or protein substitutes should be served with sauces or moistened to help maintain cohesiveness in the oral cavity.  AVOID: Dry meats, tough meats (such as bacon, sausage, hot dogs, bratwurst). Dry casseroles or casseroles with rice or large chunks. Peanut butter. Cheese slices and cubes. Hard-cooked or crisp fried eggs. Sandwiches.Pizza.  FOOD GROUP: Potatoes and Starches. RECOMMENDED: Well-cooked, moistened, boiled, baked, or mashed potatoes. Well-cooked shredded hash brown potatoes that are not crisp. (All potatoes need to be moist and in sauces.)Well-cooked noodles in sauce. Spaetzel or soft dumplings that have been moistened with butter or gravy.  AVOID: Potato skins and chips. Fried or French-fried potatoes. Rice.  FOOD GROUP: Soups. RECOMMENDED: Soups with easy-to-chew or easy-to-swallow meats or vegetables: Particle sizes in soups should be less than 1/2 inch. Soups will need to be thickened to appropriate consistency if soup is thinner than prescribed liquid consistency.  AVOID: Soups with large chunks of meat and vegetables. Soups with rice, corn, peas.  FOOD GROUP: Vegetables. RECOMMENDED: All soft, well-cooked vegetables. Vegetables should be less than a half inch. Should be easily mashed with a fork.  AVOID: Cooked corn and peas. Broccoli, cabbage, Brussels sprouts, asparagus, or other fibrous, non-tender or rubbery cooked vegetables.  FOOD GROUP: Miscellaneous. RECOMMENDED: Jams and preserves without seeds, jelly. Sauces, salsas, etc., that may have small tender chunks less than 1/2 inch. Soft, smooth chocolate bars that are easily chewed.  AVOID: Seeds, nuts, coconut, or sticky foods. Chewy candies such as caramels or licorice.

## 2014-01-11 NOTE — Progress Notes (Signed)
Reminder in epic °

## 2014-01-15 ENCOUNTER — Ambulatory Visit (HOSPITAL_COMMUNITY): Payer: Medicare Other

## 2014-01-15 NOTE — Progress Notes (Signed)
cc'd to pcp 

## 2014-01-17 ENCOUNTER — Ambulatory Visit (HOSPITAL_COMMUNITY)
Admission: RE | Admit: 2014-01-17 | Discharge: 2014-01-17 | Disposition: A | Discharge status: Home / Self Care | Payer: Medicare Other | Source: Ambulatory Visit | Attending: Gastroenterology | Admitting: Gastroenterology

## 2014-01-17 ENCOUNTER — Telehealth: Payer: Self-pay | Admitting: Gastroenterology

## 2014-01-17 DIAGNOSIS — K5730 Diverticulosis of large intestine without perforation or abscess without bleeding: Secondary | ICD-10-CM | POA: Insufficient documentation

## 2014-01-17 DIAGNOSIS — R1084 Generalized abdominal pain: Secondary | ICD-10-CM

## 2014-01-17 DIAGNOSIS — R109 Unspecified abdominal pain: Secondary | ICD-10-CM | POA: Insufficient documentation

## 2014-01-17 DIAGNOSIS — C3490 Malignant neoplasm of unspecified part of unspecified bronchus or lung: Secondary | ICD-10-CM | POA: Insufficient documentation

## 2014-01-17 DIAGNOSIS — I719 Aortic aneurysm of unspecified site, without rupture: Secondary | ICD-10-CM | POA: Insufficient documentation

## 2014-01-17 DIAGNOSIS — I701 Atherosclerosis of renal artery: Secondary | ICD-10-CM | POA: Insufficient documentation

## 2014-01-17 DIAGNOSIS — I708 Atherosclerosis of other arteries: Secondary | ICD-10-CM | POA: Insufficient documentation

## 2014-01-17 MED ORDER — IOHEXOL 350 MG/ML SOLN
100.0000 mL | Freq: Once | INTRAVENOUS | Status: AC | PRN
  Administered 2014-01-17: 100 mL via INTRAVENOUS

## 2014-01-17 NOTE — Telephone Encounter (Signed)
Pt called this morning to let us know that she had her CT done today and for [place] to call her as soon as we get the results. I assured her that we would be in contact with her.

## 2014-01-21 NOTE — Telephone Encounter (Signed)
Routing to Dr. Fields for results.  

## 2014-01-22 ENCOUNTER — Telehealth: Payer: Self-pay | Admitting: *Deleted

## 2014-01-22 NOTE — Telephone Encounter (Signed)
Pt called wanting to get the results from her CT, Please advise [phone] pt will be leaving her house in 15-20 minutes to go to [place] to her kidney doctor, pt states to call her anytime after 2:00 PM today.

## 2014-01-22 NOTE — Telephone Encounter (Signed)
Pt has been informed of her results.

## 2014-01-22 NOTE — Telephone Encounter (Signed)
Called, many rings and no answer.

## 2014-01-22 NOTE — Telephone Encounter (Addendum)
PLEASE CALL PT. I PERSONALLY REVIEWED HER CT WITH DR HASSELL. WHILE SHE HAS HARDENING OF THE ARTERIES IN HER ABDOMEN. NONE OF FINDINGS SHOULD BE CAUSING HER ABDOMINAL PAIN, SHE MOST LIKELY HAS IBS-C. HER ANEURYSM SIZE IS ABOUT THE SAME. SHE SHOULD FOLLOW UP WITH DR. EARLY FOR SUBSEQUENT MANAGEMENT.

## 2014-01-22 NOTE — Telephone Encounter (Signed)
I called and informed pt.

## 2014-01-23 ENCOUNTER — Ambulatory Visit: Payer: Medicare Other | Admitting: Gastroenterology

## 2014-01-26 ENCOUNTER — Other Ambulatory Visit: Payer: Self-pay | Admitting: Cardiovascular Disease

## 2014-02-05 NOTE — Progress Notes (Signed)
AUG 2014 BARIUM PILL ESOPHAGRAM-NONSPECIFIC ESOPHAGEAL MOTILITY DISORDER

## 2014-02-05 NOTE — Progress Notes (Signed)
REVIEWED.  

## 2014-02-21 ENCOUNTER — Other Ambulatory Visit: Payer: Self-pay | Admitting: Otolaryngology

## 2014-02-21 DIAGNOSIS — R220 Localized swelling, mass and lump, head: Secondary | ICD-10-CM

## 2014-02-21 DIAGNOSIS — R221 Localized swelling, mass and lump, neck: Principal | ICD-10-CM

## 2014-02-27 ENCOUNTER — Inpatient Hospital Stay: Admission: RE | Admit: 2014-02-27 | Payer: Medicare Other | Source: Ambulatory Visit

## 2014-03-05 ENCOUNTER — Inpatient Hospital Stay: Admission: RE | Admit: 2014-03-05 | Payer: Medicare Other | Source: Ambulatory Visit

## 2014-03-06 ENCOUNTER — Emergency Department (HOSPITAL_COMMUNITY): Payer: Medicare Other

## 2014-03-06 ENCOUNTER — Encounter (HOSPITAL_COMMUNITY): Payer: Self-pay | Admitting: Emergency Medicine

## 2014-03-06 ENCOUNTER — Emergency Department (HOSPITAL_COMMUNITY)
Admission: EM | Admit: 2014-03-06 | Discharge: 2014-03-07 | Disposition: A | Discharge status: Home / Self Care | Payer: Medicare Other | Attending: Emergency Medicine | Admitting: Emergency Medicine

## 2014-03-06 DIAGNOSIS — R221 Localized swelling, mass and lump, neck: Secondary | ICD-10-CM

## 2014-03-06 DIAGNOSIS — K219 Gastro-esophageal reflux disease without esophagitis: Secondary | ICD-10-CM | POA: Insufficient documentation

## 2014-03-06 DIAGNOSIS — E785 Hyperlipidemia, unspecified: Secondary | ICD-10-CM | POA: Insufficient documentation

## 2014-03-06 DIAGNOSIS — I10 Essential (primary) hypertension: Secondary | ICD-10-CM | POA: Insufficient documentation

## 2014-03-06 DIAGNOSIS — Z85118 Personal history of other malignant neoplasm of bronchus and lung: Secondary | ICD-10-CM | POA: Insufficient documentation

## 2014-03-06 DIAGNOSIS — J449 Chronic obstructive pulmonary disease, unspecified: Secondary | ICD-10-CM | POA: Insufficient documentation

## 2014-03-06 DIAGNOSIS — F329 Major depressive disorder, single episode, unspecified: Secondary | ICD-10-CM | POA: Insufficient documentation

## 2014-03-06 DIAGNOSIS — I252 Old myocardial infarction: Secondary | ICD-10-CM | POA: Insufficient documentation

## 2014-03-06 DIAGNOSIS — Z79899 Other long term (current) drug therapy: Secondary | ICD-10-CM | POA: Insufficient documentation

## 2014-03-06 DIAGNOSIS — Z7902 Long term (current) use of antithrombotics/antiplatelets: Secondary | ICD-10-CM | POA: Insufficient documentation

## 2014-03-06 DIAGNOSIS — J4489 Other specified chronic obstructive pulmonary disease: Secondary | ICD-10-CM | POA: Insufficient documentation

## 2014-03-06 DIAGNOSIS — F17200 Nicotine dependence, unspecified, uncomplicated: Secondary | ICD-10-CM | POA: Insufficient documentation

## 2014-03-06 DIAGNOSIS — I509 Heart failure, unspecified: Secondary | ICD-10-CM | POA: Insufficient documentation

## 2014-03-06 DIAGNOSIS — Z9889 Other specified postprocedural states: Secondary | ICD-10-CM | POA: Insufficient documentation

## 2014-03-06 DIAGNOSIS — F411 Generalized anxiety disorder: Secondary | ICD-10-CM | POA: Insufficient documentation

## 2014-03-06 DIAGNOSIS — R220 Localized swelling, mass and lump, head: Secondary | ICD-10-CM | POA: Insufficient documentation

## 2014-03-06 DIAGNOSIS — Z9861 Coronary angioplasty status: Secondary | ICD-10-CM | POA: Insufficient documentation

## 2014-03-06 DIAGNOSIS — E119 Type 2 diabetes mellitus without complications: Secondary | ICD-10-CM | POA: Insufficient documentation

## 2014-03-06 DIAGNOSIS — F3289 Other specified depressive episodes: Secondary | ICD-10-CM | POA: Insufficient documentation

## 2014-03-06 DIAGNOSIS — E039 Hypothyroidism, unspecified: Secondary | ICD-10-CM | POA: Insufficient documentation

## 2014-03-06 DIAGNOSIS — I2510 Atherosclerotic heart disease of native coronary artery without angina pectoris: Secondary | ICD-10-CM | POA: Insufficient documentation

## 2014-03-06 DIAGNOSIS — Z862 Personal history of diseases of the blood and blood-forming organs and certain disorders involving the immune mechanism: Secondary | ICD-10-CM | POA: Insufficient documentation

## 2014-03-06 LAB — CBC WITH DIFFERENTIAL/PLATELET
BASOS PCT: 0 % (ref 0–1)
Basophils Absolute: 0 10*3/uL (ref 0.0–0.1)
EOS ABS: 0.2 10*3/uL (ref 0.0–0.7)
EOS PCT: 3 % (ref 0–5)
HCT: 41.6 % (ref 36.0–46.0)
Hemoglobin: 13.5 g/dL (ref 12.0–15.0)
Lymphocytes Relative: 29 % (ref 12–46)
Lymphs Abs: 2.2 10*3/uL (ref 0.7–4.0)
MCH: 30.6 pg (ref 26.0–34.0)
MCHC: 32.5 g/dL (ref 30.0–36.0)
MCV: 94.3 fL (ref 78.0–100.0)
MONOS PCT: 10 % (ref 3–12)
Monocytes Absolute: 0.8 10*3/uL (ref 0.1–1.0)
Neutro Abs: 4.4 10*3/uL (ref 1.7–7.7)
Neutrophils Relative %: 58 % (ref 43–77)
Platelets: 198 10*3/uL (ref 150–400)
RBC: 4.41 MIL/uL (ref 3.87–5.11)
RDW: 13.1 % (ref 11.5–15.5)
WBC: 7.6 10*3/uL (ref 4.0–10.5)

## 2014-03-06 LAB — BASIC METABOLIC PANEL
BUN: 15 mg/dL (ref 6–23)
CO2: 26 meq/L (ref 19–32)
Calcium: 9.3 mg/dL (ref 8.4–10.5)
Chloride: 105 mEq/L (ref 96–112)
Creatinine, Ser: 1.09 mg/dL (ref 0.50–1.10)
GFR calc Af Amer: 53 mL/min — ABNORMAL LOW (ref >90)
GFR calc non Af Amer: 46 mL/min — ABNORMAL LOW (ref >90)
GLUCOSE: 89 mg/dL (ref 70–99)
Potassium: 4.6 mEq/L (ref 3.7–5.3)
SODIUM: 142 meq/L (ref 137–147)

## 2014-03-06 MED ORDER — SODIUM CHLORIDE 0.9 % IV BOLUS (SEPSIS)
1000.0000 mL | Freq: Once | INTRAVENOUS | Status: AC
  Administered 2014-03-06: 1000 mL via INTRAVENOUS

## 2014-03-06 MED ORDER — IOHEXOL 300 MG/ML  SOLN
75.0000 mL | Freq: Once | INTRAMUSCULAR | Status: AC | PRN
  Administered 2014-03-06: 75 mL via INTRAVENOUS

## 2014-03-06 MED ORDER — ACETAMINOPHEN 325 MG PO TABS
650.0000 mg | ORAL_TABLET | Freq: Once | ORAL | Status: AC
  Administered 2014-03-06: 650 mg via ORAL
  Filled 2014-03-06: qty 2

## 2014-03-06 NOTE — ED Provider Notes (Signed)
CSN: [id number]     Arrival date & time 03/06/14  1908 History   First MD Initiated Contact with Patient 03/06/14 1909     Chief Complaint  Patient presents with  . Neck Pain     (Consider location/radiation/quality/duration/timing/severity/associated sxs/prior Treatment) HPI  This is a 80 y.o. female with PMH of CAD, COPD, hypertension, hyperlipidemia, anxiety, depression, AAA, lung cancer, CHF, presenting with mass. Onset 3 weeks ago. Located on the left side of the neck. Persistent, growing. Red, painful, hard. No meds taken. No additional masses have been appreciated. Positive for minor bleeding from the mass. Negative for trouble breathing, trouble swallowing, fever, chest pain, increase in dyspnea. Negative for increased oxygen requirement.  Past Medical History  Diagnosis Date  . Coronary artery disease     post diaphragmatic wall infarction on 06-2007,due to stent thrombosis   . Chronic obstructive pulmonary disease   . Tobacco abuse   . Hypertension   . Hyperlipidemia   . GERD (gastroesophageal reflux disease)   . Anxiety disorder   . Depression   . Hypothyroidism   . Hx of hysterectomy   . AAA (abdominal aortic aneurysm)   . Pulmonary nodule 04/2011    neg bx, followed by Dr. Arlyce Dice, may still be cancer, being followed with CTs.  . Esophageal dysmotility     Two BPE since 03/2011-->Moderate impairment of esophageal motility.Vallecular residuals. Laryngeal penetration  . Anemia   . Diabetes mellitus     with gastroparesis 70% retention at 2 hours  . Cancer   . Lung cancer   . CHF (congestive heart failure)   . Myocardial infarction   . AAA (abdominal aortic aneurysm) without rupture 09/25/2013   Past Surgical History  Procedure Laterality Date  . Cholecystectomy    . Cataract surg    . Bladder surgery      tack  . Kidney stone surgery    . Coronary stent placement  2004    2  . Coronary stent placement  2008    2  . S/p hysterectomy    . Neck surgery    .  Esophagogastroduodenoscopy  5/08    normal esophagus, No H.Pylori  . Egd/bravo  07/2008    On Nexium BID. Day 1 DMSTR Score: 0.7, day 2: DMSTR score: 32, uncontrolled GERD, No H. Pylori  . Hbt  2009    negative  . Colonoscopy  05/2010    tortuous sigm colon, sigm tics, multiple simple adenomas, hemorrhoids, next TCS 10-15 yrs per Dr. Oneida Alar. Previous h/o tubular adenomas in 2008.   Marland Kitchen Esophagogastroduodenoscopy  05/2010    small hh, streaky erythema in body/antrum. Duodenal bx negative.   . Esophagogastroduodenoscopy  06/22/2011    mild gastritis/ring in the distal esophagus, savary dilation. Bx reactive gastropathy, No H.pylori  . Savory dilation  06/22/2011    Procedure: SAVORY DILATION;  Surgeon: Dorothyann Peng, MD;  Location: [facility];  Service: Endoscopy;  Laterality: N/A;  [name] dilation  06/22/2011    Procedure: [name] DILATION;  Surgeon: Dorothyann Peng, MD;  Location: [facility];  Service: Endoscopy;  Laterality: N/A;  . Video brochoscopy with electromagnetic navigaton  05/10/2011    [name]  . Right lower lobe superior segmentectomy  October 2012  . Hysterectomy at age 76    . Cardiac catheterization    . Abdominal hysterectomy    . Esophagogastroduodenoscopy N/A 08/14/2013    Procedure: ESOPHAGOGASTRODUODENOSCOPY (EGD);  Surgeon: Danie Binder, MD;  Location: [facility];  Service: Endoscopy;  Laterality: N/A;  2:45  . Colonoscopy N/A 11/02/2013    Procedure: COLONOSCOPY;  Surgeon: Danie Binder, MD;  Location: [facility];  Service: Endoscopy;  Laterality: N/A;  1:15  . Hemorrhoid banding N/A 11/02/2013    Procedure: HEMORRHOID BANDING;  Surgeon: Danie Binder, MD;  Location: [facility];  Service: Endoscopy;  Laterality: N/A;  . Agile capsule N/A 11/12/2013    Procedure: AGILE CAPSULE;  Surgeon: Danie Binder, MD;  Location: [facility];  Service: Endoscopy;  Laterality: N/A;  8:00  . Bacterial overgrowth test N/A 11/16/2013    Procedure: BACTERIAL  OVERGROWTH TEST;  Surgeon: Danie Binder, MD;  Location: [facility];  Service: Endoscopy;  Laterality: N/A;  8:00   Family History  Problem Relation Age of Onset  . Coronary artery disease      family hx of  . Colon cancer Sister 23  . Colon cancer Daughter   . Prostate cancer      family hx of  . Lung cancer      family hx of  . Ovarian cancer      family hx of  . Arthritis      family hx of  . Other      family hx of chronic respiratory condition  . Heart disease Mother   . Stroke Mother    History  Substance Use Topics  . Smoking status: Current Every Day Smoker -- 0.50 packs/day    Types: Cigarettes  . Smokeless tobacco: Never Used     Comment: She smokes 3 cigarettes daily  . Alcohol Use: No   OB History   Grav Para Term Preterm Abortions TAB SAB Ect Mult Living                 Review of Systems  Constitutional: Negative for fever and chills.  HENT: Negative for facial swelling.   Eyes: Negative for photophobia and pain.  Respiratory: Negative for cough and shortness of breath.   Cardiovascular: Negative for chest pain and leg swelling.  Gastrointestinal: Negative for nausea, vomiting and abdominal pain.  Genitourinary: Negative for dysuria.  Musculoskeletal: Negative for arthralgias.  Skin: Positive for wound.  Neurological: Negative for seizures.  Hematological: Negative for adenopathy.      Allergies  Linzess; Oxycodone; and Sulfonamide derivatives  Home Medications   Prior to Admission medications   Medication Sig Start Date End Date Taking? Authorizing Provider  amLODipine (NORVASC) 5 MG tablet Take 5 mg by mouth daily.   Yes Historical Provider, MD  atorvastatin (LIPITOR) 20 MG tablet Take 20 mg by mouth at bedtime.    Yes Historical Provider, MD  JANUVIA 50 MG tablet Take 50 mg by mouth daily.  07/04/12  Yes Historical Provider, MD  levothyroxine (SYNTHROID, LEVOTHROID) 125 MCG tablet Take 125 mcg by mouth daily before breakfast.   Yes  Historical Provider, MD  pantoprazole (PROTONIX) 40 MG tablet Take 40 mg by mouth 2 (two) times daily.    Yes Historical Provider, MD  ranitidine (ZANTAC) 150 MG tablet Take 150 mg by mouth at bedtime.   Yes Historical Provider, MD  sitaGLIPtin (JANUVIA) 50 MG tablet Take 50 mg by mouth daily.   Yes Historical Provider, MD  albuterol (PROAIR HFA) 108 (90 BASE) MCG/ACT inhaler Inhale 2 puffs into the lungs every 6 (six) hours as needed. For wheezing    Historical Provider, MD  ALPRAZolam Duanne Moron) 0.5 MG tablet Take 0.25-0.5 mg  by mouth 2 (two) times daily. Patient takes a 1/2 tablet in the morning and 1 tablet at night    Historical Provider, MD  clopidogrel (PLAVIX) 75 MG tablet TAKE 1 TABLET DAILY    Burnell Blanks, MD  cyanocobalamin (,VITAMIN B-12,) 1000 MCG/ML injection Inject 1,000 mcg into the muscle every 30 (thirty) days.    Historical Provider, MD  ERGOCALCIFEROL IM Inject 1 Units into the muscle every 30 (thirty) days.    Historical Provider, MD  gabapentin (NEURONTIN) 300 MG capsule Take 300-600 mg by mouth 2 (two) times daily. Take 300mg  in the morning & 600mg  at bedtime 07/24/12   Historical Provider, MD  HYDROcodone-acetaminophen (NORCO) 7.5-325 MG per tablet Take 1 tablet by mouth every 6 (six) hours as needed. pain    Historical Provider, MD  levothyroxine (SYNTHROID, LEVOTHROID) 112 MCG tablet Take 112 mcg by mouth daily before breakfast.    Historical Provider, MD  polyethylene glycol (MIRALAX / GLYCOLAX) packet Take 17 g by mouth daily. 12/26/13   Jasper Riling. Pickering, MD   BP 93/50  Pulse 65  Temp(Src) 97.7 F (36.5 C) (Oral)  Resp 16  Ht 5\' 9"  (1.753 m)  Wt 180 lb (81.647 kg)  BMI 26.57 kg/m2  SpO2 99% Physical Exam  Constitutional: She is oriented to person, place, and time. She appears well-developed and well-nourished. No distress.  HENT:  Head: Normocephalic and atraumatic.  Right Ear: Hearing, tympanic membrane, external ear and ear canal normal.  Left Ear:  Hearing, tympanic membrane, external ear and ear canal normal.  Nose: Nose normal. Right sinus exhibits no maxillary sinus tenderness and no frontal sinus tenderness. Left sinus exhibits no maxillary sinus tenderness and no frontal sinus tenderness.  Mouth/Throat: Uvula is midline and oropharynx is clear and moist. No oropharyngeal exudate.  Oral floor is soft  Eyes: Conjunctivae are normal. Pupils are equal, round, and reactive to light. No scleral icterus.  Neck: Normal range of motion. No tracheal deviation present. No thyromegaly present.  Cardiovascular: Normal rate, regular rhythm and normal heart sounds.  Exam reveals no gallop and no friction rub.   No murmur heard. Pulmonary/Chest: Effort normal and breath sounds normal. No stridor. No respiratory distress. She has no wheezes. She has no rales. She exhibits no tenderness.  Abdominal: Soft. She exhibits no distension and no mass. There is no tenderness. There is no rebound and no guarding.  Musculoskeletal: Normal range of motion. She exhibits no edema.  Neurological: She is alert and oriented to person, place, and time.  Skin: Skin is warm and dry. She is not diaphoretic.  Positive for mass over the mid left sternocleidomastoid. Measures about 3-4 cm in diameter. It is hard. Positive for tenderness to palpation. Negative for drainage at this time.    ED Course  Procedures (including critical care time) Labs Review Labs Reviewed  BASIC METABOLIC PANEL    Imaging Review Ct Soft Tissue Neck W Contrast  03/07/2014   EXAM: CT NECK WITH CONTRAST  TECHNIQUE: Multidetector CT imaging of the neck was performed using the standard protocol following the bolus administration of intravenous contrast.  CONTRAST:  66mL OMNIPAQUE IOHEXOL 300 MG/ML  SOLN  COMPARISON:  Prior PET-CT from 04/16/2011.  FINDINGS: The visualized portions of the posterior fossa are within normal limits. Bilateral lens implants noted within the partially visualized globes.  The paranasal sinuses and mastoid air cells are clear.  The oral cavity is within normal limits without evidence of mass lesion or loculated fluid collection.  Palatine tonsils are normal. Parapharyngeal fat is preserved. Nasopharynx and oropharynx are within normal limits. No retropharyngeal fluid collection. The in epiglottis and vallecula are normal. Supraglottic larynx and hypopharynx are within normal limits. True vocal cords are symmetric bilaterally. Subglottic airway is clear.  An oblong exophytic mass measuring 3.0 x 1.2 x 2.9 cm seen involving the skin and subcutaneous fat of the posterior left neck (series 201, image 36). This lesion lies superficial to the sternocleidomastoid muscle. There is a fairly well-preserved fat plane separating the mass and the underlying muscle without definite evidence of muscular involvement. No pathologically enlarged lymph nodes identified within the neck. Few tiny subcentimeter intra parotid lymph nodes noted within subjacent superficial lobe of the left parotid gland (series 201, image 34).  Prominent atherosclerotic calcifications noted within the visualized aortic arch and at the origin of the great vessels. Prominent vascular calcifications also noted at the origin of the internal carotid arteries bilaterally without definite high-grade flow-limiting stenosis. Visualized superior mediastinum otherwise normal. Thyroid gland within normal limits.  Visualized lungs are clear.  Multilevel degenerative changes noted within the visualized spine, most severe at C6-7. Diffuse osteopenia present. No worrisome lytic or blastic osseous lesions.  IMPRESSION: 1. 3.0 x 1.2 x 2.9 cm skin based mass within the left neck as above. 2. No cervical adenopathy identified within the neck. 3. Moderate scattered calcified atheromatous disease throughout the arterial vasculature of the neck. 4. Moderate to severe multilevel degenerative disc disease, most prevalent at C6-7.   Electronically  Signed   By: Jeannine Boga M.D.   On: 03/07/2014 00:00    MDM   Final diagnoses:  None    This is a 80 y.o. female with PMH of CAD, COPD, hypertension, hyperlipidemia, anxiety, depression, AAA, lung cancer, CHF, presenting with mass. Onset 3 weeks ago. Located on the left side of the neck. Persistent, growing. Red, painful, hard. No meds taken. No additional masses have been appreciated. Positive for minor bleeding from the mass. Negative for trouble breathing, trouble swallowing, fever, chest pain, increase in dyspnea. Negative for increased oxygen requirement.  The patient has been seen by [facility] and they have ordered a CT soft tissue neck. She states that she was told that it is likely cancer.  On examination, the mass seems contained. There are no concerning findings on ear, oral examinations. The uvula is midline. The oral floor is soft. The patient is afebrile. She has full range of motion of her neck without problem, she is able to fully open her mouth without problem.  CT soft tissue neck reveals skin based mass within the left neck, no cervical adenopathy, no invasion of the mass. The patient remains stable.  No further emergent for inpatient evaluation or treatment is indicated at this time.  Pt stable for discharge, FU with [facility].  All questions answered.  Return precautions given.  I have discussed case and care has been guided by my attending physician, Dr. Venora Maples.  Doy Hutching, MD 03/07/14 [phone]

## 2014-03-06 NOTE — ED Notes (Signed)
Pt states she has seen several doctors and has been told she has cancer on her left side of her neck. Pt states that her neck cancer has broken open and is now bleeding, scabbing and has crust. Pt states painful to touch. Pt states no doctor has offered suggestions for taking the cancer off her neck.

## 2014-03-07 NOTE — Discharge Instructions (Signed)
Abscess CAUSES  An abscess occurs when tissue gets infected. This can occur from blockage of oil or sweat glands, infection of hair follicles, or a minor injury to the skin. As the body tries to fight the infection, pus collects in the area and creates pressure under the skin. This pressure causes pain. People with weakened immune systems have difficulty fighting infections and get certain abscesses more often.  SYMPTOMS Usually an abscess develops on the skin and becomes a painful mass that is red, warm, and tender. If the abscess forms under the skin, you may feel a moveable soft area under the skin. Some abscesses break open (rupture) on their own, but most will continue to get worse without care. The infection can spread deeper into the body and eventually into the bloodstream, causing you to feel ill.  DIAGNOSIS  Your caregiver will take your medical history and perform a physical exam. A sample of fluid may also be taken from the abscess to determine what is causing your infection. TREATMENT  Your caregiver may prescribe antibiotic medicines to fight the infection. However, taking antibiotics alone usually does not cure an abscess. Your caregiver may need to make a small cut (incision) in the abscess to drain the pus. In some cases, gauze is packed into the abscess to reduce pain and to continue draining the area. HOME CARE INSTRUCTIONS   Only take over-the-counter or prescription medicines for pain, discomfort, or fever as directed by your caregiver.  If you were prescribed antibiotics, take them as directed. Finish them even if you start to feel better.  If gauze is used, follow your caregiver's directions for changing the gauze.  To avoid spreading the infection:  Keep your draining abscess covered with a bandage.  Wash your hands well.  Do not share personal care items, towels, or whirlpools with others.  Avoid skin contact with others.  Keep your skin and clothes clean around  the abscess.  Keep all follow-up appointments as directed by your caregiver. SEEK MEDICAL CARE IF:   You have increased pain, swelling, redness, fluid drainage, or bleeding.  You have muscle aches, chills, or a general ill feeling.  You have a fever. MAKE SURE YOU:   Understand these instructions.  Will watch your condition.  Will get help right away if you are not doing well or get worse. Document Released: 08/11/2005 Document Revised: 05/02/2012 Document Reviewed: 01/14/2012 [facility] Patient Information 2014 [place]. Discharge instruction given to pt.

## 2014-03-08 ENCOUNTER — Inpatient Hospital Stay: Admission: RE | Admit: 2014-03-08 | Payer: Medicare Other | Source: Ambulatory Visit

## 2014-03-09 NOTE — ED Provider Notes (Signed)
I saw and evaluated the patient, reviewed the resident's note and I agree with the findings and plan.   EKG Interpretation None      Follow up skin based mass with ENT. May benefit from surgical incision. Pain treated. No signs of infection   Ct Soft Tissue Neck W Contrast  03/07/2014   EXAM: CT NECK WITH CONTRAST  TECHNIQUE: Multidetector CT imaging of the neck was performed using the standard protocol following the bolus administration of intravenous contrast.  CONTRAST:  52mL OMNIPAQUE IOHEXOL 300 MG/ML  SOLN  COMPARISON:  Prior PET-CT from 04/16/2011.  FINDINGS: The visualized portions of the posterior fossa are within normal limits. Bilateral lens implants noted within the partially visualized globes. The paranasal sinuses and mastoid air cells are clear.  The oral cavity is within normal limits without evidence of mass lesion or loculated fluid collection. Palatine tonsils are normal. Parapharyngeal fat is preserved. Nasopharynx and oropharynx are within normal limits. No retropharyngeal fluid collection. The in epiglottis and vallecula are normal. Supraglottic larynx and hypopharynx are within normal limits. True vocal cords are symmetric bilaterally. Subglottic airway is clear.  An oblong exophytic mass measuring 3.0 x 1.2 x 2.9 cm seen involving the skin and subcutaneous fat of the posterior left neck (series 201, image 36). This lesion lies superficial to the sternocleidomastoid muscle. There is a fairly well-preserved fat plane separating the mass and the underlying muscle without definite evidence of muscular involvement. No pathologically enlarged lymph nodes identified within the neck. Few tiny subcentimeter intra parotid lymph nodes noted within subjacent superficial lobe of the left parotid gland (series 201, image 34).  Prominent atherosclerotic calcifications noted within the visualized aortic arch and at the origin of the great vessels. Prominent vascular calcifications also noted at the  origin of the internal carotid arteries bilaterally without definite high-grade flow-limiting stenosis. Visualized superior mediastinum otherwise normal. Thyroid gland within normal limits.  Visualized lungs are clear.  Multilevel degenerative changes noted within the visualized spine, most severe at C6-7. Diffuse osteopenia present. No worrisome lytic or blastic osseous lesions.  IMPRESSION: 1. 3.0 x 1.2 x 2.9 cm skin based mass within the left neck as above. 2. No cervical adenopathy identified within the neck. 3. Moderate scattered calcified atheromatous disease throughout the arterial vasculature of the neck. 4. Moderate to severe multilevel degenerative disc disease, most prevalent at C6-7.   Electronically Signed   By: Jeannine Boga M.D.   On: 03/07/2014 00:00    Hoy Morn, MD 03/09/14 0730

## 2014-03-26 ENCOUNTER — Other Ambulatory Visit: Payer: Self-pay | Admitting: Gastroenterology

## 2014-03-26 ENCOUNTER — Other Ambulatory Visit: Payer: Self-pay | Admitting: Cardiovascular Disease

## 2014-03-29 ENCOUNTER — Encounter: Payer: Self-pay | Admitting: Cardiovascular Disease

## 2014-03-29 ENCOUNTER — Ambulatory Visit (INDEPENDENT_AMBULATORY_CARE_PROVIDER_SITE_OTHER): Payer: Medicare Other | Admitting: Cardiovascular Disease

## 2014-03-29 VITALS — BP 118/62 | HR 62 | Ht 69.0 in | Wt 196.0 lb

## 2014-03-29 DIAGNOSIS — I6523 Occlusion and stenosis of bilateral carotid arteries: Secondary | ICD-10-CM

## 2014-03-29 DIAGNOSIS — I714 Abdominal aortic aneurysm, without rupture, unspecified: Secondary | ICD-10-CM

## 2014-03-29 DIAGNOSIS — I2510 Atherosclerotic heart disease of native coronary artery without angina pectoris: Secondary | ICD-10-CM

## 2014-03-29 DIAGNOSIS — I658 Occlusion and stenosis of other precerebral arteries: Secondary | ICD-10-CM

## 2014-03-29 DIAGNOSIS — I6529 Occlusion and stenosis of unspecified carotid artery: Secondary | ICD-10-CM

## 2014-03-29 DIAGNOSIS — Z01810 Encounter for preprocedural cardiovascular examination: Secondary | ICD-10-CM

## 2014-03-29 DIAGNOSIS — Z720 Tobacco use: Secondary | ICD-10-CM

## 2014-03-29 DIAGNOSIS — F17200 Nicotine dependence, unspecified, uncomplicated: Secondary | ICD-10-CM

## 2014-03-29 DIAGNOSIS — I10 Essential (primary) hypertension: Secondary | ICD-10-CM

## 2014-03-29 DIAGNOSIS — E785 Hyperlipidemia, unspecified: Secondary | ICD-10-CM

## 2014-03-29 NOTE — Progress Notes (Signed)
History of Present Illness: 80 yo WF with history of CAD, HTN, Hyperlipidemia, DM, GERD, anxiety/depression here today for cardiac follow up. She has been followed in the past by Dr. Olevia Perches. 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 A new DES was also placed at that time. She previously had a Cypher stent in the circumflex artery as well. Event monitor several years ago with isolated PVCs and APCs. She has chronic shortness of breath related to her COPD. She had an MRI in June 2011 which showed a 3.6 x 3.6 cm infrarenal aortic aneurysm. She has chronic back pain felt to be secondary lumbar disc bulging but not felt to be a surgical candidate. She also has esophageal strictures and had an esophageal dilatation in 2012. She has been on Simvastatin for years. Her lipids are followed in primary care. Last cardiac cath 07/09/11 which showed mild to moderate stable CAD. She had a right lower lobectomy for stage IA non-small cell lung cancer per Dr. Arlyce Dice in October 2012. She has been on Plavix chronically with first generation DES. Her ASA was stopped in 2013. She has been continued on Plavix.   She is here today for follow up. She denies any chest pain or change in her breathing. She has continued to smoke 3 cigarettes per day. She feels well overall except she now has a lesion on her left neck which is felt to be malignancy. She needs removal under general anesthesia at [facility] next week.   Primary Care Physician: Dr. Nevada Crane   Last Lipid Profile: Per primary care   Past Medical History  Diagnosis Date  . Coronary artery disease     post diaphragmatic wall infarction on 06-2007,due to stent thrombosis   . Chronic obstructive pulmonary disease   . Tobacco abuse   . Hypertension   . Hyperlipidemia   . GERD (gastroesophageal reflux disease)   . Anxiety disorder   . Depression   . Hypothyroidism   . Hx of hysterectomy   . AAA (abdominal  aortic aneurysm)   . Pulmonary nodule 04/2011    neg bx, followed by Dr. Arlyce Dice, may still be cancer, being followed with CTs.  . Esophageal dysmotility     Two BPE since 03/2011-->Moderate impairment of esophageal motility.Vallecular residuals. Laryngeal penetration  . Anemia   . Diabetes mellitus     with gastroparesis 70% retention at 2 hours  . Cancer   . Lung cancer   . CHF (congestive heart failure)   . Myocardial infarction   . AAA (abdominal aortic aneurysm) without rupture 09/25/2013    Past Surgical History  Procedure Laterality Date  . Cholecystectomy    . Cataract surg    . Bladder surgery      tack  . Kidney stone surgery    . Coronary stent placement  2004    2  . Coronary stent placement  2008    2  . S/p hysterectomy    . Neck surgery    . Esophagogastroduodenoscopy  5/08    normal esophagus, No H.Pylori  . Egd/bravo  07/2008    On Nexium BID. Day 1 DMSTR Score: 0.7, day 2: DMSTR score: 32, uncontrolled GERD, No H. Pylori  . Hbt  2009    negative  . Colonoscopy  05/2010    tortuous sigm colon, sigm tics, multiple simple adenomas, hemorrhoids, next TCS 10-15 yrs per Dr. Oneida Alar. Previous h/o tubular adenomas in  2008.   . Esophagogastroduodenoscopy  05/2010    small hh, streaky erythema in body/antrum. Duodenal bx negative.   . Esophagogastroduodenoscopy  06/22/2011    mild gastritis/ring in the distal esophagus, savary dilation. Bx reactive gastropathy, No H.pylori  . Savory dilation  06/22/2011    Procedure: SAVORY DILATION;  Surgeon: Dorothyann Peng, MD;  [facility];  Service: Endoscopy;  Laterality: N/A;  [name] dilation  06/22/2011    Procedure: [name] DILATION;  Surgeon: Dorothyann Peng, MD;  [facility]: [facility];  Service: Endoscopy;  Laterality: N/A;  . Video brochoscopy with electromagnetic navigaton  05/10/2011    [name]  . Right lower lobe superior segmentectomy  October 2012  . Hysterectomy at age 50    . Cardiac  catheterization    . Abdominal hysterectomy    . Esophagogastroduodenoscopy N/A 08/14/2013    Procedure: ESOPHAGOGASTRODUODENOSCOPY (EGD);  Surgeon: Danie Binder, MD;  [facility];  Service: Endoscopy;  Laterality: N/A;  2:45  . Colonoscopy N/A 11/02/2013    Procedure: COLONOSCOPY;  Surgeon: Danie Binder, MD;  [facility];  Service: Endoscopy;  Laterality: N/A;  1:15  . Hemorrhoid banding N/A 11/02/2013    Procedure: HEMORRHOID BANDING;  Surgeon: Danie Binder, MD;  [facility];  Service: Endoscopy;  Laterality: N/A;  . Agile capsule N/A 11/12/2013    Procedure: AGILE CAPSULE;  Surgeon: Danie Binder, MD;  [facility];  Service: Endoscopy;  Laterality: N/A;  8:00  . Bacterial overgrowth test N/A 11/16/2013    Procedure: BACTERIAL OVERGROWTH TEST;  Surgeon: Danie Binder, MD;  [facility];  Service: Endoscopy;  Laterality: N/A;  8:00    Current Outpatient Prescriptions  Medication Sig Dispense Refill  . albuterol (PROAIR HFA) 108 (90 BASE) MCG/ACT inhaler Inhale 2 puffs into the lungs every 6 (six) hours as needed. For wheezing      . ALPRAZolam (XANAX) 1 MG tablet Take 0.5-2 mg by mouth at bedtime as needed for anxiety.      Marland Kitchen amLODipine (NORVASC) 5 MG tablet TAKE 1 TABLET ONCE A DAY  30 tablet  6  . atorvastatin (LIPITOR) 20 MG tablet Take 20 mg by mouth at bedtime.       . clopidogrel (PLAVIX) 75 MG tablet TAKE 1 TABLET DAILY  30 tablet  6  . cyanocobalamin (,VITAMIN B-12,) 1000 MCG/ML injection Inject 1,000 mcg into the muscle every 30 (thirty) days.      Marland Kitchen docusate sodium (COLACE) 100 MG capsule Take 100 mg by mouth daily as needed for mild constipation.      . DULoxetine (CYMBALTA) 60 MG capsule Take 60 mg by mouth daily.      Marland Kitchen gabapentin (NEURONTIN) 300 MG capsule Take 300-600 mg by mouth 2 (two) times daily. Take 300mg  in the morning & 600mg  at bedtime      . HYDROcodone-acetaminophen (NORCO) 10-325 MG per tablet Take 1  tablet by mouth every 8 (eight) hours as needed for moderate pain.      Marland Kitchen HYDROcodone-acetaminophen (NORCO) 7.5-325 MG per tablet Take 1 tablet by mouth every 6 (six) hours as needed. pain      . JANUVIA 50 MG tablet Take 50 mg by mouth daily.       Marland Kitchen levothyroxine (SYNTHROID, LEVOTHROID) 125 MCG tablet Take 125 mcg by mouth daily before breakfast.      . lidocaine-prilocaine (EMLA) cream Apply 1 application topically daily.      Marland Kitchen  mupirocin ointment (BACTROBAN) 2 % Place 1 application into the nose daily.      . nitroGLYCERIN (NITROSTAT) 0.4 MG SL tablet Place 0.4 mg under the tongue every 5 (five) minutes as needed for chest pain.      . pantoprazole (PROTONIX) 40 MG tablet Take 40 mg by mouth 2 (two) times daily.       . pantoprazole (PROTONIX) 40 MG tablet TAKE (1) TABLET TWICE A DAY.  60 tablet  5  . polyethylene glycol (MIRALAX / GLYCOLAX) packet Take 17 g by mouth daily.  14 each  0  . ranitidine (ZANTAC) 150 MG tablet Take 150 mg by mouth at bedtime.       No current facility-administered medications for this visit.    Allergies  Allergen Reactions  . Linzess [Linaclotide] Other (See Comments)    EXPLOSIVE DIARRHEA  . Oxycodone Other (See Comments)    Causes hyperactivity   . Sulfonamide Derivatives Swelling and Rash    Hands & feet swell    History   Social History  . Marital Status: Widowed    Spouse Name: N/A    Number of Children: N/A  . Years of Education: N/A   Occupational History  . Not on file.   Social History Main Topics  . Smoking status: Current Every Day Smoker -- 0.50 packs/day    Types: Cigarettes  . Smokeless tobacco: Never Used     Comment: She smokes 3 cigarettes daily  . Alcohol Use: No  . Drug Use: No  . Sexual Activity: No   Other Topics Concern  . Not on file   Social History Narrative  . No narrative on file    Family History  Problem Relation Age of Onset  . Coronary artery disease      family hx of  . Colon cancer Sister 57  .  Colon cancer Daughter   . Prostate cancer      family hx of  . Lung cancer      family hx of  . Ovarian cancer      family hx of  . Arthritis      family hx of  . Other      family hx of chronic respiratory condition  . Heart disease Mother   . Stroke Mother     Review of Systems:  As stated in the HPI and otherwise negative.   BP 118/62  Pulse 62  Ht 5\' 9"  (1.753 m)  Wt 196 lb (88.905 kg)  BMI 28.93 kg/m2  Physical Examination: General: Well developed, well nourished, NAD HEENT: OP clear, mucus membranes moist SKIN: warm, dry. No rashes. Neuro: No focal deficits Musculoskeletal: Muscle strength 5/5 all ext Psychiatric: Mood and affect normal Neck: No JVD, no carotid bruits, no thyromegaly, no lymphadenopathy. Lungs:Clear bilaterally, no wheezes, rhonci, crackles Cardiovascular: Regular rate and rhythm. No murmurs, gallops or rubs. Abdomen:Soft. Bowel sounds present. Non-tender.  Extremities: No lower extremity edema. Pulses are 2 + in the bilateral DP/PT.  Lexiscan Myoview 12/05/12: Stress Procedure: The patient received IV Lexiscan 0.4 mg over 15-seconds. Technetium 61m Sestamibi injected at 30-seconds. She did c/o chest tightness 9/10 with Lexiscan and was give Aminophylline 75 mg with some relief, 5/10. She was given a cup of coffee after Lexiscan and chest tightness was relieved completely prior to stress images. Quantitative spect images were obtained after a 45 minute delay.  Stress ECG: Uninteretable due to baseline LBBB  QPS  Raw Data Images: Acquisition technically good;  normal left ventricular size.  Stress Images: There is decreased uptake in the septum and apex.  Rest Images: There is decreased uptake in the septum and apex.  Subtraction (SDS): No evidence of ischemia.  Transient Ischemic Dilatation (Normal <1.22): 0.89  Lung/Heart Ratio (Normal <0.45): 0.35  Quantitative Gated Spect Images  QGS EDV: 106 ml  QGS ESV: 45 ml  Impression  Exercise Capacity:  Lexiscan with no exercise.  BP Response: Normal blood pressure response.  Clinical Symptoms: There is chest tightness.  ECG Impression: Baseline: LBBB. EKG uninterpretable due to LBBB at rest and stress.  Comparison with Prior Nuclear Study: No images to compare  Overall Impression: Low risk stress nuclear study with a large, moderate intensity, fixed septal and apical defect consistent with infarct vs LBBB; no significant ischemia.  LV Ejection Fraction: 58%. LV Wall Motion: NL LV Function; NL Wall Motion  EKG: Sinus, rate 58bpm. LBBB, chronic.   Assessment and Plan:   1. CAD: Low risk stress myoview January 2014.  Most recent cath was in August 2012 and her disease was stable. She now needs to have general anesthesia for removal of neck lesion/malignancy. Given her history of CAD, she will need stress myoview for cardiac risk stratification. Will continue Plavix and statin after her procedure. Will need to hold Plavix 5 days before procedure.   2. Tobacco abuse: She is cutting back. Smoking cessation encouraged. She wishes to stop.   3. AAA: Stable. Recent visit with Dr. Donnetta Hutching in [facility] November 2014.   4. Carotid artery disease: Stable by dopplers May 2014.   5. HTN: BP controlled.   6. HLD: on statin  7. Pre-operative cardiovascular examination: See above. Plan Lexiscan stress myoview for risk stratification.

## 2014-03-29 NOTE — Patient Instructions (Signed)
Your physician wants you to follow-up in: 6 months.   You will receive a reminder letter in the mail two months in advance. If you don't receive a letter, please call our office to schedule the follow-up appointment.  Your physician has requested that you have a lexiscan myoview. For further information please visit [email or web address]. Please follow instruction sheet, as given.   

## 2014-04-02 ENCOUNTER — Ambulatory Visit (HOSPITAL_COMMUNITY)
Admission: RE | Admit: 2014-04-02 | Discharge: 2014-04-02 | Disposition: A | Discharge status: Home / Self Care | Payer: Medicare Other | Source: Ambulatory Visit | Attending: Internal Medicine | Admitting: Internal Medicine

## 2014-04-02 DIAGNOSIS — Z01810 Encounter for preprocedural cardiovascular examination: Secondary | ICD-10-CM

## 2014-04-02 DIAGNOSIS — I2510 Atherosclerotic heart disease of native coronary artery without angina pectoris: Secondary | ICD-10-CM | POA: Insufficient documentation

## 2014-04-02 MED ORDER — REGADENOSON 0.4 MG/5ML IV SOLN
0.4000 mg | Freq: Once | INTRAVENOUS | Status: AC
  Administered 2014-04-02: 0.4 mg via INTRAVENOUS

## 2014-04-02 MED ORDER — TECHNETIUM TC 99M SESTAMIBI GENERIC - CARDIOLITE
30.9000 | Freq: Once | INTRAVENOUS | Status: AC | PRN
  Administered 2014-04-02: 30.9 via INTRAVENOUS

## 2014-04-02 MED ORDER — AMINOPHYLLINE 25 MG/ML IV SOLN
150.0000 mg | Freq: Once | INTRAVENOUS | Status: AC
  Administered 2014-04-02: 150 mg via INTRAVENOUS

## 2014-04-02 MED ORDER — TECHNETIUM TC 99M SESTAMIBI GENERIC - CARDIOLITE
10.5000 | Freq: Once | INTRAVENOUS | Status: AC | PRN
  Administered 2014-04-02: 11 via INTRAVENOUS

## 2014-04-02 NOTE — Procedures (Addendum)
[place] [address] [place] [address] [phone]  Cardiology Nuclear Med Study  Madison Henson is a 80 y.o. female     MRN : [id number]     DOB: 1928-08-21  Procedure Date: 04/02/2014  Nuclear Med Background Indication for Stress Test:  Surgical Clearance and Stent Patency History:  MI 2008, Stent placement 2004, 2008, 1/14 Lexiscan MPI Scar EF=58% Cardiac Risk Factors: Hypertension, LBBB, Lipids, Overweight, PVD, Smoker and Diabetic  Symptoms:  DOE and SOB   Nuclear Pre-Procedure Caffeine/Decaff Intake:  9:00pm NPO After: 7:00am   IV Site: R Antecubital  IV 0.9% NS with Angio Cath:  22g  Chest Size (in):  n/a IV Started by: Otho Perl, CNMT  Height: 5\' 9"  (1.753 m)  Cup Size: 38C  BMI:  Body mass index is 28.93 kg/(m^2). Weight:  196 lb (88.905 kg)   Tech Comments:  n/a    Nuclear Med Study 1 or 2 day study: 1 day  Stress Test Type:  [place] Provider:  Darlina Guys, MD   Resting Radionuclide: Technetium 91m Sestamibi  Resting Radionuclide Dose: 10.5 mCi   Stress Radionuclide:  Technetium 53m Sestamibi  Stress Radionuclide Dose: 30.9 mCi           Stress Protocol Rest HR:55 Stress HR:65  Rest BP: 133/62 Stress BP: 133/70  Exercise Time (min): n/a METS: n/a          Dose of Adenosine (mg):  n/a Dose of Lexiscan: 0.4 mg  Dose of Atropine (mg): n/a Dose of Dobutamine: n/a mcg/kg/min (at max HR)  Stress Test Technologist: Mellody Memos, CCT Nuclear Technologist: Imagene Riches, CNMT   Rest Procedure:  Myocardial perfusion imaging was performed at rest 45 minutes following the intravenous administration of Technetium 16m Sestamibi. Stress Procedure:  The patient received IV Lexiscan 0.4 mg over 15-seconds.  Technetium 59m Sestamibi injected IV at 30-seconds.  Patient experienced shortness of breath and was administered 150 mg of Aminophylline IV at 5 minutes. There were no  significant changes with Lexiscan.  Quantitative spect images were obtained after a 45 minute delay.  Transient Ischemic Dilatation (Normal <1.22):  1.09 Lung/Heart Ratio (Normal <0.45):  0.32 QGS EDV:  96 ml QGS ESV:  45 ml LV Ejection Fraction: 53%  Rest ECG: NSR-LBBB (rate-dependent)  Stress ECG: No significant change from baseline ECG  QPS Raw Data Images:  Normal; no motion artifact; normal heart/lung ratio. Stress Images:  There is decreased uptake in the anterior wall. Rest Images:  There is decreased uptake in the anterior wall. Subtraction (SDS):  Mostly fixed anteroseptal and apical defect  Impression Exercise Capacity:  Lexiscan with no exercise. BP Response:  Normal blood pressure response. Clinical Symptoms:  No significant symptoms noted. ECG Impression:  No significant ST segment change suggestive of ischemia. Comparison with Prior Nuclear Study: No previous nuclear study performed  Overall Impression:  Intermediate risk stress nuclear study with a mostly fixed anteroseptal and apical fixed defect, consistent with possible scar or rate-related LBBB artifact.  LV Wall Motion:  NL LV Function; NL Wall Motion; LVEF 53%.  Pixie Casino, MD, [facility] Board Certified in Nuclear Cardiology Attending Cardiologist [place]  Pixie Casino, MD  04/02/2014 3:48 PM

## 2014-04-03 ENCOUNTER — Encounter: Payer: Self-pay | Admitting: Cardiovascular Disease

## 2014-04-05 ENCOUNTER — Other Ambulatory Visit: Payer: Self-pay | Admitting: Otolaryngology

## 2014-04-10 ENCOUNTER — Other Ambulatory Visit: Payer: Self-pay | Admitting: Otolaryngology

## 2014-04-11 ENCOUNTER — Ambulatory Visit: Payer: Medicare Other | Admitting: Cardiovascular Disease

## 2014-04-12 ENCOUNTER — Other Ambulatory Visit (HOSPITAL_COMMUNITY): Payer: Medicare Other

## 2014-04-18 NOTE — Pre-Procedure Instructions (Addendum)
Madison Henson  04/18/2014   Your procedure is scheduled on:  Friday June 12 th at 0929 AM  Report to [facility] Admitting at [phone] AM.  Call this number if you have problems the morning of surgery: [phone]   Remember:   Do not eat food or drink liquids after midnight Thursday.     Take these medicines the morning of surgery with A SIP OF WATER: Xanax, Amlodipine, Cymbalta,Pain med if needed, Synthroid, and Protonix. Use and bring inhaler. Stop Plavix before surgery 04/19/14 per Dr Wilburn Cornelia and Dr Angelena Form. Stop Aspirin, Nsaids, and Herbal medications 5 days  prior to surgery.   Do not wear jewelry, make-up or nail polish.  Do not wear lotions, powders, or perfumes. You may wear deodorant.  Do not shave 48 hours prior to surgery.   Do not bring valuables to the hospital.  [facility] is not responsible for any belongings or valuables.               Contacts, dentures or bridgework may not be worn into surgery.  Leave suitcase in the car. After surgery it may be brought to your room.  For patients admitted to the hospital, discharge time is determined by your  treatment team.               Patients discharged the day of surgery will not be allowed to drive home.    Special Instructions: [name] - Preparing for Surgery  Before surgery, you can play an important role.  Because skin is not sterile, your skin needs to be as free of germs as possible.  You can reduce the number of germs on you skin by washing with CHG (chlorahexidine gluconate) soap before surgery.  CHG is an antiseptic cleaner which kills germs and bonds with the skin to continue killing germs even after washing.  Please DO NOT use if you have an allergy to CHG or antibacterial soaps.  If your skin becomes reddened/irritated stop using the CHG and inform your nurse when you arrive at Short Stay.  Do not shave (including legs and underarms) for at least 48 hours prior to the first CHG shower.  You may  shave your face.  Please follow these instructions carefully:   1.  Shower with CHG Soap the night before surgery and the                                morning of Surgery.  2.  If you choose to wash your hair, wash your hair first as usual with your       normal shampoo.  3.  After you shampoo, rinse your hair and body thoroughly to remove the                      Shampoo.  4.  Use CHG as you would any other liquid soap.  You can apply chg directly       to the skin and wash gently with scrungie or a clean washcloth.  5.  Apply the CHG Soap to your body ONLY FROM THE NECK DOWN.        Do not use on open wounds or open sores.  Avoid contact with your eyes,       ears, mouth and genitals (private parts).  Wash genitals (private parts)       with your normal soap.  6.  Wash thoroughly, paying special attention to the area where your surgery        will be performed.  7.  Thoroughly rinse your body with warm water from the neck down.  8.  DO NOT shower/wash with your normal soap after using and rinsing off       the CHG Soap.  9.  Pat yourself dry with a clean towel.            10.  Wear clean pajamas.            11.  Place clean sheets on your bed the night of your first shower and do not        sleep with pets.  Day of Surgery  Do not apply any lotions/deoderants the morning of surgery.  Please wear clean clothes to the hospital/surgery center.      Please read over the following fact sheets that you were given: Pain Booklet, Coughing and Deep Breathing and Surgical Site Infection Prevention

## 2014-04-19 ENCOUNTER — Encounter (HOSPITAL_COMMUNITY): Payer: Self-pay

## 2014-04-19 ENCOUNTER — Ambulatory Visit (HOSPITAL_COMMUNITY)
Admission: RE | Admit: 2014-04-19 | Discharge: 2014-04-19 | Disposition: A | Discharge status: Home / Self Care | Payer: Medicare Other | Source: Ambulatory Visit | Attending: Anesthesiology | Admitting: Anesthesiology

## 2014-04-19 ENCOUNTER — Encounter (HOSPITAL_COMMUNITY)
Admission: RE | Admit: 2014-04-19 | Discharge: 2014-04-19 | Disposition: A | Discharge status: Home / Self Care | Payer: Medicare Other | Source: Ambulatory Visit | Attending: Otolaryngology | Admitting: Otolaryngology

## 2014-04-19 DIAGNOSIS — Z9861 Coronary angioplasty status: Secondary | ICD-10-CM | POA: Insufficient documentation

## 2014-04-19 DIAGNOSIS — F411 Generalized anxiety disorder: Secondary | ICD-10-CM | POA: Insufficient documentation

## 2014-04-19 DIAGNOSIS — F3289 Other specified depressive episodes: Secondary | ICD-10-CM | POA: Insufficient documentation

## 2014-04-19 DIAGNOSIS — Z902 Acquired absence of lung [part of]: Secondary | ICD-10-CM | POA: Insufficient documentation

## 2014-04-19 DIAGNOSIS — Z01818 Encounter for other preprocedural examination: Secondary | ICD-10-CM | POA: Insufficient documentation

## 2014-04-19 DIAGNOSIS — F329 Major depressive disorder, single episode, unspecified: Secondary | ICD-10-CM | POA: Insufficient documentation

## 2014-04-19 DIAGNOSIS — I447 Left bundle-branch block, unspecified: Secondary | ICD-10-CM | POA: Insufficient documentation

## 2014-04-19 DIAGNOSIS — I10 Essential (primary) hypertension: Secondary | ICD-10-CM | POA: Insufficient documentation

## 2014-04-19 DIAGNOSIS — I714 Abdominal aortic aneurysm, without rupture, unspecified: Secondary | ICD-10-CM | POA: Insufficient documentation

## 2014-04-19 DIAGNOSIS — J4489 Other specified chronic obstructive pulmonary disease: Secondary | ICD-10-CM | POA: Insufficient documentation

## 2014-04-19 DIAGNOSIS — I2510 Atherosclerotic heart disease of native coronary artery without angina pectoris: Secondary | ICD-10-CM | POA: Insufficient documentation

## 2014-04-19 DIAGNOSIS — J449 Chronic obstructive pulmonary disease, unspecified: Secondary | ICD-10-CM | POA: Insufficient documentation

## 2014-04-19 DIAGNOSIS — Z85118 Personal history of other malignant neoplasm of bronchus and lung: Secondary | ICD-10-CM | POA: Insufficient documentation

## 2014-04-19 DIAGNOSIS — K219 Gastro-esophageal reflux disease without esophagitis: Secondary | ICD-10-CM | POA: Insufficient documentation

## 2014-04-19 DIAGNOSIS — I509 Heart failure, unspecified: Secondary | ICD-10-CM | POA: Insufficient documentation

## 2014-04-19 DIAGNOSIS — Z01812 Encounter for preprocedural laboratory examination: Secondary | ICD-10-CM | POA: Insufficient documentation

## 2014-04-19 DIAGNOSIS — E119 Type 2 diabetes mellitus without complications: Secondary | ICD-10-CM | POA: Insufficient documentation

## 2014-04-19 LAB — CBC
HCT: 45.8 % (ref 36.0–46.0)
HEMOGLOBIN: 15 g/dL (ref 12.0–15.0)
MCH: 30.9 pg (ref 26.0–34.0)
MCHC: 32.8 g/dL (ref 30.0–36.0)
MCV: 94.2 fL (ref 78.0–100.0)
Platelets: 237 10*3/uL (ref 150–400)
RBC: 4.86 MIL/uL (ref 3.87–5.11)
RDW: 12.8 % (ref 11.5–15.5)
WBC: 8.5 10*3/uL (ref 4.0–10.5)

## 2014-04-19 LAB — BASIC METABOLIC PANEL
BUN: 12 mg/dL (ref 6–23)
CALCIUM: 9.6 mg/dL (ref 8.4–10.5)
CO2: 26 mEq/L (ref 19–32)
Chloride: 104 mEq/L (ref 96–112)
Creatinine, Ser: 1 mg/dL (ref 0.50–1.10)
GFR calc Af Amer: 59 mL/min — ABNORMAL LOW (ref >90)
GFR calc non Af Amer: 51 mL/min — ABNORMAL LOW (ref >90)
GLUCOSE: 96 mg/dL (ref 70–99)
POTASSIUM: 4 meq/L (ref 3.7–5.3)
SODIUM: 144 meq/L (ref 137–147)

## 2014-04-22 NOTE — Progress Notes (Signed)
Anesthesia Chart Review:  Patient is a 80 year old female scheduled for local excision of neck skin lesion and immediate subcutaneous tissue with local reconstruction on 04/26/14 by Dr. Wilburn Cornelia.  History includes CAD s/p CX stents X 2 '03 and RCA '05 with diaphragmatic wall infarction '08 due to RCA stent thrombosis s/p new DES to RCA , chronic left BBB, CHF, AAA (4.3 cm by CTA 01/17/14), stage 1A [place] lung CA s/p right lower lobectomy '12, COPD, HTN, HLD, DM2, GERD, anxiety, depression, anemia, esophageal dilation for stricture. Cardiologist Dr. Buena Irish singed a note of surgical clearance with permission to hold Plavix 5 days preopratively.  PCP is listed as D.r Delphina Cahill.  EKG on 03/29/14 showed: SB at 58 bpm, left BBB.  Nuclear stress test on 04/02/14 showed: Overall Impression: Intermediate risk stress nuclear study with a mostly fixed anteroseptal and apical fixed defect, consistent with possible scar or rate-related LBBB artifact. LV Wall Motion: NL LV Function; NL Wall Motion; LVEF 53%. Results reviewed by Dr. Angelena Form who felt no ischemia noted.  Cardiac cath on 07/08/11 showed: 10% LM, 40% ostium LAD with mild plaque in the mid and distal LAD and DIAG branch. Patent mid CX stent with 40% stenosis just beyond the stented segment. Patent mid RCA stent. EF 50%. Medical therapy recommended.  Echo on 03/04/11 showed: - Left ventricle: The cavity size was normal. Wall thickness was increased in a pattern of mild LVH. Systolic function was normal. The estimated ejection fraction was in the range of 55% to 60%. Wall motion was normal; there were no regional wall motion abnormalities. Doppler parameters are consistent with abnormal left ventricular relaxation (grade 1 diastolic dysfunction). - Ventricular septum: Septal motion showed abnormal function. These changes are consistent with a left bundle branch block. - Left atrium: The atrium was mildly dilated. - Trivial mitral and tricuspid  regurgitation.  Carotid duplex on 03/20/13 showed: 40-59& RICA stenosis, 7-20% LICA stenosis, patent vertebral arteries with antegrade flow.  CXR on 04/19/14 showed: Stable postop changes with volume loss and scarring in the right hemi thorax. No superimposed acute process.  Preoperative labs noted.   She has been cleared by her cardiologist.  If no acute changes then I would anticipate that she can proceed as planned.  George Hugh [facility] Short Stay Center/Anesthesiology Phone [phone] 04/22/2014 1:11 PM

## 2014-04-25 MED ORDER — CEFAZOLIN SODIUM-DEXTROSE 2-3 GM-% IV SOLR
2.0000 g | INTRAVENOUS | Status: DC
  Filled 2014-04-25: qty 50

## 2014-04-25 MED ORDER — DEXAMETHASONE SODIUM PHOSPHATE 10 MG/ML IJ SOLN: 10.0000 mg | Freq: Once | INTRAMUSCULAR | Status: DC

## 2014-04-26 ENCOUNTER — Observation Stay (HOSPITAL_COMMUNITY): Payer: Medicare Other

## 2014-04-26 ENCOUNTER — Inpatient Hospital Stay (HOSPITAL_COMMUNITY)
Admission: RE | Admit: 2014-04-26 | Discharge: 2014-05-02 | DRG: 989 | Disposition: A | Discharge status: Home / Self Care | Payer: Medicare Other | Source: Ambulatory Visit | Attending: Internal Medicine | Admitting: Internal Medicine

## 2014-04-26 ENCOUNTER — Encounter (HOSPITAL_COMMUNITY): Payer: Self-pay | Admitting: Certified Registered Nurse Anesthetist

## 2014-04-26 ENCOUNTER — Encounter (HOSPITAL_COMMUNITY)
Admission: RE | Disposition: A | Discharge status: Home / Self Care | Payer: Self-pay | Source: Ambulatory Visit | Attending: Internal Medicine

## 2014-04-26 ENCOUNTER — Ambulatory Visit (HOSPITAL_COMMUNITY): Payer: Medicare Other | Admitting: Certified Registered Nurse Anesthetist

## 2014-04-26 ENCOUNTER — Encounter (HOSPITAL_COMMUNITY): Payer: Medicare Other | Admitting: Vascular Surgery

## 2014-04-26 DIAGNOSIS — Z8249 Family history of ischemic heart disease and other diseases of the circulatory system: Secondary | ICD-10-CM

## 2014-04-26 DIAGNOSIS — K921 Melena: Secondary | ICD-10-CM

## 2014-04-26 DIAGNOSIS — K219 Gastro-esophageal reflux disease without esophagitis: Secondary | ICD-10-CM | POA: Diagnosis present

## 2014-04-26 DIAGNOSIS — Z8041 Family history of malignant neoplasm of ovary: Secondary | ICD-10-CM

## 2014-04-26 DIAGNOSIS — K3184 Gastroparesis: Secondary | ICD-10-CM

## 2014-04-26 DIAGNOSIS — R1319 Other dysphagia: Secondary | ICD-10-CM

## 2014-04-26 DIAGNOSIS — K648 Other hemorrhoids: Secondary | ICD-10-CM

## 2014-04-26 DIAGNOSIS — R079 Chest pain, unspecified: Secondary | ICD-10-CM

## 2014-04-26 DIAGNOSIS — Z800 Family history of malignant neoplasm of digestive organs: Secondary | ICD-10-CM

## 2014-04-26 DIAGNOSIS — I714 Abdominal aortic aneurysm, without rupture, unspecified: Secondary | ICD-10-CM

## 2014-04-26 DIAGNOSIS — F329 Major depressive disorder, single episode, unspecified: Secondary | ICD-10-CM | POA: Diagnosis present

## 2014-04-26 DIAGNOSIS — F0390 Unspecified dementia without behavioral disturbance: Secondary | ICD-10-CM | POA: Diagnosis not present

## 2014-04-26 DIAGNOSIS — Z7902 Long term (current) use of antithrombotics/antiplatelets: Secondary | ICD-10-CM

## 2014-04-26 DIAGNOSIS — J4489 Other specified chronic obstructive pulmonary disease: Secondary | ICD-10-CM

## 2014-04-26 DIAGNOSIS — E039 Hypothyroidism, unspecified: Secondary | ICD-10-CM | POA: Diagnosis present

## 2014-04-26 DIAGNOSIS — K5900 Constipation, unspecified: Secondary | ICD-10-CM

## 2014-04-26 DIAGNOSIS — F17200 Nicotine dependence, unspecified, uncomplicated: Secondary | ICD-10-CM

## 2014-04-26 DIAGNOSIS — R109 Unspecified abdominal pain: Secondary | ICD-10-CM

## 2014-04-26 DIAGNOSIS — Z8719 Personal history of other diseases of the digestive system: Secondary | ICD-10-CM

## 2014-04-26 DIAGNOSIS — I2510 Atherosclerotic heart disease of native coronary artery without angina pectoris: Secondary | ICD-10-CM

## 2014-04-26 DIAGNOSIS — J189 Pneumonia, unspecified organism: Secondary | ICD-10-CM

## 2014-04-26 DIAGNOSIS — M11869 Other specified crystal arthropathies, unspecified knee: Secondary | ICD-10-CM

## 2014-04-26 DIAGNOSIS — D649 Anemia, unspecified: Secondary | ICD-10-CM

## 2014-04-26 DIAGNOSIS — Z9861 Coronary angioplasty status: Secondary | ICD-10-CM

## 2014-04-26 DIAGNOSIS — R0789 Other chest pain: Secondary | ICD-10-CM

## 2014-04-26 DIAGNOSIS — J441 Chronic obstructive pulmonary disease with (acute) exacerbation: Principal | ICD-10-CM

## 2014-04-26 DIAGNOSIS — E876 Hypokalemia: Secondary | ICD-10-CM

## 2014-04-26 DIAGNOSIS — R1013 Epigastric pain: Secondary | ICD-10-CM

## 2014-04-26 DIAGNOSIS — I509 Heart failure, unspecified: Secondary | ICD-10-CM | POA: Diagnosis not present

## 2014-04-26 DIAGNOSIS — L989 Disorder of the skin and subcutaneous tissue, unspecified: Secondary | ICD-10-CM

## 2014-04-26 DIAGNOSIS — J9600 Acute respiratory failure, unspecified whether with hypoxia or hypercapnia: Secondary | ICD-10-CM

## 2014-04-26 DIAGNOSIS — I252 Old myocardial infarction: Secondary | ICD-10-CM

## 2014-04-26 DIAGNOSIS — J449 Chronic obstructive pulmonary disease, unspecified: Secondary | ICD-10-CM | POA: Diagnosis present

## 2014-04-26 DIAGNOSIS — R911 Solitary pulmonary nodule: Secondary | ICD-10-CM

## 2014-04-26 DIAGNOSIS — E785 Hyperlipidemia, unspecified: Secondary | ICD-10-CM

## 2014-04-26 DIAGNOSIS — R197 Diarrhea, unspecified: Secondary | ICD-10-CM

## 2014-04-26 DIAGNOSIS — I10 Essential (primary) hypertension: Secondary | ICD-10-CM

## 2014-04-26 DIAGNOSIS — M48061 Spinal stenosis, lumbar region without neurogenic claudication: Secondary | ICD-10-CM

## 2014-04-26 DIAGNOSIS — Z720 Tobacco use: Secondary | ICD-10-CM

## 2014-04-26 DIAGNOSIS — J9601 Acute respiratory failure with hypoxia: Secondary | ICD-10-CM

## 2014-04-26 DIAGNOSIS — E1149 Type 2 diabetes mellitus with other diabetic neurological complication: Secondary | ICD-10-CM | POA: Diagnosis present

## 2014-04-26 DIAGNOSIS — Z85118 Personal history of other malignant neoplasm of bronchus and lung: Secondary | ICD-10-CM

## 2014-04-26 DIAGNOSIS — F3289 Other specified depressive episodes: Secondary | ICD-10-CM | POA: Diagnosis present

## 2014-04-26 DIAGNOSIS — Z7901 Long term (current) use of anticoagulants: Secondary | ICD-10-CM

## 2014-04-26 DIAGNOSIS — M5430 Sciatica, unspecified side: Secondary | ICD-10-CM

## 2014-04-26 DIAGNOSIS — Z801 Family history of malignant neoplasm of trachea, bronchus and lung: Secondary | ICD-10-CM

## 2014-04-26 DIAGNOSIS — R1310 Dysphagia, unspecified: Secondary | ICD-10-CM

## 2014-04-26 DIAGNOSIS — Z823 Family history of stroke: Secondary | ICD-10-CM

## 2014-04-26 HISTORY — PX: OTHER SURGICAL HISTORY: SHX169

## 2014-04-26 HISTORY — PX: MASS EXCISION: SHX2000

## 2014-04-26 LAB — CBC
HCT: 38.9 % (ref 36.0–46.0)
HEMOGLOBIN: 12.8 g/dL (ref 12.0–15.0)
MCH: 30.5 pg (ref 26.0–34.0)
MCHC: 32.9 g/dL (ref 30.0–36.0)
MCV: 92.8 fL (ref 78.0–100.0)
Platelets: 208 10*3/uL (ref 150–400)
RBC: 4.19 MIL/uL (ref 3.87–5.11)
RDW: 12.8 % (ref 11.5–15.5)
WBC: 8.9 10*3/uL (ref 4.0–10.5)

## 2014-04-26 LAB — BASIC METABOLIC PANEL
BUN: 10 mg/dL (ref 6–23)
CHLORIDE: 105 meq/L (ref 96–112)
CO2: 25 mEq/L (ref 19–32)
CREATININE: 0.86 mg/dL (ref 0.50–1.10)
Calcium: 8.9 mg/dL (ref 8.4–10.5)
GFR calc Af Amer: 70 mL/min — ABNORMAL LOW (ref >90)
GFR, EST NON AFRICAN AMERICAN: 61 mL/min — AB (ref >90)
Glucose, Bld: 146 mg/dL — ABNORMAL HIGH (ref 70–99)
Potassium: 4 mEq/L (ref 3.7–5.3)
Sodium: 144 mEq/L (ref 137–147)

## 2014-04-26 LAB — D-DIMER, QUANTITATIVE (NOT AT ARMC): D DIMER QUANT: 0.68 ug{FEU}/mL — AB (ref 0.00–0.48)

## 2014-04-26 LAB — GLUCOSE, CAPILLARY
GLUCOSE-CAPILLARY: 99 mg/dL (ref 70–99)
GLUCOSE-CAPILLARY: 196 mg/dL — AB (ref 70–99)
Glucose-Capillary: 91 mg/dL (ref 70–99)
Glucose-Capillary: 137 mg/dL — ABNORMAL HIGH (ref 70–99)

## 2014-04-26 LAB — TROPONIN I: Troponin I: <0.3 ng/mL (ref <0.30)

## 2014-04-26 LAB — HEMOGLOBIN A1C
Hgb A1c MFr Bld: 6.1 % — ABNORMAL HIGH (ref <5.7)
Mean Plasma Glucose: 128 mg/dL — ABNORMAL HIGH (ref <117)

## 2014-04-26 SURGERY — EXCISION MASS
Anesthesia: General | Site: Neck

## 2014-04-26 MED ORDER — NITROGLYCERIN 0.4 MG SL SUBL
SUBLINGUAL_TABLET | SUBLINGUAL | Status: AC
  Filled 2014-04-26: qty 1

## 2014-04-26 MED ORDER — FENTANYL CITRATE 0.05 MG/ML IJ SOLN
INTRAMUSCULAR | Status: AC
  Filled 2014-04-26: qty 5

## 2014-04-26 MED ORDER — 0.9 % SODIUM CHLORIDE (POUR BTL) OPTIME
TOPICAL | Status: DC | PRN
  Administered 2014-04-26: 1000 mL

## 2014-04-26 MED ORDER — NEOSTIGMINE METHYLSULFATE 10 MG/10ML IV SOLN
INTRAVENOUS | Status: DC | PRN
  Administered 2014-04-26: 3 mg via INTRAVENOUS

## 2014-04-26 MED ORDER — ONDANSETRON HCL 4 MG/2ML IJ SOLN
INTRAMUSCULAR | Status: DC | PRN
  Administered 2014-04-26: 4 mg via INTRAVENOUS

## 2014-04-26 MED ORDER — ACETAMINOPHEN 160 MG/5ML PO SOLN
325.0000 mg | ORAL | Status: DC | PRN
  Filled 2014-04-26: qty 20.3

## 2014-04-26 MED ORDER — IPRATROPIUM-ALBUTEROL 0.5-2.5 (3) MG/3ML IN SOLN
3.0000 mL | Freq: Four times a day (QID) | RESPIRATORY_TRACT | Status: DC
  Administered 2014-04-26 – 2014-04-27 (×3): 3 mL via RESPIRATORY_TRACT
  Filled 2014-04-26 (×3): qty 3

## 2014-04-26 MED ORDER — GLYCOPYRROLATE 0.2 MG/ML IJ SOLN
INTRAMUSCULAR | Status: AC
  Filled 2014-04-26: qty 2

## 2014-04-26 MED ORDER — ONDANSETRON HCL 4 MG/2ML IJ SOLN
4.0000 mg | Freq: Four times a day (QID) | INTRAMUSCULAR | Status: DC | PRN
  Administered 2014-04-29 – 2014-04-30 (×2): 4 mg via INTRAVENOUS
  Filled 2014-04-26: qty 2

## 2014-04-26 MED ORDER — FENTANYL CITRATE 0.05 MG/ML IJ SOLN
INTRAMUSCULAR | Status: AC
Start: 2014-04-26 — End: 2014-04-27
  Filled 2014-04-26: qty 2

## 2014-04-26 MED ORDER — FENTANYL CITRATE 0.05 MG/ML IJ SOLN
25.0000 ug | INTRAMUSCULAR | Status: DC | PRN
  Administered 2014-04-26 (×3): 50 ug via INTRAVENOUS

## 2014-04-26 MED ORDER — LACTATED RINGERS IV SOLN
INTRAVENOUS | Status: DC | PRN
  Administered 2014-04-26: 09:00:00 via INTRAVENOUS

## 2014-04-26 MED ORDER — FENTANYL CITRATE 0.05 MG/ML IJ SOLN
INTRAMUSCULAR | Status: AC
  Filled 2014-04-26: qty 2

## 2014-04-26 MED ORDER — BIOTENE DRY MOUTH MT LIQD
15.0000 mL | Freq: Two times a day (BID) | OROMUCOSAL | Status: DC
  Administered 2014-04-26: 15 mL via OROMUCOSAL

## 2014-04-26 MED ORDER — PANTOPRAZOLE SODIUM 40 MG PO TBEC
40.0000 mg | DELAYED_RELEASE_TABLET | Freq: Two times a day (BID) | ORAL | Status: DC
  Administered 2014-04-26 – 2014-05-02 (×13): 40 mg via ORAL
  Filled 2014-04-26 (×12): qty 1

## 2014-04-26 MED ORDER — ONDANSETRON HCL 4 MG PO TABS: 4.0000 mg | ORAL_TABLET | Freq: Four times a day (QID) | ORAL | Status: DC | PRN

## 2014-04-26 MED ORDER — GLYCOPYRROLATE 0.2 MG/ML IJ SOLN
INTRAMUSCULAR | Status: DC | PRN
  Administered 2014-04-26: 0.4 mg via INTRAVENOUS

## 2014-04-26 MED ORDER — LIDOCAINE HCL (CARDIAC) 20 MG/ML IV SOLN
INTRAVENOUS | Status: AC
  Filled 2014-04-26: qty 5

## 2014-04-26 MED ORDER — ATORVASTATIN CALCIUM 20 MG PO TABS
20.0000 mg | ORAL_TABLET | Freq: Every day | ORAL | Status: DC
  Administered 2014-04-26 – 2014-05-01 (×6): 20 mg via ORAL
  Filled 2014-04-26 (×3): qty 1
  Filled 2014-04-26: qty 2
  Filled 2014-04-26: qty 1
  Filled 2014-04-26 (×2): qty 2
  Filled 2014-04-26 (×2): qty 1
  Filled 2014-04-26 (×2): qty 2
  Filled 2014-04-26: qty 1

## 2014-04-26 MED ORDER — PREDNISONE 50 MG PO TABS
60.0000 mg | ORAL_TABLET | Freq: Every day | ORAL | Status: DC
  Administered 2014-04-26 – 2014-04-27 (×2): 60 mg via ORAL
  Filled 2014-04-26 (×3): qty 1

## 2014-04-26 MED ORDER — POLYETHYLENE GLYCOL 3350 17 G PO PACK
17.0000 g | PACK | Freq: Every day | ORAL | Status: DC
  Administered 2014-04-26 – 2014-05-02 (×7): 17 g via ORAL
  Filled 2014-04-26 (×9): qty 1

## 2014-04-26 MED ORDER — ALBUTEROL SULFATE HFA 108 (90 BASE) MCG/ACT IN AERS
INHALATION_SPRAY | RESPIRATORY_TRACT | Status: DC | PRN
  Administered 2014-04-26 (×3): 2 via RESPIRATORY_TRACT

## 2014-04-26 MED ORDER — CLOPIDOGREL BISULFATE 75 MG PO TABS
75.0000 mg | ORAL_TABLET | Freq: Every day | ORAL | Status: DC
  Administered 2014-04-27 – 2014-05-02 (×6): 75 mg via ORAL
  Filled 2014-04-26 (×7): qty 1

## 2014-04-26 MED ORDER — LEVOTHYROXINE SODIUM 125 MCG PO TABS
125.0000 ug | ORAL_TABLET | Freq: Every day | ORAL | Status: DC
  Administered 2014-04-27 – 2014-05-02 (×6): 125 ug via ORAL
  Filled 2014-04-26 (×8): qty 1

## 2014-04-26 MED ORDER — PHENYLEPHRINE HCL 10 MG/ML IJ SOLN
INTRAMUSCULAR | Status: DC | PRN
  Administered 2014-04-26: 40 ug via INTRAVENOUS

## 2014-04-26 MED ORDER — NITROGLYCERIN 0.4 MG SL SUBL
SUBLINGUAL_TABLET | SUBLINGUAL | Status: AC
  Administered 2014-04-26: 0.4 mg
  Filled 2014-04-26: qty 2

## 2014-04-26 MED ORDER — ENOXAPARIN SODIUM 40 MG/0.4ML ~~LOC~~ SOLN
40.0000 mg | SUBCUTANEOUS | Status: DC
  Administered 2014-04-26 – 2014-05-01 (×6): 40 mg via SUBCUTANEOUS
  Filled 2014-04-26 (×7): qty 0.4

## 2014-04-26 MED ORDER — ACETAMINOPHEN 325 MG PO TABS: 325.0000 mg | ORAL_TABLET | ORAL | Status: DC | PRN

## 2014-04-26 MED ORDER — LEVALBUTEROL HCL 0.63 MG/3ML IN NEBU: 0.6300 mg | INHALATION_SOLUTION | Freq: Three times a day (TID) | RESPIRATORY_TRACT | Status: DC | PRN

## 2014-04-26 MED ORDER — LACTATED RINGERS IV SOLN: INTRAVENOUS | Status: DC

## 2014-04-26 MED ORDER — ROCURONIUM BROMIDE 50 MG/5ML IV SOLN
INTRAVENOUS | Status: AC
  Filled 2014-04-26: qty 1

## 2014-04-26 MED ORDER — LIDOCAINE-EPINEPHRINE 1 %-1:100000 IJ SOLN
INTRAMUSCULAR | Status: DC | PRN
  Administered 2014-04-26: 30 mL

## 2014-04-26 MED ORDER — EPHEDRINE SULFATE 50 MG/ML IJ SOLN
INTRAMUSCULAR | Status: DC | PRN
  Administered 2014-04-26: 5 mg via INTRAVENOUS

## 2014-04-26 MED ORDER — MIDAZOLAM HCL 2 MG/2ML IJ SOLN
INTRAMUSCULAR | Status: AC
  Filled 2014-04-26: qty 2

## 2014-04-26 MED ORDER — LEVALBUTEROL HCL 0.63 MG/3ML IN NEBU
0.6300 mg | INHALATION_SOLUTION | Freq: Once | RESPIRATORY_TRACT | Status: DC
  Filled 2014-04-26: qty 3

## 2014-04-26 MED ORDER — DULOXETINE HCL 60 MG PO CPEP
60.0000 mg | ORAL_CAPSULE | Freq: Every day | ORAL | Status: DC
  Administered 2014-04-26 – 2014-05-02 (×7): 60 mg via ORAL
  Filled 2014-04-26 (×8): qty 1

## 2014-04-26 MED ORDER — ONDANSETRON HCL 4 MG/2ML IJ SOLN
INTRAMUSCULAR | Status: AC
  Filled 2014-04-26: qty 2

## 2014-04-26 MED ORDER — ACETAMINOPHEN 325 MG PO TABS
650.0000 mg | ORAL_TABLET | Freq: Four times a day (QID) | ORAL | Status: DC | PRN
Start: 2014-04-26 — End: 2014-05-02
  Administered 2014-04-29 – 2014-04-30 (×2): 650 mg via ORAL
  Filled 2014-04-26 (×2): qty 2

## 2014-04-26 MED ORDER — PROPOFOL 10 MG/ML IV BOLUS
INTRAVENOUS | Status: DC | PRN
  Administered 2014-04-26: 20 mg via INTRAVENOUS
  Administered 2014-04-26: 10 mg via INTRAVENOUS
  Administered 2014-04-26: 120 mg via INTRAVENOUS

## 2014-04-26 MED ORDER — INSULIN ASPART 100 UNIT/ML ~~LOC~~ SOLN
0.0000 [IU] | Freq: Three times a day (TID) | SUBCUTANEOUS | Status: DC
  Administered 2014-04-26: 1 [IU] via SUBCUTANEOUS
  Administered 2014-04-27 (×2): 2 [IU] via SUBCUTANEOUS
  Administered 2014-04-27: 1 [IU] via SUBCUTANEOUS
  Administered 2014-04-28 (×2): 2 [IU] via SUBCUTANEOUS
  Administered 2014-04-28 – 2014-04-29 (×3): 1 [IU] via SUBCUTANEOUS
  Administered 2014-04-29: 2 [IU] via SUBCUTANEOUS
  Administered 2014-04-30 (×3): 1 [IU] via SUBCUTANEOUS
  Administered 2014-05-01: 2 [IU] via SUBCUTANEOUS
  Administered 2014-05-01: 1 [IU] via SUBCUTANEOUS
  Administered 2014-05-01: 2 [IU] via SUBCUTANEOUS

## 2014-04-26 MED ORDER — ONDANSETRON HCL 4 MG/2ML IJ SOLN: 4.0000 mg | Freq: Once | INTRAMUSCULAR | Status: DC | PRN

## 2014-04-26 MED ORDER — NEOSTIGMINE METHYLSULFATE 10 MG/10ML IV SOLN
INTRAVENOUS | Status: AC
  Filled 2014-04-26: qty 1

## 2014-04-26 MED ORDER — PROPOFOL 10 MG/ML IV BOLUS
INTRAVENOUS | Status: AC
  Filled 2014-04-26: qty 20

## 2014-04-26 MED ORDER — ACETAMINOPHEN 650 MG RE SUPP: 650.0000 mg | Freq: Four times a day (QID) | RECTAL | Status: DC | PRN

## 2014-04-26 MED ORDER — SODIUM CHLORIDE 0.9 % IJ SOLN
3.0000 mL | Freq: Two times a day (BID) | INTRAMUSCULAR | Status: DC
  Administered 2014-04-27 – 2014-05-02 (×9): 3 mL via INTRAVENOUS

## 2014-04-26 MED ORDER — DEXAMETHASONE SODIUM PHOSPHATE 10 MG/ML IJ SOLN
INTRAMUSCULAR | Status: AC
  Administered 2014-04-26: 10 mg via INTRAVENOUS
  Filled 2014-04-26: qty 1

## 2014-04-26 MED ORDER — ROCURONIUM BROMIDE 100 MG/10ML IV SOLN
INTRAVENOUS | Status: DC | PRN
  Administered 2014-04-26: 30 mg via INTRAVENOUS

## 2014-04-26 MED ORDER — FENTANYL CITRATE 0.05 MG/ML IJ SOLN
INTRAMUSCULAR | Status: DC | PRN
  Administered 2014-04-26: 50 ug via INTRAVENOUS
  Administered 2014-04-26 (×2): 75 ug via INTRAVENOUS
  Administered 2014-04-26: 50 ug via INTRAVENOUS

## 2014-04-26 MED ORDER — ALUM & MAG HYDROXIDE-SIMETH 200-200-20 MG/5ML PO SUSP: 30.0000 mL | Freq: Three times a day (TID) | ORAL | Status: DC | PRN

## 2014-04-26 MED ORDER — GABAPENTIN 300 MG PO CAPS
300.0000 mg | ORAL_CAPSULE | Freq: Two times a day (BID) | ORAL | Status: DC
  Administered 2014-04-26 – 2014-04-28 (×5): 300 mg via ORAL
  Administered 2014-04-28: 600 mg via ORAL
  Administered 2014-04-29 – 2014-05-02 (×7): 300 mg via ORAL
  Filled 2014-04-26 (×4): qty 2
  Filled 2014-04-26 (×4): qty 1
  Filled 2014-04-26 (×3): qty 2
  Filled 2014-04-26: qty 1
  Filled 2014-04-26: qty 2
  Filled 2014-04-26: qty 1
  Filled 2014-04-26 (×4): qty 2

## 2014-04-26 MED ORDER — ASPIRIN EC 325 MG PO TBEC
325.0000 mg | DELAYED_RELEASE_TABLET | ORAL | Status: AC
  Administered 2014-04-26: 325 mg via ORAL
  Filled 2014-04-26: qty 1

## 2014-04-26 MED ORDER — LIDOCAINE HCL (CARDIAC) 20 MG/ML IV SOLN
INTRAVENOUS | Status: DC | PRN
Start: 2014-04-26 — End: 2014-04-26
  Administered 2014-04-26: 100 mg via INTRAVENOUS

## 2014-04-26 MED ORDER — DOCUSATE SODIUM 100 MG PO CAPS: 100.0000 mg | ORAL_CAPSULE | Freq: Every day | ORAL | Status: DC | PRN

## 2014-04-26 MED ORDER — CEFAZOLIN SODIUM-DEXTROSE 2-3 GM-% IV SOLR
INTRAVENOUS | Status: DC | PRN
  Administered 2014-04-26: 2 g via INTRAVENOUS

## 2014-04-26 MED ORDER — BACITRACIN ZINC 500 UNIT/GM EX OINT
TOPICAL_OINTMENT | CUTANEOUS | Status: AC
  Filled 2014-04-26: qty 15

## 2014-04-26 MED ORDER — LIDOCAINE-EPINEPHRINE 1 %-1:100000 IJ SOLN
INTRAMUSCULAR | Status: AC
  Filled 2014-04-26: qty 1

## 2014-04-26 MED ORDER — ALPRAZOLAM 0.5 MG PO TABS
0.5000 mg | ORAL_TABLET | Freq: Two times a day (BID) | ORAL | Status: DC
  Administered 2014-04-26 – 2014-04-28 (×6): 1 mg via ORAL
  Filled 2014-04-26 (×6): qty 2

## 2014-04-26 MED ORDER — PHENYLEPHRINE 40 MCG/ML (10ML) SYRINGE FOR IV PUSH (FOR BLOOD PRESSURE SUPPORT)
PREFILLED_SYRINGE | INTRAVENOUS | Status: AC
  Filled 2014-04-26: qty 10

## 2014-04-26 SURGICAL SUPPLY — 35 items
ADH SKN CLS APL DERMABOND .7 (GAUZE/BANDAGES/DRESSINGS) ×1
BLADE 10 SAFETY STRL DISP (BLADE) ×3 IMPLANT
CANISTER SUCTION 2500CC (MISCELLANEOUS) ×3 IMPLANT
CLEANER TIP ELECTROSURG 2X2 (MISCELLANEOUS) ×3 IMPLANT
CONT SPEC 4OZ CLIKSEAL STRL BL (MISCELLANEOUS) ×3 IMPLANT
CORDS BIPOLAR (ELECTRODE) ×3 IMPLANT
COVER SURGICAL LIGHT HANDLE (MISCELLANEOUS) ×3 IMPLANT
DERMABOND ADVANCED (GAUZE/BANDAGES/DRESSINGS) ×2
DERMABOND ADVANCED .7 DNX12 (GAUZE/BANDAGES/DRESSINGS) ×1 IMPLANT
ELECT COATED BLADE 2.86 ST (ELECTRODE) ×3 IMPLANT
ELECT REM PT RETURN 9FT ADLT (ELECTROSURGICAL) ×3
ELECTRODE REM PT RTRN 9FT ADLT (ELECTROSURGICAL) ×1 IMPLANT
GLOVE BIO SURGEON STRL SZ8 (GLOVE) ×2 IMPLANT
GLOVE BIOGEL PI IND STRL 8 (GLOVE) IMPLANT
GLOVE BIOGEL PI INDICATOR 8 (GLOVE) ×2
GLOVE ECLIPSE 7.5 STRL STRAW (GLOVE) ×3 IMPLANT
GLOVE SURG SS PI 7.0 STRL IVOR (GLOVE) ×2 IMPLANT
GOWN STRL REUS W/ TWL LRG LVL3 (GOWN DISPOSABLE) ×2 IMPLANT
GOWN STRL REUS W/TWL LRG LVL3 (GOWN DISPOSABLE) ×6
KIT BASIN OR (CUSTOM PROCEDURE TRAY) ×3 IMPLANT
KIT ROOM TURNOVER OR (KITS) ×3 IMPLANT
NS IRRIG 1000ML POUR BTL (IV SOLUTION) ×3 IMPLANT
PAD ARMBOARD 7.5X6 YLW CONV (MISCELLANEOUS) ×6 IMPLANT
PENCIL FOOT CONTROL (ELECTRODE) ×3 IMPLANT
SPECIMEN JAR SMALL (MISCELLANEOUS) ×3 IMPLANT
STAPLER VISISTAT 35W (STAPLE) ×3 IMPLANT
SUT CHROMIC 3 0 PS 2 (SUTURE) ×3 IMPLANT
SUT ETHILON 2 0 FS 18 (SUTURE) ×3 IMPLANT
SUT ETHILON 4 0 PS 2 18 (SUTURE) ×2 IMPLANT
SUT SILK 2 0 SH CR/8 (SUTURE) ×3 IMPLANT
SUT SILK 3 0 REEL (SUTURE) ×3 IMPLANT
SUT SILK 4 0 TIES 17X18 (SUTURE) ×3 IMPLANT
SUT VIC AB 3-0 PS2 18 (SUTURE) ×6
SUT VIC AB 3-0 PS2 18XBRD (SUTURE) IMPLANT
TRAY ENT MC OR (CUSTOM PROCEDURE TRAY) ×3 IMPLANT

## 2014-04-26 NOTE — Transfer of Care (Addendum)
Immediate Anesthesia Transfer of Care Note  Patient: Madison Henson  Procedure(s) Performed: Procedure(s): WIDE LOCAL EXCISION OF NECK SKIN LESION/IMMEDIATE SUBCUTANEOUS TISSUE WITH LOCAL RECONSTRUCTION (N/A)  Patient Location: PACU  Anesthesia Type:General  Level of Consciousness: patient cooperative and responds to stimulation  Airway & Oxygen Therapy: Patient Spontanous Breathing and Patient connected to nasal cannula oxygen  Post-op Assessment: Report given to PACU RN and Post -op Vital signs reviewed and stable; pt c/o L) chest pain--EKG ordered. NTG SL, ASA 325mg   Post vital signs: Reviewed and stable  Complications: No apparent anesthesia complications

## 2014-04-26 NOTE — Progress Notes (Signed)
Pt arrived to pacu with c/o chest pain.  Md notified

## 2014-04-26 NOTE — Progress Notes (Signed)
04/26/2014 6:34 PM  Madison Henson [id number]  Post-Op Check   Temp:  [97.5 F (36.4 C)-97.8 F (36.6 C)] 97.8 F (36.6 C) (06/12 1313) Pulse Rate:  [60-78] 66 (06/12 1313) Resp:  [13-22] 18 (06/12 1313) BP: (119-159)/(51-88) 119/61 mmHg (06/12 1313) SpO2:  [92 %-99 %] 98 % (06/12 1313) Weight:  [87.544 kg (193 lb)-96 kg (211 lb 10.3 oz)] 96 kg (211 lb 10.3 oz) (06/12 1313),     Intake/Output Summary (Last 24 hours) at 04/26/14 1834 Last data filed at 04/26/14 1600  Gross per 24 hour  Intake    770 ml  Output    300 ml  Net    470 ml    Results for orders placed during the hospital encounter of 04/26/14 (from the past 24 hour(s))  GLUCOSE, CAPILLARY     Status: None   Collection Time    04/26/14  8:02 AM      Result Value Ref Range   Glucose-Capillary 91  70 - 99 mg/dL  GLUCOSE, CAPILLARY     Status: None   Collection Time    04/26/14 11:20 AM      Result Value Ref Range   Glucose-Capillary 99  70 - 99 mg/dL  BASIC METABOLIC PANEL     Status: Abnormal   Collection Time    04/26/14  4:35 PM      Result Value Ref Range   Sodium 144  137 - 147 mEq/L   Potassium 4.0  3.7 - 5.3 mEq/L   Chloride 105  96 - 112 mEq/L   CO2 25  19 - 32 mEq/L   Glucose, Bld 146 (*) 70 - 99 mg/dL   BUN 10  6 - 23 mg/dL   Creatinine, Ser 0.86  0.50 - 1.10 mg/dL   Calcium 8.9  8.4 - 10.5 mg/dL   GFR calc non Af Amer 61 (*) >90 mL/min   GFR calc Af Amer 70 (*) >90 mL/min  CBC     Status: None   Collection Time    04/26/14  4:35 PM      Result Value Ref Range   WBC 8.9  4.0 - 10.5 K/uL   RBC 4.19  3.87 - 5.11 MIL/uL   Hemoglobin 12.8  12.0 - 15.0 g/dL   HCT 38.9  36.0 - 46.0 %   MCV 92.8  78.0 - 100.0 fL   MCH 30.5  26.0 - 34.0 pg   MCHC 32.9  30.0 - 36.0 g/dL   RDW 12.8  11.5 - 15.5 %   Platelets 208  150 - 400 K/uL  TROPONIN I     Status: None   Collection Time    04/26/14  4:35 PM      Result Value Ref Range   Troponin I <0.30  <0.30 ng/mL  D-DIMER, QUANTITATIVE     Status:  Abnormal   Collection Time    04/26/14  4:35 PM      Result Value Ref Range   D-Dimer, Quant 0.68 (*) 0.00 - 0.48 ug/mL-FEU  GLUCOSE, CAPILLARY     Status: Abnormal   Collection Time    04/26/14  5:05 PM      Result Value Ref Range   Glucose-Capillary 137 (*) 70 - 99 mg/dL    SUBJECTIVE:  Min neck pain.  No more SOB nor chest pain  OBJECTIVE:  Wound flat and intact.  Eating dinner.  IMPRESSION:  Satisfactory check  PLAN:  Ice, elevation.  Home when cardiology feels  this would be OK.  Jodi Marble

## 2014-04-26 NOTE — Anesthesia Postprocedure Evaluation (Signed)
  Anesthesia Post-op Note  Patient: Madison Henson  Procedure(s) Performed: Procedure(s): WIDE LOCAL EXCISION OF NECK SKIN LESION/IMMEDIATE SUBCUTANEOUS TISSUE WITH LOCAL RECONSTRUCTION (N/A)  Patient Location: PACU  Anesthesia Type:General  Level of Consciousness: awake and alert   Airway and Oxygen Therapy: Patient Spontanous Breathing and Patient connected to nasal cannula oxygen  Post-op Pain: mild  Post-op Assessment: Post-op Vital signs reviewed, Patient's Cardiovascular Status Stable, Respiratory Function Stable, Patent Airway, No signs of Nausea or vomiting and Pain level controlled  Post-op Vital Signs: Reviewed and stable  Last Vitals:  Filed Vitals:   04/26/14 1313  BP: 119/61  Pulse: 66  Temp: 36.6 C  Resp: 18    Complications: No apparent anesthesia complications

## 2014-04-26 NOTE — H&P (Signed)
Madison Henson is an 80 y.o. female.   Chief Complaint: Left lat cutaneous Lesion HPI: Chronic ulcerated Left skin lesion  Past Medical History  Diagnosis Date  . Coronary artery disease     post diaphragmatic wall infarction on 06-2007,due to stent thrombosis   . Chronic obstructive pulmonary disease   . Tobacco abuse   . Hypertension   . Hyperlipidemia   . GERD (gastroesophageal reflux disease)   . Anxiety disorder   . Depression   . Hypothyroidism   . Hx of hysterectomy   . AAA (abdominal aortic aneurysm)   . Pulmonary nodule 04/2011    neg bx, followed by Dr. Arlyce Dice, may still be cancer, being followed with CTs.  . Esophageal dysmotility     Two BPE since 03/2011-->Moderate impairment of esophageal motility.Vallecular residuals. Laryngeal penetration  . Anemia   . Diabetes mellitus     with gastroparesis 70% retention at 2 hours  . Cancer   . Lung cancer   . CHF (congestive heart failure)   . Myocardial infarction   . AAA (abdominal aortic aneurysm) without rupture 09/25/2013    Past Surgical History  Procedure Laterality Date  . Cholecystectomy    . Cataract surg    . Bladder surgery      tack  . Kidney stone surgery    . Coronary stent placement  2004    2  . Coronary stent placement  2008    2  . S/p hysterectomy    . Neck surgery    . Esophagogastroduodenoscopy  5/08    normal esophagus, No H.Pylori  . Egd/bravo  07/2008    On Nexium BID. Day 1 DMSTR Score: 0.7, day 2: DMSTR score: 32, uncontrolled GERD, No H. Pylori  . Hbt  2009    negative  . Colonoscopy  05/2010    tortuous sigm colon, sigm tics, multiple simple adenomas, hemorrhoids, next TCS 10-15 yrs per Dr. Oneida Alar. Previous h/o tubular adenomas in 2008.   Marland Kitchen Esophagogastroduodenoscopy  05/2010    small hh, streaky erythema in body/antrum. Duodenal bx negative.   . Esophagogastroduodenoscopy  06/22/2011    mild gastritis/ring in the distal esophagus, savary dilation. Bx reactive gastropathy, No H.pylori   . Savory dilation  06/22/2011    Procedure: SAVORY DILATION;  Surgeon: Dorothyann Peng, MD;  Location: [facility];  Service: Endoscopy;  Laterality: N/A;  [name] dilation  06/22/2011    Procedure: [name] DILATION;  Surgeon: Dorothyann Peng, MD;  Location: [facility];  Service: Endoscopy;  Laterality: N/A;  . Video brochoscopy with electromagnetic navigaton  05/10/2011    [name]  . Right lower lobe superior segmentectomy  October 2012  . Hysterectomy at age 89    . Cardiac catheterization    . Abdominal hysterectomy    . Esophagogastroduodenoscopy N/A 08/14/2013    Procedure: ESOPHAGOGASTRODUODENOSCOPY (EGD);  Surgeon: Danie Binder, MD;  Location: [facility];  Service: Endoscopy;  Laterality: N/A;  2:45  . Colonoscopy N/A 11/02/2013    Procedure: COLONOSCOPY;  Surgeon: Danie Binder, MD;  Location: [facility];  Service: Endoscopy;  Laterality: N/A;  1:15  . Hemorrhoid banding N/A 11/02/2013    Procedure: HEMORRHOID BANDING;  Surgeon: Danie Binder, MD;  Location: [facility];  Service: Endoscopy;  Laterality: N/A;  . Agile capsule N/A 11/12/2013    Procedure: AGILE CAPSULE;  Surgeon: Danie Binder, MD;  Location: [facility];  Service: Endoscopy;  Laterality: N/A;  8:00  . Bacterial overgrowth test N/A 11/16/2013    Procedure: BACTERIAL OVERGROWTH TEST;  Surgeon: Danie Binder, MD;  Location: [facility];  Service: Endoscopy;  Laterality: N/A;  8:00    Family History  Problem Relation Age of Onset  . Coronary artery disease      family hx of  . Colon cancer Sister 18  . Colon cancer Daughter   . Prostate cancer      family hx of  . Lung cancer      family hx of  . Ovarian cancer      family hx of  . Arthritis      family hx of  . Other      family hx of chronic respiratory condition  . Heart disease Mother   . Stroke Mother    Social History:  reports that she has been smoking Cigarettes.  She has a 20 pack-year smoking history. She has never  used smokeless tobacco. She reports that she does not drink alcohol or use illicit drugs.  Allergies:  Allergies  Allergen Reactions  . Linzess [Linaclotide] Other (See Comments)    EXPLOSIVE DIARRHEA  . Oxycodone Other (See Comments)    Causes hyperactivity   . Sulfonamide Derivatives Swelling and Rash    Hands & feet swell  . Hydrocodone Other (See Comments)    Causes Hyperactivity     Medications Prior to Admission  Medication Sig Dispense Refill  . albuterol (PROAIR HFA) 108 (90 BASE) MCG/ACT inhaler Inhale 2 puffs into the lungs every 6 (six) hours as needed. For wheezing      . ALPRAZolam (XANAX) 1 MG tablet Take 0.5-1 mg by mouth 2 (two) times daily. Take 0.5 mg in am and 1 mg in pm      . amLODipine (NORVASC) 5 MG tablet Take 5 mg by mouth daily.      Marland Kitchen atorvastatin (LIPITOR) 20 MG tablet Take 20 mg by mouth at bedtime.       . clopidogrel (PLAVIX) 75 MG tablet Take 75 mg by mouth daily with breakfast.      . cyanocobalamin (,VITAMIN B-12,) 1000 MCG/ML injection Inject 1,000 mcg into the muscle every 30 (thirty) days.      Marland Kitchen docusate sodium (COLACE) 100 MG capsule Take 100 mg by mouth daily as needed for mild constipation.      . gabapentin (NEURONTIN) 300 MG capsule Take 300-600 mg by mouth 2 (two) times daily. Take 300mg  in the morning & 600mg  at bedtime      . HYDROcodone-acetaminophen (LORTAB) 10-500 MG per tablet Take 1 tablet by mouth every 6 (six) hours as needed for pain.      . hydrocortisone (ANUSOL-HC) 2.5 % rectal cream Place 1 application rectally 4 (four) times daily.      Marland Kitchen JANUVIA 50 MG tablet Take 50 mg by mouth daily.       Marland Kitchen levothyroxine (SYNTHROID, LEVOTHROID) 125 MCG tablet Take 125 mcg by mouth daily before breakfast.      . nitroGLYCERIN (NITROSTAT) 0.4 MG SL tablet Place 0.4 mg under the tongue every 5 (five) minutes as needed for chest pain.      . pantoprazole (PROTONIX) 40 MG tablet Take 40 mg by mouth 2 (two) times daily.       . polyethylene glycol  (MIRALAX / GLYCOLAX) packet Take 17 g by mouth daily.  14 each  0  . Vitamin D, Ergocalciferol, (DRISDOL) 50000 UNITS CAPS capsule Take 50,000 Units  by mouth every 7 (seven) days.      . DULoxetine (CYMBALTA) 60 MG capsule Take 60 mg by mouth daily.        Results for orders placed during the hospital encounter of 04/26/14 (from the past 48 hour(s))  GLUCOSE, CAPILLARY     Status: None   Collection Time    04/26/14  8:02 AM      Result Value Ref Range   Glucose-Capillary 91  70 - 99 mg/dL   No results found.  Review of Systems  Constitutional: Negative.   HENT: Negative.   Respiratory: Negative.   Cardiovascular: Negative.   Gastrointestinal: Negative.   Musculoskeletal: Negative.   Neurological: Negative.     Blood pressure 159/88, pulse 78, temperature 97.5 F (36.4 C), temperature source Oral, resp. rate 20, weight 87.544 kg (193 lb), SpO2 97.00%. Physical Exam  Constitutional: She is oriented to person, place, and time. She appears well-developed and well-nourished.  Neck: Normal range of motion. Neck supple.  2 cm ulcerated lesion lt lat neck skin  Cardiovascular: Normal rate.   Respiratory: Effort normal.  Musculoskeletal: Normal range of motion.  Neurological: She is alert and oriented to person, place, and time.     Assessment/Plan Adm for wide local Excision and reconstruction of Left lateral cutaneous lesion.   [place], Janitza Revuelta 04/26/2014, 9:24 AM

## 2014-04-26 NOTE — Brief Op Note (Signed)
04/26/2014  11:03 AM  PATIENT:  Madison Henson  80 y.o. female  PRE-OPERATIVE DIAGNOSIS:  SKIN LESIONS  POST-OPERATIVE DIAGNOSIS:  SKIN LESIONS  PROCEDURE:  WIDE LOCAL EXCISION OF LEFT SKIN LESION WITH COMPLEX RECONSTRUCTION (7CM)  SURGEON:  Surgeon(s) and Role:    * Jerrell Belfast, MD - Primary  PHYSICIAN ASSISTANT: Etta Grandchild, PA  ASSISTANTS: none   ANESTHESIA:   general  EBL:  Total I/O In: 500 [I.V.:500] Out: - Min  BLOOD ADMINISTERED:none  DRAINS: none   LOCAL MEDICATIONS USED:  LIDOCAINE  and Amount: 3 ml  SPECIMEN:  Source of Specimen:  Left neck lesion  DISPOSITION OF SPECIMEN:  PATHOLOGY  COUNTS:  YES  TOURNIQUET:  * No tourniquets in log *  DICTATION: .Other Dictation: Dictation Number [id number]  PLAN OF CARE: Discharge to home after PACU  PATIENT DISPOSITION:  PACU - hemodynamically stable.   Delay start of Pharmacological VTE agent (>24hrs) due to surgical blood loss or risk of bleeding: not applicable

## 2014-04-26 NOTE — Progress Notes (Signed)
Dr. Moser at bedside.

## 2014-04-26 NOTE — Op Note (Signed)
NAME:  Madison Henson, Madison Henson NO.:  [id number]  MEDICAL RECORD NO.:  [id number]  LOCATION:  [id number]                        FACILITY:  [facility]  PHYSICIAN:  Early Chars. Wilburn Cornelia, M.D.DATE OF BIRTH:  Aug 03, 1928  DATE OF PROCEDURE:  04/26/2014 DATE OF DISCHARGE:                              OPERATIVE REPORT   PREOPERATIVE DIAGNOSIS:  Left lateral neck cutaneous lesion.  POSTOPERATIVE DIAGNOSIS:  Left lateral neck cutaneous lesion.  INDICATION FOR SURGERY:  Left lateral neck cutaneous lesion.  SURGICAL PROCEDURE:  Wide local excision of left lateral neck lesion including deep muscular layer with complex closure (7 cm).  ANESTHESIA:  General endotracheal.  COMPLICATIONS:  None.  ESTIMATED BLOOD LOSS:  Minimal.  The patient transferred from the operating room to the recovery room in stable condition.  BRIEF HISTORY:  The patient is an 80 year old white female who is referred to our office for evaluation of a chronic painful skin lesion involving the left neck.  The patient reported several year history of gradually enlarging ulcerated mass with significant discomfort in the left lateral neck below the angle of the mandible.  She had multiple previous skin cancers which have been excised by her dermatologist in the past.  Given the patient's history and findings, we obtained a CT scan, which showed a superficial ulcerative mass with some enhancement of the immediate subcutaneous and platysmal layer.  No evidence of metastatic disease or lymphadenopathy.  Given history and findings, and after appropriate cardiac clearance, the patient was scheduled for wide local excision of the lesion and complex reconstruction.  The risks and benefits were discussed in detail with the patient and her daughter and they understood and concurred with our plan for surgery which is scheduled on elective basis at [facility] under general anesthesia.  DESCRIPTION OF PROCEDURE:   The patient was brought to the operating room on April 26, 2014, placed in supine position on the operating table. General endotracheal anesthesia was established without difficulty. When the patient was adequately anesthetized, she was positioned and prepped and draped.  First, her skin was injected with 3 mL of 1% lidocaine and 1:100,000 solution epinephrine was injected in subcutaneous fashion in the soft tissue surrounding the left lateral neck lesion.  The lesion itself measured 1 x 3 cm and was an ulcerated oval mass with chronic inflammatory changes and some adhesion to the immediate subcutaneous tissue.  With the patient positioned and prepped, the surgical procedure was begun by creating a horizontally oriented incision in a preexisting skin crease.  There was an elliptical incision with a 1 cm margin around the ulcerative lesion.  The incision was carried through the skin underlying subcutaneous tissue using a 15 scalpel.  Dissection was then carried out using Bovie electrocautery and dissecting to the level of the platysma muscle.  The platysma muscle was divided and the soft tissue including platysma muscle immediately adjacent to the lesion was completely excised.  This was then sent to path which was then marked and sent to Pathology for gross microscopic evaluation.  With the lesion excised, the defect measured 3 x 7 cm.  In order to allow adequate closure, subplatysmal flaps were developed superiorly  and inferiorly to add extra skin laxity to allow for adequate closure. After the subplatysmal flaps were elevated, soft tissue came together adequately and closure was undertaken with 3-0 and 4-0 Vicryl in an interrupted fashion.  Platysma muscle was reapproximated with an interrupted 3-0 Vicryl suture.  Deep subcutaneous tissue was closed with a 3-0 Vicryl suture in a horizontal mattressing fashion.  Immediate subcutaneous tissue was closed with interrupted 4-0 Vicryl suture  and the final skin edge to approximate the skin tissue was closed with interrupted 4-0 Ethilon suture.  There was no bleeding.  The patient tolerated the procedure without complications.  She was then awakened from anesthetic, extubated, and transferred from the operating room to the recovery room in stable condition.          ______________________________ Early Chars Wilburn Cornelia, M.D.     DLS/MEDQ  D:  [id number]  T:  04/26/2014  Job:  [id number]  cc:   Michelene Gardener., M.D.

## 2014-04-26 NOTE — Consult Note (Signed)
Cardiologist: Madison Henson Reason for Consult:Chest Pain Referring Physician:   [name] is an 80 y.o. female.  HPI:   80 yo WF with history of CAD, HTN, continued Tobacco abuse, Hyperlipidemia, LBBB, DM, GERD, anxiety/depression.  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 A new DES was placed at that time. She previously had a Cypher stent in the circumflex artery as well. Event monitor several years ago with isolated PVCs and APCs. She has chronic shortness of breath related to her COPD. She had an MRI in June 2011 which showed a 3.6 x 3.6 cm infrarenal aortic aneurysm. She has chronic back pain felt to be secondary lumbar disc bulging but not felt to be a surgical candidate. She also has esophageal strictures and had an esophageal dilatation in 2012. She has been on Simvastatin for years. Her lipids are followed in primary care. Last cardiac cath 07/09/11 which showed mild to moderate stable CAD. She had a right lower lobectomy for stage IA non-small cell lung cancer per Dr. Arlyce Dice in October 2012. She has been on Plavix chronically with first generation DES. Her ASA was stopped in 2013. She has been continued on Plavix.   She just had a nuclear stress test on Apr 03, 2014 which revealed EF of 53%, normal LVF and wall motion.  It was considered Intermediate risk stress nuclear study with a mostly fixed anteroseptal and apical fixed defect, consistent with possible scar or rate-related LBBB artifact.  She presented for excision of a skin lesion.  Post-op she developed chest pain. It was about 9/10 and currently 6/10.  Worse with deep breath and cough.  It did not radiated to arm, neck, back or jaw but did radiate from epigastric up towards throat.  She was given a SL NTG but did not help.  She does have some congestion. The patient currently denies nausea, vomiting, fever, orthopnea, dizziness, PND, cough, abdominal pain, hematochezia,  melena, lower extremity edema.    Past Medical History  Diagnosis Date  . Coronary artery disease     post diaphragmatic wall infarction on 06-2007,due to stent thrombosis   . Chronic obstructive pulmonary disease   . Tobacco abuse   . Hypertension   . Hyperlipidemia   . GERD (gastroesophageal reflux disease)   . Anxiety disorder   . Depression   . Hypothyroidism   . Hx of hysterectomy   . AAA (abdominal aortic aneurysm)   . Pulmonary nodule 04/2011    neg bx, followed by Dr. Arlyce Dice, may still be cancer, being followed with CTs.  . Esophageal dysmotility     Two BPE since 03/2011-->Moderate impairment of esophageal motility.Vallecular residuals. Laryngeal penetration  . Anemia   . Diabetes mellitus     with gastroparesis 70% retention at 2 hours  . Cancer   . Lung cancer   . CHF (congestive heart failure)   . Myocardial infarction   . AAA (abdominal aortic aneurysm) without rupture 09/25/2013    Past Surgical History  Procedure Laterality Date  . Cholecystectomy    . Cataract surg    . Bladder surgery      tack  . Kidney stone surgery    . Coronary stent placement  2004    2  . Coronary stent placement  2008    2  . S/p hysterectomy    . Neck surgery    . Esophagogastroduodenoscopy  5/08    normal esophagus, No H.Pylori  .  Egd/bravo  07/2008    On Nexium BID. Day 1 DMSTR Score: 0.7, day 2: DMSTR score: 32, uncontrolled GERD, No H. Pylori  . Hbt  2009    negative  . Colonoscopy  05/2010    tortuous sigm colon, sigm tics, multiple simple adenomas, hemorrhoids, next TCS 10-15 yrs per Dr. Oneida Alar. Previous h/o tubular adenomas in 2008.   Marland Kitchen Esophagogastroduodenoscopy  05/2010    small hh, streaky erythema in body/antrum. Duodenal bx negative.   . Esophagogastroduodenoscopy  06/22/2011    mild gastritis/ring in the distal esophagus, savary dilation. Bx reactive gastropathy, No H.pylori  . Savory dilation  06/22/2011    Procedure: SAVORY DILATION;  Surgeon: Dorothyann Peng, MD;   [facility];  Service: Endoscopy;  Laterality: N/A;  [name] dilation  06/22/2011    Procedure: [name] DILATION;  Surgeon: Dorothyann Peng, MD;  [facility];  Service: Endoscopy;  Laterality: N/A;  . Video brochoscopy with electromagnetic navigaton  05/10/2011    [name]  . Right lower lobe superior segmentectomy  October 2012  . Hysterectomy at age 56    . Cardiac catheterization    . Abdominal hysterectomy    . Esophagogastroduodenoscopy N/A 08/14/2013    Procedure: ESOPHAGOGASTRODUODENOSCOPY (EGD);  Surgeon: Danie Binder, MD;  [facility];  Service: Endoscopy;  Laterality: N/A;  2:45  . Colonoscopy N/A 11/02/2013    Procedure: COLONOSCOPY;  Surgeon: Danie Binder, MD;  [facility];  Service: Endoscopy;  Laterality: N/A;  1:15  . Hemorrhoid banding N/A 11/02/2013    Procedure: HEMORRHOID BANDING;  Surgeon: Danie Binder, MD;  [facility];  Service: Endoscopy;  Laterality: N/A;  . Agile capsule N/A 11/12/2013    Procedure: AGILE CAPSULE;  Surgeon: Danie Binder, MD;  [facility];  Service: Endoscopy;  Laterality: N/A;  8:00  . Bacterial overgrowth test N/A 11/16/2013    Procedure: BACTERIAL OVERGROWTH TEST;  Surgeon: Danie Binder, MD;  [facility];  Service: Endoscopy;  Laterality: N/A;  8:00    Family History  Problem Relation Age of Onset  . Coronary artery disease      family hx of  . Colon cancer Sister 10  . Colon cancer Daughter   . Prostate cancer      family hx of  . Lung cancer      family hx of  . Ovarian cancer      family hx of  . Arthritis      family hx of  . Other      family hx of chronic respiratory condition  . Heart disease Mother   . Stroke Mother     Social History:  reports that she has been smoking Cigarettes.  She has a 20 pack-year smoking history. She has never used smokeless tobacco. She reports that she does not drink alcohol or use illicit  drugs.  Allergies:  Allergies  Allergen Reactions  . Linzess [Linaclotide] Other (See Comments)    EXPLOSIVE DIARRHEA  . Oxycodone Other (See Comments)    Causes hyperactivity   . Sulfonamide Derivatives Swelling and Rash    Hands & feet swell  . Hydrocodone Other (See Comments)    Causes Hyperactivity     Medications:  Scheduled Meds: . fentaNYL      . fentaNYL      . nitroGLYCERIN       Continuous Infusions: . lactated ringers  PRN Meds:.alum & mag hydroxide-simeth   Results for orders placed during the hospital encounter of 04/26/14 (from the past 48 hour(s))  GLUCOSE, CAPILLARY     Status: None   Collection Time    04/26/14  8:02 AM      Result Value Ref Range   Glucose-Capillary 91  70 - 99 mg/dL  GLUCOSE, CAPILLARY     Status: None   Collection Time    04/26/14 11:20 AM      Result Value Ref Range   Glucose-Capillary 99  70 - 99 mg/dL    No results found.  Review of Systems  Constitutional: Negative for fever and diaphoresis.  HENT: Positive for congestion.   Respiratory: Positive for shortness of breath. Negative for cough.   Cardiovascular: Positive for chest pain. Negative for orthopnea, leg swelling and PND.  Gastrointestinal: Negative for nausea, vomiting, abdominal pain, blood in stool and melena.  Genitourinary: Negative for hematuria.  Musculoskeletal: Positive for neck pain.  Neurological: Negative for dizziness.  All other systems reviewed and are negative.  Blood pressure 119/61, pulse 66, temperature 97.8 F (36.6 C), temperature source Oral, resp. rate 18, height 5\' 9"  (1.753 m), weight 211 lb 10.3 oz (96 kg), SpO2 98.00%. Physical Exam  Nursing note and vitals reviewed. Constitutional: She is oriented to person, place, and time. She appears well-developed. No distress.  obese  HENT:  Head: Normocephalic and atraumatic.  Mouth/Throat: Oropharynx is clear and moist. No oropharyngeal exudate.  Eyes: EOM are normal. Pupils are equal,  round, and reactive to light. No scleral icterus.  Neck: Normal range of motion. Neck supple.  Cardiovascular: Normal rate, regular rhythm, S1 normal and S2 normal.   Murmur heard.  Systolic murmur is present with a grade of 1/6  Pulses:      Radial pulses are 2+ on the right side, and 2+ on the left side.       Dorsalis pedis pulses are 1+ on the right side, and 1+ on the left side.  Soft right carotid bruit  Respiratory: Effort normal. She has wheezes (Right side). She has no rales. She exhibits tenderness (distal Sternum ).  Rhonchi cleared with cough   GI: Soft. Bowel sounds are normal. She exhibits no distension. There is no tenderness.  Musculoskeletal: She exhibits no edema.  Lymphadenopathy:    She has no cervical adenopathy.  Neurological: She is alert and oriented to person, place, and time. She exhibits normal muscle tone.  Skin: Skin is warm and dry.  Psychiatric: She has a normal mood and affect.    Assessment/Plan: Principal Problem:   Skin lesion  Active Problems:   Chest pain, atypical Noncardiac CP which is worse with cough, deep inspiration, and sternum is tender.  EKG shows chronic LBBB with no acute.  On telemetry she is having PVCs which keeps ringing out as VT.   Recent nonischemic NST showing scar and EF 53%.  She is wheezing on exam.  Will give Xopenex nebs.  She may benefit from a dose of toradol or prednisone taper.     HYPERLIPIDEMIA-MIXED  Takes lipitor at home.   HYPERTENSION, BENIGN  BP well controlled.     COPD  Will give Xopenex nebs.     Tobacco abuse  Briefly discussed cessation  HAGER, BRYAN, PA-C 04/26/2014, 1:18 PM   Patient seen and examined with Tarri Fuller, PA-C. We discussed all aspects of the encounter. I agree with the assessment and plan as stated above. CP seems non-cardiac. Had  recent Myoview with no ischemia. Denies exertional angina at home. ECG with chronic LBBB. Troponin pending. If troponins not markedly elevated would not  pursue any further cardiac w/u. Will sign off pending troponin result.  Please call with questions.   Jermain Curt,MD 2:18 PM

## 2014-04-26 NOTE — Progress Notes (Signed)
   ENT Progress Note: s/p Procedure(s): WIDE LOCAL EXCISION OF NECK SKIN LESION/IMMEDIATE SUBCUTANEOUS TISSUE WITH LOCAL RECONSTRUCTION   Subjective: Surgical procedure performed without difficulty, no intraop cardiac or bp issues. In PACU the patient c/o chest pain and nausea.   Objective: Vital signs in last 24 hours: Temp:  [97.5 F (36.4 C)-97.6 F (36.4 C)] 97.6 F (36.4 C) (06/12 1109) Pulse Rate:  [60-78] 66 (06/12 1139) Resp:  [14-21] 17 (06/12 1139) BP: (123-159)/(54-88) 129/56 mmHg (06/12 1139) SpO2:  [94 %-99 %] 95 % (06/12 1139) Weight:  [87.544 kg (193 lb)] 87.544 kg (193 lb) (06/12 0810) Weight change:     Intake/Output from previous day:   Intake/Output this shift: Total I/O In: 500 [I.V.:500] Out: -   Labs: No results found for this basename: WBC, HGB, HCT, PLT,  in the last 72 hours No results found for this basename: NA, K, CL, CO2, GLUCOSE, BUN, CREATININR, CALCIUM,  in the last 72 hours  Studies/Results: No results found.   PHYSICAL EXAM: Incision stable No SOB   Assessment/Plan: Pt for planned for D/C, but with above findings plan Cardiology consult and hospital admission by Hospitalist Svc, Dr. Carles Collet for telemetry monitoring.    Dunning, Kamy Poinsett 04/26/2014, 11:52 AM

## 2014-04-26 NOTE — Discharge Instructions (Signed)

## 2014-04-26 NOTE — Progress Notes (Signed)
Dr shoemaker at bedside.

## 2014-04-26 NOTE — H&P (Signed)
History and Physical  Madison Henson HYI:[id number] DOB: 1928/03/16 DOA: 04/26/2014   PCP: Delphina Cahill, MD   Chief Complaint: Chest pain  HPI:  80 yo WF with history of COPD, diabetes mellitus, CAD, HTN, continued Tobacco abuse, Hyperlipidemia, LBBB, DM, GERD, anxiety/depression. 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 A new DES was placed at that time. The patient had a surgical excision of the neck skin lesion performed by Dr. Wilburn Cornelia today (04/26/14). When the patient woke up in PACU, she complained of chest pain that was substernal and sharp in nature. She stated it was worse with inspiration without any radiation to her arm, neck, back, or jaw. She did have some epigastric discomfort. The patient was given a SL NTG but did not help. She states that she has had some shortness of breath for a few days prior to today that was a little worse than usual. She continues to have a chronic cough without any worsening sputum production or hemoptysis. She denies any fever, chills, dizziness, headache, nausea, vomiting, diarrhea, abdominal pain, dysuria, hematuria, hematochezia, melena.. She continues to smoke 3-4 cigarettes per day. She denies any worsening lower extremity edema. Because of the patient's chest pain, admission was requested.  Cardiology was consulted and has seen the pt.  Patient has a history of coronary artery disease with DES, and she is on plavix.  Last cardiac cath 07/09/11 which showed mild to moderate stable CAD. She had a right lower lobectomy for stage IA non-small cell lung cancer per Dr. Arlyce Dice in October 2012.    Notably, the patient had a nuclear medicine stress test on 04/02/2014 that was interpreted to be Intermediate risk stress nuclear study with a mostly fixed anteroseptal and apical fixed defect, consistent with possible scar or rate-related LBBB artifact. EKG shows sinus rhythm with LBBB.  The  patient's vital signs are stable.    Assessment/Plan: Atypical chest pain -EKG essentially unchanged from previous EKGs -Cycle troponins -D-dimer -Chest x-ray -Suspect a degree of musculoskeletal etiology versus COPD vs GI etiology--pt has hx of esophageal stricture -Appreciate cardiology consultation -Continue Plavix COPD exacerbation -Patient has a mild degree of wheezing on examination and complains of slight worsening of shortness of breath even prior to the hospitalization -Start prednisone po -Aerosolized albuterol and atrovent -Pulmonary hygiene -pt is on 3L at night at home Tobacco abuse -Tobacco cessation discussed Hypertension  -Old amlodipine as the patient's blood pressure is soft Hyperlipidemia -Continue statin Diabetes mellitus type 2, controlled -Hold Januvia -NovoLog sliding scale -Repeat hemoglobin A1C CAD -continue plavix Depression -Continue Cymbalta and Xanax      Past Medical History  Diagnosis Date  . Coronary artery disease     post diaphragmatic wall infarction on 06-2007,due to stent thrombosis   . Chronic obstructive pulmonary disease   . Tobacco abuse   . Hypertension   . Hyperlipidemia   . GERD (gastroesophageal reflux disease)   . Anxiety disorder   . Depression   . Hypothyroidism   . Hx of hysterectomy   . AAA (abdominal aortic aneurysm)   . Pulmonary nodule 04/2011    neg bx, followed by Dr. Arlyce Dice, may still be cancer, being followed with CTs.  . Esophageal dysmotility     Two BPE since 03/2011-->Moderate impairment of esophageal motility.Vallecular residuals. Laryngeal penetration  . Anemia   . Diabetes mellitus     with gastroparesis 70% retention at 2 hours  .  Cancer   . Lung cancer   . CHF (congestive heart failure)   . Myocardial infarction   . AAA (abdominal aortic aneurysm) without rupture 09/25/2013   Past Surgical History  Procedure Laterality Date  . Cholecystectomy    . Cataract surg    . Bladder surgery       tack  . Kidney stone surgery    . Coronary stent placement  2004    2  . Coronary stent placement  2008    2  . S/p hysterectomy    . Neck surgery    . Esophagogastroduodenoscopy  5/08    normal esophagus, No H.Pylori  . Egd/bravo  07/2008    On Nexium BID. Day 1 DMSTR Score: 0.7, day 2: DMSTR score: 32, uncontrolled GERD, No H. Pylori  . Hbt  2009    negative  . Colonoscopy  05/2010    tortuous sigm colon, sigm tics, multiple simple adenomas, hemorrhoids, next TCS 10-15 yrs per Dr. Oneida Alar. Previous h/o tubular adenomas in 2008.   Marland Kitchen Esophagogastroduodenoscopy  05/2010    small hh, streaky erythema in body/antrum. Duodenal bx negative.   . Esophagogastroduodenoscopy  06/22/2011    mild gastritis/ring in the distal esophagus, savary dilation. Bx reactive gastropathy, No H.pylori  . Savory dilation  06/22/2011    Procedure: SAVORY DILATION;  Surgeon: Dorothyann Peng, MD;  [facility];  Service: Endoscopy;  Laterality: N/A;  [name] dilation  06/22/2011    Procedure: [name] DILATION;  Surgeon: Dorothyann Peng, MD;  [facility];  Service: Endoscopy;  Laterality: N/A;  . Video brochoscopy with electromagnetic navigaton  05/10/2011    [name]  . Right lower lobe superior segmentectomy  October 2012  . Hysterectomy at age 6    . Cardiac catheterization    . Abdominal hysterectomy    . Esophagogastroduodenoscopy N/A 08/14/2013    Procedure: ESOPHAGOGASTRODUODENOSCOPY (EGD);  Surgeon: Danie Binder, MD;  [facility];  Service: Endoscopy;  Laterality: N/A;  2:45  . Colonoscopy N/A 11/02/2013    Procedure: COLONOSCOPY;  Surgeon: Danie Binder, MD;  [facility];  Service: Endoscopy;  Laterality: N/A;  1:15  . Hemorrhoid banding N/A 11/02/2013    Procedure: HEMORRHOID BANDING;  Surgeon: Danie Binder, MD;  [facility];  Service: Endoscopy;  Laterality: N/A;  . Agile capsule N/A 11/12/2013    Procedure: AGILE CAPSULE;  Surgeon:  Danie Binder, MD;  [facility];  Service: Endoscopy;  Laterality: N/A;  8:00  . Bacterial overgrowth test N/A 11/16/2013    Procedure: BACTERIAL OVERGROWTH TEST;  Surgeon: Danie Binder, MD;  [facility];  Service: Endoscopy;  Laterality: N/A;  8:00   Social History:  reports that she has been smoking Cigarettes.  She has a 20 pack-year smoking history. She has never used smokeless tobacco. She reports that she does not drink alcohol or use illicit drugs.   Family History  Problem Relation Age of Onset  . Coronary artery disease      family hx of  . Colon cancer Sister 57  . Colon cancer Daughter   . Prostate cancer      family hx of  . Lung cancer      family hx of  . Ovarian cancer      family hx of  . Arthritis      family hx of  . Other  family hx of chronic respiratory condition  . Heart disease Mother   . Stroke Mother      Allergies  Allergen Reactions  . Linzess [Linaclotide] Other (See Comments)    EXPLOSIVE DIARRHEA  . Oxycodone Other (See Comments)    Causes hyperactivity   . Sulfonamide Derivatives Swelling and Rash    Hands & feet swell  . Hydrocodone Other (See Comments)    Causes Hyperactivity       Prior to Admission medications   Medication Sig Start Date End Date Taking? Authorizing Provider  albuterol (PROAIR HFA) 108 (90 BASE) MCG/ACT inhaler Inhale 2 puffs into the lungs every 6 (six) hours as needed. For wheezing   Yes Historical Provider, MD  ALPRAZolam Duanne Moron) 1 MG tablet Take 0.5-1 mg by mouth 2 (two) times daily. Take 0.5 mg in am and 1 mg in pm   Yes Historical Provider, MD  amLODipine (NORVASC) 5 MG tablet Take 5 mg by mouth daily.   Yes Historical Provider, MD  atorvastatin (LIPITOR) 20 MG tablet Take 20 mg by mouth at bedtime.    Yes Historical Provider, MD  clopidogrel (PLAVIX) 75 MG tablet Take 75 mg by mouth daily with breakfast.   Yes Historical Provider, MD  cyanocobalamin (,VITAMIN B-12,) 1000 MCG/ML  injection Inject 1,000 mcg into the muscle every 30 (thirty) days.   Yes Historical Provider, MD  docusate sodium (COLACE) 100 MG capsule Take 100 mg by mouth daily as needed for mild constipation.   Yes Historical Provider, MD  gabapentin (NEURONTIN) 300 MG capsule Take 300-600 mg by mouth 2 (two) times daily. Take 300mg  in the morning & 600mg  at bedtime 07/24/12  Yes Historical Provider, MD  HYDROcodone-acetaminophen (LORTAB) 10-500 MG per tablet Take 1 tablet by mouth every 6 (six) hours as needed for pain.   Yes Historical Provider, MD  hydrocortisone (ANUSOL-HC) 2.5 % rectal cream Place 1 application rectally 4 (four) times daily.   Yes Historical Provider, MD  JANUVIA 50 MG tablet Take 50 mg by mouth daily.  07/04/12  Yes Historical Provider, MD  levothyroxine (SYNTHROID, LEVOTHROID) 125 MCG tablet Take 125 mcg by mouth daily before breakfast.   Yes Historical Provider, MD  nitroGLYCERIN (NITROSTAT) 0.4 MG SL tablet Place 0.4 mg under the tongue every 5 (five) minutes as needed for chest pain.   Yes Historical Provider, MD  pantoprazole (PROTONIX) 40 MG tablet Take 40 mg by mouth 2 (two) times daily.    Yes Historical Provider, MD  polyethylene glycol (MIRALAX / GLYCOLAX) packet Take 17 g by mouth daily. 12/26/13  Yes Jasper Riling. Pickering, MD  Vitamin D, Ergocalciferol, (DRISDOL) 50000 UNITS CAPS capsule Take 50,000 Units by mouth every 7 (seven) days.   Yes Historical Provider, MD  DULoxetine (CYMBALTA) 60 MG capsule Take 60 mg by mouth daily.    Historical Provider, MD    Review of Systems:  Constitutional:  No weight loss, night sweats, Fevers, chills, fatigue.  Head&Eyes: No headache.  No vision loss.  No eye pain or scotoma ENT:  No Difficulty swallowing,Tooth/dental problems,Sore throat,   Cardio-vascular:  No chest pain, Orthopnea, PND,  dizziness, palpitations  GI:  No  abdominal pain, nausea, vomiting, diarrhea, loss of appetite, hematochezia, melena, heartburn, indigestion, Resp:   No shortness of breath with exertion or at rest.  No coughing up of blood Skin:  no rash or lesions.  GU:  no dysuria, change in color of urine, no urgency or frequency. No flank pain.  Musculoskeletal:  No joint pain or swelling. No decreased range of motion. No back pain.  Psych:  No change in mood or affect.  Neurologic: No headache, no dysesthesia, no focal weakness, no vision loss. No syncope  Physical Exam: Filed Vitals:   04/26/14 1245 04/26/14 1250 04/26/14 1300 04/26/14 1313  BP:    119/61  Pulse: 65 64 63 66  Temp:  97.6 F (36.4 C)  97.8 F (36.6 C)  TempSrc:    Oral  Resp: 16 18  18   Height:    5\' 9"  (1.753 m)  Weight:    96 kg (211 lb 10.3 oz)  SpO2: 95% 96% 95% 98%   General:  A&O x 3, NAD, nontoxic, pleasant/cooperative Head/Eye: No conjunctival hemorrhage, no icterus, [place]/AT, No nystagmus ENT:  No icterus,  No thrush,  no pharyngeal exudate Neck:  No masses, no lymphadenpathy, no bruits CV:  RRR, no rub, no gallop, no S3 Lung:  Mild expiratory wheeze with bibasilar crackles. Diminished breath sounds bilateral. Abdomen: soft/NT, +BS, nondistended, no peritoneal signs Ext: No cyanosis, No rashes, No petechiae, No lymphangitis, trace LE edema   Labs on Admission:  Basic Metabolic Panel: No results found for this basename: NA, K, CL, CO2, GLUCOSE, BUN, CREATININE, CALCIUM, MG, PHOS,  in the last 168 hours Liver Function Tests: No results found for this basename: AST, ALT, ALKPHOS, BILITOT, PROT, ALBUMIN,  in the last 168 hours No results found for this basename: LIPASE, AMYLASE,  in the last 168 hours No results found for this basename: AMMONIA,  in the last 168 hours CBC: No results found for this basename: WBC, NEUTROABS, HGB, HCT, MCV, PLT,  in the last 168 hours Cardiac Enzymes: No results found for this basename: CKTOTAL, CKMB, CKMBINDEX, TROPONINI,  in the last 168 hours BNP: No components found with this basename: POCBNP,  CBG:  Recent Labs Lab  04/26/14 0802 04/26/14 1120  GLUCAP 91 99    Radiological Exams on Admission: No results found.  EKG: Independently reviewed. Sinus with LBBB    Time spent:60 minutes Code Status:   FULL Family Communication:   No Family at bedside   Ghada Abbett, DO  [facility] Pager [phone]  If 7PM-7AM, please contact night-coverage [email or web address] Password [facility] 04/26/2014, 2:47 PM

## 2014-04-26 NOTE — Anesthesia Preprocedure Evaluation (Signed)
Anesthesia Evaluation  Patient identified by MRN, date of birth, ID band Patient awake    Reviewed: Allergy & Precautions, H&P , NPO status , Patient's Chart, lab work & pertinent test results  History of Anesthesia Complications Negative for: history of anesthetic complications  Airway Mallampati: II TM Distance: >3 FB Neck ROM: Full    Dental  (+) Edentulous Upper, Edentulous Lower   Pulmonary neg sleep apnea, COPD COPD inhaler, neg recent URI, Current Smoker,  breath sounds clear to auscultation        Cardiovascular hypertension, Pt. on medications and Pt. on home beta blockers - angina+ CAD, + Past MI, + Cardiac Stents, + Peripheral Vascular Disease, +CHF and + DOE - CABG - dysrhythmias - Valvular Problems/MurmursRhythm:Regular  Ef 50%, neg stress   Neuro/Psych PSYCHIATRIC DISORDERS Depression  Neuromuscular disease    GI/Hepatic Neg liver ROS, GERD-  Medicated and Controlled,Esophageal stricture   Endo/Other  diabetes, Type 2Hypothyroidism   Renal/GU Renal InsufficiencyRenal disease     Musculoskeletal   Abdominal   Peds  Hematology  (+) anemia ,   Anesthesia Other Findings   Reproductive/Obstetrics                           Anesthesia Physical Anesthesia Plan  ASA: III  Anesthesia Plan: General   Post-op Pain Management:    Induction: Intravenous  Airway Management Planned: Oral ETT  Additional Equipment: None  Intra-op Plan:   Post-operative Plan: Extubation in OR  Informed Consent: I have reviewed the patients History and Physical, chart, labs and discussed the procedure including the risks, benefits and alternatives for the proposed anesthesia with the patient or authorized representative who has indicated his/her understanding and acceptance.     Plan Discussed with: CRNA and Surgeon  Anesthesia Plan Comments:         Anesthesia Quick Evaluation

## 2014-04-27 ENCOUNTER — Inpatient Hospital Stay (HOSPITAL_COMMUNITY): Payer: Medicare Other

## 2014-04-27 DIAGNOSIS — J189 Pneumonia, unspecified organism: Secondary | ICD-10-CM | POA: Diagnosis present

## 2014-04-27 DIAGNOSIS — L989 Disorder of the skin and subcutaneous tissue, unspecified: Secondary | ICD-10-CM

## 2014-04-27 LAB — STREP PNEUMONIAE URINARY ANTIGEN: Strep Pneumo Urinary Antigen: NEGATIVE

## 2014-04-27 LAB — TROPONIN I: Troponin I: <0.3 ng/mL

## 2014-04-27 LAB — GLUCOSE, CAPILLARY
Glucose-Capillary: 129 mg/dL — ABNORMAL HIGH (ref 70–99)
Glucose-Capillary: 155 mg/dL — ABNORMAL HIGH (ref 70–99)
Glucose-Capillary: 152 mg/dL — ABNORMAL HIGH (ref 70–99)
Glucose-Capillary: 162 mg/dL — ABNORMAL HIGH (ref 70–99)

## 2014-04-27 MED ORDER — IPRATROPIUM-ALBUTEROL 0.5-2.5 (3) MG/3ML IN SOLN: 3.0000 mL | Freq: Three times a day (TID) | RESPIRATORY_TRACT | Status: DC

## 2014-04-27 MED ORDER — IPRATROPIUM-ALBUTEROL 0.5-2.5 (3) MG/3ML IN SOLN
3.0000 mL | Freq: Four times a day (QID) | RESPIRATORY_TRACT | Status: DC
  Administered 2014-04-27 – 2014-04-30 (×10): 3 mL via RESPIRATORY_TRACT
  Filled 2014-04-27 (×11): qty 3

## 2014-04-27 MED ORDER — IOHEXOL 350 MG/ML SOLN
80.0000 mL | Freq: Once | INTRAVENOUS | Status: AC | PRN
  Administered 2014-04-27: 80 mL via INTRAVENOUS

## 2014-04-27 MED ORDER — DEXTROSE 5 % IV SOLN
1.0000 g | Freq: Every day | INTRAVENOUS | Status: DC
  Administered 2014-04-27: 1 g via INTRAVENOUS
  Filled 2014-04-27 (×2): qty 10

## 2014-04-27 MED ORDER — METHYLPREDNISOLONE SODIUM SUCC 125 MG IJ SOLR
60.0000 mg | Freq: Four times a day (QID) | INTRAMUSCULAR | Status: DC
  Administered 2014-04-27 – 2014-04-29 (×8): 60 mg via INTRAVENOUS
  Filled 2014-04-27 (×12): qty 0.96
  Filled 2014-04-27: qty 2

## 2014-04-27 MED ORDER — DEXTROSE 5 % IV SOLN
500.0000 mg | Freq: Every day | INTRAVENOUS | Status: DC
  Administered 2014-04-27: 500 mg via INTRAVENOUS
  Filled 2014-04-27 (×2): qty 500

## 2014-04-27 NOTE — Progress Notes (Addendum)
PROGRESS NOTE  Madison Henson LYY:[id number] DOB: June 23, 1928 DOA: 04/26/2014 PCP: Delphina Cahill, MD  Brief History 80 yo female with history of COPD, diabetes mellitus, CAD, HTN, continued Tobacco abuse, Hyperlipidemia, LBBB, DM, GERD, anxiety/depression. 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 A new DES was placed at that time. The patient had a surgical excision of the neck skin lesion performed by Dr. Wilburn Cornelia today (04/26/14). When the patient woke up in PACU, she complained of chest pain that was substernal and sharp in nature. She stated it was worse with inspiration without any radiation to her arm, neck, back, or jaw. She states that she has had some shortness of breath for a few days prior to today that was a little worse than usual. She continues to smoke 3-4 cigarettes per day. She denies any worsening lower extremity edema. Because of the patient's chest pain, admission was requested. Cardiology was consulted and has seen the pt. Patient has a history of coronary artery disease with DES, and she is on plavix. Last cardiac cath 07/09/11 which showed mild to moderate stable CAD. She had a right lower lobectomy for stage IA non-small cell lung cancer per Dr. Arlyce Dice in October 2012. Cardiology did not recommend any further workup after the patient's troponins were negative x3. Notably, the patient had a nuclear medicine stress test on 04/02/2014 that was interpreted to be Intermediate risk stress nuclear study with a mostly fixed anteroseptal and apical fixed defect, consistent with possible scar or rate-related LBBB artifact. EKG shows sinus rhythm with LBBB. D-dimer was elevated. As a result, CT angiogram of the chest was obtained which was negative for pulmonary embolus. Chest x-ray revealed possible right lower lobe opacity/infiltrate. As a result, the patient was started on intravenous antibiotics. The patient had diffuse  wheezing, and in combination with the clinical history, the patient was treated for a COPD exacerbation.   Assessment/Plan: Atypical chest pain  -EKG essentially unchanged from previous EKGs  -Cycle troponins--negative  -D-dimer--0.68 -Chest x-ray-- right lower lobe infiltrate  -Appreciate cardiology consultation  -Continue Plavix  -CTA chest CAP -Start ceftriaxone and azithromycin -Urine Legionella antigen -Urine Streptococcus pneumoniae antigen COPD exacerbation  -Patient has a mild degree of wheezing on examination and complains of slight worsening of shortness of breath even prior to the hospitalization  -Continue prednisone po  -Aerosolized albuterol and atrovent  -Pulmonary hygiene  -pt is on 3L at night at home  Tobacco abuse  -Tobacco cessation discussed  Hypertension  -Hold amlodipine as the patient's blood pressure is soft  Hyperlipidemia  -Continue statin  Diabetes mellitus type 2, controlled  -Hold Januvia  -NovoLog sliding scale  -Repeat hemoglobin A1C  CAD  -continue plavix  Depression  -Continue Cymbalta and Xanax    Family Communication:   Daughter updated on the telephone Disposition Plan:   Home when medically stable  Antibiotics:  Azithromycin 04/27/2014 Ceftriaxone 04/27/2014   Procedures/Studies: Dg Chest 2 View  04/26/2014   CLINICAL DATA:  Chest pain.  Status post neck surgery.  EXAM: CHEST  2 VIEW  COMPARISON:  Two-view chest 04/19/2014.  FINDINGS: The heart size is exaggerated by low lung volumes. Emphysematous changes are again noted. New right lower lobe airspace disease is concerning for early bronchopneumonia or aspiration. Biapical scarring is stable. The visualized soft tissues and bony thorax are otherwise unremarkable.  IMPRESSION: 1. New right lower lobe airspace disease  is concerning for bronchopneumonia or aspiration. 2. Emphysema.   Electronically Signed   By: Lawrence Santiago M.D.   On: 04/26/2014 18:00   Dg Chest 2  View  04/19/2014   CLINICAL DATA:  Coronary disease, preop evaluation for left neck surgery  EXAM: CHEST  2 VIEW  COMPARISON:  09/26/2013, 12/05/2013  FINDINGS: Stable postop changes in the right hilum and lower lobe from prior partial lung resection. Chronic right hilar and basilar scarring. No superimposed edema, pneumonia, collapse or consolidation. No effusion or pneumothorax. Trachea midline. Normal heart size and vascularity. Atherosclerosis of the aorta. Mild hyperinflation noted.  IMPRESSION: Stable postop changes with volume loss and scarring in the right hemi thorax.  No superimposed acute process   Electronically Signed   By: Daryll Brod M.D.   On: 04/19/2014 12:52         Subjective:  Patient still complains of some shortness of breath with exertion. Chest discomfort has improved but is still intermittently pleuritic in nature. Denies any dizziness, nausea, vomiting, diarrhea, belly pain, orthopnea. Complains of a cough without any hemoptysis  Objective: Filed Vitals:   04/26/14 1300 04/26/14 1313 04/26/14 2214 04/27/14 0502  BP:  119/61 117/48 114/48  Pulse: 63 66 69 68  Temp:  97.8 F (36.6 C) 97.5 F (36.4 C) 97.7 F (36.5 C)  TempSrc:  Oral Oral Oral  Resp:  18 15 12   Height:  5\' 9"  (1.753 m)    Weight:  96 kg (211 lb 10.3 oz)    SpO2: 95% 98% 93% 98%    Intake/Output Summary (Last 24 hours) at 04/27/14 0809 Last data filed at 04/26/14 2100  Gross per 24 hour  Intake   1010 ml  Output    600 ml  Net    410 ml   Weight change:  Exam:   General:  Pt is alert, follows commands appropriately, not in acute distress  HEENT: No icterus, No thrush,  [place]/AT  Cardiovascular: RRR, S1/S2, no rubs, no gallops  Respiratory: bilateral scattered rhonchi with expiratory wheeze.  Abdomen: Soft/+BS, non tender, non distended, no guarding  Extremities: trade LE edema, No lymphangitis, No petechiae, No rashes, no synovitis  Data Reviewed: Basic Metabolic  Panel:  Recent Labs Lab 04/26/14 1635  NA 144  K 4.0  CL 105  CO2 25  GLUCOSE 146*  BUN 10  CREATININE 0.86  CALCIUM 8.9   Liver Function Tests: No results found for this basename: AST, ALT, ALKPHOS, BILITOT, PROT, ALBUMIN,  in the last 168 hours No results found for this basename: LIPASE, AMYLASE,  in the last 168 hours No results found for this basename: AMMONIA,  in the last 168 hours CBC:  Recent Labs Lab 04/26/14 1635  WBC 8.9  HGB 12.8  HCT 38.9  MCV 92.8  PLT 208   Cardiac Enzymes:  Recent Labs Lab 04/26/14 1635 04/26/14 2205 04/27/14 0335  TROPONINI <0.30 <0.30 <0.30   BNP: No components found with this basename: POCBNP,  CBG:  Recent Labs Lab 04/26/14 0802 04/26/14 1120 04/26/14 1705 04/26/14 2217 04/27/14 0733  GLUCAP 91 99 137* 196* 129*    No results found for this or any previous visit (from the past 240 hour(s)).   Scheduled Meds: . ALPRAZolam  0.5-1 mg Oral BID  . atorvastatin  20 mg Oral QHS  . azithromycin  500 mg Intravenous Daily  . cefTRIAXone (ROCEPHIN)  IV  1 g Intravenous Daily  . clopidogrel  75 mg Oral Q breakfast  .  DULoxetine  60 mg Oral Daily  . enoxaparin (LOVENOX) injection  40 mg Subcutaneous Q24H  . gabapentin  300-600 mg Oral BID  . insulin aspart  0-9 Units Subcutaneous TID WC  . ipratropium-albuterol  3 mL Nebulization Q6H  . levalbuterol  0.63 mg Nebulization Once  . levothyroxine  125 mcg Oral QAC breakfast  . pantoprazole  40 mg Oral BID  . polyethylene glycol  17 g Oral Daily  . predniSONE  60 mg Oral Q breakfast  . sodium chloride  3 mL Intravenous Q12H   Continuous Infusions:    Hanz Winterhalter, DO  [facility] Pager [phone]  If 7PM-7AM, please contact night-coverage [email or web address] Password [email or web address] 04/27/2014, 8:09 AM   LOS: 1 day

## 2014-04-27 NOTE — Progress Notes (Signed)
Neck looks and feels good.  Could go home whenever ready from other health problems standpoint.  Routine wound hygiene.  Recheck for suture removal 10 days post op.  Jodi Marble MD

## 2014-04-27 NOTE — Progress Notes (Signed)
Several episodes of tachy earlier, MD aware, EKG done and shown to MD at 1400, no new orders.

## 2014-04-28 LAB — LEGIONELLA ANTIGEN, URINE: Legionella Antigen, Urine: NEGATIVE

## 2014-04-28 LAB — GLUCOSE, CAPILLARY
GLUCOSE-CAPILLARY: 158 mg/dL — AB (ref 70–99)
GLUCOSE-CAPILLARY: 144 mg/dL — AB (ref 70–99)
Glucose-Capillary: 158 mg/dL — ABNORMAL HIGH (ref 70–99)
Glucose-Capillary: 146 mg/dL — ABNORMAL HIGH (ref 70–99)

## 2014-04-28 LAB — CBC
HEMATOCRIT: 36.1 % (ref 36.0–46.0)
Hemoglobin: 11.7 g/dL — ABNORMAL LOW (ref 12.0–15.0)
MCH: 30 pg (ref 26.0–34.0)
MCHC: 32.4 g/dL (ref 30.0–36.0)
MCV: 92.6 fL (ref 78.0–100.0)
Platelets: 211 10*3/uL (ref 150–400)
RBC: 3.9 MIL/uL (ref 3.87–5.11)
RDW: 12.8 % (ref 11.5–15.5)
WBC: 14.6 10*3/uL — ABNORMAL HIGH (ref 4.0–10.5)

## 2014-04-28 LAB — BASIC METABOLIC PANEL
BUN: 18 mg/dL (ref 6–23)
CHLORIDE: 101 meq/L (ref 96–112)
CO2: 25 mEq/L (ref 19–32)
Calcium: 9.2 mg/dL (ref 8.4–10.5)
Creatinine, Ser: 0.88 mg/dL (ref 0.50–1.10)
GFR calc Af Amer: 69 mL/min — ABNORMAL LOW (ref >90)
GFR calc non Af Amer: 59 mL/min — ABNORMAL LOW (ref >90)
GLUCOSE: 177 mg/dL — AB (ref 70–99)
Potassium: 4.7 mEq/L (ref 3.7–5.3)
Sodium: 141 mEq/L (ref 137–147)

## 2014-04-28 MED ORDER — DEXTROSE 5 % IV SOLN
1.0000 g | INTRAVENOUS | Status: DC
  Administered 2014-04-28 – 2014-04-29 (×2): 1 g via INTRAVENOUS
  Filled 2014-04-28 (×2): qty 10

## 2014-04-28 MED ORDER — AZITHROMYCIN 500 MG IV SOLR
500.0000 mg | INTRAVENOUS | Status: DC
  Administered 2014-04-28: 500 mg via INTRAVENOUS
  Filled 2014-04-28 (×2): qty 500

## 2014-04-28 NOTE — Progress Notes (Signed)
PROGRESS NOTE  Madison Henson WGY:[id number] DOB: 1928/11/09 DOA: 04/26/2014 PCP: Delphina Cahill, MD   Brief History  80 yo female with history of COPD, diabetes mellitus, CAD, HTN, continued Tobacco abuse, Hyperlipidemia, LBBB, DM, GERD, anxiety/depression. 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 A new DES was placed at that time. The patient had a surgical excision of the neck skin lesion performed by Dr. Wilburn Cornelia today (04/26/14). When the patient woke up in PACU, she complained of chest pain that was substernal and sharp in nature. She stated it was worse with inspiration without any radiation to her arm, neck, back, or jaw. She states that she has had some shortness of breath for a few days prior to today that was a little worse than usual. She continues to smoke 3-4 cigarettes per day. She denies any worsening lower extremity edema. Because of the patient's chest pain, admission was requested. Cardiology was consulted and has seen the pt. Patient has a history of coronary artery disease with DES, and she is on plavix. Last cardiac cath 07/09/11 which showed mild to moderate stable CAD. She had a right lower lobectomy for stage IA non-small cell lung cancer per Dr. Arlyce Dice in October 2012. Cardiology did not recommend any further workup after the patient's troponins were negative x3.  Notably, the patient had a nuclear medicine stress test on 04/02/2014 that was interpreted to be Intermediate risk stress nuclear study with a mostly fixed anteroseptal and apical fixed defect, consistent with possible scar or rate-related LBBB artifact. EKG shows sinus rhythm with LBBB. D-dimer was elevated. As a result, CT angiogram of the chest was obtained which was negative for pulmonary embolus. Chest x-ray revealed possible right lower lobe opacity/infiltrate. As a result, the patient was started on intravenous antibiotics. The patient had  diffuse wheezing, and in combination with the clinical history, the patient was treated for a COPD exacerbation.  Assessment/Plan:  Atypical chest pain  -EKG essentially unchanged from previous EKGs  -Cycle troponins--negative  -D-dimer--0.68  -Chest x-ray-- right lower lobe infiltrate  -Appreciate cardiology consultation  -Continue Plavix  -CTA chest--neg for PE CAP  -Continue ceftriaxone and azithromycin  -Urine Legionella antigen--neg  -Urine Streptococcus pneumoniae antigen--neg  COPD exacerbation  -Patient has a mild degree of wheezing on examination and complains of slight worsening of shortness of breath even prior to the hospitalization  -continue IV solumedrol -Aerosolized albuterol and atrovent  -Pulmonary hygiene  -pt is on 3L at night at home  Tobacco abuse  -Tobacco cessation discussed  Hypertension  -Hold amlodipine as the patient's blood pressure was soft-->BP remains stable off of amlodipine Hyperlipidemia  -Continue statin  Diabetes mellitus type 2, controlled  -Hold Januvia  -NovoLog sliding scale while pt is on steroids -Repeat hemoglobin A1C--6.1  CAD  -continue plavix  Depression  -Continue Cymbalta and Xanax  Family Communication: Daughter updated at bedside Disposition Plan: Home when medically stable  Antibiotics:  Azithromycin 04/27/2014>>> Ceftriaxone 04/27/2014>>>    Procedures/Studies: Dg Chest 2 View  04/26/2014   CLINICAL DATA:  Chest pain.  Status post neck surgery.  EXAM: CHEST  2 VIEW  COMPARISON:  Two-view chest 04/19/2014.  FINDINGS: The heart size is exaggerated by low lung volumes. Emphysematous changes are again noted. New right lower lobe airspace disease is concerning for early bronchopneumonia or aspiration. Biapical scarring is stable. The visualized soft tissues and bony thorax are  otherwise unremarkable.  IMPRESSION: 1. New right lower lobe airspace disease is concerning for bronchopneumonia or aspiration. 2. Emphysema.    Electronically Signed   By: Lawrence Santiago M.D.   On: 04/26/2014 18:00   Dg Chest 2 View  04/19/2014   CLINICAL DATA:  Coronary disease, preop evaluation for left neck surgery  EXAM: CHEST  2 VIEW  COMPARISON:  09/26/2013, 12/05/2013  FINDINGS: Stable postop changes in the right hilum and lower lobe from prior partial lung resection. Chronic right hilar and basilar scarring. No superimposed edema, pneumonia, collapse or consolidation. No effusion or pneumothorax. Trachea midline. Normal heart size and vascularity. Atherosclerosis of the aorta. Mild hyperinflation noted.  IMPRESSION: Stable postop changes with volume loss and scarring in the right hemi thorax.  No superimposed acute process   Electronically Signed   By: Daryll Brod M.D.   On: 04/19/2014 12:52   Ct Angio Chest Pe W/cm &/or Wo Cm  04/27/2014   CLINICAL DATA:  Chest pain, shortness of breath, elevated D-dimer.  EXAM: CT ANGIOGRAPHY CHEST WITH CONTRAST  TECHNIQUE: Multidetector CT imaging of the chest was performed using the standard protocol during bolus administration of intravenous contrast. Multiplanar CT image reconstructions and MIPs were obtained to evaluate the vascular anatomy.  CONTRAST:  31mL OMNIPAQUE IOHEXOL 350 MG/ML SOLN  COMPARISON:  09/26/2013  FINDINGS: THORACIC INLET/BODY WALL:  No acute abnormality.  MEDIASTINUM:  Mild cardiomegaly. Diffuse atherosclerosis, including the coronary arteries. There is diffuse calcification of both the left and right coronary circulation. Mild lipomatous change of the interatrial septum. Negative for pulmonary arterial filling defect. There is exclusion of the posterior costophrenic sulci, which does not exclude any pulmonary arteries within the study resolution. No acute systemic arterial finding. Negative esophagus. Stable volume sub- carina lymph nodes.  LUNG WINDOWS:  Postsurgical volume loss and chronic reticular densities in the right lower lobe. There is mild atelectasis in this region, but  no increasing masslike abnormality to suggest recurrent disease. No consolidation, edema, effusion, or pneumothorax.  UPPER ABDOMEN:  No acute findings.  OSSEOUS:  No acute fracture.  No suspicious lytic or blastic lesions.  Review of the MIP images confirms the above findings.  IMPRESSION: Negative for pulmonary embolism or other acute intrathoracic abnormality.   Electronically Signed   By: Jorje Guild M.D.   On: 04/27/2014 10:51         Subjective: Overall patient is breathing better. She still having some shortness of breath with exertion. Denies fevers, chills, chest pain, nausea, vomiting, diarrhea, abdominal pain, dysuria.  Objective: Filed Vitals:   04/27/14 2205 04/28/14 0440 04/28/14 0808 04/28/14 1211  BP: 109/51 120/42  137/58  Pulse: 72 82  67  Temp: 98.4 F (36.9 C) 98.2 F (36.8 C)  98 F (36.7 C)  TempSrc: Oral Oral  Oral  Resp: 18 18  19   Height:      Weight:      SpO2: 94% 93% 93% 94%    Intake/Output Summary (Last 24 hours) at 04/28/14 1345 Last data filed at 04/27/14 1551  Gross per 24 hour  Intake    370 ml  Output    150 ml  Net    220 ml   Weight change:  Exam:   General:  Pt is alert, follows commands appropriately, not in acute distress  HEENT: No icterus, No thrush, No neck mass, [place]/AT  Cardiovascular: RRR, S1/S2, no rubs, no gallops  Respiratory: Bibasilar wheezing. Good air movement; bilateral scattered rales  Abdomen:  Soft/+BS, non tender, non distended, no guarding  Extremities: trace LE edema, No lymphangitis, No petechiae, No rashes, no synovitis  Data Reviewed: Basic Metabolic Panel:  Recent Labs Lab 04/26/14 1635 04/28/14 0410  NA 144 141  K 4.0 4.7  CL 105 101  CO2 25 25  GLUCOSE 146* 177*  BUN 10 18  CREATININE 0.86 0.88  CALCIUM 8.9 9.2   Liver Function Tests: No results found for this basename: AST, ALT, ALKPHOS, BILITOT, PROT, ALBUMIN,  in the last 168 hours No results found for this basename: LIPASE,  AMYLASE,  in the last 168 hours No results found for this basename: AMMONIA,  in the last 168 hours CBC:  Recent Labs Lab 04/26/14 1635 04/28/14 0410  WBC 8.9 14.6*  HGB 12.8 11.7*  HCT 38.9 36.1  MCV 92.8 92.6  PLT 208 211   Cardiac Enzymes:  Recent Labs Lab 04/26/14 1635 04/26/14 2205 04/27/14 0335  TROPONINI <0.30 <0.30 <0.30   BNP: No components found with this basename: POCBNP,  CBG:  Recent Labs Lab 04/27/14 1154 04/27/14 1652 04/27/14 2207 04/28/14 0725 04/28/14 1139  GLUCAP 155* 152* 162* 158* 146*    No results found for this or any previous visit (from the past 240 hour(s)).   Scheduled Meds: . ALPRAZolam  0.5-1 mg Oral BID  . atorvastatin  20 mg Oral QHS  . azithromycin  500 mg Intravenous Daily  . cefTRIAXone (ROCEPHIN)  IV  1 g Intravenous Daily  . clopidogrel  75 mg Oral Q breakfast  . DULoxetine  60 mg Oral Daily  . enoxaparin (LOVENOX) injection  40 mg Subcutaneous Q24H  . gabapentin  300-600 mg Oral BID  . insulin aspart  0-9 Units Subcutaneous TID WC  . ipratropium-albuterol  3 mL Nebulization Q6H  . levothyroxine  125 mcg Oral QAC breakfast  . methylPREDNISolone (SOLU-MEDROL) injection  60 mg Intravenous Q6H  . pantoprazole  40 mg Oral BID  . polyethylene glycol  17 g Oral Daily  . sodium chloride  3 mL Intravenous Q12H   Continuous Infusions:    Manish Ruggiero, DO  [facility] Pager [phone]  If 7PM-7AM, please contact night-coverage [email or web address] Password [email or web address] 04/28/2014, 1:45 PM   LOS: 2 days

## 2014-04-29 ENCOUNTER — Encounter (HOSPITAL_COMMUNITY): Payer: Self-pay | Admitting: Otolaryngology

## 2014-04-29 LAB — BASIC METABOLIC PANEL
BUN: 28 mg/dL — ABNORMAL HIGH (ref 6–23)
CALCIUM: 8.9 mg/dL (ref 8.4–10.5)
CO2: 27 meq/L (ref 19–32)
Chloride: 102 mEq/L (ref 96–112)
Creatinine, Ser: 0.84 mg/dL (ref 0.50–1.10)
GFR calc Af Amer: 72 mL/min — ABNORMAL LOW (ref >90)
GFR calc non Af Amer: 63 mL/min — ABNORMAL LOW (ref >90)
Glucose, Bld: 169 mg/dL — ABNORMAL HIGH (ref 70–99)
Potassium: 4.1 mEq/L (ref 3.7–5.3)
SODIUM: 142 meq/L (ref 137–147)

## 2014-04-29 LAB — GLUCOSE, CAPILLARY
GLUCOSE-CAPILLARY: 141 mg/dL — AB (ref 70–99)
Glucose-Capillary: 159 mg/dL — ABNORMAL HIGH (ref 70–99)
Glucose-Capillary: 157 mg/dL — ABNORMAL HIGH (ref 70–99)
Glucose-Capillary: 148 mg/dL — ABNORMAL HIGH (ref 70–99)

## 2014-04-29 MED ORDER — AZITHROMYCIN 500 MG PO TABS: 500.0000 mg | ORAL_TABLET | Freq: Every day | ORAL | Status: DC

## 2014-04-29 MED ORDER — AZITHROMYCIN 500 MG PO TABS
500.0000 mg | ORAL_TABLET | Freq: Every day | ORAL | Status: DC
  Administered 2014-04-29 – 2014-05-01 (×3): 500 mg via ORAL
  Filled 2014-04-29: qty 2
  Filled 2014-04-29 (×4): qty 1

## 2014-04-29 MED ORDER — CEFPODOXIME PROXETIL 200 MG PO TABS
200.0000 mg | ORAL_TABLET | Freq: Two times a day (BID) | ORAL | Status: DC
  Administered 2014-04-30 – 2014-05-02 (×5): 200 mg via ORAL
  Filled 2014-04-29 (×6): qty 1

## 2014-04-29 MED ORDER — ALPRAZOLAM 0.5 MG PO TABS
0.5000 mg | ORAL_TABLET | Freq: Every day | ORAL | Status: DC
  Administered 2014-04-29 – 2014-05-02 (×4): 0.5 mg via ORAL
  Filled 2014-04-29 (×4): qty 1

## 2014-04-29 MED ORDER — ALPRAZOLAM 0.5 MG PO TABS
1.0000 mg | ORAL_TABLET | Freq: Every day | ORAL | Status: DC
  Administered 2014-04-29 – 2014-05-01 (×3): 1 mg via ORAL
  Filled 2014-04-29 (×3): qty 2

## 2014-04-29 MED ORDER — METHYLPREDNISOLONE SODIUM SUCC 125 MG IJ SOLR
60.0000 mg | Freq: Two times a day (BID) | INTRAMUSCULAR | Status: DC
  Administered 2014-04-29 – 2014-04-30 (×2): 60 mg via INTRAVENOUS
  Filled 2014-04-29: qty 0.96
  Filled 2014-04-29: qty 2
  Filled 2014-04-29 (×3): qty 0.96
  Filled 2014-04-29: qty 2

## 2014-04-29 MED ORDER — PREDNISONE 10 MG PO TABS: 60.0000 mg | ORAL_TABLET | Freq: Every day | ORAL | Status: DC

## 2014-04-29 NOTE — Progress Notes (Signed)
PROGRESS NOTE  Madison Henson KWI:[id number] DOB: Aug 22, 1928 DOA: 04/26/2014 PCP: Delphina Cahill, MD  Brief History  80 yo female with history of COPD, diabetes mellitus, CAD, HTN, continued Tobacco abuse, Hyperlipidemia, LBBB, DM, GERD, anxiety/depression. 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 A new DES was placed at that time. The patient had a surgical excision of the neck skin lesion performed by Dr. Wilburn Cornelia today (04/26/14). When the patient woke up in PACU, she complained of chest pain that was substernal and sharp in nature. She stated it was worse with inspiration without any radiation to her arm, neck, back, or jaw. She states that she has had some shortness of breath for a few days prior to today that was a little worse than usual. She continues to smoke 3-4 cigarettes per day. She denies any worsening lower extremity edema. Because of the patient's chest pain, admission was requested. Cardiology was consulted and has seen the pt. Patient has a history of coronary artery disease with DES, and she is on plavix. Last cardiac cath 07/09/11 which showed mild to moderate stable CAD. She had a right lower lobectomy for stage IA non-small cell lung cancer per Dr. Arlyce Dice in October 2012. Cardiology did not recommend any further workup after the patient's troponins were negative x3.  Notably, the patient had a nuclear medicine stress test on 04/02/2014 that was interpreted to be Intermediate risk stress nuclear study with a mostly fixed anteroseptal and apical fixed defect, consistent with possible scar or rate-related LBBB artifact. EKG shows sinus rhythm with LBBB. D-dimer was elevated. As a result, CT angiogram of the chest was obtained which was negative for pulmonary embolus. Chest x-ray revealed possible right lower lobe opacity/infiltrate. As a result, the patient was started on intravenous antibiotics. The patient had  diffuse wheezing, and in combination with the clinical history, the patient was treated for a COPD exacerbation.  Assessment/Plan:  Atypical chest pain  -EKG essentially unchanged from previous EKGs  -Cycle troponins--negative  -D-dimer--0.68  -Chest x-ray-- right lower lobe infiltrate  -Appreciate cardiology consultation  -Continue Plavix  -CTA chest--neg for PE  CAP  -Continue ceftriaxone and azithromycin  -Urine Legionella antigen--neg  -Urine Streptococcus pneumoniae antigen--neg  COPD exacerbation  -Patient has a mild degree of wheezing on examination and complains of slight worsening of shortness of breath even prior to the hospitalization  -continue IV solumedrol--decrease to bid -Aerosolized albuterol and atrovent  -Pulmonary hygiene  -pt is on 3L at night at home  -ambulatory pulse ox Tobacco abuse  -Tobacco cessation discussed  Hypertension  -Hold amlodipine as the patient's blood pressure was soft-->BP remains stable off of amlodipine  Hyperlipidemia  -Continue statin  Diabetes mellitus type 2, controlled  -Hold Januvia  -NovoLog sliding scale while pt is on steroids  -Repeat hemoglobin A1C--6.1  CAD  -continue plavix  Depression  -Continue Cymbalta and Xanax  Family Communication: Daughter updated at bedside  Disposition Plan: Home when medically stable  Antibiotics:  Azithromycin 04/27/2014>>> Ceftriaxone 04/27/2014>>>        Procedures/Studies: Dg Chest 2 View  04/26/2014   CLINICAL DATA:  Chest pain.  Status post neck surgery.  EXAM: CHEST  2 VIEW  COMPARISON:  Two-view chest 04/19/2014.  FINDINGS: The heart size is exaggerated by low lung volumes. Emphysematous changes are again noted. New right lower lobe airspace disease is concerning for early bronchopneumonia or aspiration.  Biapical scarring is stable. The visualized soft tissues and bony thorax are otherwise unremarkable.  IMPRESSION: 1. New right lower lobe airspace disease is concerning for  bronchopneumonia or aspiration. 2. Emphysema.   Electronically Signed   By: Lawrence Santiago M.D.   On: 04/26/2014 18:00   Dg Chest 2 View  04/19/2014   CLINICAL DATA:  Coronary disease, preop evaluation for left neck surgery  EXAM: CHEST  2 VIEW  COMPARISON:  09/26/2013, 12/05/2013  FINDINGS: Stable postop changes in the right hilum and lower lobe from prior partial lung resection. Chronic right hilar and basilar scarring. No superimposed edema, pneumonia, collapse or consolidation. No effusion or pneumothorax. Trachea midline. Normal heart size and vascularity. Atherosclerosis of the aorta. Mild hyperinflation noted.  IMPRESSION: Stable postop changes with volume loss and scarring in the right hemi thorax.  No superimposed acute process   Electronically Signed   By: Daryll Brod M.D.   On: 04/19/2014 12:52   Ct Angio Chest Pe W/cm &/or Wo Cm  04/27/2014   CLINICAL DATA:  Chest pain, shortness of breath, elevated D-dimer.  EXAM: CT ANGIOGRAPHY CHEST WITH CONTRAST  TECHNIQUE: Multidetector CT imaging of the chest was performed using the standard protocol during bolus administration of intravenous contrast. Multiplanar CT image reconstructions and MIPs were obtained to evaluate the vascular anatomy.  CONTRAST:  76mL OMNIPAQUE IOHEXOL 350 MG/ML SOLN  COMPARISON:  09/26/2013  FINDINGS: THORACIC INLET/BODY WALL:  No acute abnormality.  MEDIASTINUM:  Mild cardiomegaly. Diffuse atherosclerosis, including the coronary arteries. There is diffuse calcification of both the left and right coronary circulation. Mild lipomatous change of the interatrial septum. Negative for pulmonary arterial filling defect. There is exclusion of the posterior costophrenic sulci, which does not exclude any pulmonary arteries within the study resolution. No acute systemic arterial finding. Negative esophagus. Stable volume sub- carina lymph nodes.  LUNG WINDOWS:  Postsurgical volume loss and chronic reticular densities in the right lower  lobe. There is mild atelectasis in this region, but no increasing masslike abnormality to suggest recurrent disease. No consolidation, edema, effusion, or pneumothorax.  UPPER ABDOMEN:  No acute findings.  OSSEOUS:  No acute fracture.  No suspicious lytic or blastic lesions.  Review of the MIP images confirms the above findings.  IMPRESSION: Negative for pulmonary embolism or other acute intrathoracic abnormality.   Electronically Signed   By: Jorje Guild M.D.   On: 04/27/2014 10:51         Subjective:   Objective: Filed Vitals:   04/28/14 2010 04/28/14 2134 04/29/14 0152 04/29/14 0647  BP:  135/69  138/60  Pulse:  88  65  Temp:  97.5 F (36.4 C)  98.1 F (36.7 C)  TempSrc:  Oral  Oral  Resp:  17  22  Height:      Weight:      SpO2: 94% 95% 95% 94%    Intake/Output Summary (Last 24 hours) at 04/29/14 1246 Last data filed at 04/29/14 1100  Gross per 24 hour  Intake    120 ml  Output   1275 ml  Net  -1155 ml   Weight change:  Exam:   General:  Pt is alert, follows commands appropriately, not in acute distress  HEENT: No icterus, No thrush, No neck mass, [place]/AT  Cardiovascular: RRR, S1/S2, no rubs, no gallops  Respiratory: CTA bilaterally, no wheezing, no crackles, no rhonchi  Abdomen: Soft/+BS, non tender, non distended, no guarding  Extremities: No edema, No lymphangitis, No petechiae, No rashes, no  synovitis  Data Reviewed: Basic Metabolic Panel:  Recent Labs Lab 04/26/14 1635 04/28/14 0410 04/29/14 0650  NA 144 141 142  K 4.0 4.7 4.1  CL 105 101 102  CO2 25 25 27   GLUCOSE 146* 177* 169*  BUN 10 18 28*  CREATININE 0.86 0.88 0.84  CALCIUM 8.9 9.2 8.9   Liver Function Tests: No results found for this basename: AST, ALT, ALKPHOS, BILITOT, PROT, ALBUMIN,  in the last 168 hours No results found for this basename: LIPASE, AMYLASE,  in the last 168 hours No results found for this basename: AMMONIA,  in the last 168 hours CBC:  Recent Labs Lab  04/26/14 1635 04/28/14 0410  WBC 8.9 14.6*  HGB 12.8 11.7*  HCT 38.9 36.1  MCV 92.8 92.6  PLT 208 211   Cardiac Enzymes:  Recent Labs Lab 04/26/14 1635 04/26/14 2205 04/27/14 0335  TROPONINI <0.30 <0.30 <0.30   BNP: No components found with this basename: POCBNP,  CBG:  Recent Labs Lab 04/28/14 1139 04/28/14 1637 04/28/14 2204 04/29/14 0758 04/29/14 1144  GLUCAP 146* 158* 144* 141* 159*    No results found for this or any previous visit (from the past 240 hour(s)).   Scheduled Meds: . ALPRAZolam  0.5 mg Oral Daily  . ALPRAZolam  1 mg Oral QHS  . atorvastatin  20 mg Oral QHS  . azithromycin  500 mg Oral q1800  . cefTRIAXone (ROCEPHIN)  IV  1 g Intravenous Q24H  . clopidogrel  75 mg Oral Q breakfast  . DULoxetine  60 mg Oral Daily  . enoxaparin (LOVENOX) injection  40 mg Subcutaneous Q24H  . gabapentin  300-600 mg Oral BID  . insulin aspart  0-9 Units Subcutaneous TID WC  . ipratropium-albuterol  3 mL Nebulization Q6H  . levothyroxine  125 mcg Oral QAC breakfast  . methylPREDNISolone (SOLU-MEDROL) injection  60 mg Intravenous Q12H  . pantoprazole  40 mg Oral BID  . polyethylene glycol  17 g Oral Daily  . sodium chloride  3 mL Intravenous Q12H   Continuous Infusions:    Mehgan Santmyer, DO  [facility] Pager [phone]  If 7PM-7AM, please contact night-coverage [email or web address] Password [email or web address] 04/29/2014, 12:46 PM   LOS: 3 days

## 2014-04-30 ENCOUNTER — Inpatient Hospital Stay (HOSPITAL_COMMUNITY): Payer: Medicare Other

## 2014-04-30 LAB — URINALYSIS, ROUTINE W REFLEX MICROSCOPIC
Bilirubin Urine: NEGATIVE
Glucose, UA: NEGATIVE mg/dL
HGB URINE DIPSTICK: NEGATIVE
KETONES UR: NEGATIVE mg/dL
Leukocytes, UA: NEGATIVE
NITRITE: NEGATIVE
PROTEIN: NEGATIVE mg/dL
Specific Gravity, Urine: 1.015 (ref 1.005–1.030)
UROBILINOGEN UA: 0.2 mg/dL (ref 0.0–1.0)
pH: 7 (ref 5.0–8.0)

## 2014-04-30 LAB — GLUCOSE, CAPILLARY
GLUCOSE-CAPILLARY: 126 mg/dL — AB (ref 70–99)
Glucose-Capillary: 137 mg/dL — ABNORMAL HIGH (ref 70–99)
Glucose-Capillary: 140 mg/dL — ABNORMAL HIGH (ref 70–99)
Glucose-Capillary: 137 mg/dL — ABNORMAL HIGH (ref 70–99)

## 2014-04-30 MED ORDER — IPRATROPIUM-ALBUTEROL 0.5-2.5 (3) MG/3ML IN SOLN
3.0000 mL | Freq: Three times a day (TID) | RESPIRATORY_TRACT | Status: DC
  Administered 2014-04-30 – 2014-05-02 (×6): 3 mL via RESPIRATORY_TRACT
  Filled 2014-04-30 (×7): qty 3

## 2014-04-30 MED ORDER — BUDESONIDE 0.25 MG/2ML IN SUSP
0.2500 mg | Freq: Two times a day (BID) | RESPIRATORY_TRACT | Status: DC
  Administered 2014-04-30 – 2014-05-02 (×4): 0.25 mg via RESPIRATORY_TRACT
  Filled 2014-04-30 (×7): qty 2

## 2014-04-30 MED ORDER — HYDROCODONE-HOMATROPINE 5-1.5 MG/5ML PO SYRP: 5.0000 mL | ORAL_SOLUTION | ORAL | Status: DC | PRN

## 2014-04-30 MED ORDER — METHYLPREDNISOLONE SODIUM SUCC 125 MG IJ SOLR
60.0000 mg | Freq: Four times a day (QID) | INTRAMUSCULAR | Status: DC
  Administered 2014-04-30 – 2014-05-01 (×4): 60 mg via INTRAVENOUS
  Filled 2014-04-30 (×8): qty 0.96

## 2014-04-30 NOTE — Progress Notes (Signed)
PROGRESS NOTE  Madison Henson XKG:[id number] DOB: Aug 23, 1928 DOA: 04/26/2014 PCP: Delphina Cahill, MD  Brief History  80 yo female with history of COPD, diabetes mellitus, CAD, HTN, continued Tobacco abuse, Hyperlipidemia, LBBB, DM, GERD, anxiety/depression. 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 A new DES was placed at that time. The patient had a surgical excision of the neck skin lesion performed by Dr. Wilburn Cornelia today (04/26/14). When the patient woke up in PACU, she complained of chest pain that was substernal and sharp in nature. She stated it was worse with inspiration without any radiation to her arm, neck, back, or jaw. She states that she has had some shortness of breath for a few days prior to today that was a little worse than usual. She continues to smoke 3-4 cigarettes per day. She denies any worsening lower extremity edema. Because of the patient's chest pain, admission was requested. Cardiology was consulted and has seen the pt. Patient has a history of coronary artery disease with DES, and she is on plavix. Last cardiac cath 07/09/11 which showed mild to moderate stable CAD. She had a right lower lobectomy for stage IA non-small cell lung cancer per Dr. Arlyce Dice in October 2012. Cardiology did not recommend any further workup after the patient's troponins were negative x3.  Notably, the patient had a nuclear medicine stress test on 04/02/2014 that was interpreted to be Intermediate risk stress nuclear study with a mostly fixed anteroseptal and apical fixed defect, consistent with possible scar or rate-related LBBB artifact. EKG shows sinus rhythm with LBBB. D-dimer was elevated. As a result, CT angiogram of the chest was obtained which was negative for pulmonary embolus. Chest x-ray revealed possible right lower lobe opacity/infiltrate. As a result, the patient was started on intravenous antibiotics. The patient had  diffuse wheezing, and in combination with the clinical history, the patient was treated for a COPD exacerbation. The patient initially showed improvement and was prepared for discharge on 04/30/2014 when she started complaining of worsening shortness of breath with cough. The patient had increased wheezing with weaning her steroids. Assessment/Plan:  COPD exacerbation  -Patient haswheezing on examination and complains of worsening of shortness of breath-on 04/30/2014--> cancel discharge -increase IV solumedrol back to q 6 hrs -Aerosolized albuterol and atrovent  -Pulmonary hygiene  -pt is on 3L at night at home  -Start Pulmicort nebulizer -Repeat chest x-ray today Atypical chest pain  -EKG essentially unchanged from previous EKGs  -Cycle troponins--negative  -D-dimer--0.68  -Chest x-ray-- right lower lobe infiltrate  -Appreciate cardiology consultation  -Continue Plavix  -CTA chest--neg for PE  -likely related to vigorous coughing-->start Hycodan CAP  -Continue ceftriaxone and azithromycin-->switched to po abx on 04/30/14 -Cefpodoxime and azithromycin for 4 more days including today--today is day #4 of 7 of antibiotics  -Urine Legionella antigen--neg  -Urine Streptococcus pneumoniae antigen--neg  Tobacco abuse  -Tobacco cessation discussed  Hypertension  -Hold amlodipine as the patient's blood pressure was soft-->BP remains stable off of amlodipine  Hyperlipidemia  -Continue statin  Diabetes mellitus type 2, controlled  -Hold Januvia  -NovoLog sliding scale while pt is on steroids  -Repeat hemoglobin A1C--6.1  CAD  -continue plavix  Depression  -Continue Cymbalta and Xanax  Family Communication: Daughter updated at bedside  Disposition Plan: Home when medically stable  Antibiotics:  Azithromycin 04/27/2014>>> Ceftriaxone 04/27/2014>>>04/29/2014 Cefpodoxime 04/30/2014>>>     Procedures/Studies: Dg Chest 2 View  04/26/2014   CLINICAL DATA:  Chest pain.  Status post neck  surgery.  EXAM: CHEST  2 VIEW  COMPARISON:  Two-view chest 04/19/2014.  FINDINGS: The heart size is exaggerated by low lung volumes. Emphysematous changes are again noted. New right lower lobe airspace disease is concerning for early bronchopneumonia or aspiration. Biapical scarring is stable. The visualized soft tissues and bony thorax are otherwise unremarkable.  IMPRESSION: 1. New right lower lobe airspace disease is concerning for bronchopneumonia or aspiration. 2. Emphysema.   Electronically Signed   By: Lawrence Santiago M.D.   On: 04/26/2014 18:00   Dg Chest 2 View  04/19/2014   CLINICAL DATA:  Coronary disease, preop evaluation for left neck surgery  EXAM: CHEST  2 VIEW  COMPARISON:  09/26/2013, 12/05/2013  FINDINGS: Stable postop changes in the right hilum and lower lobe from prior partial lung resection. Chronic right hilar and basilar scarring. No superimposed edema, pneumonia, collapse or consolidation. No effusion or pneumothorax. Trachea midline. Normal heart size and vascularity. Atherosclerosis of the aorta. Mild hyperinflation noted.  IMPRESSION: Stable postop changes with volume loss and scarring in the right hemi thorax.  No superimposed acute process   Electronically Signed   By: Daryll Brod M.D.   On: 04/19/2014 12:52   Ct Angio Chest Pe W/cm &/or Wo Cm  04/27/2014   CLINICAL DATA:  Chest pain, shortness of breath, elevated D-dimer.  EXAM: CT ANGIOGRAPHY CHEST WITH CONTRAST  TECHNIQUE: Multidetector CT imaging of the chest was performed using the standard protocol during bolus administration of intravenous contrast. Multiplanar CT image reconstructions and MIPs were obtained to evaluate the vascular anatomy.  CONTRAST:  48mL OMNIPAQUE IOHEXOL 350 MG/ML SOLN  COMPARISON:  09/26/2013  FINDINGS: THORACIC INLET/BODY WALL:  No acute abnormality.  MEDIASTINUM:  Mild cardiomegaly. Diffuse atherosclerosis, including the coronary arteries. There is diffuse calcification of both the left and right  coronary circulation. Mild lipomatous change of the interatrial septum. Negative for pulmonary arterial filling defect. There is exclusion of the posterior costophrenic sulci, which does not exclude any pulmonary arteries within the study resolution. No acute systemic arterial finding. Negative esophagus. Stable volume sub- carina lymph nodes.  LUNG WINDOWS:  Postsurgical volume loss and chronic reticular densities in the right lower lobe. There is mild atelectasis in this region, but no increasing masslike abnormality to suggest recurrent disease. No consolidation, edema, effusion, or pneumothorax.  UPPER ABDOMEN:  No acute findings.  OSSEOUS:  No acute fracture.  No suspicious lytic or blastic lesions.  Review of the MIP images confirms the above findings.  IMPRESSION: Negative for pulmonary embolism or other acute intrathoracic abnormality.   Electronically Signed   By: Jorje Guild M.D.   On: 04/27/2014 10:51         Subjective:  patient complains of increasing shortness of breath and chest pain with cough. Denies hemoptysis, vomiting, abdominal pain, diarrhea, dysuria, hematuria. Complains of some nausea. Denies fevers or chills.  Objective: Filed Vitals:   04/29/14 2035 04/29/14 2124 04/30/14 0203 04/30/14 0454  BP:  137/62  131/57  Pulse:  83  62  Temp:  97.4 F (36.3 C)  97.5 F (36.4 C)  TempSrc:  Oral  Oral  Resp:  20  16  Height:      Weight:      SpO2: 95% 94% 92% 92%    Intake/Output Summary (Last 24 hours) at 04/30/14 1046 Last data filed at 04/29/14 2100  Gross per 24 hour  Intake  0 ml  Output    450 ml  Net   -450 ml   Weight change:  Exam:   General:  Pt is alert, follows commands appropriately, not in acute distress  HEENT: No icterus, No thrush,  [place]/AT  Cardiovascular: RRR, S1/S2, no rubs, no gallops  Respiratory: bilateral expiratory wheeze. Good air movement   Abdomen: Soft/+BS, non tender, non distended, no guarding  Extremities: No edema,  No lymphangitis, No petechiae, No rashes, no synovitis  Data Reviewed: Basic Metabolic Panel:  Recent Labs Lab 04/26/14 1635 04/28/14 0410 04/29/14 0650  NA 144 141 142  K 4.0 4.7 4.1  CL 105 101 102  CO2 25 25 27   GLUCOSE 146* 177* 169*  BUN 10 18 28*  CREATININE 0.86 0.88 0.84  CALCIUM 8.9 9.2 8.9   Liver Function Tests: No results found for this basename: AST, ALT, ALKPHOS, BILITOT, PROT, ALBUMIN,  in the last 168 hours No results found for this basename: LIPASE, AMYLASE,  in the last 168 hours No results found for this basename: AMMONIA,  in the last 168 hours CBC:  Recent Labs Lab 04/26/14 1635 04/28/14 0410  WBC 8.9 14.6*  HGB 12.8 11.7*  HCT 38.9 36.1  MCV 92.8 92.6  PLT 208 211   Cardiac Enzymes:  Recent Labs Lab 04/26/14 1635 04/26/14 2205 04/27/14 0335  TROPONINI <0.30 <0.30 <0.30   BNP: No components found with this basename: POCBNP,  CBG:  Recent Labs Lab 04/29/14 0758 04/29/14 1144 04/29/14 1722 04/29/14 2124 04/30/14 0751  GLUCAP 141* 159* 157* 148* 137*    No results found for this or any previous visit (from the past 240 hour(s)).   Scheduled Meds: . ALPRAZolam  0.5 mg Oral Daily  . ALPRAZolam  1 mg Oral QHS  . atorvastatin  20 mg Oral QHS  . azithromycin  500 mg Oral q1800  . budesonide (PULMICORT) nebulizer solution  0.25 mg Nebulization BID  . cefpodoxime  200 mg Oral Q12H  . clopidogrel  75 mg Oral Q breakfast  . DULoxetine  60 mg Oral Daily  . enoxaparin (LOVENOX) injection  40 mg Subcutaneous Q24H  . gabapentin  300-600 mg Oral BID  . insulin aspart  0-9 Units Subcutaneous TID WC  . ipratropium-albuterol  3 mL Nebulization TID  . levothyroxine  125 mcg Oral QAC breakfast  . methylPREDNISolone (SOLU-MEDROL) injection  60 mg Intravenous Q6H  . pantoprazole  40 mg Oral BID  . polyethylene glycol  17 g Oral Daily  . sodium chloride  3 mL Intravenous Q12H   Continuous Infusions:    TAT, DAVID, DO  [facility] Pager [phone]  If 7PM-7AM, please contact night-coverage [email or web address] Password [email or web address] 04/30/2014, 10:46 AM   LOS: 4 days

## 2014-05-01 DIAGNOSIS — J9600 Acute respiratory failure, unspecified whether with hypoxia or hypercapnia: Secondary | ICD-10-CM

## 2014-05-01 LAB — URINE CULTURE
COLONY COUNT: NO GROWTH
CULTURE: NO GROWTH

## 2014-05-01 LAB — GLUCOSE, CAPILLARY
GLUCOSE-CAPILLARY: 188 mg/dL — AB (ref 70–99)
GLUCOSE-CAPILLARY: 121 mg/dL — AB (ref 70–99)
Glucose-Capillary: 159 mg/dL — ABNORMAL HIGH (ref 70–99)
Glucose-Capillary: 123 mg/dL — ABNORMAL HIGH (ref 70–99)

## 2014-05-01 LAB — BASIC METABOLIC PANEL
BUN: 26 mg/dL — ABNORMAL HIGH (ref 6–23)
CALCIUM: 8.6 mg/dL (ref 8.4–10.5)
CHLORIDE: 103 meq/L (ref 96–112)
CO2: 28 mEq/L (ref 19–32)
Creatinine, Ser: 0.81 mg/dL (ref 0.50–1.10)
GFR calc Af Amer: 76 mL/min — ABNORMAL LOW (ref >90)
GFR calc non Af Amer: 65 mL/min — ABNORMAL LOW (ref >90)
GLUCOSE: 161 mg/dL — AB (ref 70–99)
Potassium: 4.3 mEq/L (ref 3.7–5.3)
SODIUM: 142 meq/L (ref 137–147)

## 2014-05-01 MED ORDER — METHYLPREDNISOLONE SODIUM SUCC 125 MG IJ SOLR
60.0000 mg | INTRAMUSCULAR | Status: DC
  Administered 2014-05-02: 60 mg via INTRAVENOUS
  Filled 2014-05-01 (×2): qty 0.96

## 2014-05-01 NOTE — Progress Notes (Addendum)
PROGRESS NOTE  Madison Henson MBW:[id number] DOB: [birth year] DOA: 04/26/2014 PCP: Delphina Cahill, MD  Brief History  80 yo female with history of COPD, diabetes mellitus, CAD, HTN, continued Tobacco abuse, Hyperlipidemia, LBBB, DM, GERD, anxiety/depression. 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 A new DES was placed at that time. The patient had a surgical excision of the neck skin lesion performed by Dr. Wilburn Cornelia today (04/26/14). When the patient woke up in PACU, she complained of chest pain that was substernal and sharp in nature. She stated it was worse with inspiration without any radiation to her arm, neck, back, or jaw. She states that she has had some shortness of breath for a few days prior to today that was a little worse than usual. She continues to smoke 3-4 cigarettes per day. She denies any worsening lower extremity edema. Because of the patient's chest pain, admission was requested. Cardiology was consulted and has seen the pt. Patient has a history of coronary artery disease with DES, and she is on plavix. Last cardiac cath 07/09/11 which showed mild to moderate stable CAD. She had a right lower lobectomy for stage IA non-small cell lung cancer per Dr. Arlyce Dice in October 2012. Cardiology did not recommend any further workup after the patient's troponins were negative x3.  Notably, the patient had a nuclear medicine stress test on 04/02/2014 that was interpreted to be Intermediate risk stress nuclear study with a mostly fixed anteroseptal and apical fixed defect, consistent with possible scar or rate-related LBBB artifact. EKG shows sinus rhythm with LBBB. D-dimer was elevated. As a result, CT angiogram of the chest was obtained which was negative for pulmonary embolus. Chest x-ray revealed possible right lower lobe opacity/infiltrate. As a result, the patient was started on intravenous antibiotics. The patient had  diffuse wheezing, and in combination with the clinical history, the patient was treated for a COPD exacerbation. The patient initially showed improvement and was prepared for discharge on 04/30/2014 when she started complaining of worsening shortness of breath with cough. The patient had increased wheezing with weaning her steroids.  Assessment/Plan:  COPD exacerbation  -Patient haswheezing on examination and complains of worsening of shortness of breath-on 04/30/2014--> cancel discharge -will start titrating IV solumedrol- start Prednisone in AM -Aerosolized albuterol and atrovent  -Pulmonary hygiene  -pt is on 3L at night at home  -Start Pulmicort nebulizer -Repeat chest x-ray reveals right basilar atelectasis  Atypical chest pain  -EKG essentially unchanged from previous EKGs  -Cycle troponins--negative  -D-dimer--0.68  -Chest x-ray-- right lower lobe infiltrate  -Appreciate cardiology consultation  -Continue Plavix  -CTA chest--neg for PE  -likely related to vigorous coughing-->started Hycodan  CAP  -Continue ceftriaxone and azithromycin-->switched to po abx on 04/30/14 -Cefpodoxime and azithromycin for 4 more days including today-- -Urine Legionella antigen--neg  -Urine Streptococcus pneumoniae antigen--neg   Tobacco abuse  -Tobacco cessation discussed   Hypertension  -Hold amlodipine as the patient's blood pressure was soft-->BP remains stable off of amlodipine   Hyperlipidemia  -Continue statin   Diabetes mellitus type 2, controlled  -Hold Januvia  -NovoLog sliding scale while pt is on steroids  -Repeat hemoglobin A1C--6.1   CAD  -continue plavix   Depression  -Continue Cymbalta and Xanax   Dementia? - pt unable to remember me talking to her  Family Communication: spoke with niece today Disposition Plan: Home when medically stable  Antibiotics:  Azithromycin 04/27/2014>>> Ceftriaxone 04/27/2014>>>04/29/2014 Cefpodoxime  04/30/2014>>>   Procedures/Studies: Dg Chest 2 View  04/26/2014   CLINICAL DATA:  Chest pain.  Status post neck surgery.  EXAM: CHEST  2 VIEW  COMPARISON:  Two-view chest 04/19/2014.  FINDINGS: The heart size is exaggerated by low lung volumes. Emphysematous changes are again noted. New right lower lobe airspace disease is concerning for early bronchopneumonia or aspiration. Biapical scarring is stable. The visualized soft tissues and bony thorax are otherwise unremarkable.  IMPRESSION: 1. New right lower lobe airspace disease is concerning for bronchopneumonia or aspiration. 2. Emphysema.   Electronically Signed   By: Lawrence Santiago M.D.   On: 04/26/2014 18:00   Dg Chest 2 View  04/19/2014   CLINICAL DATA:  Coronary disease, preop evaluation for left neck surgery  EXAM: CHEST  2 VIEW  COMPARISON:  09/26/2013, 12/05/2013  FINDINGS: Stable postop changes in the right hilum and lower lobe from prior partial lung resection. Chronic right hilar and basilar scarring. No superimposed edema, pneumonia, collapse or consolidation. No effusion or pneumothorax. Trachea midline. Normal heart size and vascularity. Atherosclerosis of the aorta. Mild hyperinflation noted.  IMPRESSION: Stable postop changes with volume loss and scarring in the right hemi thorax.  No superimposed acute process   Electronically Signed   By: Daryll Brod M.D.   On: 04/19/2014 12:52   Ct Angio Chest Pe W/cm &/or Wo Cm  04/27/2014   CLINICAL DATA:  Chest pain, shortness of breath, elevated D-dimer.  EXAM: CT ANGIOGRAPHY CHEST WITH CONTRAST  TECHNIQUE: Multidetector CT imaging of the chest was performed using the standard protocol during bolus administration of intravenous contrast. Multiplanar CT image reconstructions and MIPs were obtained to evaluate the vascular anatomy.  CONTRAST:  48mL OMNIPAQUE IOHEXOL 350 MG/ML SOLN  COMPARISON:  09/26/2013  FINDINGS: THORACIC INLET/BODY WALL:  No acute abnormality.  MEDIASTINUM:  Mild cardiomegaly.  Diffuse atherosclerosis, including the coronary arteries. There is diffuse calcification of both the left and right coronary circulation. Mild lipomatous change of the interatrial septum. Negative for pulmonary arterial filling defect. There is exclusion of the posterior costophrenic sulci, which does not exclude any pulmonary arteries within the study resolution. No acute systemic arterial finding. Negative esophagus. Stable volume sub- carina lymph nodes.  LUNG WINDOWS:  Postsurgical volume loss and chronic reticular densities in the right lower lobe. There is mild atelectasis in this region, but no increasing masslike abnormality to suggest recurrent disease. No consolidation, edema, effusion, or pneumothorax.  UPPER ABDOMEN:  No acute findings.  OSSEOUS:  No acute fracture.  No suspicious lytic or blastic lesions.  Review of the MIP images confirms the above findings.  IMPRESSION: Negative for pulmonary embolism or other acute intrathoracic abnormality.   Electronically Signed   By: Jorje Guild M.D.   On: 04/27/2014 10:51         Subjective:  patient complains of increasing shortness of breath and chest pain with cough. Denies hemoptysis, vomiting, abdominal pain, diarrhea, dysuria, hematuria. Complains of some nausea. Denies fevers or chills.  Objective: Filed Vitals:   05/01/14 0457 05/01/14 0954 05/01/14 1326 05/01/14 1419  BP: 147/47  134/74   Pulse: 60  85   Temp: 97.8 F (36.6 C)  98.1 F (36.7 C)   TempSrc: Oral  Oral   Resp: 16  17   Height:      Weight:      SpO2: 95% 96% 97% 96%    Intake/Output Summary (Last 24 hours) at 05/01/14  [place] filed at 05/01/14 1326  Gross per 24 hour  Intake    480 ml  Output    650 ml  Net   -170 ml   Weight change:  Exam:   General:  Pt is alert, follows commands appropriately, not in acute distress  HEENT: No icterus, No thrush,  [place]/AT  Cardiovascular: RRR, S1/S2, no rubs, no gallops  Respiratory: no wheezing today-   Good air movement   Abdomen: Soft/+BS, non tender, non distended, no guarding  Extremities: No edema, No lymphangitis, No petechiae, No rashes, no synovitis  Data Reviewed: Basic Metabolic Panel:  Recent Labs Lab 04/26/14 1635 04/28/14 0410 04/29/14 0650 05/01/14 0717  NA 144 141 142 142  K 4.0 4.7 4.1 4.3  CL 105 101 102 103  CO2 25 25 27 28   GLUCOSE 146* 177* 169* 161*  BUN 10 18 28* 26*  CREATININE 0.86 0.88 0.84 0.81  CALCIUM 8.9 9.2 8.9 8.6   Liver Function Tests: No results found for this basename: AST, ALT, ALKPHOS, BILITOT, PROT, ALBUMIN,  in the last 168 hours No results found for this basename: LIPASE, AMYLASE,  in the last 168 hours No results found for this basename: AMMONIA,  in the last 168 hours CBC:  Recent Labs Lab 04/26/14 1635 04/28/14 0410  WBC 8.9 14.6*  HGB 12.8 11.7*  HCT 38.9 36.1  MCV 92.8 92.6  PLT 208 211   Cardiac Enzymes:  Recent Labs Lab 04/26/14 1635 04/26/14 2205 04/27/14 0335  TROPONINI <0.30 <0.30 <0.30   BNP: No components found with this basename: POCBNP,  CBG:  Recent Labs Lab 04/30/14 1153 04/30/14 1807 04/30/14 2211 05/01/14 0724 05/01/14 1127  GLUCAP 140* 137* 126* 159* 188*    Recent Results (from the past 240 hour(s))  URINE CULTURE     Status: None   Collection Time    04/30/14  5:48 PM      Result Value Ref Range Status   Specimen Description URINE, CLEAN CATCH   Final   Special Requests NONE   Final   Culture  Setup Time     Final   Value: 04/30/2014 18:47     Performed at [facility]     Final   Value: NO GROWTH     Performed at [facility]   Culture     Final   Value: NO GROWTH     Performed at [facility]   Report Status 05/01/2014 FINAL   Final     Scheduled Meds: . ALPRAZolam  0.5 mg Oral Daily  . ALPRAZolam  1 mg Oral QHS  . atorvastatin  20 mg Oral QHS  . azithromycin  500 mg Oral q1800  . budesonide (PULMICORT) nebulizer solution  0.25  mg Nebulization BID  . cefpodoxime  200 mg Oral Q12H  . clopidogrel  75 mg Oral Q breakfast  . DULoxetine  60 mg Oral Daily  . enoxaparin (LOVENOX) injection  40 mg Subcutaneous Q24H  . gabapentin  300-600 mg Oral BID  . insulin aspart  0-9 Units Subcutaneous TID WC  . ipratropium-albuterol  3 mL Nebulization TID  . levothyroxine  125 mcg Oral QAC breakfast  . [START ON 05/02/2014] methylPREDNISolone (SOLU-MEDROL) injection  60 mg Intravenous Q24H  . pantoprazole  40 mg Oral BID  . polyethylene glycol  17 g Oral Daily  . sodium chloride  3 mL Intravenous Q12H   Continuous Infusions:    [place],  DO  [facility] Pager [phone]  If 7PM-7AM, please contact night-coverage [email or web address] Password [email or web address] 05/01/2014, 3:55 PM   LOS: 5 days

## 2014-05-02 DIAGNOSIS — J9600 Acute respiratory failure, unspecified whether with hypoxia or hypercapnia: Secondary | ICD-10-CM | POA: Diagnosis present

## 2014-05-02 LAB — GLUCOSE, CAPILLARY
GLUCOSE-CAPILLARY: 91 mg/dL (ref 70–99)
GLUCOSE-CAPILLARY: 90 mg/dL (ref 70–99)

## 2014-05-02 MED ORDER — GUAIFENESIN ER 600 MG PO TB12
600.0000 mg | ORAL_TABLET | Freq: Two times a day (BID) | ORAL | Status: DC
  Administered 2014-05-02: 600 mg via ORAL
  Filled 2014-05-02: qty 1

## 2014-05-02 MED ORDER — PREDNISONE 10 MG PO TABS: 60.0000 mg | ORAL_TABLET | Freq: Every day | ORAL | Status: DC

## 2014-05-02 MED ORDER — BUDESONIDE-FORMOTEROL FUMARATE 160-4.5 MCG/ACT IN AERO: 2.0000 | INHALATION_SPRAY | Freq: Two times a day (BID) | RESPIRATORY_TRACT | Status: DC

## 2014-05-02 MED ORDER — GUAIFENESIN ER 600 MG PO TB12: 600.0000 mg | ORAL_TABLET | Freq: Two times a day (BID) | ORAL | Status: DC

## 2014-05-02 NOTE — Progress Notes (Signed)
Discussed discharge summary with pt. Reviewed all medications with pt. Pt did not have any further questions.

## 2014-05-02 NOTE — Care Management Note (Signed)
  Page 1 of 1   05/02/2014     10:40:11 AM CARE MANAGEMENT NOTE 05/02/2014  Patient:  Madison Henson, Madison Henson   Account Number:  [id number]  Date Initiated:  05/02/2014  Documentation initiated by:  Magdalen Spatz  Subjective/Objective Assessment:     Action/Plan:   Anticipated DC Date:  05/02/2014   Anticipated DC Plan:  [place]         Choice offered to / List presented to:  C-1 Patient        [place] arranged  HH-1 RN      [place]   Status of service:  Completed, signed off Medicare Important Message given?   (If response is "NO", the following Medicare IM given date fields will be blank) Date Medicare IM given:   Date Additional Medicare IM given:  05/02/2014  Discharge Disposition:    Per UR Regulation:    If discussed at Long Length of Stay Meetings, dates discussed:   05/02/2014    Comments:  05-02-14 Confirmed face sheet information with patient. Patient has home oxygen already for night time through [place]. Patient lives with her daughter Madison Henson [phone]. Magdalen Spatz RN BSN [phone]

## 2014-05-02 NOTE — Progress Notes (Signed)
RT instructed patient to use the [employer]. Patient achieved 1234mL the first time and 1081mL seven more times, with great patient effort.

## 2014-05-02 NOTE — Discharge Summary (Signed)
Physician Discharge Summary  Madison Henson:[id number] DOB: 1928-03-19 DOA: 04/26/2014  PCP: Delphina Cahill, MD  Admit date: 04/26/2014 Discharge date: 05/02/2014  Time spent: >45 minutes  Recommendations for Outpatient Follow-up:  1. PFTs/ pulm referral  Discharge Diagnoses:  Principal Problem:   Acute respiratory failure Active Problems:   COPD exacerbation   CAP (community acquired pneumonia)   HYPERLIPIDEMIA-MIXED   HYPERTENSION, BENIGN   Tobacco abuse   Chest pain, atypical   Skin lesion   Discharge Condition: stable  Diet recommendation: heart healthy  Filed Weights   04/26/14 0740 04/26/14 0810 04/26/14 1313  Weight: 87.544 kg (193 lb) 87.544 kg (193 lb) 96 kg (211 lb 10.3 oz)    History of present illness:  80 yo female with history of COPD, diabetes mellitus, CAD, HTN, continued Tobacco abuse, Hyperlipidemia, LBBB, DM, GERD, anxiety/depression. 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 A new DES was placed at that time. The patient had a surgical excision of the neck skin lesion performed by Dr. Wilburn Cornelia today (04/26/14). When the patient woke up in PACU, she complained of chest pain that was substernal and sharp in nature. She stated it was worse with inspiration without any radiation to her arm, neck, back, or jaw. She states that she has had some shortness of breath for a few days prior to today that was a little worse than usual. She continues to smoke 3-4 cigarettes per day. She denies any worsening lower extremity edema.   Because of the patient's chest pain, admission was requested. Cardiology was consulted and has seen the pt. Patient has a history of coronary artery disease with DES, and she is on plavix. Last cardiac cath 07/09/11 which showed mild to moderate stable CAD. She had a right lower lobectomy for stage IA non-small cell lung cancer per Dr. Arlyce Dice in October 2012. Cardiology did not  recommend any further workup after the patient's troponins were negative x3. Notably, the patient had a nuclear medicine stress test on 04/02/2014 that was interpreted to be Intermediate risk stress nuclear study with a mostly fixed anteroseptal and apical fixed defect, consistent with possible scar or rate-related LBBB artifact. EKG shows sinus rhythm with LBBB.   D-dimer was elevated. As a result, CT angiogram of the chest was obtained which was negative for pulmonary embolus. Chest x-ray revealed possible right lower lobe opacity/infiltrate. As a result, the patient was started on intravenous antibiotics. The patient had diffuse wheezing, and in combination with the clinical history, the patient was treated for a COPD exacerbation. The patient initially showed improvement and was prepared for discharge. On 04/30/2014 she started complaining of worsening shortness of breath with cough. The patient had increased wheezing with weaning her steroids.   Hospital Course:  COPD exacerbation  -Patient had wheezing on examination and complained of worsening of shortness of breath-on 04/30/2014--> discharge was cancelled- steroids were increased -have titrated down IV solumedrol- start Prednisone in tomorrow AM  -pt is on 3L at night at home  -Start Symbicort at home - add mucinex and flutter valve for chest congestion (white sputum)  Atypical chest pain  -EKG essentially unchanged from previous EKGs  -Cycle troponins--negative  -D-dimer--0.68  -Chest x-ray-- right lower lobe infiltrate  -Appreciate cardiology consultation  -Continue Plavix  -CTA chest--neg for PE  -likely related to vigorous coughing-->started Hycodan with resolution of pain  CAP  - RLL infiltrated on CXR on 6/12- cT chest revealed post-surgical  volume loss and chronic reticular densities in RLL- repeat CXR on 6/16 reveals right basilar atelectasis -was started onceftriaxone and azithromycin-->switched to po Cefpodaxime and  Azithromax on 04/30/14 - completeed a 6 day course -Urine Legionella antigen--neg  -Urine Streptococcus pneumoniae antigen--neg   Tobacco abuse  -Tobacco cessation discussed on multiple occasions  Hypertension  -cont Norvasc on d/c  Hyperlipidemia  -Continue statin   Diabetes mellitus type 2, controlled  -resume Januvia - was on SSI while here -Repeat hemoglobin A1C--6.1   CAD  -continue plavix   Depression  -Continue Cymbalta and Xanax   Dementia?  - pt unable to remember me talking to her   Procedures:  6/12- neck lesion excision   Consultations:  Dr Wilburn Cornelia  Discharge Exam: Filed Vitals:   05/02/14 0606  BP: 137/63  Pulse: 54  Temp: 97.4 F (36.3 C)  Resp: 20    General: AAO x 3, no distress Cardiovascular: RRR, no murmurs Respiratory: mild rhonci b/l  Discharge Instructions You were cared for by a hospitalist during your hospital stay. If you have any questions about your discharge medications or the care you received while you were in the hospital after you are discharged, you can call the unit and asked to speak with the hospitalist on call if the hospitalist that took care of you is not available. Once you are discharged, your primary care physician will handle any further medical issues. Please note that NO REFILLS for any discharge medications will be authorized once you are discharged, as it is imperative that you return to your primary care physician (or establish a relationship with a primary care physician if you do not have one) for your aftercare needs so that they can reassess your need for medications and monitor your lab values.      Discharge Instructions   Diet - low sodium heart healthy    Complete by:  As directed      Diet - low sodium heart healthy    Complete by:  As directed      Discharge instructions    Complete by:  As directed   1.increase activity slowly 2. May bathe and shower, keep incision dry for 3 days postop 3. Wound  care - 1/2 str H2O2 and bacitracin ointment twice daily 4. Elevate Head of Bed 6. Ice compress for 24 hrs     Increase activity slowly    Complete by:  As directed      Increase activity slowly    Complete by:  As directed             Medication List         ALPRAZolam 1 MG tablet  Commonly known as:  XANAX  Take 0.5-1 mg by mouth 2 (two) times daily. Take 0.5 mg in am and 1 mg in pm     amLODipine 5 MG tablet  Commonly known as:  NORVASC  Take 5 mg by mouth daily.     atorvastatin 20 MG tablet  Commonly known as:  LIPITOR  Take 20 mg by mouth at bedtime.     budesonide-formoterol 160-4.5 MCG/ACT inhaler  Commonly known as:  SYMBICORT  Inhale 2 puffs into the lungs 2 (two) times daily.     clopidogrel 75 MG tablet  Commonly known as:  PLAVIX  Take 75 mg by mouth daily with breakfast.     cyanocobalamin 1000 MCG/ML injection  Commonly known as:  (VITAMIN B-12)  Inject 1,000 mcg into the muscle every  30 (thirty) days.     docusate sodium 100 MG capsule  Commonly known as:  COLACE  Take 100 mg by mouth daily as needed for mild constipation.     DULoxetine 60 MG capsule  Commonly known as:  CYMBALTA  Take 60 mg by mouth daily.     gabapentin 300 MG capsule  Commonly known as:  NEURONTIN  Take 300-600 mg by mouth 2 (two) times daily. Take 300mg  in the morning & 600mg  at bedtime     guaiFENesin 600 MG 12 hr tablet  Commonly known as:  MUCINEX  Take 1 tablet (600 mg total) by mouth 2 (two) times daily.     HYDROcodone-acetaminophen 10-500 MG per tablet  Commonly known as:  LORTAB  Take 1 tablet by mouth every 6 (six) hours as needed for pain.     hydrocortisone 2.5 % rectal cream  Commonly known as:  ANUSOL-HC  Place 1 application rectally 4 (four) times daily.     JANUVIA 50 MG tablet  Generic drug:  sitaGLIPtin  Take 50 mg by mouth daily.     levothyroxine 125 MCG tablet  Commonly known as:  SYNTHROID, LEVOTHROID  Take 125 mcg by mouth daily before  breakfast.     nitroGLYCERIN 0.4 MG SL tablet  Commonly known as:  NITROSTAT  Place 0.4 mg under the tongue every 5 (five) minutes as needed for chest pain.     pantoprazole 40 MG tablet  Commonly known as:  PROTONIX  Take 40 mg by mouth 2 (two) times daily.     polyethylene glycol packet  Commonly known as:  MIRALAX / GLYCOLAX  Take 17 g by mouth daily.     predniSONE 10 MG tablet  Commonly known as:  DELTASONE  Take 6 tablets (60 mg total) by mouth daily with breakfast. Decrease by one tablet daily until gone     PROAIR HFA 108 (90 BASE) MCG/ACT inhaler  Generic drug:  albuterol  Inhale 2 puffs into the lungs every 6 (six) hours as needed. For wheezing     Vitamin D (Ergocalciferol) 50000 UNITS Caps capsule  Commonly known as:  DRISDOL  Take 50,000 Units by mouth every 7 (seven) days.       Allergies  Allergen Reactions  . Linzess [Linaclotide] Other (See Comments)    EXPLOSIVE DIARRHEA  . Oxycodone Other (See Comments)    Causes hyperactivity   . Sulfonamide Derivatives Swelling and Rash    Hands & feet swell  . Hydrocodone Other (See Comments)    Causes Hyperactivity    Follow-up Information   Follow up with SHOEMAKER, DAVID, MD In 10 days. (For suture removal)    Specialty:  Otolaryngology   Contact information:   [address] [place] [address] [phone]       Follow up with Delphina Cahill, MD. Schedule an appointment as soon as possible for a visit in 1 week.   Specialty:  Internal Medicine   Contact information:    [place] [address] [phone]        The results of significant diagnostics from this hospitalization (including imaging, microbiology, ancillary and laboratory) are listed below for reference.    Significant Diagnostic Studies: Dg Chest 2 View  04/30/2014   CLINICAL DATA:  Shortness of breath.  EXAM: CHEST  2 VIEW  COMPARISON:  04/26/2014  FINDINGS: The cardiac silhouette, mediastinal and hilar  contours are normal and stable. There is tortuosity and  calcification of the thoracic aorta. The lungs demonstrate streaky right basilar atelectasis but no infiltrates or effusions. The bony thorax is intact.  IMPRESSION: Right basilar atelectasis.  No definite infiltrates or effusions.   Electronically Signed   By: Kalman Jewels M.D.   On: 04/30/2014 17:23   Dg Chest 2 View  04/26/2014   CLINICAL DATA:  Chest pain.  Status post neck surgery.  EXAM: CHEST  2 VIEW  COMPARISON:  Two-view chest 04/19/2014.  FINDINGS: The heart size is exaggerated by low lung volumes. Emphysematous changes are again noted. New right lower lobe airspace disease is concerning for early bronchopneumonia or aspiration. Biapical scarring is stable. The visualized soft tissues and bony thorax are otherwise unremarkable.  IMPRESSION: 1. New right lower lobe airspace disease is concerning for bronchopneumonia or aspiration. 2. Emphysema.   Electronically Signed   By: Lawrence Santiago M.D.   On: 04/26/2014 18:00   Dg Chest 2 View  04/19/2014   CLINICAL DATA:  Coronary disease, preop evaluation for left neck surgery  EXAM: CHEST  2 VIEW  COMPARISON:  09/26/2013, 12/05/2013  FINDINGS: Stable postop changes in the right hilum and lower lobe from prior partial lung resection. Chronic right hilar and basilar scarring. No superimposed edema, pneumonia, collapse or consolidation. No effusion or pneumothorax. Trachea midline. Normal heart size and vascularity. Atherosclerosis of the aorta. Mild hyperinflation noted.  IMPRESSION: Stable postop changes with volume loss and scarring in the right hemi thorax.  No superimposed acute process   Electronically Signed   By: Daryll Brod M.D.   On: 04/19/2014 12:52   Ct Angio Chest Pe W/cm &/or Wo Cm  04/27/2014   CLINICAL DATA:  Chest pain, shortness of breath, elevated D-dimer.  EXAM: CT ANGIOGRAPHY CHEST WITH CONTRAST  TECHNIQUE: Multidetector CT imaging of the chest was performed using the standard  protocol during bolus administration of intravenous contrast. Multiplanar CT image reconstructions and MIPs were obtained to evaluate the vascular anatomy.  CONTRAST:  15mL OMNIPAQUE IOHEXOL 350 MG/ML SOLN  COMPARISON:  09/26/2013  FINDINGS: THORACIC INLET/BODY WALL:  No acute abnormality.  MEDIASTINUM:  Mild cardiomegaly. Diffuse atherosclerosis, including the coronary arteries. There is diffuse calcification of both the left and right coronary circulation. Mild lipomatous change of the interatrial septum. Negative for pulmonary arterial filling defect. There is exclusion of the posterior costophrenic sulci, which does not exclude any pulmonary arteries within the study resolution. No acute systemic arterial finding. Negative esophagus. Stable volume sub- carina lymph nodes.  LUNG WINDOWS:  Postsurgical volume loss and chronic reticular densities in the right lower lobe. There is mild atelectasis in this region, but no increasing masslike abnormality to suggest recurrent disease. No consolidation, edema, effusion, or pneumothorax.  UPPER ABDOMEN:  No acute findings.  OSSEOUS:  No acute fracture.  No suspicious lytic or blastic lesions.  Review of the MIP images confirms the above findings.  IMPRESSION: Negative for pulmonary embolism or other acute intrathoracic abnormality.   Electronically Signed   By: Jorje Guild M.D.   On: 04/27/2014 10:51    Microbiology: Recent Results (from the past 240 hour(s))  URINE CULTURE     Status: None   Collection Time    04/30/14  5:48 PM      Result Value Ref Range Status   Specimen Description URINE, CLEAN CATCH   Final   Special Requests NONE   Final   Culture  Setup Time     Final   Value: 04/30/2014 18:47  Performed at [facility]     Final   Value: NO GROWTH     Performed at [facility]   Culture     Final   Value: NO GROWTH     Performed at [facility]   Report Status 05/01/2014 FINAL   Final      Labs: Basic Metabolic Panel:  Recent Labs Lab 04/26/14 1635 04/28/14 0410 04/29/14 0650 05/01/14 0717  NA 144 141 142 142  K 4.0 4.7 4.1 4.3  CL 105 101 102 103  CO2 25 25 27 28   GLUCOSE 146* 177* 169* 161*  BUN 10 18 28* 26*  CREATININE 0.86 0.88 0.84 0.81  CALCIUM 8.9 9.2 8.9 8.6   Liver Function Tests: No results found for this basename: AST, ALT, ALKPHOS, BILITOT, PROT, ALBUMIN,  in the last 168 hours No results found for this basename: LIPASE, AMYLASE,  in the last 168 hours No results found for this basename: AMMONIA,  in the last 168 hours CBC:  Recent Labs Lab 04/26/14 1635 04/28/14 0410  WBC 8.9 14.6*  HGB 12.8 11.7*  HCT 38.9 36.1  MCV 92.8 92.6  PLT 208 211   Cardiac Enzymes:  Recent Labs Lab 04/26/14 1635 04/26/14 2205 04/27/14 0335  TROPONINI <0.30 <0.30 <0.30   BNP: BNP (last 3 results)  Recent Labs  12/05/13 1259  PROBNP 250.1   CBG:  Recent Labs Lab 05/01/14 1127 05/01/14 1707 05/01/14 2132 05/02/14 0732 05/02/14 1159  GLUCAP 188* 123* 121* 91 90       Signed:  Debbe Odea, MD [facility] 05/02/2014, 12:48 PM

## 2014-05-24 ENCOUNTER — Other Ambulatory Visit (HOSPITAL_COMMUNITY): Payer: Self-pay | Admitting: Internal Medicine

## 2014-05-25 ENCOUNTER — Emergency Department (HOSPITAL_COMMUNITY)
Admission: EM | Admit: 2014-05-25 | Discharge: 2014-05-25 | Disposition: A | Discharge status: Home / Self Care | Payer: Medicare Other | Attending: Emergency Medicine | Admitting: Emergency Medicine

## 2014-05-25 ENCOUNTER — Encounter (HOSPITAL_COMMUNITY): Payer: Self-pay | Admitting: Emergency Medicine

## 2014-05-25 ENCOUNTER — Emergency Department (HOSPITAL_COMMUNITY): Payer: Medicare Other

## 2014-05-25 DIAGNOSIS — Z9861 Coronary angioplasty status: Secondary | ICD-10-CM | POA: Diagnosis not present

## 2014-05-25 DIAGNOSIS — K219 Gastro-esophageal reflux disease without esophagitis: Secondary | ICD-10-CM | POA: Diagnosis not present

## 2014-05-25 DIAGNOSIS — F411 Generalized anxiety disorder: Secondary | ICD-10-CM | POA: Insufficient documentation

## 2014-05-25 DIAGNOSIS — I509 Heart failure, unspecified: Secondary | ICD-10-CM | POA: Insufficient documentation

## 2014-05-25 DIAGNOSIS — Z79899 Other long term (current) drug therapy: Secondary | ICD-10-CM | POA: Insufficient documentation

## 2014-05-25 DIAGNOSIS — I10 Essential (primary) hypertension: Secondary | ICD-10-CM | POA: Diagnosis not present

## 2014-05-25 DIAGNOSIS — Z862 Personal history of diseases of the blood and blood-forming organs and certain disorders involving the immune mechanism: Secondary | ICD-10-CM | POA: Diagnosis not present

## 2014-05-25 DIAGNOSIS — Z90710 Acquired absence of both cervix and uterus: Secondary | ICD-10-CM | POA: Insufficient documentation

## 2014-05-25 DIAGNOSIS — Z7902 Long term (current) use of antithrombotics/antiplatelets: Secondary | ICD-10-CM | POA: Insufficient documentation

## 2014-05-25 DIAGNOSIS — J449 Chronic obstructive pulmonary disease, unspecified: Secondary | ICD-10-CM

## 2014-05-25 DIAGNOSIS — I252 Old myocardial infarction: Secondary | ICD-10-CM | POA: Diagnosis not present

## 2014-05-25 DIAGNOSIS — F17200 Nicotine dependence, unspecified, uncomplicated: Secondary | ICD-10-CM | POA: Diagnosis not present

## 2014-05-25 DIAGNOSIS — Z859 Personal history of malignant neoplasm, unspecified: Secondary | ICD-10-CM | POA: Diagnosis not present

## 2014-05-25 DIAGNOSIS — F3289 Other specified depressive episodes: Secondary | ICD-10-CM | POA: Diagnosis not present

## 2014-05-25 DIAGNOSIS — I2510 Atherosclerotic heart disease of native coronary artery without angina pectoris: Secondary | ICD-10-CM | POA: Insufficient documentation

## 2014-05-25 DIAGNOSIS — IMO0002 Reserved for concepts with insufficient information to code with codable children: Secondary | ICD-10-CM | POA: Insufficient documentation

## 2014-05-25 DIAGNOSIS — Z85118 Personal history of other malignant neoplasm of bronchus and lung: Secondary | ICD-10-CM | POA: Diagnosis not present

## 2014-05-25 DIAGNOSIS — E039 Hypothyroidism, unspecified: Secondary | ICD-10-CM | POA: Diagnosis not present

## 2014-05-25 DIAGNOSIS — E119 Type 2 diabetes mellitus without complications: Secondary | ICD-10-CM | POA: Insufficient documentation

## 2014-05-25 DIAGNOSIS — J441 Chronic obstructive pulmonary disease with (acute) exacerbation: Secondary | ICD-10-CM | POA: Insufficient documentation

## 2014-05-25 DIAGNOSIS — E785 Hyperlipidemia, unspecified: Secondary | ICD-10-CM | POA: Diagnosis not present

## 2014-05-25 DIAGNOSIS — F329 Major depressive disorder, single episode, unspecified: Secondary | ICD-10-CM | POA: Insufficient documentation

## 2014-05-25 LAB — CBC WITH DIFFERENTIAL/PLATELET
BASOS ABS: 0 10*3/uL (ref 0.0–0.1)
Basophils Relative: 0 % (ref 0–1)
EOS ABS: 0.1 10*3/uL (ref 0.0–0.7)
Eosinophils Relative: 3 % (ref 0–5)
HCT: 36.2 % (ref 36.0–46.0)
Hemoglobin: 11.9 g/dL — ABNORMAL LOW (ref 12.0–15.0)
Lymphocytes Relative: 41 % (ref 12–46)
Lymphs Abs: 2.3 10*3/uL (ref 0.7–4.0)
MCH: 30.5 pg (ref 26.0–34.0)
MCHC: 32.9 g/dL (ref 30.0–36.0)
MCV: 92.8 fL (ref 78.0–100.0)
Monocytes Absolute: 0.5 10*3/uL (ref 0.1–1.0)
Monocytes Relative: 8 % (ref 3–12)
NEUTROS ABS: 2.7 10*3/uL (ref 1.7–7.7)
NEUTROS PCT: 48 % (ref 43–77)
Platelets: 283 10*3/uL (ref 150–400)
RBC: 3.9 MIL/uL (ref 3.87–5.11)
RDW: 13.4 % (ref 11.5–15.5)
WBC: 5.6 10*3/uL (ref 4.0–10.5)

## 2014-05-25 LAB — COMPREHENSIVE METABOLIC PANEL
ALBUMIN: 3.2 g/dL — AB (ref 3.5–5.2)
ALT: 8 U/L (ref 0–35)
ANION GAP: 12 (ref 5–15)
AST: 9 U/L (ref 0–37)
Alkaline Phosphatase: 108 U/L (ref 39–117)
BILIRUBIN TOTAL: 0.3 mg/dL (ref 0.3–1.2)
BUN: 10 mg/dL (ref 6–23)
CHLORIDE: 108 meq/L (ref 96–112)
CO2: 28 mEq/L (ref 19–32)
Calcium: 8.6 mg/dL (ref 8.4–10.5)
Creatinine, Ser: 1.09 mg/dL (ref 0.50–1.10)
GFR calc Af Amer: 53 mL/min — ABNORMAL LOW (ref >90)
GFR calc non Af Amer: 46 mL/min — ABNORMAL LOW (ref >90)
Glucose, Bld: 117 mg/dL — ABNORMAL HIGH (ref 70–99)
Potassium: 3.3 mEq/L — ABNORMAL LOW (ref 3.7–5.3)
Sodium: 148 mEq/L — ABNORMAL HIGH (ref 137–147)
Total Protein: 6 g/dL (ref 6.0–8.3)

## 2014-05-25 LAB — TROPONIN I: Troponin I: <0.3 ng/mL (ref <0.30)

## 2014-05-25 MED ORDER — IPRATROPIUM-ALBUTEROL 0.5-2.5 (3) MG/3ML IN SOLN
3.0000 mL | Freq: Once | RESPIRATORY_TRACT | Status: AC
  Administered 2014-05-25: 3 mL via RESPIRATORY_TRACT
  Filled 2014-05-25: qty 3

## 2014-05-25 MED ORDER — HYDROCODONE-ACETAMINOPHEN 5-325 MG PO TABS
2.0000 | ORAL_TABLET | Freq: Once | ORAL | Status: AC
Start: 2014-05-25 — End: 2014-05-25
  Administered 2014-05-25: 2 via ORAL
  Filled 2014-05-25: qty 2

## 2014-05-25 MED ORDER — PANTOPRAZOLE SODIUM 40 MG IV SOLR
40.0000 mg | Freq: Once | INTRAVENOUS | Status: AC
  Administered 2014-05-25: 40 mg via INTRAVENOUS
  Filled 2014-05-25: qty 40

## 2014-05-25 NOTE — ED Notes (Signed)
Productive cough since yesterday w/yellow and green sputum.  On recent hospitalization for neck surgery developed PNA and was treated.  Sent home on antibx.  Sat 98-100% on RA.  Feels like she can't exhale fully.  EMS reports wheezing upper lobes bilaterally.

## 2014-05-25 NOTE — Discharge Instructions (Signed)
Follow up with your md as planned °

## 2014-05-25 NOTE — ED Provider Notes (Signed)
CSN: [id number]     Arrival date & time 05/25/14  1510 History   First MD Initiated Contact with Patient 05/25/14 1603    This chart was scribed for Maudry Diego, MD by Girtha Hake, ED Scribe. The patient was seen in [place]. The patient's care was started at 4:08 PM.   Chief Complaint  Patient presents with  . Shortness of Breath    Patient is a 80 y.o. female presenting with shortness of breath. The history is provided by the patient. No language interpreter was used.  Shortness of Breath Severity:  Moderate Timing:  Constant Progression:  Unchanged Chronicity:  New Context comment:  Reports pain with expiration Associated symptoms: no abdominal pain, no chest pain, no cough, no headaches and no rash    HPI Comments: Madison Henson is a 80 y.o. female with a history of COPD who presents to the Emergency Department complaining of SOB. She reports associated pain with expiration, but denies pain with inspiration. Patient states that she took a breathing treatment approximately 30 minutes ago and has trouble exhaling since. She reports that she recently spent a week in the hospital but returned home 16 days ago. During her stay she was diagnosed with pneumonia.   PCP is Dr. Nevada Crane.    Past Medical History  Diagnosis Date  . Coronary artery disease     post diaphragmatic wall infarction on 06-2007,due to stent thrombosis   . Chronic obstructive pulmonary disease   . Tobacco abuse   . Hypertension   . Hyperlipidemia   . GERD (gastroesophageal reflux disease)   . Anxiety disorder   . Depression   . Hypothyroidism   . Hx of hysterectomy   . AAA (abdominal aortic aneurysm)   . Pulmonary nodule 04/2011    neg bx, followed by Dr. Arlyce Dice, may still be cancer, being followed with CTs.  . Esophageal dysmotility     Two BPE since 03/2011-->Moderate impairment of esophageal motility.Vallecular residuals. Laryngeal penetration  . Anemia   . Diabetes mellitus     with  gastroparesis 70% retention at 2 hours  . Cancer   . Lung cancer   . CHF (congestive heart failure)   . Myocardial infarction   . AAA (abdominal aortic aneurysm) without rupture 09/25/2013   Past Surgical History  Procedure Laterality Date  . Cholecystectomy    . Cataract surg    . Bladder surgery      tack  . Kidney stone surgery    . Coronary stent placement  2004    2  . Coronary stent placement  2008    2  . S/p hysterectomy    . Neck surgery    . Esophagogastroduodenoscopy  5/08    normal esophagus, No H.Pylori  . Egd/bravo  07/2008    On Nexium BID. Day 1 DMSTR Score: 0.7, day 2: DMSTR score: 32, uncontrolled GERD, No H. Pylori  . Hbt  2009    negative  . Colonoscopy  05/2010    tortuous sigm colon, sigm tics, multiple simple adenomas, hemorrhoids, next TCS 10-15 yrs per Dr. Oneida Alar. Previous h/o tubular adenomas in 2008.   Marland Kitchen Esophagogastroduodenoscopy  05/2010    small hh, streaky erythema in body/antrum. Duodenal bx negative.   . Esophagogastroduodenoscopy  06/22/2011    mild gastritis/ring in the distal esophagus, savary dilation. Bx reactive gastropathy, No H.pylori  . Savory dilation  06/22/2011    Procedure: SAVORY DILATION;  Surgeon: Dorothyann Peng, MD;  [facility];  Service: Endoscopy;  Laterality: N/A;  [name] dilation  06/22/2011    Procedure: [name] DILATION;  Surgeon: Dorothyann Peng, MD;  [facility]: [facility];  Service: Endoscopy;  Laterality: N/A;  . Video brochoscopy with electromagnetic navigaton  05/10/2011    [name]  . Right lower lobe superior segmentectomy  October 2012  . Hysterectomy at age 21    . Cardiac catheterization    . Abdominal hysterectomy    . Esophagogastroduodenoscopy N/A 08/14/2013    Procedure: ESOPHAGOGASTRODUODENOSCOPY (EGD);  Surgeon: Danie Binder, MD;  [facility]: [facility];  Service: Endoscopy;  Laterality: N/A;  2:45  . Colonoscopy N/A 11/02/2013    Procedure: COLONOSCOPY;  Surgeon: Danie Binder,  MD;  [facility];  Service: Endoscopy;  Laterality: N/A;  1:15  . Hemorrhoid banding N/A 11/02/2013    Procedure: HEMORRHOID BANDING;  Surgeon: Danie Binder, MD;  [facility];  Service: Endoscopy;  Laterality: N/A;  . Agile capsule N/A 11/12/2013    Procedure: AGILE CAPSULE;  Surgeon: Danie Binder, MD;  [facility];  Service: Endoscopy;  Laterality: N/A;  8:00  . Bacterial overgrowth test N/A 11/16/2013    Procedure: BACTERIAL OVERGROWTH TEST;  Surgeon: Danie Binder, MD;  [facility];  Service: Endoscopy;  Laterality: N/A;  8:00  . Wide local excision of skin lesion with complex reconstruction Left 04/26/2014    DR TAT  . Mass excision N/A 04/26/2014    Procedure: WIDE LOCAL EXCISION OF NECK SKIN LESION/IMMEDIATE SUBCUTANEOUS TISSUE WITH LOCAL RECONSTRUCTION;  Surgeon: Jerrell Belfast, MD;  Location: [facility] OR;  Service: ENT;  Laterality: N/A;   Family History  Problem Relation Age of Onset  . Coronary artery disease      family hx of  . Colon cancer Sister 48  . Colon cancer Daughter   . Prostate cancer      family hx of  . Lung cancer      family hx of  . Ovarian cancer      family hx of  . Arthritis      family hx of  . Other      family hx of chronic respiratory condition  . Heart disease Mother   . Stroke Mother    History  Substance Use Topics  . Smoking status: Current Every Day Smoker -- 0.50 packs/day for 40 years    Types: Cigarettes  . Smokeless tobacco: Never Used     Comment: She smokes 3 cigarettes daily  . Alcohol Use: No   OB History   Grav Para Term Preterm Abortions TAB SAB Ect Mult Living                 Review of Systems  Constitutional: Negative for appetite change and fatigue.  HENT: Negative for congestion, ear discharge and sinus pressure.   Eyes: Negative for discharge.  Respiratory: Positive for shortness of breath. Negative for cough.   Cardiovascular: Negative for chest pain.   Gastrointestinal: Negative for abdominal pain and diarrhea.  Genitourinary: Negative for frequency and hematuria.  Musculoskeletal: Negative for back pain.  Skin: Negative for rash.  Neurological: Negative for seizures and headaches.  Psychiatric/Behavioral: Negative for hallucinations.      Allergies  Linzess; Oxycodone; Sulfonamide derivatives; and Hydrocodone  Home Medications   Prior to Admission medications   Medication Sig Start Date End Date Taking? Authorizing Provider  albuterol (PROAIR HFA) 108 (90 BASE) MCG/ACT inhaler  Inhale 2 puffs into the lungs every 6 (six) hours as needed. For wheezing    Historical Provider, MD  ALPRAZolam Duanne Moron) 1 MG tablet Take 0.5-1 mg by mouth 2 (two) times daily. Take 0.5 mg in am and 1 mg in pm    Historical Provider, MD  amLODipine (NORVASC) 5 MG tablet Take 5 mg by mouth daily.    Historical Provider, MD  atorvastatin (LIPITOR) 20 MG tablet Take 20 mg by mouth at bedtime.     Historical Provider, MD  budesonide-formoterol (SYMBICORT) 160-4.5 MCG/ACT inhaler Inhale 2 puffs into the lungs 2 (two) times daily. 05/02/14   Debbe Odea, MD  clopidogrel (PLAVIX) 75 MG tablet Take 75 mg by mouth daily with breakfast.    Historical Provider, MD  cyanocobalamin (,VITAMIN B-12,) 1000 MCG/ML injection Inject 1,000 mcg into the muscle every 30 (thirty) days.    Historical Provider, MD  docusate sodium (COLACE) 100 MG capsule Take 100 mg by mouth daily as needed for mild constipation.    Historical Provider, MD  DULoxetine (CYMBALTA) 60 MG capsule Take 60 mg by mouth daily.    Historical Provider, MD  gabapentin (NEURONTIN) 300 MG capsule Take 300-600 mg by mouth 2 (two) times daily. Take 300mg  in the morning & 600mg  at bedtime 07/24/12   Historical Provider, MD  guaiFENesin (MUCINEX) 600 MG 12 hr tablet Take 1 tablet (600 mg total) by mouth 2 (two) times daily. 05/02/14   Debbe Odea, MD  HYDROcodone-acetaminophen (LORTAB) 10-500 MG per tablet Take 1 tablet  by mouth every 6 (six) hours as needed for pain.    Historical Provider, MD  hydrocortisone (ANUSOL-HC) 2.5 % rectal cream Place 1 application rectally 4 (four) times daily.    Historical Provider, MD  JANUVIA 50 MG tablet Take 50 mg by mouth daily.  07/04/12   Historical Provider, MD  levothyroxine (SYNTHROID, LEVOTHROID) 125 MCG tablet Take 125 mcg by mouth daily before breakfast.    Historical Provider, MD  nitroGLYCERIN (NITROSTAT) 0.4 MG SL tablet Place 0.4 mg under the tongue every 5 (five) minutes as needed for chest pain.    Historical Provider, MD  pantoprazole (PROTONIX) 40 MG tablet Take 40 mg by mouth 2 (two) times daily.     Historical Provider, MD  polyethylene glycol (MIRALAX / GLYCOLAX) packet Take 17 g by mouth daily. 12/26/13   Jasper Riling. Alvino Chapel, MD  predniSONE (DELTASONE) 10 MG tablet Take 6 tablets (60 mg total) by mouth daily with breakfast. Decrease by one tablet daily until gone 05/02/14   Debbe Odea, MD  Vitamin D, Ergocalciferol, (DRISDOL) 50000 UNITS CAPS capsule Take 50,000 Units by mouth every 7 (seven) days.    Historical Provider, MD   Triage Vitals: BP 101/74  Pulse 71  Temp(Src) 98.2 F (36.8 C)  Resp 20  Ht 5\' 8"  (1.727 m)  Wt 184 lb (83.462 kg)  BMI 27.98 kg/m2  SpO2 99% Physical Exam  Constitutional: She is oriented to person, place, and time. She appears well-developed.  HENT:  Head: Normocephalic.  Dry mucous membranes.  Eyes: Conjunctivae and EOM are normal. No scleral icterus.  Neck: Neck supple. No thyromegaly present.  Cardiovascular: Normal rate and regular rhythm.  Exam reveals no gallop and no friction rub.   No murmur heard. Pulmonary/Chest: No stridor. She has wheezes. She has no rales. She exhibits no tenderness.  Mild wheezing bilaterally.  Abdominal: She exhibits no distension. There is tenderness. There is no rebound.  Moderate epigastric tenderness.   Musculoskeletal:  Normal range of motion. She exhibits no edema.  Lymphadenopathy:     She has no cervical adenopathy.  Neurological: She is oriented to person, place, and time. She exhibits normal muscle tone. Coordination normal.  Skin: No rash noted. No erythema.  Psychiatric: She has a normal mood and affect. Her behavior is normal.    ED Course  Procedures (including critical care time) DIAGNOSTIC STUDIES: Oxygen Saturation is 99% on room air, normal by my interpretation.    COORDINATION OF CARE: 4:10 PM-Discussed treatment plan which includes Protonix, Duoneb, CXR, and labs with pt at bedside and pt agreed to plan.      Labs Review Labs Reviewed  CBC WITH DIFFERENTIAL  COMPREHENSIVE METABOLIC PANEL  TROPONIN I   Imaging Review No results found.   EKG Interpretation None      MDM   Final diagnoses:  None  pt with copd.  Improved with tx.  Follow up with pcp  The chart was scribed for me under my direct supervision.  I personally performed the history, physical, and medical decision making and all procedures in the evaluation of this patient.Maudry Diego, MD 05/25/14 [phone]

## 2014-05-25 NOTE — ED Notes (Signed)
Pt states pain is im[proving, pain med given prior to discharge, daughter driving [pt home.

## 2014-07-28 ENCOUNTER — Emergency Department (HOSPITAL_COMMUNITY): Payer: Medicare Other

## 2014-07-28 ENCOUNTER — Encounter (HOSPITAL_COMMUNITY): Payer: Self-pay | Admitting: Emergency Medicine

## 2014-07-28 ENCOUNTER — Emergency Department (HOSPITAL_COMMUNITY)
Admission: EM | Admit: 2014-07-28 | Discharge: 2014-07-28 | Disposition: A | Discharge status: Home / Self Care | Payer: Medicare Other | Attending: Emergency Medicine | Admitting: Emergency Medicine

## 2014-07-28 DIAGNOSIS — Z7902 Long term (current) use of antithrombotics/antiplatelets: Secondary | ICD-10-CM | POA: Insufficient documentation

## 2014-07-28 DIAGNOSIS — F3289 Other specified depressive episodes: Secondary | ICD-10-CM | POA: Diagnosis not present

## 2014-07-28 DIAGNOSIS — F411 Generalized anxiety disorder: Secondary | ICD-10-CM | POA: Diagnosis not present

## 2014-07-28 DIAGNOSIS — Z90710 Acquired absence of both cervix and uterus: Secondary | ICD-10-CM | POA: Insufficient documentation

## 2014-07-28 DIAGNOSIS — E785 Hyperlipidemia, unspecified: Secondary | ICD-10-CM | POA: Insufficient documentation

## 2014-07-28 DIAGNOSIS — K219 Gastro-esophageal reflux disease without esophagitis: Secondary | ICD-10-CM | POA: Diagnosis not present

## 2014-07-28 DIAGNOSIS — I252 Old myocardial infarction: Secondary | ICD-10-CM | POA: Diagnosis not present

## 2014-07-28 DIAGNOSIS — Z85118 Personal history of other malignant neoplasm of bronchus and lung: Secondary | ICD-10-CM | POA: Insufficient documentation

## 2014-07-28 DIAGNOSIS — I509 Heart failure, unspecified: Secondary | ICD-10-CM | POA: Insufficient documentation

## 2014-07-28 DIAGNOSIS — I10 Essential (primary) hypertension: Secondary | ICD-10-CM | POA: Diagnosis not present

## 2014-07-28 DIAGNOSIS — Z862 Personal history of diseases of the blood and blood-forming organs and certain disorders involving the immune mechanism: Secondary | ICD-10-CM | POA: Insufficient documentation

## 2014-07-28 DIAGNOSIS — R059 Cough, unspecified: Secondary | ICD-10-CM | POA: Insufficient documentation

## 2014-07-28 DIAGNOSIS — F329 Major depressive disorder, single episode, unspecified: Secondary | ICD-10-CM | POA: Insufficient documentation

## 2014-07-28 DIAGNOSIS — F17200 Nicotine dependence, unspecified, uncomplicated: Secondary | ICD-10-CM | POA: Diagnosis not present

## 2014-07-28 DIAGNOSIS — Z720 Tobacco use: Secondary | ICD-10-CM

## 2014-07-28 DIAGNOSIS — E119 Type 2 diabetes mellitus without complications: Secondary | ICD-10-CM | POA: Diagnosis not present

## 2014-07-28 DIAGNOSIS — R109 Unspecified abdominal pain: Secondary | ICD-10-CM | POA: Insufficient documentation

## 2014-07-28 DIAGNOSIS — Z8739 Personal history of other diseases of the musculoskeletal system and connective tissue: Secondary | ICD-10-CM | POA: Insufficient documentation

## 2014-07-28 DIAGNOSIS — Z79899 Other long term (current) drug therapy: Secondary | ICD-10-CM | POA: Diagnosis not present

## 2014-07-28 DIAGNOSIS — E039 Hypothyroidism, unspecified: Secondary | ICD-10-CM | POA: Diagnosis not present

## 2014-07-28 DIAGNOSIS — R05 Cough: Secondary | ICD-10-CM | POA: Insufficient documentation

## 2014-07-28 DIAGNOSIS — Z9861 Coronary angioplasty status: Secondary | ICD-10-CM | POA: Diagnosis not present

## 2014-07-28 LAB — URINALYSIS, ROUTINE W REFLEX MICROSCOPIC
Bilirubin Urine: NEGATIVE
Glucose, UA: NEGATIVE mg/dL
Hgb urine dipstick: NEGATIVE
Ketones, ur: NEGATIVE mg/dL
NITRITE: NEGATIVE
PH: 6.5 (ref 5.0–8.0)
Protein, ur: NEGATIVE mg/dL
SPECIFIC GRAVITY, URINE: 1.014 (ref 1.005–1.030)
UROBILINOGEN UA: 0.2 mg/dL (ref 0.0–1.0)

## 2014-07-28 LAB — CBC
HEMATOCRIT: 39.7 % (ref 36.0–46.0)
HEMOGLOBIN: 12.9 g/dL (ref 12.0–15.0)
MCH: 29.3 pg (ref 26.0–34.0)
MCHC: 32.5 g/dL (ref 30.0–36.0)
MCV: 90 fL (ref 78.0–100.0)
Platelets: 221 10*3/uL (ref 150–400)
RBC: 4.41 MIL/uL (ref 3.87–5.11)
RDW: 13 % (ref 11.5–15.5)
WBC: 7.1 10*3/uL (ref 4.0–10.5)

## 2014-07-28 LAB — BASIC METABOLIC PANEL
Anion gap: 11 (ref 5–15)
BUN: 11 mg/dL (ref 6–23)
CHLORIDE: 104 meq/L (ref 96–112)
CO2: 27 meq/L (ref 19–32)
Calcium: 9.1 mg/dL (ref 8.4–10.5)
Creatinine, Ser: 1.03 mg/dL (ref 0.50–1.10)
GFR calc Af Amer: 56 mL/min — ABNORMAL LOW (ref >90)
GFR, EST NON AFRICAN AMERICAN: 49 mL/min — AB (ref >90)
Glucose, Bld: 101 mg/dL — ABNORMAL HIGH (ref 70–99)
POTASSIUM: 3.9 meq/L (ref 3.7–5.3)
SODIUM: 142 meq/L (ref 137–147)

## 2014-07-28 LAB — I-STAT TROPONIN, ED: TROPONIN I, POC: 0 ng/mL (ref 0.00–0.08)

## 2014-07-28 LAB — URINE MICROSCOPIC-ADD ON

## 2014-07-28 MED ORDER — HYDROCODONE-ACETAMINOPHEN 5-325 MG PO TABS
1.0000 | ORAL_TABLET | Freq: Once | ORAL | Status: AC
  Administered 2014-07-28: 1 via ORAL
  Filled 2014-07-28: qty 1

## 2014-07-28 MED ORDER — SODIUM CHLORIDE 0.9 % IV SOLN
INTRAVENOUS | Status: DC
  Administered 2014-07-28: 12:00:00 via INTRAVENOUS

## 2014-07-28 NOTE — ED Provider Notes (Signed)
CSN: [id number]     Arrival date & time 07/28/14  1105 History   First MD Initiated Contact with Patient 07/28/14 1109     Chief Complaint  Patient presents with  . Abdominal Pain  . Cough     (Consider location/radiation/quality/duration/timing/severity/associated sxs/prior Treatment) HPI  Madison Henson is a 80 y.o. female presents for evaluation of left flank pain that has been present for 2 days and radiates to her left lower chest wall. She has had cough that is productive, cough, green sputum. She denies shortness of breath, fever, chills, nausea, vomiting, weakness, or dizziness. She has had urinary frequency, and dysuria, but no hematuria. There are no other known modifying factors. She is taking her usual medications, without relief.   Past Medical History  Diagnosis Date  . Coronary artery disease     post diaphragmatic wall infarction on 06-2007,due to stent thrombosis   . Chronic obstructive pulmonary disease   . Tobacco abuse   . Hypertension   . Hyperlipidemia   . GERD (gastroesophageal reflux disease)   . Anxiety disorder   . Depression   . Hypothyroidism   . Hx of hysterectomy   . AAA (abdominal aortic aneurysm)   . Pulmonary nodule 04/2011    neg bx, followed by Dr. Arlyce Dice, may still be cancer, being followed with CTs.  . Esophageal dysmotility     Two BPE since 03/2011-->Moderate impairment of esophageal motility.Vallecular residuals. Laryngeal penetration  . Anemia   . Diabetes mellitus     with gastroparesis 70% retention at 2 hours  . Cancer   . Lung cancer   . CHF (congestive heart failure)   . Myocardial infarction   . AAA (abdominal aortic aneurysm) without rupture 09/25/2013   Past Surgical History  Procedure Laterality Date  . Cholecystectomy    . Cataract surg    . Bladder surgery      tack  . Kidney stone surgery    . Coronary stent placement  2004    2  . Coronary stent placement  2008    2  . S/p hysterectomy    . Neck surgery    .  Esophagogastroduodenoscopy  5/08    normal esophagus, No H.Pylori  . Egd/bravo  07/2008    On Nexium BID. Day 1 DMSTR Score: 0.7, day 2: DMSTR score: 32, uncontrolled GERD, No H. Pylori  . Hbt  2009    negative  . Colonoscopy  05/2010    tortuous sigm colon, sigm tics, multiple simple adenomas, hemorrhoids, next TCS 10-15 yrs per Dr. Oneida Alar. Previous h/o tubular adenomas in 2008.   Marland Kitchen Esophagogastroduodenoscopy  05/2010    small hh, streaky erythema in body/antrum. Duodenal bx negative.   . Esophagogastroduodenoscopy  06/22/2011    mild gastritis/ring in the distal esophagus, savary dilation. Bx reactive gastropathy, No H.pylori  . Savory dilation  06/22/2011    Procedure: SAVORY DILATION;  Surgeon: Dorothyann Peng, MD;  [facility];  Service: Endoscopy;  Laterality: N/A;  [name] dilation  06/22/2011    Procedure: [name] DILATION;  Surgeon: Dorothyann Peng, MD;  [facility];  Service: Endoscopy;  Laterality: N/A;  . Video brochoscopy with electromagnetic navigaton  05/10/2011    [name]  . Right lower lobe superior segmentectomy  October 2012  . Hysterectomy at age 84    . Cardiac catheterization    . Abdominal hysterectomy    . Esophagogastroduodenoscopy N/A 08/14/2013    Procedure: ESOPHAGOGASTRODUODENOSCOPY (EGD);  Surgeon: Danie Binder, MD;  [facility];  Service: Endoscopy;  Laterality: N/A;  2:45  . Colonoscopy N/A 11/02/2013    Procedure: COLONOSCOPY;  Surgeon: Danie Binder, MD;  [facility];  Service: Endoscopy;  Laterality: N/A;  1:15  . Hemorrhoid banding N/A 11/02/2013    Procedure: HEMORRHOID BANDING;  Surgeon: Danie Binder, MD;  [facility];  Service: Endoscopy;  Laterality: N/A;  . Agile capsule N/A 11/12/2013    Procedure: AGILE CAPSULE;  Surgeon: Danie Binder, MD;  [facility];  Service: Endoscopy;  Laterality: N/A;  8:00  . Bacterial overgrowth test N/A 11/16/2013    Procedure: BACTERIAL  OVERGROWTH TEST;  Surgeon: Danie Binder, MD;  [facility];  Service: Endoscopy;  Laterality: N/A;  8:00  . Wide local excision of skin lesion with complex reconstruction Left 04/26/2014    DR TAT  . Mass excision N/A 04/26/2014    Procedure: WIDE LOCAL EXCISION OF NECK SKIN LESION/IMMEDIATE SUBCUTANEOUS TISSUE WITH LOCAL RECONSTRUCTION;  Surgeon: Jerrell Belfast, MD;  Location: [facility] OR;  Service: ENT;  Laterality: N/A;   Family History  Problem Relation Age of Onset  . Coronary artery disease      family hx of  . Colon cancer Sister 80  . Colon cancer Daughter   . Prostate cancer      family hx of  . Lung cancer      family hx of  . Ovarian cancer      family hx of  . Arthritis      family hx of  . Other      family hx of chronic respiratory condition  . Heart disease Mother   . Stroke Mother    History  Substance Use Topics  . Smoking status: Current Every Day Smoker -- 0.50 packs/day for 40 years    Types: Cigarettes  . Smokeless tobacco: Never Used     Comment: She smokes 3 cigarettes daily  . Alcohol Use: No   OB History   Grav Para Term Preterm Abortions TAB SAB Ect Mult Living                 Review of Systems  All other systems reviewed and are negative.     Allergies  Linzess; Oxycodone; Sulfonamide derivatives; and Hydrocodone  Home Medications   Prior to Admission medications   Medication Sig Start Date End Date Taking? Authorizing Provider  albuterol (PROAIR HFA) 108 (90 BASE) MCG/ACT inhaler Inhale 2 puffs into the lungs every 6 (six) hours as needed for wheezing.    Yes Historical Provider, MD  albuterol (PROVENTIL) (2.5 MG/3ML) 0.083% nebulizer solution Take 2.5 mg by nebulization every 4 (four) hours as needed for wheezing or shortness of breath.   Yes Historical Provider, MD  ALPRAZolam Duanne Moron) 1 MG tablet Take 0.5-1 mg by mouth 2 (two) times daily. Take 0.5 mg in am and 1 mg in pm   Yes Historical Provider, MD  amLODipine (NORVASC) 5  MG tablet Take 5 mg by mouth daily.   Yes Historical Provider, MD  atorvastatin (LIPITOR) 20 MG tablet Take 20 mg by mouth daily.    Yes Historical Provider, MD  budesonide-formoterol (SYMBICORT) 160-4.5 MCG/ACT inhaler Inhale 2 puffs into the lungs 2 (two) times daily. 05/02/14  Yes Debbe Odea, MD  clopidogrel (PLAVIX) 75 MG tablet Take 75 mg by mouth daily with breakfast.   Yes Historical Provider, MD  cyanocobalamin (,VITAMIN  B-12,) 1000 MCG/ML injection Inject 1,000 mcg into the muscle every 30 (thirty) days.   Yes Historical Provider, MD  docusate sodium (COLACE) 100 MG capsule Take 100 mg by mouth daily as needed for mild constipation.   Yes Historical Provider, MD  DULoxetine (CYMBALTA) 60 MG capsule Take 60 mg by mouth daily.   Yes Historical Provider, MD  gabapentin (NEURONTIN) 300 MG capsule Take 300-600 mg by mouth 2 (two) times daily. Take 300mg  in the morning & 600mg  at bedtime 07/24/12  Yes Historical Provider, MD  HYDROcodone-acetaminophen (NORCO) 10-325 MG per tablet Take 1 tablet by mouth every 6 (six) hours as needed. pain   Yes Historical Provider, MD  HYDROmorphone (DILAUDID) 2 MG tablet Take 2 mg by mouth every 6 (six) hours as needed for severe pain.  07/20/14  Yes Historical Provider, MD  JANUVIA 50 MG tablet Take 50 mg by mouth daily.  07/04/12  Yes Historical Provider, MD  levothyroxine (SYNTHROID, LEVOTHROID) 125 MCG tablet Take 125 mcg by mouth daily before breakfast.   Yes Historical Provider, MD  pantoprazole (PROTONIX) 40 MG tablet Take 40 mg by mouth 2 (two) times daily.    Yes Historical Provider, MD  polyethylene glycol (MIRALAX / GLYCOLAX) packet Take 17 g by mouth daily. 12/26/13  Yes Jasper Riling. [place], MD  nitroGLYCERIN (NITROSTAT) 0.4 MG SL tablet Place 0.4 mg under the tongue every 5 (five) minutes as needed for chest pain.    Historical Provider, MD   BP 109/64  Pulse 61  Temp(Src) 98 F (36.7 C) (Oral)  Resp 20  SpO2 99% Physical Exam  Nursing note and  vitals reviewed. Constitutional: She is oriented to person, place, and time. She appears well-developed.  Elderly, frail  HENT:  Head: Normocephalic and atraumatic.  Eyes: Conjunctivae and EOM are normal. Pupils are equal, round, and reactive to light.  Neck: Normal range of motion and phonation normal. Neck supple.  Cardiovascular: Normal rate, regular rhythm and intact distal pulses.   Pulmonary/Chest: Effort normal. No respiratory distress. She has no rales. She exhibits no tenderness.  Decreased air movement bilaterally with scattered wheezes.  Abdominal: Soft. She exhibits no distension. There is no tenderness. There is no guarding.  Musculoskeletal: Normal range of motion.  Neurological: She is alert and oriented to person, place, and time. She exhibits normal muscle tone.  Skin: Skin is warm and dry.  Psychiatric: She has a normal mood and affect. Her behavior is normal. Judgment and thought content normal.    ED Course  Procedures (including critical care time) Medications  0.9 %  sodium chloride infusion ( Intravenous Stopped 07/28/14 1602)  HYDROcodone-acetaminophen (NORCO/VICODIN) 5-325 MG per tablet 1 tablet (1 tablet Oral Given 07/28/14 1213)    Patient Vitals for the past 24 hrs:  BP Temp Temp src Pulse Resp SpO2  07/28/14 1554 - 98 F (36.7 C) Oral - - -  07/28/14 1531 109/64 mmHg - - 61 20 99 %  07/28/14 1445 130/65 mmHg - - 59 - 98 %  07/28/14 1430 121/68 mmHg - - 64 - 99 %  07/28/14 1400 115/65 mmHg - - 64 - 99 %  07/28/14 1351 134/75 mmHg - - - 18 100 %  07/28/14 1345 134/75 mmHg - - 63 - 100 %  07/28/14 1322 - - - 64 - 100 %  07/28/14 1321 107/88 mmHg - - - - -  07/28/14 1215 142/63 mmHg - - 62 18 98 %  07/28/14 1207 150/65 mmHg - - -  22 98 %  07/28/14 1116 - - - - - 99 %  07/28/14 1115 160/75 mmHg 98.2 F (36.8 C) Oral 82 20 98 %  07/28/14 1110 - 98.2 F (36.8 C) Oral 76 18 -  07/28/14 1108 - - - - - 98 %    3:37 PM Reevaluation with update and  discussion. After initial assessment and treatment, an updated evaluation reveals no further c/o. [place] Review Labs Reviewed  BASIC METABOLIC PANEL - Abnormal; Notable for the following:    Glucose, Bld 101 (*)    GFR calc non Af Amer 49 (*)    GFR calc Af Amer 56 (*)    All other components within normal limits  URINALYSIS, ROUTINE W REFLEX MICROSCOPIC - Abnormal; Notable for the following:    APPearance CLOUDY (*)    Leukocytes, UA TRACE (*)    All other components within normal limits  URINE MICROSCOPIC-ADD ON - Abnormal; Notable for the following:    Squamous Epithelial / LPF FEW (*)    All other components within normal limits  URINE CULTURE  CBC  I-STAT TROPOININ, ED    Imaging Review Dg Chest 2 View  07/28/2014   CLINICAL DATA:  Abdominal pain and cough.  Smoker.  EXAM: CHEST  2 VIEW  COMPARISON:  CT chest 04/27/2014 and chest radiograph 05/25/2014  FINDINGS: Stable mild cardiomegaly. Mildly tortuous thoracic aorta contour with associated atherosclerotic calcification, stable. Surgical clips in the right hilum. Normal lung volumes. Minimal atelectasis or scar at both lung bases. No consolidative airspace disease, pulmonary edema, or pleural effusion. Negative for pneumothorax. The bones are osteopenic. On the lateral view, artifact from the patient's gown/clothing noted.  IMPRESSION: Stable cardiomegaly and postsurgical changes in the right hilum. Minimal scar versus atelectasis at both lung bases. Otherwise, no acute findings in the chest.   Electronically Signed   By: Curlene Dolphin M.D.   On: 07/28/2014 12:25   Dg Ribs Unilateral Left  07/28/2014   CLINICAL DATA:  Pain in the anterior rib region on the left.  EXAM: LEFT RIBS - 2 VIEW  COMPARISON:  Chest radiograph 07/28/2014  FINDINGS: No fracture or other bone lesions are seen involving the ribs. No acute findings identified in the left lung visualized portion of the right lung.  IMPRESSION: No acute left rib  fracture identified.   Electronically Signed   By: Curlene Dolphin M.D.   On: 07/28/2014 13:24     EKG Interpretation   Date/Time:  Sunday July 28 2014 11:10:43 EDT Ventricular Rate:  80 PR Interval:  189 QRS Duration: 144 QT Interval:  412 QTC Calculation: 475 R Axis:   -28 Text Interpretation:  Sinus rhythm Left bundle branch block since last  tracing no significant change Confirmed by Eulis Foster  MD, Vira Agar [phone]) on  07/28/2014 3:42:43 PM      MDM   Final diagnoses:  Flank pain  Tobacco abuse    Nonspecific left flank pain, without evidence for urinary tract infection, urolithiasis, pneumothorax, pneumonia or ACS   Nursing Notes Reviewed/ Care Coordinated Applicable Imaging Reviewed Interpretation of Laboratory Data incorporated into ED treatment  The patient appears reasonably screened and/or stabilized for discharge and I doubt any other medical condition or other [facility] requiring further screening, evaluation, or treatment in the ED at this time prior to discharge.  Plan: Home Medications- usual; Home Treatments- rest, heat to sore area; return here if the recommended treatment, does not improve the symptoms; Recommended  follow up- rest  Richarda Blade, MD 07/28/14 1754

## 2014-07-28 NOTE — ED Notes (Signed)
Per [facility], pt from home, pt c/o LUQ pain and left flank pain x2 days. Pt c/o persistant cough x3 days. Hx of pneumonia, COPD, coughing up green mucous. No fever, no N/V/D, denies SOB. Pt also complains of frequency and burning with urination. Pt is AAOX4, in NAD

## 2014-07-28 NOTE — ED Notes (Signed)
EMS EKG unremarkable. Hx of 4 stents, last stent was 2008. LUQ tenderness to palpation.

## 2014-07-28 NOTE — Discharge Instructions (Signed)
Use heat to sore areas, 3 or 4 times a day, for several days. Try to to stop smoking.   Flank Pain Flank pain refers to pain that is located on the side of the body between the upper abdomen and the back. The pain may occur over a short period of time (acute) or may be long-term or reoccurring (chronic). It may be mild or severe. Flank pain can be caused by many things. CAUSES  Some of the more common causes of flank pain include:  Muscle strains.   Muscle spasms.   A disease of your spine (vertebral disk disease).   A lung infection (pneumonia).   Fluid around your lungs (pulmonary edema).   A kidney infection.   Kidney stones.   A very painful skin rash caused by the chickenpox virus (shingles).   Gallbladder disease.  [place] care will depend on the cause of your pain. In general,  Rest as directed by your caregiver.  Drink enough fluids to keep your urine clear or pale yellow.  Only take over-the-counter or prescription medicines as directed by your caregiver. Some medicines may help relieve the pain.  Tell your caregiver about any changes in your pain.  Follow up with your caregiver as directed. SEEK IMMEDIATE MEDICAL CARE IF:   Your pain is not controlled with medicine.   You have new or worsening symptoms.  Your pain increases.   You have abdominal pain.   You have shortness of breath.   You have persistent nausea or vomiting.   You have swelling in your abdomen.   You feel faint or pass out.   You have blood in your urine.  You have a fever or persistent symptoms for more than 2-3 days.  You have a fever and your symptoms suddenly get worse. MAKE SURE YOU:   Understand these instructions.  Will watch your condition.  Will get help right away if you are not doing well or get worse. Document Released: 12/23/2005 Document Revised: 07/26/2012 Document Reviewed: 06/15/2012 [facility] Patient Information 2015  [place], [place]. This information is not intended to replace advice given to you by your health care provider. Make sure you discuss any questions you have with your health care provider.  Smoking Cessation Quitting smoking is important to your health and has many advantages. However, it is not always easy to quit since nicotine is a very addictive drug. Oftentimes, people try 3 times or more before being able to quit. This document explains the best ways for you to prepare to quit smoking. Quitting takes hard work and a lot of effort, but you can do it. ADVANTAGES OF QUITTING SMOKING  You will live longer, feel better, and live better.  Your body will feel the impact of quitting smoking almost immediately.  Within 20 minutes, blood pressure decreases. Your pulse returns to its normal level.  After 8 hours, carbon monoxide levels in the blood return to normal. Your oxygen level increases.  After 24 hours, the chance of having a heart attack starts to decrease. Your breath, hair, and body stop smelling like smoke.  After 48 hours, damaged nerve endings begin to recover. Your sense of taste and smell improve.  After 72 hours, the body is virtually free of nicotine. Your bronchial tubes relax and breathing becomes easier.  After 2 to 12 weeks, lungs can hold more air. Exercise becomes easier and circulation improves.  The risk of having a heart attack, stroke, cancer, or lung disease  is greatly reduced.  After 1 year, the risk of coronary heart disease is cut in half.  After 5 years, the risk of stroke falls to the same as a nonsmoker.  After 10 years, the risk of lung cancer is cut in half and the risk of other cancers decreases significantly.  After 15 years, the risk of coronary heart disease drops, usually to the level of a nonsmoker.  If you are pregnant, quitting smoking will improve your chances of having a healthy baby.  The people you live with, especially any children, will be  healthier.  You will have extra money to spend on things other than cigarettes. QUESTIONS TO THINK ABOUT BEFORE ATTEMPTING TO QUIT You may want to talk about your answers with your health care provider.  Why do you want to quit?  If you tried to quit in the past, what helped and what did not?  What will be the most difficult situations for you after you quit? How will you plan to handle them?  Who can help you through the tough times? Your family? Friends? A health care provider?  What pleasures do you get from smoking? What ways can you still get pleasure if you quit? Here are some questions to ask your health care provider:  How can you help me to be successful at quitting?  What medicine do you think would be best for me and how should I take it?  What should I do if I need more help?  What is smoking withdrawal like? How can I get information on withdrawal? GET READY  Set a quit date.  Change your environment by getting rid of all cigarettes, ashtrays, matches, and lighters in your home, car, or work. Do not let people smoke in your home.  Review your past attempts to quit. Think about what worked and what did not. GET SUPPORT AND ENCOURAGEMENT You have a better chance of being successful if you have help. You can get support in many ways.  Tell your family, friends, and coworkers that you are going to quit and need their support. Ask them not to smoke around you.  Get individual, group, or telephone counseling and support. Programs are available at [employer] and health centers. Call your local health department for information about programs in your area.  Spiritual beliefs and practices may help some smokers quit.  Download a "quit meter" on your computer to keep track of quit statistics, such as how long you have gone without smoking, cigarettes not smoked, and money saved.  Get a self-help book about quitting smoking and staying off tobacco. Tolu yourself from urges to smoke. Talk to someone, go for a walk, or occupy your time with a task.  Change your normal routine. Take a different route to work. Drink tea instead of coffee. Eat breakfast in a different place.  Reduce your stress. Take a hot bath, exercise, or read a book.  Plan something enjoyable to do every day. Reward yourself for not smoking.  Explore interactive web-based programs that specialize in helping you quit. GET MEDICINE AND USE IT CORRECTLY Medicines can help you stop smoking and decrease the urge to smoke. Combining medicine with the above behavioral methods and support can greatly increase your chances of successfully quitting smoking.  Nicotine replacement therapy helps deliver nicotine to your body without the negative effects and risks of smoking. Nicotine replacement therapy includes nicotine gum, lozenges, inhalers, nasal sprays, and  skin patches. Some may be available over-the-counter and others require a prescription.  Antidepressant medicine helps people abstain from smoking, but how this works is unknown. This medicine is available by prescription.  Nicotinic receptor partial agonist medicine simulates the effect of nicotine in your brain. This medicine is available by prescription. Ask your health care provider for advice about which medicines to use and how to use them based on your health history. Your health care provider will tell you what side effects to look out for if you choose to be on a medicine or therapy. Carefully read the information on the package. Do not use any other product containing nicotine while using a nicotine replacement product.  RELAPSE OR DIFFICULT SITUATIONS Most relapses occur within the first 3 months after quitting. Do not be discouraged if you start smoking again. Remember, most people try several times before finally quitting. You may have symptoms of withdrawal because your body is used to nicotine.  You may crave cigarettes, be irritable, feel very hungry, cough often, get headaches, or have difficulty concentrating. The withdrawal symptoms are only temporary. They are strongest when you first quit, but they will go away within 10-14 days. To reduce the chances of relapse, try to:  Avoid drinking alcohol. Drinking lowers your chances of successfully quitting.  Reduce the amount of caffeine you consume. Once you quit smoking, the amount of caffeine in your body increases and can give you symptoms, such as a rapid heartbeat, sweating, and anxiety.  Avoid smokers because they can make you want to smoke.  Do not let weight gain distract you. Many smokers will gain weight when they quit, usually less than 10 pounds. Eat a healthy diet and stay active. You can always lose the weight gained after you quit.  Find ways to improve your mood other than smoking. FOR MORE INFORMATION  www.smokefree.gov  Document Released: 10/26/2001 Document Revised: 03/18/2014 Document Reviewed: 02/10/2012 [facility] Patient Information 2015 [place], [place]. This information is not intended to replace advice given to you by your health care provider. Make sure you discuss any questions you have with your health care provider.  Smoking Hazards Smoking cigarettes is extremely bad for your health. Tobacco smoke has over 200 known poisons in it. It contains the poisonous gases nitrogen oxide and carbon monoxide. There are over 60 chemicals in tobacco smoke that cause cancer. Some of the chemicals found in cigarette smoke include:   Cyanide.   Benzene.   Formaldehyde.   Methanol (wood alcohol).   Acetylene (fuel used in welding torches).   Ammonia.  Even smoking lightly shortens your life expectancy by several years. You can greatly reduce the risk of medical problems for you and your family by stopping now. Smoking is the most preventable cause of death and disease in our society. Within days of quitting  smoking, your circulation improves, you decrease the risk of having a heart attack, and your lung capacity improves. There may be some increased phlegm in the first few days after quitting, and it may take months for your lungs to clear up completely. Quitting for 10 years reduces your risk of developing lung cancer to almost that of a nonsmoker.  WHAT ARE THE RISKS OF SMOKING? Cigarette smokers have an increased risk of many serious medical problems, including:  Lung cancer.   Lung disease (such as pneumonia, bronchitis, and emphysema).   Heart attack and chest pain due to the heart not getting enough oxygen (angina).   Heart disease and peripheral  blood vessel disease.   Hypertension.   Stroke.   Oral cancer (cancer of the lip, mouth, or voice box).   Bladder cancer.   Pancreatic cancer.   Cervical cancer.   Pregnancy complications, including premature birth.   Stillbirths and smaller newborn babies, birth defects, and genetic damage to sperm.   Early menopause.   Lower estrogen level for women.   Infertility.   Facial wrinkles.   Blindness.   Increased risk of broken bones (fractures).   Senile dementia.   Stomach ulcers and internal bleeding.   Delayed wound healing and increased risk of complications during surgery. Because of secondhand smoke exposure, children of smokers have an increased risk of the following:   Sudden infant death syndrome (SIDS).   Respiratory infections.   Lung cancer.   Heart disease.   Ear infections.  WHY IS SMOKING ADDICTIVE? Nicotine is the chemical agent in tobacco that is capable of causing addiction or dependence. When you smoke and inhale, nicotine is absorbed rapidly into the bloodstream through your lungs. Both inhaled and noninhaled nicotine may be addictive.  WHAT ARE THE BENEFITS OF QUITTING?  There are many health benefits to quitting smoking. Some are:   The likelihood of developing  cancer and heart disease decreases. Health improvements are seen almost immediately.   Blood pressure, pulse rate, and breathing patterns start returning to normal soon after quitting.   People who quit may see an improvement in their overall quality of life.  HOW DO YOU QUIT SMOKING? Smoking is an addiction with both physical and psychological effects, and longtime habits can be hard to change. Your health care provider can recommend:  Programs and community resources, which may include group support, education, or therapy.  Replacement products, such as patches, gum, and nasal sprays. Use these products only as directed. Do not replace cigarette smoking with electronic cigarettes (commonly called e-cigarettes). The safety of e-cigarettes is unknown, and some may contain harmful chemicals. FOR MORE INFORMATION  American Lung Association: www.lung.org  American Cancer Society: www.cancer.org Document Released: 12/09/2004 Document Revised: 08/22/2013 Document Reviewed: 04/23/2013 [facility] Patient Information 2015 [place], [place]. This information is not intended to replace advice given to you by your health care provider. Make sure you discuss any questions you have with your health care provider.

## 2014-07-30 LAB — URINE CULTURE: Special Requests: NORMAL

## 2014-08-19 ENCOUNTER — Ambulatory Visit (INDEPENDENT_AMBULATORY_CARE_PROVIDER_SITE_OTHER): Payer: Medicare Other | Admitting: Cardiology

## 2014-08-19 ENCOUNTER — Encounter: Payer: Self-pay | Admitting: Cardiology

## 2014-08-19 VITALS — BP 116/66 | HR 59 | Ht 68.0 in | Wt 197.0 lb

## 2014-08-19 DIAGNOSIS — M48061 Spinal stenosis, lumbar region without neurogenic claudication: Secondary | ICD-10-CM | POA: Insufficient documentation

## 2014-08-19 DIAGNOSIS — M1990 Unspecified osteoarthritis, unspecified site: Secondary | ICD-10-CM | POA: Insufficient documentation

## 2014-08-19 DIAGNOSIS — E0590 Thyrotoxicosis, unspecified without thyrotoxic crisis or storm: Secondary | ICD-10-CM | POA: Insufficient documentation

## 2014-08-19 DIAGNOSIS — K219 Gastro-esophageal reflux disease without esophagitis: Secondary | ICD-10-CM | POA: Insufficient documentation

## 2014-08-19 DIAGNOSIS — I2510 Atherosclerotic heart disease of native coronary artery without angina pectoris: Secondary | ICD-10-CM

## 2014-08-19 DIAGNOSIS — E785 Hyperlipidemia, unspecified: Secondary | ICD-10-CM | POA: Insufficient documentation

## 2014-08-19 NOTE — Patient Instructions (Addendum)
Your physician wants you to follow-up in: 6 months with Dr. McAlhany.  You will receive a reminder letter in the mail two months in advance. If you don't receive a letter, please call our office to schedule the follow-up appointment.  

## 2014-08-19 NOTE — Progress Notes (Signed)
Patient ID: Madison Henson, female   DOB: 1928-10-31, 80 y.o.   MRN: [id number]    08/19/2014 Madison Henson   1928-04-11  [id number]  Primary Physicia Madison Cahill, MD Primary Cardiologist: Dr. Angelena Henson  HPI:  [name] presents to clinic today of routine 6 month follow-up. She is followed by Dr. Angelena Henson. She is a 80 yo WF with history of CAD, HTN, Hyperlipidemia, DM, GERD, anxiety/depression, and long term tobacco abuse (averages 3 cigarettes/day) here today for cardiac follow up. 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 A new DES was also placed at that time. She previously had a Cypher stent in the circumflex artery as well. Event monitor several years ago dmonstrated isolated PVCs and PACs. She has chronic shortness of breath related to her COPD. She had an MRI in June 2011 which showed a 3.6 x 3.6 cm infrarenal aortic aneurysm. This is followed by Dr. Oneida Henson. She has chronic back pain felt to be secondary lumbar disc bulging but not felt to be a surgical candidate. She also has esophageal strictures and had an esophageal dilatation in 2012. She has been on Simvastatin for years. Her lipids are followed in primary care. Last cardiac cath 07/09/11 which showed mild to moderate stable CAD. She had a right lower lobectomy for stage IA non-small cell lung cancer per Dr. Arlyce Henson in October 2012. She has been on Plavix chronically with first generation DES. Her ASA was stopped in 2013. Her last office visit with Dr. Angelena Henson was in May of this year. She presented at that time for routine f/u and for pre-operative assessment/clearance for removal of a neck cancer. Dr. Angelena Henson order for her to undergo evaluation by nuclear stress testing. This was negative for ischemia and she was cleared for surgery. Per the patient, her tumor was successfully extracted and she has not required any additional therapies.   She presents back to clinic today  for f/u. She denies any major change since her last OV. She continues to smoke ~3 cigarettes daily. She denies any anginal symptoms. She does not localized, atypical chest/mid-epigastric pain just distal to her breast bone. Described as sharp. Mildly pleuritic. Worse with movement/ changes in position. Reproducible with palpation. No associated dyspnea. Symptoms are unlike her prior anginal symptoms. She reports full medication compliance including daily use of Plavix.   Today in clinic, her pain is reproducible with palpation of the epigastrium. Her EKG is unchanged compared to prior. HR is stable at 59 bpm. BP is stable at 116/66.  Current Outpatient Prescriptions  Medication Sig Dispense Refill  . albuterol (PROAIR HFA) 108 (90 BASE) MCG/ACT inhaler Inhale 2 puffs into the lungs every 6 (six) hours as needed for wheezing.       Marland Kitchen albuterol (PROVENTIL) (2.5 MG/3ML) 0.083% nebulizer solution Take 2.5 mg by nebulization every 4 (four) hours as needed for wheezing or shortness of breath.      . ALPRAZolam (XANAX) 1 MG tablet Take 0.5-1 mg by mouth 2 (two) times daily. Take 0.5 mg in am and 1 mg in pm      . amLODipine (NORVASC) 5 MG tablet Take 5 mg by mouth daily.      Marland Kitchen atorvastatin (LIPITOR) 20 MG tablet Take 20 mg by mouth daily.       . budesonide-formoterol (SYMBICORT) 160-4.5 MCG/ACT inhaler Inhale 2 puffs into the lungs 2 (two) times daily.  1 Inhaler  0  . clopidogrel (  PLAVIX) 75 MG tablet Take 75 mg by mouth daily with breakfast.      . cyanocobalamin (,VITAMIN B-12,) 1000 MCG/ML injection Inject 1,000 mcg into the muscle every 30 (thirty) days.      Marland Kitchen docusate sodium (COLACE) 100 MG capsule Take 100 mg by mouth daily as needed for mild constipation.      . DULoxetine (CYMBALTA) 60 MG capsule Take 60 mg by mouth daily.      Marland Kitchen gabapentin (NEURONTIN) 300 MG capsule Take 300-600 mg by mouth 2 (two) times daily. Take 300mg  in the morning & 600mg  at bedtime      . HYDROcodone-acetaminophen  (NORCO) 10-325 MG per tablet Take 1 tablet by mouth every 6 (six) hours as needed. pain      . HYDROmorphone (DILAUDID) 2 MG tablet Take 2 mg by mouth every 6 (six) hours as needed for severe pain.       Marland Kitchen JANUVIA 50 MG tablet Take 50 mg by mouth daily.       Marland Kitchen levothyroxine (SYNTHROID, LEVOTHROID) 125 MCG tablet Take 125 mcg by mouth daily before breakfast.      . nitroGLYCERIN (NITROSTAT) 0.4 MG SL tablet Place 0.4 mg under the tongue every 5 (five) minutes as needed for chest pain.      . pantoprazole (PROTONIX) 40 MG tablet Take 40 mg by mouth 2 (two) times daily.       . polyethylene glycol (MIRALAX / GLYCOLAX) packet Take 17 g by mouth daily.  14 each  0   No current facility-administered medications for this visit.    Allergies  Allergen Reactions  . Linzess [Linaclotide] Other (See Comments)    EXPLOSIVE DIARRHEA  . Oxycodone Other (See Comments)    Causes hyperactivity   . Sulfonamide Derivatives Swelling and Rash    Hands & feet swell  . Hydrocodone Other (See Comments)    Causes Hyperactivity     History   Social History  . Marital Status: Widowed    Spouse Name: N/A    Number of Children: N/A  . Years of Education: N/A   Occupational History  . Not on file.   Social History Main Topics  . Smoking status: Current Every Day Smoker -- 0.50 packs/day for 40 years    Types: Cigarettes  . Smokeless tobacco: Never Used     Comment: She smokes 3 cigarettes daily  . Alcohol Use: No  . Drug Use: No  . Sexual Activity: No   Other Topics Concern  . Not on file   Social History Narrative  . No narrative on file     Review of Systems: General: negative for chills, fever, night sweats or weight changes.  Cardiovascular: negative for chest pain, dyspnea on exertion, edema, orthopnea, palpitations, paroxysmal nocturnal dyspnea or shortness of breath Dermatological: negative for rash Respiratory: negative for cough or wheezing Urologic: negative for  hematuria Abdominal: negative for nausea, vomiting, diarrhea, bright red blood per rectum, melena, or hematemesis Neurologic: negative for visual changes, syncope, or dizziness All other systems reviewed and are otherwise negative except as noted above.    Blood pressure 116/66, pulse 59, height 5\' 8"  (1.727 m), weight 197 lb (89.359 kg).  General appearance: alert, cooperative, appears older than stated age and no distress Neck: no carotid bruit and no JVD Lungs: bilateral diffuse expiratory rhonchi Heart: regular rate and rhythm Extremities: no LEE Pulses: 2+ and symmetric Skin: warm and dry Neurologic: Grossly normal  EKG Sinus brady. 59 bpm. No  ischemic changes.   ASSESSMENT AND PLAN:   1. CAD: Extensive coronary history as mentioned above. However she denies any recent anginal symptoms. Her recent midepigastric pain is felt to be atypical and likely noncardiac. Recent nuclear stress testing less than 6 months ago was negative for ischemia. Her EKG is unchanged. Continue medical therapy.  2. Tobacco abuse: Smoking cessation strongly advised  3. AAA: Followed by Dr. Oneida Henson. Her pressures are well controlled. Smoking cessation was encouraged.  4. Hypertension: Pressure is well-controlled at 116/66. Continue current medical regimen.  5. Hyperlipidemia: Followed by her PCP. She remains on statin therapy.   PLAN  patient appears to be stable from a cardiac standpoint. Her AAA is followed by Dr. Oneida Henson. Her heart rate and blood pressure are both well-controlled. Continue current medications. Her PCP will continue to follow her lipid profile and manage her statin therapy. We discussed the importance of smoking cessation and she was encouraged to quit smoking. She has been instructed to followup with her primary cardiologist, Dr. Angelena Henson, in 6 months for repeat evaluation.  SIMMONS, BRITTAINYPA-C 08/19/2014 1:25 PM

## 2014-08-30 ENCOUNTER — Other Ambulatory Visit: Payer: Self-pay

## 2014-10-08 ENCOUNTER — Other Ambulatory Visit (HOSPITAL_COMMUNITY): Payer: Medicare Other

## 2014-10-08 ENCOUNTER — Ambulatory Visit: Payer: Medicare Other | Admitting: Family

## 2014-10-12 ENCOUNTER — Other Ambulatory Visit: Payer: Self-pay | Admitting: Gastroenterology

## 2014-10-12 ENCOUNTER — Other Ambulatory Visit: Payer: Self-pay | Admitting: Cardiovascular Disease

## 2014-11-01 ENCOUNTER — Encounter: Payer: Self-pay | Admitting: Family

## 2014-11-04 ENCOUNTER — Ambulatory Visit: Payer: Medicare Other | Admitting: Family

## 2014-11-04 ENCOUNTER — Other Ambulatory Visit (HOSPITAL_COMMUNITY): Payer: Medicare Other

## 2014-12-26 ENCOUNTER — Encounter: Payer: Self-pay | Admitting: Family

## 2014-12-27 ENCOUNTER — Other Ambulatory Visit (HOSPITAL_COMMUNITY): Payer: Medicare Other

## 2014-12-27 ENCOUNTER — Ambulatory Visit: Payer: Medicare Other | Admitting: Family

## 2015-02-19 ENCOUNTER — Ambulatory Visit (INDEPENDENT_AMBULATORY_CARE_PROVIDER_SITE_OTHER): Payer: Medicare Other | Admitting: Cardiovascular Disease

## 2015-02-19 ENCOUNTER — Encounter: Payer: Self-pay | Admitting: Cardiovascular Disease

## 2015-02-19 VITALS — BP 110/64 | HR 62 | Ht 68.0 in | Wt 193.0 lb

## 2015-02-19 DIAGNOSIS — Z720 Tobacco use: Secondary | ICD-10-CM | POA: Diagnosis not present

## 2015-02-19 DIAGNOSIS — I10 Essential (primary) hypertension: Secondary | ICD-10-CM | POA: Diagnosis not present

## 2015-02-19 DIAGNOSIS — I714 Abdominal aortic aneurysm, without rupture, unspecified: Secondary | ICD-10-CM

## 2015-02-19 DIAGNOSIS — E785 Hyperlipidemia, unspecified: Secondary | ICD-10-CM

## 2015-02-19 DIAGNOSIS — I2510 Atherosclerotic heart disease of native coronary artery without angina pectoris: Secondary | ICD-10-CM

## 2015-02-19 NOTE — Patient Instructions (Signed)
Your physician wants you to follow-up in:  6 months. You will receive a reminder letter in the mail two months in advance. If you don't receive a letter, please call our office to schedule the follow-up appointment.   

## 2015-02-19 NOTE — Progress Notes (Signed)
Chief Complaint  Patient presents with  . Cough    History of Present Illness: 81 yo WF with history of CAD, HTN, Hyperlipidemia, DM, GERD, anxiety/depression here today for cardiac follow up. She has been followed in the past by Dr. Olevia Perches. 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 A new DES was also placed at that time. She previously had a Cypher stent in the circumflex artery as well. Event monitor several years ago with isolated PVCs and APCs. She has chronic shortness of breath related to her COPD. She had an MRI in June 2011 which showed a 3.6 x 3.6 cm infrarenal aortic aneurysm. She has chronic back pain felt to be secondary lumbar disc bulging but not felt to be a surgical candidate. She also has esophageal strictures and had an esophageal dilatation in 2012. She has been on Simvastatin for years. Her lipids are followed in primary care. Last cardiac cath 07/09/11 which showed mild to moderate stable CAD. She had a right lower lobectomy for stage IA non-small cell lung cancer per Dr. Arlyce Dice in October 2012. She has been on Plavix chronically with first generation DES. Her ASA was stopped in 2013. She has been continued on Plavix. Neck mass found in 2015 and has been resected, felt to be benign. Stress myoview May 2015 with no ischemia.   She is here today for follow up. She denies any chest pain or change in her breathing. She has continued to smoke 5-10 cigarettes per day.  She has been found to have a kidney mass and has plans for biopsy per Dr. Clyde Lundborg in [place]. It is not clear exactly what she is having done.   Primary Care Physician: Dr. Nevada Crane   Last Lipid Profile: Per primary care   Past Medical History  Diagnosis Date  . Coronary artery disease     post diaphragmatic wall infarction on 06-2007,due to stent thrombosis   . Chronic obstructive pulmonary disease   . Tobacco abuse   . Hypertension   . Hyperlipidemia    . GERD (gastroesophageal reflux disease)   . Anxiety disorder   . Depression   . Hypothyroidism   . Hx of hysterectomy   . AAA (abdominal aortic aneurysm)   . Pulmonary nodule 04/2011    neg bx, followed by Dr. Arlyce Dice, may still be cancer, being followed with CTs.  . Esophageal dysmotility     Two BPE since 03/2011-->Moderate impairment of esophageal motility.Vallecular residuals. Laryngeal penetration  . Anemia   . Diabetes mellitus     with gastroparesis 70% retention at 2 hours  . Cancer   . Lung cancer   . CHF (congestive heart failure)   . Myocardial infarction   . AAA (abdominal aortic aneurysm) without rupture 09/25/2013    Past Surgical History  Procedure Laterality Date  . Cholecystectomy    . Cataract surg    . Bladder surgery      tack  . Kidney stone surgery    . Coronary stent placement  2004    2  . Coronary stent placement  2008    2  . S/p hysterectomy    . Neck surgery    . Esophagogastroduodenoscopy  5/08    normal esophagus, No H.Pylori  . Egd/bravo  07/2008    On Nexium BID. Day 1 DMSTR Score: 0.7, day 2: DMSTR score: 32, uncontrolled GERD, No H. Pylori  . Hbt  2009  negative  . Colonoscopy  05/2010    tortuous sigm colon, sigm tics, multiple simple adenomas, hemorrhoids, next TCS 10-15 yrs per Dr. Oneida Alar. Previous h/o tubular adenomas in 2008.   Marland Kitchen Esophagogastroduodenoscopy  05/2010    small hh, streaky erythema in body/antrum. Duodenal bx negative.   . Esophagogastroduodenoscopy  06/22/2011    mild gastritis/ring in the distal esophagus, savary dilation. Bx reactive gastropathy, No H.pylori  . Savory dilation  06/22/2011    Procedure: SAVORY DILATION;  Surgeon: Dorothyann Peng, MD;  [facility];  Service: Endoscopy;  Laterality: N/A;  [name] dilation  06/22/2011    Procedure: [name] DILATION;  Surgeon: Dorothyann Peng, MD;  [facility];  Service: Endoscopy;  Laterality: N/A;  . Video brochoscopy with electromagnetic navigaton   05/10/2011    [name]  . Right lower lobe superior segmentectomy  October 2012  . Hysterectomy at age 34    . Cardiac catheterization    . Abdominal hysterectomy    . Esophagogastroduodenoscopy N/A 08/14/2013    Procedure: ESOPHAGOGASTRODUODENOSCOPY (EGD);  Surgeon: Danie Binder, MD;  [facility];  Service: Endoscopy;  Laterality: N/A;  2:45  . Colonoscopy N/A 11/02/2013    Procedure: COLONOSCOPY;  Surgeon: Danie Binder, MD;  [facility];  Service: Endoscopy;  Laterality: N/A;  1:15  . Hemorrhoid banding N/A 11/02/2013    Procedure: HEMORRHOID BANDING;  Surgeon: Danie Binder, MD;  [facility];  Service: Endoscopy;  Laterality: N/A;  . Agile capsule N/A 11/12/2013    Procedure: AGILE CAPSULE;  Surgeon: Danie Binder, MD;  [facility];  Service: Endoscopy;  Laterality: N/A;  8:00  . Bacterial overgrowth test N/A 11/16/2013    Procedure: BACTERIAL OVERGROWTH TEST;  Surgeon: Danie Binder, MD;  [facility];  Service: Endoscopy;  Laterality: N/A;  8:00  . Wide local excision of skin lesion with complex reconstruction Left 04/26/2014    DR TAT  . Mass excision N/A 04/26/2014    Procedure: WIDE LOCAL EXCISION OF NECK SKIN LESION/IMMEDIATE SUBCUTANEOUS TISSUE WITH LOCAL RECONSTRUCTION;  Surgeon: Jerrell Belfast, MD;  Location: [facility] OR;  Service: ENT;  Laterality: N/A;    Current Outpatient Prescriptions  Medication Sig Dispense Refill  . albuterol (PROAIR HFA) 108 (90 BASE) MCG/ACT inhaler Inhale 2 puffs into the lungs every 6 (six) hours as needed for wheezing.     Marland Kitchen albuterol (PROVENTIL) (2.5 MG/3ML) 0.083% nebulizer solution Take 2.5 mg by nebulization every 4 (four) hours as needed for wheezing or shortness of breath.    . ALPRAZolam (XANAX) 1 MG tablet Take 0.5-1 mg by mouth 2 (two) times daily. Take 0.5 mg in am and 1 mg in pm    . amLODipine (NORVASC) 5 MG tablet Take 5 mg by mouth daily.    Marland Kitchen atorvastatin (LIPITOR) 20  MG tablet Take 20 mg by mouth daily.     . budesonide-formoterol (SYMBICORT) 160-4.5 MCG/ACT inhaler Inhale 2 puffs into the lungs 2 (two) times daily. 1 Inhaler 0  . clopidogrel (PLAVIX) 75 MG tablet TAKE 1 TABLET DAILY 30 tablet 5  . cyanocobalamin (,VITAMIN B-12,) 1000 MCG/ML injection Inject 1,000 mcg into the muscle every 30 (thirty) days.    Marland Kitchen docusate sodium (COLACE) 100 MG capsule Take 100 mg by mouth daily as needed for mild constipation.    . gabapentin (NEURONTIN) 100 MG capsule Take 100 mg by mouth. TAKES 100 MG IN THE AM  AND 200 MG AT NIGHT    . JANUVIA 50 MG tablet Take 50 mg by mouth daily.     Marland Kitchen levothyroxine (SYNTHROID, LEVOTHROID) 125 MCG tablet Take 125 mcg by mouth daily before breakfast.    . nitroGLYCERIN (NITROSTAT) 0.4 MG SL tablet Place 0.4 mg under the tongue every 5 (five) minutes as needed for chest pain.    . pantoprazole (PROTONIX) 40 MG tablet Take 40 mg by mouth 2 (two) times daily.     . polyethylene glycol (MIRALAX / GLYCOLAX) packet Take 17 g by mouth daily. 14 each 0   No current facility-administered medications for this visit.    Allergies  Allergen Reactions  . Linzess [Linaclotide] Other (See Comments)    EXPLOSIVE DIARRHEA  . Oxycodone Other (See Comments)    Causes hyperactivity   . Sulfonamide Derivatives Swelling and Rash    Hands & feet swell  . Hydrocodone Other (See Comments)    Causes Hyperactivity     History   Social History  . Marital Status: Widowed    Spouse Name: N/A  . Number of Children: N/A  . Years of Education: N/A   Occupational History  . Not on file.   Social History Main Topics  . Smoking status: Current Every Day Smoker -- 0.50 packs/day for 40 years    Types: Cigarettes  . Smokeless tobacco: Never Used     Comment: She smokes 3 cigarettes daily  . Alcohol Use: No  . Drug Use: No  . Sexual Activity: No   Other Topics Concern  . Not on file   Social History Narrative    Family History  Problem  Relation Age of Onset  . Coronary artery disease      family hx of  . Colon cancer Sister 61  . Colon cancer Daughter   . Prostate cancer      family hx of  . Lung cancer      family hx of  . Ovarian cancer      family hx of  . Arthritis      family hx of  . Other      family hx of chronic respiratory condition  . Heart disease Mother   . Stroke Mother     Review of Systems:  As stated in the HPI and otherwise negative.   BP 110/64 mmHg  Pulse 62  Ht 5\' 8"  (1.727 m)  Wt 193 lb (87.544 kg)  BMI 29.35 kg/m2  Physical Examination: General: Well developed, well nourished, NAD HEENT: OP clear, mucus membranes moist SKIN: warm, dry. No rashes. Neuro: No focal deficits Musculoskeletal: Muscle strength 5/5 all ext Psychiatric: Mood and affect normal Neck: No JVD, no carotid bruits, no thyromegaly, no lymphadenopathy. Lungs:Clear bilaterally, no wheezes, rhonci, crackles Cardiovascular: Regular rate and rhythm. No murmurs, gallops or rubs. Abdomen:Soft. Bowel sounds present. Non-tender.  Extremities: No lower extremity edema. Pulses are 2 + in the bilateral DP/PT.  Lexiscan Myoview May 2015: Stress ECG: No significant change from baseline ECG QPS Raw Data Images: Normal; no motion artifact; normal heart/lung ratio. Stress Images: There is decreased uptake in the anterior wall. Rest Images: There is decreased uptake in the anterior wall. Subtraction (SDS): Mostly fixed anteroseptal and apical defect Impression Exercise Capacity: Lexiscan with no exercise. BP Response: Normal blood pressure response. Clinical Symptoms: No significant symptoms noted. ECG Impression: No significant ST segment change suggestive of ischemia. Comparison with Prior Nuclear Study: No previous nuclear study performed  Overall Impression:  Intermediate risk stress nuclear study with a mostly fixed anteroseptal and apical fixed defect, consistent with possible scar or rate-related LBBB  artifact.  LV Wall Motion: NL LV Function; NL Wall Motion; LVEF 53%.  EKG:  EKG is not ordered today. The ekg ordered today demonstrates  Recent Labs: 05/25/2014: ALT 8 07/28/2014: BUN 11; Creatinine 1.03; Hemoglobin 12.9; Platelets 221; Potassium 3.9; Sodium 142   Lipid Panel followed in primary care    Component Value Date/Time   CHOL  06/30/2007 0305    82        ATP III CLASSIFICATION:  <200     mg/dL   Desirable  200-239  mg/dL   Borderline High  >=240    mg/dL   High   TRIG 93 06/30/2007 0305   HDL 22* 06/30/2007 0305   CHOLHDL 3.7 06/30/2007 0305   VLDL 19 06/30/2007 0305   LDLCALC  06/30/2007 0305    41        Total Cholesterol/HDL:CHD Risk Coronary Heart Disease Risk Table                     Men   Women  1/2 Average Risk   3.4   3.3     Wt Readings from Last 3 Encounters:  02/19/15 193 lb (87.544 kg)  08/19/14 197 lb (89.359 kg)  05/25/14 184 lb (83.462 kg)     Other studies Reviewed: Additional studies/ records that were reviewed today include. Review of the above records demonstrates:    Assessment and Plan:   1. CAD: Stable. No ischemia on stress myoview May 2015. Most recent cath was in August 2012 and her disease was stable. Will continue Plavix. She can hold her plavix for 7 days before surgical procedure.   2. Tobacco abuse: She is smoking 5-10 cigarettes per day and has no desire to scutting back. Smoking cessation encouraged. She wishes to stop.   3. AAA: Stable. Followed by Dr. Donnetta Hutching in [facility]. Needs f/u now.   4. HTN: BP controlled. No changes.   5. HLD: on statin. Lipids followed in primary care   Current medicines are reviewed at length with the patient today.  The patient does not have concerns regarding medicines.  The following changes have been made:  no change  Labs/ tests ordered today include: No orders of the defined types were placed in this encounter.   Disposition:   FU with me in 6 months  Signed, Lauree Chandler,  MD 02/19/2015 9:36 AM    [facility] [address], [place], [place]  [address] Phone: [phone]; Fax: [phone]

## 2015-02-19 NOTE — Progress Notes (Signed)
No chief complaint on file.   History of Present Illness: 81 yo WF with history of CAD, HTN, Hyperlipidemia, DM, GERD, anxiety/depression here today for cardiac follow up. She has been followed in the past by Dr. Olevia Perches. 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 A new DES was also placed at that time. She previously had a Cypher stent in the circumflex artery as well. Event monitor several years ago with isolated PVCs and APCs. She has chronic shortness of breath related to her COPD. She had an MRI in June 2011 which showed a 3.6 x 3.6 cm infrarenal aortic aneurysm. She has chronic back pain felt to be secondary lumbar disc bulging but not felt to be a surgical candidate. She also has esophageal strictures and had an esophageal dilatation in 2012. She has been on Simvastatin for years. Her lipids are followed in primary care. Last cardiac cath 07/09/11 which showed mild to moderate stable CAD. She had a right lower lobectomy for stage IA non-small cell lung cancer per Dr. Arlyce Dice in October 2012. She has been on Plavix chronically with first generation DES. Her ASA was stopped in 2013. She has been continued on Plavix. Neck mass found in 2015. Stress myoview May 2015 with no ischemia.   She is here today for follow up. She denies any chest pain or change in her breathing. She has continued to smoke 3 cigarettes per day.    Primary Care Physician: Dr. Nevada Crane   Last Lipid Profile: Per primary care   Past Medical History  Diagnosis Date  . Coronary artery disease     post diaphragmatic wall infarction on 06-2007,due to stent thrombosis   . Chronic obstructive pulmonary disease   . Tobacco abuse   . Hypertension   . Hyperlipidemia   . GERD (gastroesophageal reflux disease)   . Anxiety disorder   . Depression   . Hypothyroidism   . Hx of hysterectomy   . AAA (abdominal aortic aneurysm)   . Pulmonary nodule 04/2011    neg bx,  followed by Dr. Arlyce Dice, may still be cancer, being followed with CTs.  . Esophageal dysmotility     Two BPE since 03/2011-->Moderate impairment of esophageal motility.Vallecular residuals. Laryngeal penetration  . Anemia   . Diabetes mellitus     with gastroparesis 70% retention at 2 hours  . Cancer   . Lung cancer   . CHF (congestive heart failure)   . Myocardial infarction   . AAA (abdominal aortic aneurysm) without rupture 09/25/2013    Past Surgical History  Procedure Laterality Date  . Cholecystectomy    . Cataract surg    . Bladder surgery      tack  . Kidney stone surgery    . Coronary stent placement  2004    2  . Coronary stent placement  2008    2  . S/p hysterectomy    . Neck surgery    . Esophagogastroduodenoscopy  5/08    normal esophagus, No H.Pylori  . Egd/bravo  07/2008    On Nexium BID. Day 1 DMSTR Score: 0.7, day 2: DMSTR score: 32, uncontrolled GERD, No H. Pylori  . Hbt  2009    negative  . Colonoscopy  05/2010    tortuous sigm colon, sigm tics, multiple simple adenomas, hemorrhoids, next TCS 10-15 yrs per Dr. Oneida Alar. Previous h/o tubular adenomas in 2008.   Marland Kitchen Esophagogastroduodenoscopy  05/2010    small  hh, streaky erythema in body/antrum. Duodenal bx negative.   . Esophagogastroduodenoscopy  06/22/2011    mild gastritis/ring in the distal esophagus, savary dilation. Bx reactive gastropathy, No H.pylori  . Savory dilation  06/22/2011    Procedure: SAVORY DILATION;  Surgeon: Dorothyann Peng, MD;  [facility];  Service: Endoscopy;  Laterality: N/A;  [name] dilation  06/22/2011    Procedure: [name] DILATION;  Surgeon: Dorothyann Peng, MD;  [facility];  Service: Endoscopy;  Laterality: N/A;  . Video brochoscopy with electromagnetic navigaton  05/10/2011    [name]  . Right lower lobe superior segmentectomy  October 2012  . Hysterectomy at age 69    . Cardiac catheterization    . Abdominal hysterectomy    .  Esophagogastroduodenoscopy N/A 08/14/2013    Procedure: ESOPHAGOGASTRODUODENOSCOPY (EGD);  Surgeon: Danie Binder, MD;  [facility];  Service: Endoscopy;  Laterality: N/A;  2:45  . Colonoscopy N/A 11/02/2013    Procedure: COLONOSCOPY;  Surgeon: Danie Binder, MD;  [facility];  Service: Endoscopy;  Laterality: N/A;  1:15  . Hemorrhoid banding N/A 11/02/2013    Procedure: HEMORRHOID BANDING;  Surgeon: Danie Binder, MD;  [facility];  Service: Endoscopy;  Laterality: N/A;  . Agile capsule N/A 11/12/2013    Procedure: AGILE CAPSULE;  Surgeon: Danie Binder, MD;  [facility];  Service: Endoscopy;  Laterality: N/A;  8:00  . Bacterial overgrowth test N/A 11/16/2013    Procedure: BACTERIAL OVERGROWTH TEST;  Surgeon: Danie Binder, MD;  [facility];  Service: Endoscopy;  Laterality: N/A;  8:00  . Wide local excision of skin lesion with complex reconstruction Left 04/26/2014    DR TAT  . Mass excision N/A 04/26/2014    Procedure: WIDE LOCAL EXCISION OF NECK SKIN LESION/IMMEDIATE SUBCUTANEOUS TISSUE WITH LOCAL RECONSTRUCTION;  Surgeon: Jerrell Belfast, MD;  Location: [facility] OR;  Service: ENT;  Laterality: N/A;    Current Outpatient Prescriptions  Medication Sig Dispense Refill  . albuterol (PROAIR HFA) 108 (90 BASE) MCG/ACT inhaler Inhale 2 puffs into the lungs every 6 (six) hours as needed for wheezing.     Marland Kitchen albuterol (PROVENTIL) (2.5 MG/3ML) 0.083% nebulizer solution Take 2.5 mg by nebulization every 4 (four) hours as needed for wheezing or shortness of breath.    . ALPRAZolam (XANAX) 1 MG tablet Take 0.5-1 mg by mouth 2 (two) times daily. Take 0.5 mg in am and 1 mg in pm    . amLODipine (NORVASC) 5 MG tablet Take 5 mg by mouth daily.    Marland Kitchen atorvastatin (LIPITOR) 20 MG tablet Take 20 mg by mouth daily.     . budesonide-formoterol (SYMBICORT) 160-4.5 MCG/ACT inhaler Inhale 2 puffs into the lungs 2 (two) times daily. 1 Inhaler 0  . clopidogrel  (PLAVIX) 75 MG tablet TAKE 1 TABLET DAILY 30 tablet 5  . cyanocobalamin (,VITAMIN B-12,) 1000 MCG/ML injection Inject 1,000 mcg into the muscle every 30 (thirty) days.    Marland Kitchen docusate sodium (COLACE) 100 MG capsule Take 100 mg by mouth daily as needed for mild constipation.    . gabapentin (NEURONTIN) 100 MG capsule Take 100 mg by mouth. TAKES 100 MG IN THE AM AND 200 MG AT NIGHT    . JANUVIA 50 MG tablet Take 50 mg by mouth daily.     Marland Kitchen levothyroxine (SYNTHROID, LEVOTHROID) 125 MCG tablet Take 125 mcg by mouth daily before breakfast.    .  nitroGLYCERIN (NITROSTAT) 0.4 MG SL tablet Place 0.4 mg under the tongue every 5 (five) minutes as needed for chest pain.    . pantoprazole (PROTONIX) 40 MG tablet Take 40 mg by mouth 2 (two) times daily.     . polyethylene glycol (MIRALAX / GLYCOLAX) packet Take 17 g by mouth daily. 14 each 0   No current facility-administered medications for this visit.    Allergies  Allergen Reactions  . Linzess [Linaclotide] Other (See Comments)    EXPLOSIVE DIARRHEA  . Oxycodone Other (See Comments)    Causes hyperactivity   . Sulfonamide Derivatives Swelling and Rash    Hands & feet swell  . Hydrocodone Other (See Comments)    Causes Hyperactivity     History   Social History  . Marital Status: Widowed    Spouse Name: N/A  . Number of Children: N/A  . Years of Education: N/A   Occupational History  . Not on file.   Social History Main Topics  . Smoking status: Current Every Day Smoker -- 0.50 packs/day for 40 years    Types: Cigarettes  . Smokeless tobacco: Never Used     Comment: She smokes 3 cigarettes daily  . Alcohol Use: No  . Drug Use: No  . Sexual Activity: No   Other Topics Concern  . Not on file   Social History Narrative    Family History  Problem Relation Age of Onset  . Coronary artery disease      family hx of  . Colon cancer Sister 27  . Colon cancer Daughter   . Prostate cancer      family hx of  . Lung cancer       family hx of  . Ovarian cancer      family hx of  . Arthritis      family hx of  . Other      family hx of chronic respiratory condition  . Heart disease Mother   . Stroke Mother     Review of Systems:  As stated in the HPI and otherwise negative.   BP 110/64 mmHg  Pulse 62  Ht 5\' 8"  (1.727 m)  Wt 193 lb (87.544 kg)  BMI 29.35 kg/m2  Physical Examination: General: Well developed, well nourished, NAD HEENT: OP clear, mucus membranes moist SKIN: warm, dry. No rashes. Neuro: No focal deficits Musculoskeletal: Muscle strength 5/5 all ext Psychiatric: Mood and affect normal Neck: No JVD, no carotid bruits, no thyromegaly, no lymphadenopathy. Lungs:Clear bilaterally, no wheezes, rhonci, crackles Cardiovascular: Regular rate and rhythm. No murmurs, gallops or rubs. Abdomen:Soft. Bowel sounds present. Non-tender.  Extremities: No lower extremity edema. Pulses are 2 + in the bilateral DP/PT.  Lexiscan Myoview May 2015: Stress ECG: No significant change from baseline ECG  QPS Raw Data Images: Normal; no motion artifact; normal heart/lung ratio. Stress Images: There is decreased uptake in the anterior wall. Rest Images: There is decreased uptake in the anterior wall. Subtraction (SDS): Mostly fixed anteroseptal and apical defect  Impression Exercise Capacity: Lexiscan with no exercise. BP Response: Normal blood pressure response. Clinical Symptoms: No significant symptoms noted. ECG Impression: No significant ST segment change suggestive of ischemia. Comparison with Prior Nuclear Study: No previous nuclear study performed  Overall Impression: Intermediate risk stress nuclear study with a mostly fixed anteroseptal and apical fixed defect, consistent with possible scar or rate-related LBBB artifact.  LV Wall Motion: NL LV Function; NL Wall Motion; LVEF 53%.  EKG:  EKG is not  ordered today. The ekg ordered today demonstrates  Recent Labs: 05/25/2014: ALT  8 07/28/2014: BUN 11; Creatinine 1.03; Hemoglobin 12.9; Platelets 221; Potassium 3.9; Sodium 142   Lipid Panel    Component Value Date/Time   CHOL  06/30/2007 0305    82        ATP III CLASSIFICATION:  <200     mg/dL   Desirable  200-239  mg/dL   Borderline High  >=240    mg/dL   High   TRIG 93 06/30/2007 0305   HDL 22* 06/30/2007 0305   CHOLHDL 3.7 06/30/2007 0305   VLDL 19 06/30/2007 0305   LDLCALC  06/30/2007 0305    41        Total Cholesterol/HDL:CHD Risk Coronary Heart Disease Risk Table                     Men   Women  1/2 Average Risk   3.4   3.3     Wt Readings from Last 3 Encounters:  02/19/15 193 lb (87.544 kg)  08/19/14 197 lb (89.359 kg)  05/25/14 184 lb (83.462 kg)     Other studies Reviewed: Additional studies/ records that were reviewed today include:  Review of the above records demonstrates:    Assessment and Plan:   1. CAD: Stable. No ischemia on stress myoview May 2015. Most recent cath was in August 2012 and her disease was stable. Will continue Plavix.   2. Tobacco abuse: She is cutting back. Smoking cessation encouraged. She wishes to stop.   3. AAA: Stable. Recent visit with Dr. Donnetta Hutching in [facility] November 2014.   4. Carotid artery disease: Stable by dopplers May 2014.   5. HTN: BP controlled. No changes.   6. HLD: on statin.   Current medicines are reviewed at length with the patient today.  The patient does not have concerns regarding medicines.  The following changes have been made:  no change  Labs/ tests ordered today include:  No orders of the defined types were placed in this encounter.     Disposition:   FU with me  in 6 months   Signed, Lauree Chandler, MD 02/19/2015 9:36 AM    [facility] [address], [place], [place]  [address] Phone: [phone]; Fax: [phone]

## 2015-03-17 ENCOUNTER — Other Ambulatory Visit: Payer: Self-pay

## 2015-03-18 MED ORDER — PANTOPRAZOLE SODIUM 40 MG PO TBEC: 40.0000 mg | DELAYED_RELEASE_TABLET | Freq: Two times a day (BID) | ORAL | Status: AC

## 2015-04-13 ENCOUNTER — Inpatient Hospital Stay (HOSPITAL_COMMUNITY)
Admission: EM | Admit: 2015-04-13 | Discharge: 2015-04-15 | DRG: 192 | Disposition: A | Discharge status: Home / Self Care | Payer: Medicare Other | Attending: Internal Medicine | Admitting: Internal Medicine

## 2015-04-13 ENCOUNTER — Encounter (HOSPITAL_COMMUNITY): Payer: Self-pay

## 2015-04-13 DIAGNOSIS — J9610 Chronic respiratory failure, unspecified whether with hypoxia or hypercapnia: Secondary | ICD-10-CM

## 2015-04-13 DIAGNOSIS — F17210 Nicotine dependence, cigarettes, uncomplicated: Secondary | ICD-10-CM | POA: Diagnosis present

## 2015-04-13 DIAGNOSIS — R059 Cough, unspecified: Secondary | ICD-10-CM

## 2015-04-13 DIAGNOSIS — Z8249 Family history of ischemic heart disease and other diseases of the circulatory system: Secondary | ICD-10-CM

## 2015-04-13 DIAGNOSIS — E1143 Type 2 diabetes mellitus with diabetic autonomic (poly)neuropathy: Secondary | ICD-10-CM | POA: Diagnosis present

## 2015-04-13 DIAGNOSIS — F419 Anxiety disorder, unspecified: Secondary | ICD-10-CM | POA: Diagnosis present

## 2015-04-13 DIAGNOSIS — I252 Old myocardial infarction: Secondary | ICD-10-CM

## 2015-04-13 DIAGNOSIS — Z888 Allergy status to other drugs, medicaments and biological substances status: Secondary | ICD-10-CM

## 2015-04-13 DIAGNOSIS — E119 Type 2 diabetes mellitus without complications: Secondary | ICD-10-CM

## 2015-04-13 DIAGNOSIS — Z7951 Long term (current) use of inhaled steroids: Secondary | ICD-10-CM

## 2015-04-13 DIAGNOSIS — J441 Chronic obstructive pulmonary disease with (acute) exacerbation: Secondary | ICD-10-CM | POA: Diagnosis not present

## 2015-04-13 DIAGNOSIS — R079 Chest pain, unspecified: Secondary | ICD-10-CM | POA: Diagnosis not present

## 2015-04-13 DIAGNOSIS — Z885 Allergy status to narcotic agent status: Secondary | ICD-10-CM

## 2015-04-13 DIAGNOSIS — I714 Abdominal aortic aneurysm, without rupture, unspecified: Secondary | ICD-10-CM

## 2015-04-13 DIAGNOSIS — K3184 Gastroparesis: Secondary | ICD-10-CM | POA: Diagnosis present

## 2015-04-13 DIAGNOSIS — K219 Gastro-esophageal reflux disease without esophagitis: Secondary | ICD-10-CM | POA: Diagnosis present

## 2015-04-13 DIAGNOSIS — Z882 Allergy status to sulfonamides status: Secondary | ICD-10-CM

## 2015-04-13 DIAGNOSIS — Z7952 Long term (current) use of systemic steroids: Secondary | ICD-10-CM

## 2015-04-13 DIAGNOSIS — Z955 Presence of coronary angioplasty implant and graft: Secondary | ICD-10-CM

## 2015-04-13 DIAGNOSIS — F329 Major depressive disorder, single episode, unspecified: Secondary | ICD-10-CM | POA: Diagnosis present

## 2015-04-13 DIAGNOSIS — Z823 Family history of stroke: Secondary | ICD-10-CM

## 2015-04-13 DIAGNOSIS — I10 Essential (primary) hypertension: Secondary | ICD-10-CM | POA: Diagnosis present

## 2015-04-13 DIAGNOSIS — E039 Hypothyroidism, unspecified: Secondary | ICD-10-CM | POA: Diagnosis present

## 2015-04-13 DIAGNOSIS — Z85118 Personal history of other malignant neoplasm of bronchus and lung: Secondary | ICD-10-CM

## 2015-04-13 DIAGNOSIS — R0781 Pleurodynia: Secondary | ICD-10-CM | POA: Diagnosis present

## 2015-04-13 DIAGNOSIS — I2510 Atherosclerotic heart disease of native coronary artery without angina pectoris: Secondary | ICD-10-CM | POA: Diagnosis present

## 2015-04-13 DIAGNOSIS — R05 Cough: Secondary | ICD-10-CM

## 2015-04-13 DIAGNOSIS — E785 Hyperlipidemia, unspecified: Secondary | ICD-10-CM | POA: Diagnosis present

## 2015-04-13 DIAGNOSIS — Z8261 Family history of arthritis: Secondary | ICD-10-CM

## 2015-04-13 DIAGNOSIS — Z7902 Long term (current) use of antithrombotics/antiplatelets: Secondary | ICD-10-CM

## 2015-04-13 DIAGNOSIS — Z79899 Other long term (current) drug therapy: Secondary | ICD-10-CM

## 2015-04-13 MED ORDER — METHYLPREDNISOLONE SODIUM SUCC 125 MG IJ SOLR
125.0000 mg | Freq: Once | INTRAMUSCULAR | Status: AC
  Administered 2015-04-14: 125 mg via INTRAVENOUS
  Filled 2015-04-13: qty 2

## 2015-04-13 MED ORDER — ALBUTEROL SULFATE (2.5 MG/3ML) 0.083% IN NEBU
5.0000 mg | INHALATION_SOLUTION | Freq: Once | RESPIRATORY_TRACT | Status: AC
Start: 2015-04-14 — End: 2015-04-14
  Administered 2015-04-14: 5 mg via RESPIRATORY_TRACT
  Filled 2015-04-13: qty 6

## 2015-04-13 NOTE — ED Provider Notes (Signed)
CSN: [id number]     Arrival date & time 04/13/15  2321 History   First MD Initiated Contact with Patient 04/13/15 2327     This chart was scribed for Madison Rice, MD by Forrestine Him, ED Scribe. This patient was seen in room D32C/D32C and the patient's care was started 11:44 PM.   Chief Complaint  Patient presents with  . Chest Pain   The history is provided by the patient. No language interpreter was used.    HPI Comments: Madison ENGLANDER brought in by EMS is a 81 y.o. female with a PMHx of CAD, HTN, MI, CHF,  hyperlipidemia, AAA,  COPD, who presents to the Emergency Department complaining of constant, ongoing, progressively worsening abdominal pain that radiates into her epigastrium x 1 day. She also reports nausea along with a productive cough consisting of white/green mucous. Pain is exacerbated with deep breathing. No alleviating factors at this time. [name] took 1 dose of Nitro prior to EMS arrival and was then administered 3 additional Nitro en route without any improvement. Pt also given 2.5 of albuterol, 324 ASA, 400 ml of normal saline. No recent fever. She is on 5 liters of Oxygen at nighttime only. PSHx includes coronary stent placement in 2004 and in 2008. She is currently being treated for a upper respiratory infection.  Past Medical History  Diagnosis Date  . Coronary artery disease     post diaphragmatic wall infarction on 06-2007,due to stent thrombosis   . Chronic obstructive pulmonary disease   . Tobacco abuse   . Hypertension   . Hyperlipidemia   . GERD (gastroesophageal reflux disease)   . Anxiety disorder   . Depression   . Hypothyroidism   . Hx of hysterectomy   . AAA (abdominal aortic aneurysm)   . Pulmonary nodule 04/2011    neg bx, followed by Dr. Arlyce Dice, may still be cancer, being followed with CTs.  . Esophageal dysmotility     Two BPE since 03/2011-->Moderate impairment of esophageal motility.Vallecular residuals. Laryngeal penetration  . Anemia   .  Diabetes mellitus     with gastroparesis 70% retention at 2 hours  . Cancer   . Lung cancer   . CHF (congestive heart failure)   . Myocardial infarction   . AAA (abdominal aortic aneurysm) without rupture 09/25/2013   Past Surgical History  Procedure Laterality Date  . Cholecystectomy    . Cataract surg    . Bladder surgery      tack  . Kidney stone surgery    . Coronary stent placement  2004    2  . Coronary stent placement  2008    2  . S/p hysterectomy    . Neck surgery    . Esophagogastroduodenoscopy  5/08    normal esophagus, No H.Pylori  . Egd/bravo  07/2008    On Nexium BID. Day 1 DMSTR Score: 0.7, day 2: DMSTR score: 32, uncontrolled GERD, No H. Pylori  . Hbt  2009    negative  . Colonoscopy  05/2010    tortuous sigm colon, sigm tics, multiple simple adenomas, hemorrhoids, next TCS 10-15 yrs per Dr. Oneida Alar. Previous h/o tubular adenomas in 2008.   Marland Kitchen Esophagogastroduodenoscopy  05/2010    small hh, streaky erythema in body/antrum. Duodenal bx negative.   . Esophagogastroduodenoscopy  06/22/2011    mild gastritis/ring in the distal esophagus, savary dilation. Bx reactive gastropathy, No H.pylori  . Savory dilation  06/22/2011    Procedure: SAVORY DILATION;  Surgeon:  Dorothyann Peng, MD;  Location: [facility];  Service: Endoscopy;  Laterality: N/A;  [name] dilation  06/22/2011    Procedure: [name] DILATION;  Surgeon: Dorothyann Peng, MD;  Location: [facility];  Service: Endoscopy;  Laterality: N/A;  . Video brochoscopy with electromagnetic navigaton  05/10/2011    [name]  . Right lower lobe superior segmentectomy  October 2012  . Hysterectomy at age 82    . Cardiac catheterization    . Abdominal hysterectomy    . Esophagogastroduodenoscopy N/A 08/14/2013    Procedure: ESOPHAGOGASTRODUODENOSCOPY (EGD);  Surgeon: Danie Binder, MD;  Location: [facility];  Service: Endoscopy;  Laterality: N/A;  2:45  . Colonoscopy N/A 11/02/2013    Procedure: COLONOSCOPY;   Surgeon: Danie Binder, MD;  Location: [facility];  Service: Endoscopy;  Laterality: N/A;  1:15  . Hemorrhoid banding N/A 11/02/2013    Procedure: HEMORRHOID BANDING;  Surgeon: Danie Binder, MD;  Location: [facility];  Service: Endoscopy;  Laterality: N/A;  . Agile capsule N/A 11/12/2013    Procedure: AGILE CAPSULE;  Surgeon: Danie Binder, MD;  Location: [facility];  Service: Endoscopy;  Laterality: N/A;  8:00  . Bacterial overgrowth test N/A 11/16/2013    Procedure: BACTERIAL OVERGROWTH TEST;  Surgeon: Danie Binder, MD;  Location: [facility];  Service: Endoscopy;  Laterality: N/A;  8:00  . Wide local excision of skin lesion with complex reconstruction Left 04/26/2014    DR TAT  . Mass excision N/A 04/26/2014    Procedure: WIDE LOCAL EXCISION OF NECK SKIN LESION/IMMEDIATE SUBCUTANEOUS TISSUE WITH LOCAL RECONSTRUCTION;  Surgeon: Jerrell Belfast, MD;  Location: [facility] OR;  Service: ENT;  Laterality: N/A;   Family History  Problem Relation Age of Onset  . Coronary artery disease      family hx of  . Colon cancer Sister 15  . Colon cancer Daughter   . Prostate cancer      family hx of  . Lung cancer      family hx of  . Ovarian cancer      family hx of  . Arthritis      family hx of  . Other      family hx of chronic respiratory condition  . Heart disease Mother   . Stroke Mother    History  Substance Use Topics  . Smoking status: Current Every Day Smoker -- 0.50 packs/day for 40 years    Types: Cigarettes  . Smokeless tobacco: Never Used     Comment: She smokes 3 cigarettes daily  . Alcohol Use: No   OB History    No data available     Review of Systems  Constitutional: Negative for fever and chills.  Respiratory: Positive for cough, shortness of breath and wheezing.   Cardiovascular: Positive for chest pain. Negative for palpitations and leg swelling.  Gastrointestinal: Positive for nausea and abdominal pain. Negative for vomiting, diarrhea and constipation.   Musculoskeletal: Negative for myalgias, back pain, neck pain and neck stiffness.  Skin: Negative for rash.  Neurological: Negative for dizziness, weakness, light-headedness, numbness and headaches.  Psychiatric/Behavioral: Negative for confusion.  All other systems reviewed and are negative.     Allergies  Linzess; Oxycodone; Sulfonamide derivatives; and Hydrocodone  Home Medications   Prior to Admission medications   Medication Sig Start Date End Date Taking? Authorizing Provider  albuterol (PROAIR HFA) 108 (90 BASE) MCG/ACT inhaler Inhale 2 puffs into the lungs every  6 (six) hours as needed for wheezing.    Yes Historical Provider, MD  albuterol (PROVENTIL) (2.5 MG/3ML) 0.083% nebulizer solution Take 2.5 mg by nebulization every 4 (four) hours as needed for wheezing or shortness of breath.   Yes Historical Provider, MD  ALPRAZolam Duanne Moron) 1 MG tablet Take 0.5-1 mg by mouth 2 (two) times daily. Take 0.5 mg in am and 1 mg in pm   Yes Historical Provider, MD  amLODipine (NORVASC) 5 MG tablet Take 5 mg by mouth daily.   Yes Historical Provider, MD  atorvastatin (LIPITOR) 20 MG tablet Take 20 mg by mouth daily.    Yes Historical Provider, MD  budesonide-formoterol (SYMBICORT) 160-4.5 MCG/ACT inhaler Inhale 2 puffs into the lungs 2 (two) times daily. 05/02/14  Yes Debbe Odea, MD  clopidogrel (PLAVIX) 75 MG tablet TAKE 1 TABLET DAILY 10/15/14  Yes Burnell Blanks, MD  gabapentin (NEURONTIN) 100 MG capsule Take 100 mg by mouth. TAKES 100 MG IN THE AM AND 200 MG AT NIGHT   Yes Historical Provider, MD  JANUVIA 50 MG tablet Take 50 mg by mouth daily.  07/04/12  Yes Historical Provider, MD  levothyroxine (SYNTHROID, LEVOTHROID) 125 MCG tablet Take 125 mcg by mouth daily before breakfast.   Yes Historical Provider, MD  nitrofurantoin (MACRODANTIN) 50 MG capsule Take 50 mg by mouth 2 (two) times daily.   Yes Historical Provider, MD  nitroGLYCERIN (NITROSTAT) 0.4 MG SL tablet Place 0.4 mg under  the tongue every 5 (five) minutes as needed for chest pain.   Yes Historical Provider, MD  pantoprazole (PROTONIX) 40 MG tablet Take 1 tablet (40 mg total) by mouth 2 (two) times daily. 03/18/15  Yes Carlis Stable, NP  polyethylene glycol (MIRALAX / GLYCOLAX) packet Take 17 g by mouth daily. 12/26/13  Yes Davonna Belling, MD   Triage Vitals: BP 117/46 mmHg  Pulse 75  Temp(Src) 97.9 F (36.6 C) (Oral)  Resp 12  Wt 189 lb 14.4 oz (86.138 kg)  SpO2 92%   Physical Exam  Constitutional: She is oriented to person, place, and time. She appears well-developed and well-nourished. No distress.  HENT:  Head: Normocephalic and atraumatic.  Mouth/Throat: Oropharynx is clear and moist. No oropharyngeal exudate.  Eyes: EOM are normal. Pupils are equal, round, and reactive to light.  Neck: Normal range of motion. Neck supple.  Cardiovascular: Normal rate and regular rhythm.   Pulmonary/Chest: Effort normal. No respiratory distress. She has wheezes (expiratory wheezing throughout). She has no rales. She exhibits no tenderness.  Abdominal: Soft. Bowel sounds are normal. She exhibits no distension and no mass. There is tenderness (epigastric tenderness to palpation. No rebound or guarding.). There is no rebound and no guarding.  Musculoskeletal: Normal range of motion. She exhibits no edema or tenderness.  No CVA tenderness bilaterally. No lower extremity swelling or pain.  Neurological: She is alert and oriented to person, place, and time.  Moves all extremities without deficit. Sensation is grossly intact.  Skin: Skin is warm and dry. No rash noted. No erythema.  Psychiatric: She has a normal mood and affect. Her behavior is normal.  Nursing note and vitals reviewed.   ED Course  Procedures (including critical care time)  DIAGNOSTIC STUDIES: Oxygen Saturation is 91% on RA, low by my interpretation.    COORDINATION OF CARE: 11:44 PM- Will give Solu-medrol and Proventil. Will order CXR, troponin I,  urinalysis, CBC, CMP, and lipase. Discussed treatment plan with pt at bedside and pt agreed to plan.  Labs Review Labs Reviewed  LIPASE, BLOOD - Abnormal; Notable for the following:    Lipase 18 (*)    All other components within normal limits  URINALYSIS, ROUTINE W REFLEX MICROSCOPIC (NOT AT [facility]) - Abnormal; Notable for the following:    Color, Urine AMBER (*)    Leukocytes, UA SMALL (*)    All other components within normal limits  COMPREHENSIVE METABOLIC PANEL - Abnormal; Notable for the following:    Glucose, Bld 102 (*)    Creatinine, Ser 1.17 (*)    Calcium 8.4 (*)    Total Protein 6.0 (*)    Albumin 3.3 (*)    AST 12 (*)    ALT 9 (*)    GFR calc non Af Amer 42 (*)    GFR calc Af Amer 48 (*)    All other components within normal limits  URINE MICROSCOPIC-ADD ON - Abnormal; Notable for the following:    Squamous Epithelial / LPF MANY (*)    All other components within normal limits  TROPONIN I  CBC WITH DIFFERENTIAL/PLATELET    Imaging Review Dg Chest 2 View  04/14/2015   CLINICAL DATA:  Cough and lower chest pain for 1 day.  EXAM: CHEST  2 VIEW  COMPARISON:  07/30/2014  FINDINGS: Postsurgical change in the right hemithorax with clips at the hila and volume loss. There is no consolidation, pleural effusion or pneumothorax. The heart size is normal, tortuosity of the thoracic aorta. No acute osseous abnormalities are seen.  IMPRESSION: Postsurgical change in the right hemithorax. No acute pulmonary process.   Electronically Signed   By: Jeb Levering M.D.   On: 04/14/2015 01:04     EKG Interpretation None      MDM   Final diagnoses:  Cough  COPD exacerbation    I personally performed the services described in this documentation, which was scribed in my presence. The recorded information has been reviewed and is accurate.  Patient with improved breathing though still has expiratory wheezing. Sats in the low 90s. Discussed with Dr. Fabio Neighbors who will see the  patient and admit for COPD exacerbation. Patient's chest pain is very pleuritic and atypical for coronary artery disease. Troponin and EKG without any acute findings.  Madison Rice, MD 04/14/15 [phone]

## 2015-04-13 NOTE — ED Notes (Addendum)
Pt reports her pain starts just above her umbilicus and moves towards her central chest.

## 2015-04-13 NOTE — ED Notes (Signed)
Please call daughter Sunday Spillers at mobile number listed in patients demographics if pt is d/c or admitted.

## 2015-04-13 NOTE — ED Notes (Signed)
PER EMS: pt from home, she is a patient at [facility]. Pt is being treated for a URI and she reports chest pain onset 72 hours ago. Per ems, inspiratory and expiratory wheezing with producitve cough with green phlegm. Pt took 1 nitro prior to ems and then ems administered 3 additional nitro with no relief. Pt also given 2.5 of albuterol and 324 aspirin, 400 ml of normal saline. EMS also states she had a few runs of a-fib with no hx of a-fib. BP-106/62, HR-72, O2-98% RA.

## 2015-04-14 ENCOUNTER — Inpatient Hospital Stay (HOSPITAL_COMMUNITY): Payer: Medicare Other

## 2015-04-14 ENCOUNTER — Emergency Department (HOSPITAL_COMMUNITY): Payer: Medicare Other

## 2015-04-14 DIAGNOSIS — Z882 Allergy status to sulfonamides status: Secondary | ICD-10-CM | POA: Diagnosis not present

## 2015-04-14 DIAGNOSIS — I714 Abdominal aortic aneurysm, without rupture: Secondary | ICD-10-CM

## 2015-04-14 DIAGNOSIS — Z85118 Personal history of other malignant neoplasm of bronchus and lung: Secondary | ICD-10-CM | POA: Diagnosis not present

## 2015-04-14 DIAGNOSIS — R079 Chest pain, unspecified: Secondary | ICD-10-CM | POA: Diagnosis present

## 2015-04-14 DIAGNOSIS — Z8249 Family history of ischemic heart disease and other diseases of the circulatory system: Secondary | ICD-10-CM | POA: Diagnosis not present

## 2015-04-14 DIAGNOSIS — I252 Old myocardial infarction: Secondary | ICD-10-CM | POA: Diagnosis not present

## 2015-04-14 DIAGNOSIS — J441 Chronic obstructive pulmonary disease with (acute) exacerbation: Secondary | ICD-10-CM | POA: Diagnosis present

## 2015-04-14 DIAGNOSIS — Z823 Family history of stroke: Secondary | ICD-10-CM | POA: Diagnosis not present

## 2015-04-14 DIAGNOSIS — I10 Essential (primary) hypertension: Secondary | ICD-10-CM | POA: Diagnosis present

## 2015-04-14 DIAGNOSIS — Z7951 Long term (current) use of inhaled steroids: Secondary | ICD-10-CM | POA: Diagnosis not present

## 2015-04-14 DIAGNOSIS — E1159 Type 2 diabetes mellitus with other circulatory complications: Secondary | ICD-10-CM

## 2015-04-14 DIAGNOSIS — J9610 Chronic respiratory failure, unspecified whether with hypoxia or hypercapnia: Secondary | ICD-10-CM

## 2015-04-14 DIAGNOSIS — J9611 Chronic respiratory failure with hypoxia: Secondary | ICD-10-CM

## 2015-04-14 DIAGNOSIS — Z7952 Long term (current) use of systemic steroids: Secondary | ICD-10-CM | POA: Diagnosis not present

## 2015-04-14 DIAGNOSIS — F419 Anxiety disorder, unspecified: Secondary | ICD-10-CM | POA: Diagnosis present

## 2015-04-14 DIAGNOSIS — Z7902 Long term (current) use of antithrombotics/antiplatelets: Secondary | ICD-10-CM | POA: Diagnosis not present

## 2015-04-14 DIAGNOSIS — K3184 Gastroparesis: Secondary | ICD-10-CM | POA: Diagnosis present

## 2015-04-14 DIAGNOSIS — Z955 Presence of coronary angioplasty implant and graft: Secondary | ICD-10-CM | POA: Diagnosis not present

## 2015-04-14 DIAGNOSIS — Z885 Allergy status to narcotic agent status: Secondary | ICD-10-CM | POA: Diagnosis not present

## 2015-04-14 DIAGNOSIS — E785 Hyperlipidemia, unspecified: Secondary | ICD-10-CM | POA: Diagnosis present

## 2015-04-14 DIAGNOSIS — E1165 Type 2 diabetes mellitus with hyperglycemia: Secondary | ICD-10-CM | POA: Diagnosis not present

## 2015-04-14 DIAGNOSIS — F17210 Nicotine dependence, cigarettes, uncomplicated: Secondary | ICD-10-CM | POA: Diagnosis present

## 2015-04-14 DIAGNOSIS — F329 Major depressive disorder, single episode, unspecified: Secondary | ICD-10-CM | POA: Diagnosis present

## 2015-04-14 DIAGNOSIS — K219 Gastro-esophageal reflux disease without esophagitis: Secondary | ICD-10-CM | POA: Diagnosis present

## 2015-04-14 DIAGNOSIS — Z79899 Other long term (current) drug therapy: Secondary | ICD-10-CM | POA: Diagnosis not present

## 2015-04-14 DIAGNOSIS — I2510 Atherosclerotic heart disease of native coronary artery without angina pectoris: Secondary | ICD-10-CM | POA: Diagnosis present

## 2015-04-14 DIAGNOSIS — R0781 Pleurodynia: Secondary | ICD-10-CM | POA: Diagnosis present

## 2015-04-14 DIAGNOSIS — Z8261 Family history of arthritis: Secondary | ICD-10-CM | POA: Diagnosis not present

## 2015-04-14 DIAGNOSIS — Z888 Allergy status to other drugs, medicaments and biological substances status: Secondary | ICD-10-CM | POA: Diagnosis not present

## 2015-04-14 DIAGNOSIS — E1143 Type 2 diabetes mellitus with diabetic autonomic (poly)neuropathy: Secondary | ICD-10-CM | POA: Diagnosis present

## 2015-04-14 DIAGNOSIS — E039 Hypothyroidism, unspecified: Secondary | ICD-10-CM | POA: Diagnosis present

## 2015-04-14 LAB — URINALYSIS, ROUTINE W REFLEX MICROSCOPIC
Bilirubin Urine: NEGATIVE
GLUCOSE, UA: NEGATIVE mg/dL
Hgb urine dipstick: NEGATIVE
Ketones, ur: NEGATIVE mg/dL
Nitrite: NEGATIVE
PROTEIN: NEGATIVE mg/dL
Specific Gravity, Urine: 1.017 (ref 1.005–1.030)
Urobilinogen, UA: 1 mg/dL (ref 0.0–1.0)
pH: 6.5 (ref 5.0–8.0)

## 2015-04-14 LAB — COMPREHENSIVE METABOLIC PANEL
ALK PHOS: 80 U/L (ref 38–126)
ALT: 9 U/L — ABNORMAL LOW (ref 14–54)
ANION GAP: 7 (ref 5–15)
AST: 12 U/L — ABNORMAL LOW (ref 15–41)
Albumin: 3.3 g/dL — ABNORMAL LOW (ref 3.5–5.0)
BILIRUBIN TOTAL: 0.6 mg/dL (ref 0.3–1.2)
BUN: 10 mg/dL (ref 6–20)
CALCIUM: 8.4 mg/dL — AB (ref 8.9–10.3)
CHLORIDE: 109 mmol/L (ref 101–111)
CO2: 26 mmol/L (ref 22–32)
CREATININE: 1.17 mg/dL — AB (ref 0.44–1.00)
GFR calc Af Amer: 48 mL/min — ABNORMAL LOW (ref >60)
GFR calc non Af Amer: 42 mL/min — ABNORMAL LOW (ref >60)
GLUCOSE: 102 mg/dL — AB (ref 65–99)
POTASSIUM: 3.6 mmol/L (ref 3.5–5.1)
Sodium: 142 mmol/L (ref 135–145)
TOTAL PROTEIN: 6 g/dL — AB (ref 6.5–8.1)

## 2015-04-14 LAB — GLUCOSE, CAPILLARY
GLUCOSE-CAPILLARY: 142 mg/dL — AB (ref 65–99)
GLUCOSE-CAPILLARY: 155 mg/dL — AB (ref 65–99)
Glucose-Capillary: 165 mg/dL — ABNORMAL HIGH (ref 65–99)
Glucose-Capillary: 181 mg/dL — ABNORMAL HIGH (ref 65–99)

## 2015-04-14 LAB — TROPONIN I
Troponin I: <0.03 ng/mL (ref <0.031)
Troponin I: <0.03 ng/mL (ref <0.031)

## 2015-04-14 LAB — CBC WITH DIFFERENTIAL/PLATELET
BASOS PCT: 0 % (ref 0–1)
Basophils Absolute: 0 10*3/uL (ref 0.0–0.1)
EOS PCT: 2 % (ref 0–5)
Eosinophils Absolute: 0.1 10*3/uL (ref 0.0–0.7)
HEMATOCRIT: 36.9 % (ref 36.0–46.0)
Hemoglobin: 12 g/dL (ref 12.0–15.0)
Lymphocytes Relative: 34 % (ref 12–46)
Lymphs Abs: 2.4 10*3/uL (ref 0.7–4.0)
MCH: 29.8 pg (ref 26.0–34.0)
MCHC: 32.5 g/dL (ref 30.0–36.0)
MCV: 91.6 fL (ref 78.0–100.0)
MONO ABS: 0.7 10*3/uL (ref 0.1–1.0)
Monocytes Relative: 9 % (ref 3–12)
Neutro Abs: 3.9 10*3/uL (ref 1.7–7.7)
Neutrophils Relative %: 55 % (ref 43–77)
PLATELETS: 206 10*3/uL (ref 150–400)
RBC: 4.03 MIL/uL (ref 3.87–5.11)
RDW: 12.8 % (ref 11.5–15.5)
WBC: 7.1 10*3/uL (ref 4.0–10.5)

## 2015-04-14 LAB — URINE MICROSCOPIC-ADD ON

## 2015-04-14 LAB — D-DIMER, QUANTITATIVE: D-Dimer, Quant: 1.55 ug/mL-FEU — ABNORMAL HIGH (ref 0.00–0.48)

## 2015-04-14 LAB — LIPASE, BLOOD: LIPASE: 18 U/L — AB (ref 22–51)

## 2015-04-14 MED ORDER — POLYETHYLENE GLYCOL 3350 17 G PO PACK
17.0000 g | PACK | Freq: Every day | ORAL | Status: DC
  Administered 2015-04-14 – 2015-04-15 (×2): 17 g via ORAL
  Filled 2015-04-14 (×2): qty 1

## 2015-04-14 MED ORDER — LEVOTHYROXINE SODIUM 125 MCG PO TABS
125.0000 ug | ORAL_TABLET | Freq: Every day | ORAL | Status: DC
  Administered 2015-04-14 – 2015-04-15 (×2): 125 ug via ORAL
  Filled 2015-04-14 (×3): qty 1

## 2015-04-14 MED ORDER — IPRATROPIUM-ALBUTEROL 0.5-2.5 (3) MG/3ML IN SOLN
3.0000 mL | Freq: Four times a day (QID) | RESPIRATORY_TRACT | Status: DC
  Administered 2015-04-14 – 2015-04-15 (×6): 3 mL via RESPIRATORY_TRACT
  Filled 2015-04-14 (×7): qty 3

## 2015-04-14 MED ORDER — ALPRAZOLAM 0.5 MG PO TABS
1.0000 mg | ORAL_TABLET | Freq: Every day | ORAL | Status: DC
  Administered 2015-04-14: 1 mg via ORAL
  Filled 2015-04-14: qty 2

## 2015-04-14 MED ORDER — NITROFURANTOIN MACROCRYSTAL 50 MG PO CAPS
50.0000 mg | ORAL_CAPSULE | Freq: Two times a day (BID) | ORAL | Status: DC
  Administered 2015-04-14: 50 mg via ORAL
  Filled 2015-04-14 (×2): qty 1

## 2015-04-14 MED ORDER — AZITHROMYCIN 500 MG PO TABS
500.0000 mg | ORAL_TABLET | Freq: Every day | ORAL | Status: DC
  Administered 2015-04-14 – 2015-04-15 (×2): 500 mg via ORAL
  Filled 2015-04-14 (×2): qty 1

## 2015-04-14 MED ORDER — METHYLPREDNISOLONE SODIUM SUCC 125 MG IJ SOLR
60.0000 mg | Freq: Three times a day (TID) | INTRAMUSCULAR | Status: DC
  Administered 2015-04-14 – 2015-04-15 (×3): 60 mg via INTRAVENOUS
  Filled 2015-04-14 (×2): qty 0.96
  Filled 2015-04-14 (×3): qty 2
  Filled 2015-04-14: qty 0.96

## 2015-04-14 MED ORDER — BUDESONIDE 0.25 MG/2ML IN SUSP
0.2500 mg | Freq: Two times a day (BID) | RESPIRATORY_TRACT | Status: DC
  Administered 2015-04-14 – 2015-04-15 (×2): 0.25 mg via RESPIRATORY_TRACT
  Filled 2015-04-14 (×4): qty 2

## 2015-04-14 MED ORDER — CLOPIDOGREL BISULFATE 75 MG PO TABS
75.0000 mg | ORAL_TABLET | Freq: Every day | ORAL | Status: DC
  Administered 2015-04-14 – 2015-04-15 (×2): 75 mg via ORAL
  Filled 2015-04-14 (×2): qty 1

## 2015-04-14 MED ORDER — HEPARIN SODIUM (PORCINE) 5000 UNIT/ML IJ SOLN
5000.0000 [IU] | Freq: Three times a day (TID) | INTRAMUSCULAR | Status: DC
  Administered 2015-04-14 – 2015-04-15 (×2): 5000 [IU] via SUBCUTANEOUS
  Filled 2015-04-14 (×3): qty 1

## 2015-04-14 MED ORDER — NITROGLYCERIN 0.4 MG SL SUBL: 0.4000 mg | SUBLINGUAL_TABLET | SUBLINGUAL | Status: DC | PRN

## 2015-04-14 MED ORDER — PANTOPRAZOLE SODIUM 40 MG PO TBEC
40.0000 mg | DELAYED_RELEASE_TABLET | Freq: Two times a day (BID) | ORAL | Status: DC
  Administered 2015-04-14 – 2015-04-15 (×3): 40 mg via ORAL
  Filled 2015-04-14 (×3): qty 1

## 2015-04-14 MED ORDER — INSULIN ASPART 100 UNIT/ML ~~LOC~~ SOLN: 0.0000 [IU] | Freq: Three times a day (TID) | SUBCUTANEOUS | Status: DC

## 2015-04-14 MED ORDER — ALPRAZOLAM 0.25 MG PO TABS: 0.5000 mg | ORAL_TABLET | Freq: Two times a day (BID) | ORAL | Status: DC

## 2015-04-14 MED ORDER — ALPRAZOLAM 0.5 MG PO TABS
0.5000 mg | ORAL_TABLET | Freq: Every day | ORAL | Status: DC
  Administered 2015-04-14 – 2015-04-15 (×2): 0.5 mg via ORAL
  Filled 2015-04-14 (×2): qty 1

## 2015-04-14 MED ORDER — ALBUTEROL SULFATE (2.5 MG/3ML) 0.083% IN NEBU: 2.5000 mg | INHALATION_SOLUTION | RESPIRATORY_TRACT | Status: DC | PRN

## 2015-04-14 MED ORDER — GABAPENTIN 100 MG PO CAPS
100.0000 mg | ORAL_CAPSULE | Freq: Every day | ORAL | Status: DC
  Administered 2015-04-14 – 2015-04-15 (×2): 100 mg via ORAL
  Filled 2015-04-14 (×2): qty 1

## 2015-04-14 MED ORDER — BUDESONIDE-FORMOTEROL FUMARATE 160-4.5 MCG/ACT IN AERO
2.0000 | INHALATION_SPRAY | Freq: Two times a day (BID) | RESPIRATORY_TRACT | Status: DC
  Filled 2015-04-14 (×2): qty 6

## 2015-04-14 MED ORDER — PREDNISONE 20 MG PO TABS
40.0000 mg | ORAL_TABLET | Freq: Every day | ORAL | Status: DC
  Administered 2015-04-14: 40 mg via ORAL
  Filled 2015-04-14 (×2): qty 2

## 2015-04-14 MED ORDER — ATORVASTATIN CALCIUM 20 MG PO TABS
20.0000 mg | ORAL_TABLET | Freq: Every day | ORAL | Status: DC
  Administered 2015-04-14 – 2015-04-15 (×2): 20 mg via ORAL
  Filled 2015-04-14 (×2): qty 1

## 2015-04-14 MED ORDER — LINAGLIPTIN 5 MG PO TABS
5.0000 mg | ORAL_TABLET | Freq: Every day | ORAL | Status: DC
  Administered 2015-04-14 – 2015-04-15 (×2): 5 mg via ORAL
  Filled 2015-04-14 (×2): qty 1

## 2015-04-14 MED ORDER — GABAPENTIN 100 MG PO CAPS: 100.0000 mg | ORAL_CAPSULE | Freq: Two times a day (BID) | ORAL | Status: DC

## 2015-04-14 MED ORDER — GABAPENTIN 100 MG PO CAPS
200.0000 mg | ORAL_CAPSULE | Freq: Every day | ORAL | Status: DC
Start: 2015-04-14 — End: 2015-04-15
  Administered 2015-04-14: 200 mg via ORAL
  Filled 2015-04-14 (×2): qty 2

## 2015-04-14 MED ORDER — ALBUTEROL SULFATE HFA 108 (90 BASE) MCG/ACT IN AERS: 2.0000 | INHALATION_SPRAY | Freq: Four times a day (QID) | RESPIRATORY_TRACT | Status: DC | PRN

## 2015-04-14 MED ORDER — AMLODIPINE BESYLATE 5 MG PO TABS
5.0000 mg | ORAL_TABLET | Freq: Every day | ORAL | Status: DC
  Administered 2015-04-14 – 2015-04-15 (×2): 5 mg via ORAL
  Filled 2015-04-14 (×2): qty 1

## 2015-04-14 NOTE — Progress Notes (Addendum)
New Admission Note:   Arrival Method: stretcher Mental Orientation: alert and oriented x 4 Telemetry:27 Assessment: Skin:dry flaky    Sacrum red removed depend  But blanchable XM:DEKIYJ lock right IV Pain:denies Tubes:none Safety Measures: Safety Fall Prevention Plan discussed and bed alarm set Admission:[facility] [address] Orientation: Patient has been orientated to the room, unit and staff.  Family:daughter not available   Orders have been reviewed and implemented. Will continue to monitor the patient. Call light has been placed within reach and bed alarm has been activated.   Amado Coe, RN Phone number: [phone]

## 2015-04-14 NOTE — Progress Notes (Addendum)
PROGRESS NOTE  Madison Henson RFF:[id number] DOB: 1928-10-06 DOA: 04/13/2015 PCP: Delphina Cahill, MD  Brief History  81 yo female with history of COPD, diabetes mellitus, CAD, HTN, continued Tobacco abuse, Hyperlipidemia, LBBB, DM, GERD, anxiety/depression. 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 A new DES was placed at that time. Notably, the patient had a nuclear medicine stress test on 04/02/2014 that was interpreted to be Intermediate risk stress nuclear study with a mostly fixed anteroseptal and apical fixed defect, consistent with possible scar or rate-related LBBB artifact. EKG shows sinus rhythm with LBBB.  Last cardiac cath 07/09/11 which showed mild to moderate stable CAD. She had a right lower lobectomy for stage IA non-small cell lung cancer per Dr. Arlyce Dice in October 2012. She has been on Plavix chronically with first generation DES.  The patient presented to the hospital on 04/13/2015 with 1 week history of chest pain and shortness of breath. She states that the chest pain occurs at rest and is usually worsened with deep inspiration or coughing. She states that the chest pain would last less than 10 seconds. Assessment/Plan: COPD exacerbation  -Start IV Solu-Medrol as the patient continues to have significant wheezing  -Aerosolized albuterol and atrovent  -Pulmonary hygiene -Start Pulmicort  -pt is on 3L at night at home  -start azithromycin Atypical chest pain  -EKG -Cycle troponins--negative x 3 -Chest x-ray-- no infiltrates -Continue Plavix  -D-dimer -reproducible on palpation -no exertional chest pain Tobacco abuse  -Tobacco cessation discussed  Hypertension  -Continue amlodipine Hyperlipidemia  -Continue statin  Diabetes mellitus type 2, controlled  -Continue Tradjenta -NovoLog sliding scale while pt is on steroids  CAD  -continue plavix  -04/02/2014 Myoview intermediate  risk with EF 53% Anxiety -Continue Xanax home dose AAA -MRI in June 2011 which showed a 3.6 x 3.6 -04/13/2015 ultrasound shows slight enlargement--4.1 cm x 4.1 cm -She is a poor surgical candidate -Monitor clinically for now   Family Communication:   Pt at beside Disposition Plan:   Home 1-2 days       Procedures/Studies: Dg Chest 2 View  04/14/2015   CLINICAL DATA:  Cough and lower chest pain for 1 day.  EXAM: CHEST  2 VIEW  COMPARISON:  07/30/2014  FINDINGS: Postsurgical change in the right hemithorax with clips at the hila and volume loss. There is no consolidation, pleural effusion or pneumothorax. The heart size is normal, tortuosity of the thoracic aorta. No acute osseous abnormalities are seen.  IMPRESSION: Postsurgical change in the right hemithorax. No acute pulmonary process.   Electronically Signed   By: Jeb Levering M.D.   On: 04/14/2015 01:04   US Aorta  04/14/2015   CLINICAL DATA:  Epigastric and chest pain for 1 day. Known infrarenal abdominal aortic aneurysm. History of hypertension, diabetes and lung cancer.  EXAM: ULTRASOUND OF ABDOMINAL AORTA  TECHNIQUE: Ultrasound examination of the abdominal aorta was performed to evaluate for abdominal aortic aneurysm.  COMPARISON:  CT abdomen and pelvis July 25, 2014  FINDINGS: Abdominal Aorta  Re- demonstration of abdominal aorta wall aneurysm with echogenic luminal calcifications and, intimal hematoma which was present on prior CT. The aorta measured up to 4.1 x 4.1 cm on prior CT.  Proximal:  2.7 x 3 cm.  Mid:  3.1 x 3.9 cm.  Distal: 4.2 x 4.5 cm.  RIGHT Common iliac:  10 x 14 mm.  LEFT  Common iliac:  11 x 10 mm.  IMPRESSION: Re- demonstration of abdominal aortic aneurysm, measuring slightly larger than prior CT, which could be technical or, represent interval growth.   Electronically Signed   By: Elon Alas M.D.   On: 04/14/2015 04:14         Subjective:  Patient states that she is breathing 25% better. She is  having dyspnea on exertion. She states that chest pain is worse with inspiration or coughing. Denies any nausea, vomiting, diarrhea, abdominal pain, dysuria, hematuria. No rashes.  Objective: Filed Vitals:   04/14/15 7371 04/14/15 0924 04/14/15 0938 04/14/15 1347  BP: 117/46  113/47   Pulse: 75  90   Temp: 97.9 F (36.6 C)  98 F (36.7 C)   TempSrc: Oral  Oral   Resp: 12  16   Weight: 86.138 kg (189 lb 14.4 oz)     SpO2: 92% 91% 94% 93%    Intake/Output Summary (Last 24 hours) at 04/14/15 1600 Last data filed at 04/14/15 1326  Gross per 24 hour  Intake    240 ml  Output    400 ml  Net   -160 ml   Weight change:  Exam:   General:  Pt is alert, follows commands appropriately, not in acute distress  HEENT: No icterus, No thrush,  [place]/AT  Cardiovascular: RRR, S1/S2, no rubs, no gallops  Respiratory: bilateral expiratory wheeze. Good air movement.  Abdomen: Soft/+BS, non tender, non distended, no guarding  Extremities: trace LE edema, No lymphangitis, No petechiae, No rashes, no synovitis  Data Reviewed: Basic Metabolic Panel:  Recent Labs Lab 04/14/15 0018  NA 142  K 3.6  CL 109  CO2 26  GLUCOSE 102*  BUN 10  CREATININE 1.17*  CALCIUM 8.4*   Liver Function Tests:  Recent Labs Lab 04/14/15 0018  AST 12*  ALT 9*  ALKPHOS 80  BILITOT 0.6  PROT 6.0*  ALBUMIN 3.3*    Recent Labs Lab 04/14/15 0018  LIPASE 18*   No results for input(s): AMMONIA in the last 168 hours. CBC:  Recent Labs Lab 04/14/15 0018  WBC 7.1  NEUTROABS 3.9  HGB 12.0  HCT 36.9  MCV 91.6  PLT 206   Cardiac Enzymes:  Recent Labs Lab 04/14/15 0018 04/14/15 0407 04/14/15 1235  TROPONINI <0.03 <0.03 <0.03   BNP: Invalid input(s): POCBNP CBG:  Recent Labs Lab 04/14/15 0725 04/14/15 1214  GLUCAP 165* 181*    No results found for this or any previous visit (from the past 240 hour(s)).   Scheduled Meds: . ALPRAZolam  0.5 mg Oral Daily   And  . ALPRAZolam  1 mg  Oral QHS  . amLODipine  5 mg Oral Daily  . atorvastatin  20 mg Oral Daily  . budesonide-formoterol  2 puff Inhalation BID  . clopidogrel  75 mg Oral Daily  . gabapentin  100 mg Oral Daily   And  . gabapentin  200 mg Oral QHS  . heparin  5,000 Units Subcutaneous 3 times per day  . ipratropium-albuterol  3 mL Nebulization Q6H  . levothyroxine  125 mcg Oral QAC breakfast  . linagliptin  5 mg Oral Daily  . nitrofurantoin  50 mg Oral BID  . pantoprazole  40 mg Oral BID  . polyethylene glycol  17 g Oral Daily  . predniSONE  40 mg Oral Q breakfast   Continuous Infusions:    Chayse Zatarain, DO  [facility] Pager [phone]  If 7PM-7AM, please contact night-coverage  [email or web address] Password [email or web address] 04/14/2015, 4:00 PM   LOS: 0 days

## 2015-04-14 NOTE — ED Notes (Signed)
Dr. Gardner at bedside 

## 2015-04-14 NOTE — ED Notes (Signed)
This rn called to determine status of bed assignment and was informed that pt placement is waiting for a bed on 6E.

## 2015-04-14 NOTE — H&P (Signed)
[facility] History and Physical  Madison Henson IRC:[id number] DOB: [birth year] DOA: 04/13/2015  Referring physician: EDP PCP: Delphina Cahill, MD   Chief Complaint: Chest pain   HPI: Madison Henson is a 81 y.o. female with h/o CAD, HTN, CHF, AAA, COPD, lung CA, patient presents to ED with c/o constant, worsening epigastric abdominal pain and chest pain.  Symptoms onset 1 day ago, have been worsening, associated with productive cough of greenish mucus.  Pain is worse with deep breathing.  NTG given by EMS didn't provide any improvement.  No fever, is on O2 at night time only.  PMH includes coronary stent placement in 2004 and 2008.  Currently being treated for URI.  Review of Systems: Systems reviewed.  As above, otherwise negative  Past Medical History  Diagnosis Date  . Coronary artery disease     post diaphragmatic wall infarction on 06-2007,due to stent thrombosis   . Chronic obstructive pulmonary disease   . Tobacco abuse   . Hypertension   . Hyperlipidemia   . GERD (gastroesophageal reflux disease)   . Anxiety disorder   . Depression   . Hypothyroidism   . Hx of hysterectomy   . AAA (abdominal aortic aneurysm)   . Pulmonary nodule 04/2011    neg bx, followed by Dr. Arlyce Dice, may still be cancer, being followed with CTs.  . Esophageal dysmotility     Two BPE since 03/2011-->Moderate impairment of esophageal motility.Vallecular residuals. Laryngeal penetration  . Anemia   . Diabetes mellitus     with gastroparesis 70% retention at 2 hours  . Cancer   . Lung cancer   . CHF (congestive heart failure)   . Myocardial infarction   . AAA (abdominal aortic aneurysm) without rupture 09/25/2013   Past Surgical History  Procedure Laterality Date  . Cholecystectomy    . Cataract surg    . Bladder surgery      tack  . Kidney stone surgery    . Coronary stent placement  2004    2  . Coronary stent placement  2008    2  . S/p hysterectomy    . Neck surgery    .  Esophagogastroduodenoscopy  5/08    normal esophagus, No H.Pylori  . Egd/bravo  07/2008    On Nexium BID. Day 1 DMSTR Score: 0.7, day 2: DMSTR score: 32, uncontrolled GERD, No H. Pylori  . Hbt  2009    negative  . Colonoscopy  05/2010    tortuous sigm colon, sigm tics, multiple simple adenomas, hemorrhoids, next TCS 10-15 yrs per Dr. Oneida Alar. Previous h/o tubular adenomas in 2008.   Marland Kitchen Esophagogastroduodenoscopy  05/2010    small hh, streaky erythema in body/antrum. Duodenal bx negative.   . Esophagogastroduodenoscopy  06/22/2011    mild gastritis/ring in the distal esophagus, savary dilation. Bx reactive gastropathy, No H.pylori  . Savory dilation  06/22/2011    Procedure: SAVORY DILATION;  Surgeon: Dorothyann Peng, MD;  Location: [facility];  Service: Endoscopy;  Laterality: N/A;  [name] dilation  06/22/2011    Procedure: [name] DILATION;  Surgeon: Dorothyann Peng, MD;  Location: [facility];  Service: Endoscopy;  Laterality: N/A;  . Video brochoscopy with electromagnetic navigaton  05/10/2011    [name]  . Right lower lobe superior segmentectomy  October 2012  . Hysterectomy at age 44    . Cardiac catheterization    . Abdominal hysterectomy    . Esophagogastroduodenoscopy N/A 08/14/2013    Procedure:  ESOPHAGOGASTRODUODENOSCOPY (EGD);  Surgeon: Danie Binder, MD;  Location: [facility];  Service: Endoscopy;  Laterality: N/A;  2:45  . Colonoscopy N/A 11/02/2013    Procedure: COLONOSCOPY;  Surgeon: Danie Binder, MD;  Location: [facility];  Service: Endoscopy;  Laterality: N/A;  1:15  . Hemorrhoid banding N/A 11/02/2013    Procedure: HEMORRHOID BANDING;  Surgeon: Danie Binder, MD;  Location: [facility];  Service: Endoscopy;  Laterality: N/A;  . Agile capsule N/A 11/12/2013    Procedure: AGILE CAPSULE;  Surgeon: Danie Binder, MD;  Location: [facility];  Service: Endoscopy;  Laterality: N/A;  8:00  . Bacterial overgrowth test N/A 11/16/2013    Procedure: BACTERIAL  OVERGROWTH TEST;  Surgeon: Danie Binder, MD;  Location: [facility];  Service: Endoscopy;  Laterality: N/A;  8:00  . Wide local excision of skin lesion with complex reconstruction Left 04/26/2014    DR TAT  . Mass excision N/A 04/26/2014    Procedure: WIDE LOCAL EXCISION OF NECK SKIN LESION/IMMEDIATE SUBCUTANEOUS TISSUE WITH LOCAL RECONSTRUCTION;  Surgeon: Jerrell Belfast, MD;  Location: [facility] OR;  Service: ENT;  Laterality: N/A;   Social History:  reports that she has been smoking Cigarettes.  She has a 20 pack-year smoking history. She has never used smokeless tobacco. She reports that she does not drink alcohol or use illicit drugs.  Allergies  Allergen Reactions  . Linzess [Linaclotide] Other (See Comments)    EXPLOSIVE DIARRHEA  . Oxycodone Other (See Comments)    Causes hyperactivity   . Sulfonamide Derivatives Swelling and Rash    Hands & feet swell  . Hydrocodone Other (See Comments)    Causes Hyperactivity     Family History  Problem Relation Age of Onset  . Coronary artery disease      family hx of  . Colon cancer Sister 13  . Colon cancer Daughter   . Prostate cancer      family hx of  . Lung cancer      family hx of  . Ovarian cancer      family hx of  . Arthritis      family hx of  . Other      family hx of chronic respiratory condition  . Heart disease Mother   . Stroke Mother      Prior to Admission medications   Medication Sig Start Date End Date Taking? Authorizing Provider  albuterol (PROAIR HFA) 108 (90 BASE) MCG/ACT inhaler Inhale 2 puffs into the lungs every 6 (six) hours as needed for wheezing.     Historical Provider, MD  albuterol (PROVENTIL) (2.5 MG/3ML) 0.083% nebulizer solution Take 2.5 mg by nebulization every 4 (four) hours as needed for wheezing or shortness of breath.    Historical Provider, MD  ALPRAZolam Duanne Moron) 1 MG tablet Take 0.5-1 mg by mouth 2 (two) times daily. Take 0.5 mg in am and 1 mg in pm    Historical Provider, MD  amLODipine  (NORVASC) 5 MG tablet Take 5 mg by mouth daily.    Historical Provider, MD  atorvastatin (LIPITOR) 20 MG tablet Take 20 mg by mouth daily.     Historical Provider, MD  budesonide-formoterol (SYMBICORT) 160-4.5 MCG/ACT inhaler Inhale 2 puffs into the lungs 2 (two) times daily. 05/02/14   Debbe Odea, MD  clopidogrel (PLAVIX) 75 MG tablet TAKE 1 TABLET DAILY 10/15/14   Burnell Blanks, MD  cyanocobalamin (,VITAMIN B-12,) 1000 MCG/ML injection Inject 1,000 mcg into the  muscle every 30 (thirty) days.    Historical Provider, MD  docusate sodium (COLACE) 100 MG capsule Take 100 mg by mouth daily as needed for mild constipation.    Historical Provider, MD  gabapentin (NEURONTIN) 100 MG capsule Take 100 mg by mouth. TAKES 100 MG IN THE AM AND 200 MG AT NIGHT    Historical Provider, MD  JANUVIA 50 MG tablet Take 50 mg by mouth daily.  07/04/12   Historical Provider, MD  levothyroxine (SYNTHROID, LEVOTHROID) 125 MCG tablet Take 125 mcg by mouth daily before breakfast.    Historical Provider, MD  nitroGLYCERIN (NITROSTAT) 0.4 MG SL tablet Place 0.4 mg under the tongue every 5 (five) minutes as needed for chest pain.    Historical Provider, MD  pantoprazole (PROTONIX) 40 MG tablet Take 1 tablet (40 mg total) by mouth 2 (two) times daily. 03/18/15   Carlis Stable, NP  polyethylene glycol (MIRALAX / GLYCOLAX) packet Take 17 g by mouth daily. 12/26/13   Davonna Belling, MD   Physical Exam: Filed Vitals:   04/14/15 0230  BP: 107/34  Pulse: 66  Temp:   Resp: 14    BP 107/34 mmHg  Pulse 66  Temp(Src) 97.7 F (36.5 C) (Oral)  Resp 14  SpO2 92%  General Appearance:    Alert, oriented, no distress, appears stated age  Head:    Normocephalic, atraumatic  Eyes:    PERRL, EOMI, sclera non-icteric        Nose:   Nares without drainage or epistaxis. Mucosa, turbinates normal  Throat:   Moist mucous membranes. Oropharynx without erythema or exudate.  Neck:   Supple. No carotid bruits.  No thyromegaly.  No  lymphadenopathy.   Back:     No CVA tenderness, no spinal tenderness  Lungs:     Coarse breath sounds bilaterally.  Chest wall:    No tenderness to palpitation  Heart:    Regular rate and rhythm without murmurs, gallops, rubs  Abdomen:     Soft, non-tender, nondistended, normal bowel sounds, no organomegaly  Genitalia:    deferred  Rectal:    deferred  Extremities:   No clubbing, cyanosis or edema.  Pulses:   2+ and symmetric all extremities  Skin:   Skin color, texture, turgor normal, no rashes or lesions  Lymph nodes:   Cervical, supraclavicular, and axillary nodes normal  Neurologic:   CNII-XII intact. Normal strength, sensation and reflexes      throughout    Labs on Admission:  Basic Metabolic Panel:  Recent Labs Lab 04/14/15 0018  NA 142  K 3.6  CL 109  CO2 26  GLUCOSE 102*  BUN 10  CREATININE 1.17*  CALCIUM 8.4*   Liver Function Tests:  Recent Labs Lab 04/14/15 0018  AST 12*  ALT 9*  ALKPHOS 80  BILITOT 0.6  PROT 6.0*  ALBUMIN 3.3*    Recent Labs Lab 04/14/15 0018  LIPASE 18*   No results for input(s): AMMONIA in the last 168 hours. CBC:  Recent Labs Lab 04/14/15 0018  WBC 7.1  NEUTROABS 3.9  HGB 12.0  HCT 36.9  MCV 91.6  PLT 206   Cardiac Enzymes:  Recent Labs Lab 04/14/15 0018  TROPONINI <0.03    BNP (last 3 results) No results for input(s): PROBNP in the last 8760 hours. CBG: No results for input(s): GLUCAP in the last 168 hours.  Radiological Exams on Admission: Dg Chest 2 View  04/14/2015   CLINICAL DATA:  Cough and lower  chest pain for 1 day.  EXAM: CHEST  2 VIEW  COMPARISON:  07/30/2014  FINDINGS: Postsurgical change in the right hemithorax with clips at the hila and volume loss. There is no consolidation, pleural effusion or pneumothorax. The heart size is normal, tortuosity of the thoracic aorta. No acute osseous abnormalities are seen.  IMPRESSION: Postsurgical change in the right hemithorax. No acute pulmonary process.    Electronically Signed   By: Jeb Levering M.D.   On: 04/14/2015 01:04    EKG: Independently reviewed.  Assessment/Plan Principal Problem:   COPD exacerbation Active Problems:   AAA (abdominal aortic aneurysm) without rupture   Diabetes mellitus, type 2   Pleuritic chest pain   History of lung cancer   1. COPD exacerbation - 1. Adult wheeze protocol 2. O2 as needed 3. Prednisone PO 4. Patient needs to quit smoking! (discussed with patient). 2. H/O lung CA - RLL superior segmentectomy in October 2012, no known recurrence, last CT chest was 01/03/15 at [facility]. 3. Pleuritic chest pain - suspect this is most likely due to her COPD exacerbation given the classic pleuritic presentation 1. With that said, she is due for her 1 year follow up US of her AAA as recommended on her CT scan last year (and according to her last cards office visit note), so will go ahead and order this to make sure this hasnt enlarged / changed, etc (if so she sees Dr. Donnetta Hutching with [facility]). 2. No ischemia on stress myoview in 2015 3. Will go ahead and check serial trops just in case, but feel cardiac etiology is less likely a cause here. 4. DM2 - continue home meds 1. CBG checks AC/HS    Code Status: Full Code  Family Communication: No family in room Disposition Plan: Admit to inpatient   Time spent: 70 min  GARDNER, JARED M. [facility] Pager [phone]  If 7AM-7PM, please contact the day team taking care of the patient [email or web address] Password [email or web address] 04/14/2015, 3:17 AM

## 2015-04-15 DIAGNOSIS — E1165 Type 2 diabetes mellitus with hyperglycemia: Secondary | ICD-10-CM

## 2015-04-15 LAB — BASIC METABOLIC PANEL
Anion gap: 9 (ref 5–15)
BUN: 17 mg/dL (ref 6–20)
CHLORIDE: 105 mmol/L (ref 101–111)
CO2: 26 mmol/L (ref 22–32)
Calcium: 8.9 mg/dL (ref 8.9–10.3)
Creatinine, Ser: 1.09 mg/dL — ABNORMAL HIGH (ref 0.44–1.00)
GFR calc Af Amer: 52 mL/min — ABNORMAL LOW (ref >60)
GFR, EST NON AFRICAN AMERICAN: 45 mL/min — AB (ref >60)
Glucose, Bld: 176 mg/dL — ABNORMAL HIGH (ref 65–99)
POTASSIUM: 3.8 mmol/L (ref 3.5–5.1)
Sodium: 140 mmol/L (ref 135–145)

## 2015-04-15 LAB — GLUCOSE, CAPILLARY
Glucose-Capillary: 190 mg/dL — ABNORMAL HIGH (ref 65–99)
Glucose-Capillary: 135 mg/dL — ABNORMAL HIGH (ref 65–99)

## 2015-04-15 MED ORDER — AZITHROMYCIN 500 MG PO TABS: 500.0000 mg | ORAL_TABLET | Freq: Every day | ORAL | Status: DC

## 2015-04-15 MED ORDER — PREDNISONE 10 MG PO TABS: 60.0000 mg | ORAL_TABLET | Freq: Every day | ORAL | Status: DC

## 2015-04-15 NOTE — Care Management Note (Signed)
Case Management Note  Patient Details  Name: Madison Henson MRN: [id number] Date of Birth: 02/14/1928  Subjective/Objective:                 CM following for progression and d/c planning.   Action/Plan: 04/15/2015 Spoke briefly with pt who is ambulatory and moving about in her room, no d/c need ordered or identified at this time.   Expected Discharge Date:       04/15/2015           Expected Discharge Plan:  Home/Self Care  In-House Referral:  NA  Discharge planning Services  NA  Post Acute Care Choice:  NA Choice offered to:  NA  DME Arranged:    DME Agency:     HH Arranged:    HH Agency:     Status of Service:  Completed, signed off  Medicare Important Message Given:  No Date Medicare IM Given:    Medicare IM give by:    Date Additional Medicare IM Given:    Additional Medicare Important Message give by:     If discussed at [place] of Stay Meetings, dates discussed:    Additional Comments:  Adron Bene, RN 04/15/2015, 12:23 PM

## 2015-04-15 NOTE — Discharge Summary (Signed)
Physician Discharge Summary  Madison Henson SEG:[id number] DOB: [birth year] DOA: 04/13/2015  PCP: Delphina Cahill, MD  Admit date: 04/13/2015 Discharge date: 04/15/2015  Recommendations for Outpatient Follow-up:  1. Pt will need to follow up with PCP in 2 weeks post discharge 2. Please follow up with vascular surgery regarding her yearly follow-up for her abdominal aortic aneurysm  Discharge Diagnoses:  COPD exacerbation  -Start IV Solu-Medrol as the patient continues to have significant wheezing-->clinically improved after 24 hours of IV steroids  -Patient will go home with prednisone taper starting with 60 mg daily, decrease by 10 mg daily -Aerosolized albuterol and atrovent  -Pulmonary hygiene -Started Pulmicort -->clinical improvement -Continue Symbicort after discharge -pt is on 3L at night at home  -start azithromycin--3 more days after d/c Atypical chest pain  -EKG--unchanged left bundle branch block -Cycle troponins--negative x 3 -Chest x-ray-- no infiltrates -Continue Plavix  -D-dimer--1.55, but clinical suspicion of PE is low as pt had significant clinical improvement with treatment of COPD exacerbation and clinical exam more consistent with musculoskeletal pain -reproducible on palpation -no exertional chest pain Tobacco abuse  -Tobacco cessation discussed  Hypertension  -Continue amlodipine Hyperlipidemia  -Continue statin  Diabetes mellitus type 2, controlled  -Continue Tradjenta(januvia at home) -NovoLog sliding scale while pt is on steroids  CAD  -continue plavix  -04/02/2014 Myoview intermediate risk with EF 53% Anxiety -Continue Xanax home dose AAA -MRI in June 2011 which showed a 3.6 x 3.6 -04/13/2015 ultrasound shows slight enlargement--4.1 cm x 4.1 cm -She is a poor surgical candidate -Monitor clinically for now  -follow up with vascular surgery as outpt  Discharge Condition: stable  Disposition: home  Diet:carb modified Wt Readings  from Last 3 Encounters:  04/14/15 86.183 kg (190 lb)  02/19/15 87.544 kg (193 lb)  08/19/14 89.359 kg (197 lb)    History of present illness:  81 yo female with history of COPD, diabetes mellitus, CAD, HTN, continued Tobacco abuse, Hyperlipidemia, LBBB, DM, GERD, anxiety/depression. 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 A new DES was placed at that time. Notably, the patient had a nuclear medicine stress test on 04/02/2014 that was interpreted to be Intermediate risk stress nuclear study with a mostly fixed anteroseptal and apical fixed defect, consistent with possible scar or rate-related LBBB artifact. EKG shows sinus rhythm with LBBB. Last cardiac cath 07/09/11 which showed mild to moderate stable CAD. She had a right lower lobectomy for stage IA non-small cell lung cancer per Dr. Arlyce Dice in October 2012. She has been on Plavix chronically with first generation DES.  The patient presented to the hospital on 04/13/2015 with 1 week history of chest pain and shortness of breath. She states that the chest pain occurs at rest and is usually worsened with deep inspiration or coughing. She states that the chest pain would last less than 10 seconds. Workup in the hospital revealed COPD exacerbation. Although the patient had an elevated d-dimer, clinical suspicion of pulmonary embolus was low. The patient has significant clinical improvement with treatment for COPD exacerbation. The patient's chest pain was reproducible. Her chest pain improved, and her workup for acute coronary syndrome was negative. Her EKG was unchanged. The patient will be discharged home with azithromycin for 3 additional days and a prednisone taper. Tobacco cessation was again discussed.    Discharge Exam: Filed Vitals:   04/15/15 1000  BP: 103/48  Pulse: 75  Temp: 98.2 F (36.8 C)  Resp:  [place]:   04/15/15 0147 04/15/15 0412 04/15/15 0913  04/15/15 1000  BP:  117/43  103/48  Pulse: 78 72  75  Temp:  98 F (36.7 C)  98.2 F (36.8 C)  TempSrc:    Oral  Resp: '18 19  20  '$ Weight:      SpO2: 91% 95% 94% 98%   General: A&O x 3, NAD, pleasant, cooperative Cardiovascular: RRR, no rub, no gallop, no S3 Respiratory: Bibasilar rales. No wheezing. Good air movement. Abdomen:soft, mild epigastric discomfort without any rebound., nondistended, positive bowel sounds Extremities: trace LE edema, No lymphangitis, no petechiae  Discharge Instructions     Medication List    STOP taking these medications        nitrofurantoin 50 MG capsule  Commonly known as:  MACRODANTIN      TAKE these medications        ALPRAZolam 1 MG tablet  Commonly known as:  XANAX  Take 0.5-1 mg by mouth 2 (two) times daily. Take 0.5 mg in am and 1 mg in pm     amLODipine 5 MG tablet  Commonly known as:  NORVASC  Take 5 mg by mouth daily.     atorvastatin 20 MG tablet  Commonly known as:  LIPITOR  Take 20 mg by mouth daily.     azithromycin 500 MG tablet  Commonly known as:  ZITHROMAX  Take 1 tablet (500 mg total) by mouth daily.  Start taking on:  04/16/2015     budesonide-formoterol 160-4.5 MCG/ACT inhaler  Commonly known as:  SYMBICORT  Inhale 2 puffs into the lungs 2 (two) times daily.     clopidogrel 75 MG tablet  Commonly known as:  PLAVIX  TAKE 1 TABLET DAILY     gabapentin 100 MG capsule  Commonly known as:  NEURONTIN  Take 100 mg by mouth. TAKES 100 MG IN THE AM AND 200 MG AT NIGHT     JANUVIA 50 MG tablet  Generic drug:  sitaGLIPtin  Take 50 mg by mouth daily.     levothyroxine 125 MCG tablet  Commonly known as:  SYNTHROID, LEVOTHROID  Take 125 mcg by mouth daily before breakfast.     nitroGLYCERIN 0.4 MG SL tablet  Commonly known as:  NITROSTAT  Place 0.4 mg under the tongue every 5 (five) minutes as needed for chest pain.     pantoprazole 40 MG tablet  Commonly known as:  PROTONIX  Take 1 tablet (40 mg total) by  mouth 2 (two) times daily.     polyethylene glycol packet  Commonly known as:  MIRALAX / GLYCOLAX  Take 17 g by mouth daily.     predniSONE 10 MG tablet  Commonly known as:  DELTASONE  Take 6 tablets (60 mg total) by mouth daily with breakfast. Start 04/16/15 and decrease by 1 tablet daily  Start taking on:  04/16/2015     albuterol (2.5 MG/3ML) 0.083% nebulizer solution  Commonly known as:  PROVENTIL  Take 2.5 mg by nebulization every 4 (four) hours as needed for wheezing or shortness of breath.     PROAIR HFA 108 (90 BASE) MCG/ACT inhaler  Generic drug:  albuterol  Inhale 2 puffs into the lungs every 6 (six) hours as needed for wheezing.         The results of significant diagnostics from this hospitalization (including imaging, microbiology, ancillary and laboratory) are listed below for reference.    Significant Diagnostic Studies: Dg Chest 2 View  04/14/2015   CLINICAL DATA:  Cough and lower chest pain for 1 day.  EXAM: CHEST  2 VIEW  COMPARISON:  07/30/2014  FINDINGS: Postsurgical change in the right hemithorax with clips at the hila and volume loss. There is no consolidation, pleural effusion or pneumothorax. The heart size is normal, tortuosity of the thoracic aorta. No acute osseous abnormalities are seen.  IMPRESSION: Postsurgical change in the right hemithorax. No acute pulmonary process.   Electronically Signed   By: Jeb Levering M.D.   On: 04/14/2015 01:04   US Aorta  04/14/2015   CLINICAL DATA:  Epigastric and chest pain for 1 day. Known infrarenal abdominal aortic aneurysm. History of hypertension, diabetes and lung cancer.  EXAM: ULTRASOUND OF ABDOMINAL AORTA  TECHNIQUE: Ultrasound examination of the abdominal aorta was performed to evaluate for abdominal aortic aneurysm.  COMPARISON:  CT abdomen and pelvis July 25, 2014  FINDINGS: Abdominal Aorta  Re- demonstration of abdominal aorta wall aneurysm with echogenic luminal calcifications and, intimal hematoma which  was present on prior CT. The aorta measured up to 4.1 x 4.1 cm on prior CT.  Proximal:  2.7 x 3 cm.  Mid:  3.1 x 3.9 cm.  Distal: 4.2 x 4.5 cm.  RIGHT Common iliac:  10 x 14 mm.  LEFT Common iliac:  11 x 10 mm.  IMPRESSION: Re- demonstration of abdominal aortic aneurysm, measuring slightly larger than prior CT, which could be technical or, represent interval growth.   Electronically Signed   By: Elon Alas M.D.   On: 04/14/2015 04:14     Microbiology: No results found for this or any previous visit (from the past 240 hour(s)).   Labs: Basic Metabolic Panel:  Recent Labs Lab 04/14/15 0018 04/15/15 0550  NA 142 140  K 3.6 3.8  CL 109 105  CO2 26 26  GLUCOSE 102* 176*  BUN 10 17  CREATININE 1.17* 1.09*  CALCIUM 8.4* 8.9   Liver Function Tests:  Recent Labs Lab 04/14/15 0018  AST 12*  ALT 9*  ALKPHOS 80  BILITOT 0.6  PROT 6.0*  ALBUMIN 3.3*    Recent Labs Lab 04/14/15 0018  LIPASE 18*   No results for input(s): AMMONIA in the last 168 hours. CBC:  Recent Labs Lab 04/14/15 0018  WBC 7.1  NEUTROABS 3.9  HGB 12.0  HCT 36.9  MCV 91.6  PLT 206   Cardiac Enzymes:  Recent Labs Lab 04/14/15 0018 04/14/15 0407 04/14/15 1235 04/14/15 1904  TROPONINI <0.03 <0.03 <0.03 <0.03   BNP: Invalid input(s): POCBNP CBG:  Recent Labs Lab 04/14/15 1214 04/14/15 1646 04/14/15 2101 04/15/15 0748 04/15/15 1214  GLUCAP 181* 142* 155* 190* 135*    Time coordinating discharge:  Greater than 30 minutes  Signed:  Atilano Covelli, DO [facility] Pager: [phone] 04/15/2015, 1:13 PM

## 2015-04-16 LAB — HEMOGLOBIN A1C
Hgb A1c MFr Bld: 5.9 % — ABNORMAL HIGH (ref 4.8–5.6)
MEAN PLASMA GLUCOSE: 123 mg/dL

## 2015-04-21 ENCOUNTER — Other Ambulatory Visit: Payer: Self-pay

## 2015-04-22 NOTE — Telephone Encounter (Signed)
Please call the patient and tell him we got refil requests for his Norvasc and Plavix. These need to go to the prescriber (likely cardiology)

## 2015-04-24 NOTE — Telephone Encounter (Signed)
Open in error

## 2015-04-30 ENCOUNTER — Other Ambulatory Visit: Payer: Self-pay | Admitting: *Deleted

## 2015-04-30 MED ORDER — CLOPIDOGREL BISULFATE 75 MG PO TABS: 75.0000 mg | ORAL_TABLET | Freq: Every day | ORAL | Status: DC

## 2015-04-30 MED ORDER — AMLODIPINE BESYLATE 5 MG PO TABS: 5.0000 mg | ORAL_TABLET | Freq: Every day | ORAL | Status: AC

## 2015-05-12 ENCOUNTER — Other Ambulatory Visit: Payer: Self-pay

## 2015-08-29 ENCOUNTER — Ambulatory Visit (INDEPENDENT_AMBULATORY_CARE_PROVIDER_SITE_OTHER): Payer: Medicare Other | Admitting: Cardiovascular Disease

## 2015-08-29 ENCOUNTER — Encounter: Payer: Self-pay | Admitting: Cardiovascular Disease

## 2015-08-29 VITALS — BP 107/62 | HR 57 | Ht 68.0 in | Wt 179.1 lb

## 2015-08-29 DIAGNOSIS — E785 Hyperlipidemia, unspecified: Secondary | ICD-10-CM

## 2015-08-29 DIAGNOSIS — Z720 Tobacco use: Secondary | ICD-10-CM | POA: Diagnosis not present

## 2015-08-29 DIAGNOSIS — I2510 Atherosclerotic heart disease of native coronary artery without angina pectoris: Secondary | ICD-10-CM | POA: Diagnosis not present

## 2015-08-29 DIAGNOSIS — I10 Essential (primary) hypertension: Secondary | ICD-10-CM | POA: Diagnosis not present

## 2015-08-29 DIAGNOSIS — I714 Abdominal aortic aneurysm, without rupture, unspecified: Secondary | ICD-10-CM

## 2015-08-29 LAB — HEPATIC FUNCTION PANEL
ALK PHOS: 116 U/L (ref 33–130)
ALT: 9 U/L (ref 6–29)
AST: 10 U/L (ref 10–35)
Albumin: 3.3 g/dL — ABNORMAL LOW (ref 3.6–5.1)
BILIRUBIN INDIRECT: 0.5 mg/dL (ref 0.2–1.2)
BILIRUBIN TOTAL: 0.6 mg/dL (ref 0.2–1.2)
Bilirubin, Direct: 0.1 mg/dL (ref <=0.2)
Total Protein: 6.3 g/dL (ref 6.1–8.1)

## 2015-08-29 LAB — BASIC METABOLIC PANEL
BUN: 12 mg/dL (ref 7–25)
CO2: 26 mmol/L (ref 20–31)
Calcium: 9 mg/dL (ref 8.6–10.4)
Chloride: 104 mmol/L (ref 98–110)
Creat: 1.19 mg/dL — ABNORMAL HIGH (ref 0.60–0.88)
Glucose, Bld: 81 mg/dL (ref 65–99)
POTASSIUM: 4.1 mmol/L (ref 3.5–5.3)
SODIUM: 140 mmol/L (ref 135–146)

## 2015-08-29 LAB — LIPID PANEL
CHOL/HDL RATIO: 3.3 ratio (ref <=5.0)
CHOLESTEROL: 119 mg/dL — AB (ref 125–200)
HDL: 36 mg/dL — ABNORMAL LOW (ref >=46)
LDL Cholesterol: 66 mg/dL (ref <130)
TRIGLYCERIDES: 85 mg/dL (ref <150)
VLDL: 17 mg/dL (ref <30)

## 2015-08-29 NOTE — Patient Instructions (Addendum)
Medication Instructions:  Your physician recommends that you continue on your current medications as directed. Please refer to the Current Medication list given to you today.   Labwork: Lab work to be done today--BMP, Lipid and Liver profiles  Testing/Procedures: none  Follow-Up: Your physician wants you to follow-up in: 6 months. . You will receive a reminder letter in the mail two months in advance. If you don't receive a letter, please call our office to schedule the follow-up appointment.  Please call Dr. Luther Parody office to schedule a follow up appointment.  His phone number is [phone]  Any Other Special Instructions Will Be Listed Below (If Applicable).

## 2015-08-29 NOTE — Progress Notes (Signed)
Chief Complaint  Patient presents with  . Follow-up    CAD    History of Present Illness: 81 yo WF with history of CAD, HTN, Hyperlipidemia, DM, GERD, anxiety/depression here today for cardiac follow up. She has been followed in the past by Dr. Olevia Perches. 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 A new DES was also placed at that time. She previously had a Cypher stent in the circumflex artery as well. Event monitor several years ago with isolated PVCs and APCs. She has chronic shortness of breath related to her COPD. She had an MRI in June 2011 which showed a 3.6 x 3.6 cm infrarenal aortic aneurysm. She has chronic back pain felt to be secondary lumbar disc bulging but not felt to be a surgical candidate. She also has esophageal strictures and had an esophageal dilatation in 2012. She has been on Simvastatin for years. Her lipids are followed in primary care. Last cardiac cath 07/09/11 which showed mild to moderate stable CAD. She had a right lower lobectomy for stage IA non-small cell lung cancer per Dr. Arlyce Dice in October 2012. She has been on Plavix chronically with first generation DES. Her ASA was stopped in 2013. She has been continued on Plavix. Neck mass found in 2015 and has been resected, felt to be benign. Stress myoview May 2015 with no ischemia. She has had recent pneumonia and UTI. Kidney mass followed by Dr. Clyde Lundborg in [place]. She is not sure what the biopsy of the mass showed but she does not think it was cancer.   She is here today for follow up. She denies any chest pain or change in her breathing. She has continued to smoke 5-10 cigarettes per day.    Primary Care Physician: Dr. [place] Crane   Last Lipid Profile: Per primary care   Past Medical History  Diagnosis Date  . Coronary artery disease     post diaphragmatic wall infarction on 06-2007,due to stent thrombosis   . Chronic obstructive pulmonary disease ([place])   .  Tobacco abuse   . Hypertension   . Hyperlipidemia   . GERD (gastroesophageal reflux disease)   . Anxiety disorder   . Depression   . Hypothyroidism   . Hx of hysterectomy   . AAA (abdominal aortic aneurysm) ([place])   . Pulmonary nodule 04/2011    neg bx, followed by Dr. Arlyce Dice, may still be cancer, being followed with CTs.  . Esophageal dysmotility     Two BPE since 03/2011-->Moderate impairment of esophageal motility.Vallecular residuals. Laryngeal penetration  . Anemia   . Diabetes mellitus     with gastroparesis 70% retention at 2 hours  . Cancer ([place])   . Lung cancer ([place])   . CHF (congestive heart failure) ([place])   . Myocardial infarction ([place])   . AAA (abdominal aortic aneurysm) without rupture ([place]) 09/25/2013    Past Surgical History  Procedure Laterality Date  . Cholecystectomy    . Cataract surg    . Bladder surgery      tack  . Kidney stone surgery    . Coronary stent placement  2004    2  . Coronary stent placement  2008    2  . S/p hysterectomy    . Neck surgery    . Esophagogastroduodenoscopy  5/08    normal esophagus, No H.Pylori  . Egd/bravo  07/2008    On Nexium BID. Day 1 DMSTR Score: 0.7,  day 2: DMSTR score: 32, uncontrolled GERD, No H. Pylori  . Hbt  2009    negative  . Colonoscopy  05/2010    tortuous sigm colon, sigm tics, multiple simple adenomas, hemorrhoids, next TCS 10-15 yrs per Dr. Oneida Alar. Previous h/o tubular adenomas in 2008.   Marland Kitchen Esophagogastroduodenoscopy  05/2010    small hh, streaky erythema in body/antrum. Duodenal bx negative.   . Esophagogastroduodenoscopy  06/22/2011    mild gastritis/ring in the distal esophagus, savary dilation. Bx reactive gastropathy, No H.pylori  . Savory dilation  06/22/2011    Procedure: SAVORY DILATION;  Surgeon: Dorothyann Peng, MD;  [facility];  Service: Endoscopy;  Laterality: N/A;  [name] dilation  06/22/2011    Procedure: [name] DILATION;  Surgeon: Dorothyann Peng, MD;  [facility]: [facility];   Service: Endoscopy;  Laterality: N/A;  . Video brochoscopy with electromagnetic navigaton  05/10/2011    [name]  . Right lower lobe superior segmentectomy  October 2012  . Hysterectomy at age 72    . Cardiac catheterization    . Abdominal hysterectomy    . Esophagogastroduodenoscopy N/A 08/14/2013    Procedure: ESOPHAGOGASTRODUODENOSCOPY (EGD);  Surgeon: Danie Binder, MD;  [facility];  Service: Endoscopy;  Laterality: N/A;  2:45  . Colonoscopy N/A 11/02/2013    Procedure: COLONOSCOPY;  Surgeon: Danie Binder, MD;  [facility];  Service: Endoscopy;  Laterality: N/A;  1:15  . Hemorrhoid banding N/A 11/02/2013    Procedure: HEMORRHOID BANDING;  Surgeon: Danie Binder, MD;  [facility];  Service: Endoscopy;  Laterality: N/A;  . Agile capsule N/A 11/12/2013    Procedure: AGILE CAPSULE;  Surgeon: Danie Binder, MD;  [facility];  Service: Endoscopy;  Laterality: N/A;  8:00  . Bacterial overgrowth test N/A 11/16/2013    Procedure: BACTERIAL OVERGROWTH TEST;  Surgeon: Danie Binder, MD;  [facility];  Service: Endoscopy;  Laterality: N/A;  8:00  . Wide local excision of skin lesion with complex reconstruction Left 04/26/2014    DR TAT  . Mass excision N/A 04/26/2014    Procedure: WIDE LOCAL EXCISION OF NECK SKIN LESION/IMMEDIATE SUBCUTANEOUS TISSUE WITH LOCAL RECONSTRUCTION;  Surgeon: Jerrell Belfast, MD;  Location: [facility] OR;  Service: ENT;  Laterality: N/A;    Current Outpatient Prescriptions  Medication Sig Dispense Refill  . albuterol (PROAIR HFA) 108 (90 BASE) MCG/ACT inhaler Inhale 2 puffs into the lungs every 6 (six) hours as needed for wheezing.     Marland Kitchen albuterol (PROVENTIL) (2.5 MG/3ML) 0.083% nebulizer solution Take 2.5 mg by nebulization every 4 (four) hours as needed for wheezing or shortness of breath.    . ALPRAZolam (XANAX) 1 MG tablet Take 0.5-1 mg by mouth 2 (two) times daily. Take 0.5 mg in am and 1 mg in pm      . amLODipine (NORVASC) 5 MG tablet Take 1 tablet (5 mg total) by mouth daily. 30 tablet 5  . atorvastatin (LIPITOR) 20 MG tablet Take 20 mg by mouth daily.     Marland Kitchen azithromycin (ZITHROMAX) 500 MG tablet Take 1 tablet (500 mg total) by mouth daily. 3 tablet 0  . budesonide-formoterol (SYMBICORT) 160-4.5 MCG/ACT inhaler Inhale 2 puffs into the lungs 2 (two) times daily. 1 Inhaler 0  . clopidogrel (PLAVIX) 75 MG tablet Take 1 tablet (75 mg total) by mouth daily. 30 tablet 5  . cyclobenzaprine (FLEXERIL) 5 MG tablet Take 5 mg by mouth  every 6 (six) hours as needed.    Marland Kitchen dexamethasone (DECADRON) 4 MG tablet Take 4 mg by mouth daily.    . DULoxetine (CYMBALTA) 60 MG capsule Take 60 mg by mouth daily.    Marland Kitchen gabapentin (NEURONTIN) 100 MG capsule Take 100 mg by mouth. TAKES 100 MG IN THE AM AND 200 MG AT NIGHT    . JANUVIA 50 MG tablet Take 50 mg by mouth daily.     Marland Kitchen levothyroxine (SYNTHROID, LEVOTHROID) 125 MCG tablet Take 125 mcg by mouth daily before breakfast.    . lisinopril (PRINIVIL,ZESTRIL) 20 MG tablet Take 20 mg by mouth daily.    . nitroGLYCERIN (NITROSTAT) 0.4 MG SL tablet Place 0.4 mg under the tongue every 5 (five) minutes as needed for chest pain.    . pantoprazole (PROTONIX) 40 MG tablet Take 1 tablet (40 mg total) by mouth 2 (two) times daily. 60 tablet 11  . polyethylene glycol (MIRALAX / GLYCOLAX) packet Take 17 g by mouth daily. 14 each 0  . predniSONE (DELTASONE) 10 MG tablet Take 6 tablets (60 mg total) by mouth daily with breakfast. Start 04/16/15 and decrease by 1 tablet daily 21 tablet 0  . ranitidine (ZANTAC) 150 MG tablet Take 150 mg by mouth daily.     No current facility-administered medications for this visit.    Allergies  Allergen Reactions  . Linzess [Linaclotide] Other (See Comments)    EXPLOSIVE DIARRHEA  . Oxycodone Other (See Comments)    Causes hyperactivity   . Sulfonamide Derivatives Swelling and Rash    Hands & feet swell  . Hydrocodone Other (See Comments)     Causes Hyperactivity     Social History   Social History  . Marital Status: Widowed    Spouse Name: N/A  . Number of Children: N/A  . Years of Education: N/A   Occupational History  . Not on file.   Social History Main Topics  . Smoking status: Current Every Day Smoker -- 0.50 packs/day for 40 years    Types: Cigarettes  . Smokeless tobacco: Never Used     Comment: She smokes 3 cigarettes daily  . Alcohol Use: No  . Drug Use: No  . Sexual Activity: No   Other Topics Concern  . Not on file   Social History Narrative    Family History  Problem Relation Age of Onset  . Coronary artery disease      family hx of  . Colon cancer Sister 54  . Colon cancer Daughter   . Prostate cancer      family hx of  . Lung cancer      family hx of  . Ovarian cancer      family hx of  . Arthritis      family hx of  . Other      family hx of chronic respiratory condition  . Heart disease Mother   . Stroke Mother     Review of Systems:  As stated in the HPI and otherwise negative.   BP 107/62 mmHg  Pulse 57  Ht '5\' 8"'$  (1.727 m)  Wt 179 lb 1.9 oz (81.248 kg)  BMI 27.24 kg/m2  SpO2 97%  Physical Examination: General: Well developed, well nourished, NAD HEENT: OP clear, mucus membranes moist SKIN: warm, dry. No rashes. Neuro: No focal deficits Musculoskeletal: Muscle strength 5/5 all ext Psychiatric: Mood and affect normal Neck: No JVD, no carotid bruits, no thyromegaly, no lymphadenopathy. Lungs:Clear bilaterally, no wheezes, rhonci,  crackles Cardiovascular: Regular rate and rhythm. No murmurs, gallops or rubs. Abdomen:Soft. Bowel sounds present. Non-tender.  Extremities: No lower extremity edema. Pulses are 2 + in the bilateral DP/PT.  Lexiscan Myoview May 2015: Stress ECG: No significant change from baseline ECG QPS Raw Data Images: Normal; no motion artifact; normal heart/lung ratio. Stress Images: There is decreased uptake in the anterior wall. Rest Images:  There is decreased uptake in the anterior wall. Subtraction (SDS): Mostly fixed anteroseptal and apical defect Impression Exercise Capacity: Lexiscan with no exercise. BP Response: Normal blood pressure response. Clinical Symptoms: No significant symptoms noted. ECG Impression: No significant ST segment change suggestive of ischemia. Comparison with Prior Nuclear Study: No previous nuclear study performed  Overall Impression: Intermediate risk stress nuclear study with a mostly fixed anteroseptal and apical fixed defect, consistent with possible scar or rate-related LBBB artifact.  LV Wall Motion: NL LV Function; NL Wall Motion; LVEF 53%.  EKG:  EKG is not ordered today. The ekg ordered today demonstrates  Recent Labs: 04/14/2015: ALT 9*; Hemoglobin 12.0; Platelets 206 04/15/2015: BUN 17; Creatinine, Ser 1.09*; Potassium 3.8; Sodium 140   Lipid Panel followed in primary care  Wt Readings from Last 3 Encounters:  08/29/15 179 lb 1.9 oz (81.248 kg)  04/14/15 190 lb (86.183 kg)  02/19/15 193 lb (87.544 kg)     Other studies Reviewed: Additional studies/ records that were reviewed today include. Review of the above records demonstrates:   Assessment and Plan:   1. CAD: Stable. No ischemia on stress myoview May 2015. Most recent cath was in August 2012 and her disease was stable. Will continue Plavix.   2. Tobacco abuse: She is smoking 5-10 cigarettes per day and has no desire to scutting back. Smoking cessation encouraged. She wishes to stop.   3. AAA: Stable. Followed by Dr. Donnetta Hutching in [place]. She will need to arrange f/u with Dr. Donnetta Hutching.   4. HTN: BP controlled. No changes.   5. HLD: on statin. Lipids followed in primary care but she reports no recent labs so will check CMET and lipids today.   Current medicines are reviewed at length with the patient today.  The patient does not have concerns regarding medicines.  The following changes have been made:  no change  Labs/  tests ordered today include: No orders of the defined types were placed in this encounter.   Disposition:   FU with me in 6 months  Signed, Lauree Chandler, MD 08/29/2015 9:35 AM    [facility] [address], [place], [place]  [address] Phone: [phone]; Fax: [phone]

## 2015-12-03 IMAGING — CR DG ABDOMEN 1V
2 series · 2 of 2 positions shown · non-contrast
Comparison: None.

ADDENDUM:
Endoscopy capsule is noted in the right lower quadrant, likely in
the distal small bowel.
CLINICAL DATA: Abdominal pain.

EXAM:
ABDOMEN - 1 VIEW

[view not recorded (1 of 2)]
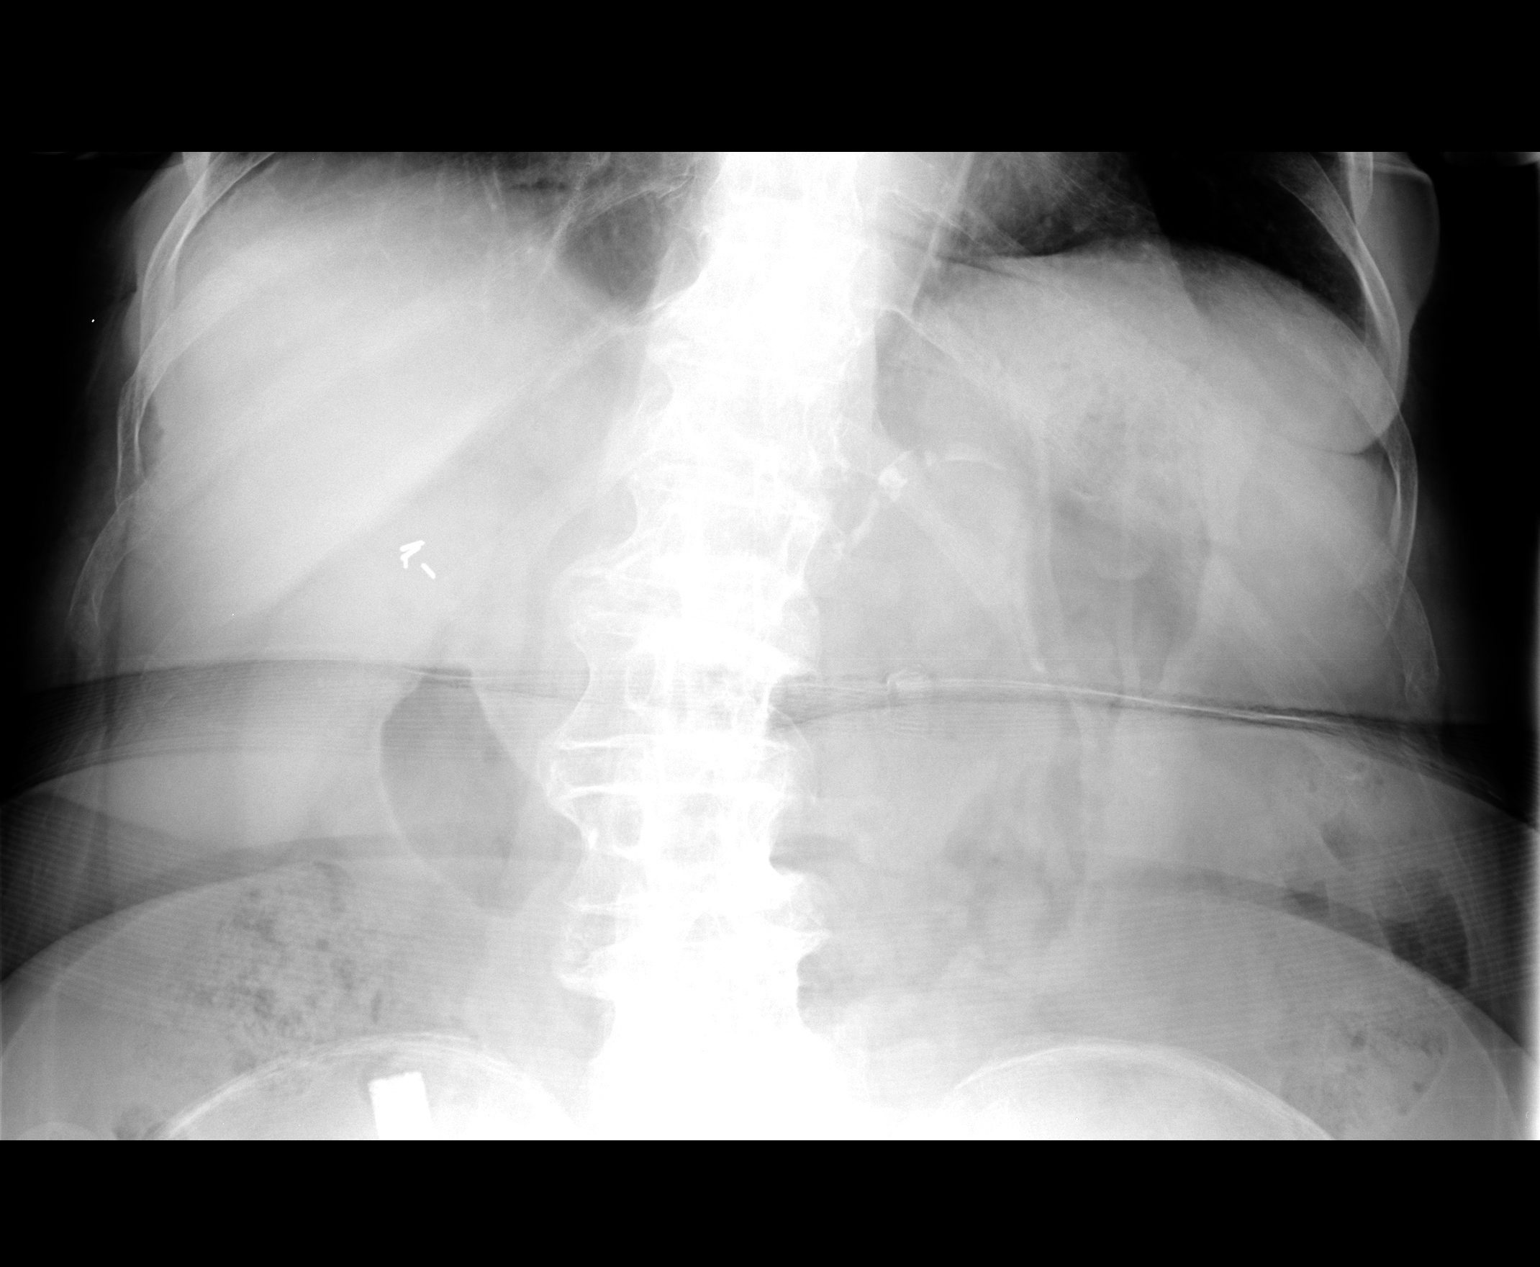

[view not recorded (2 of 2)]
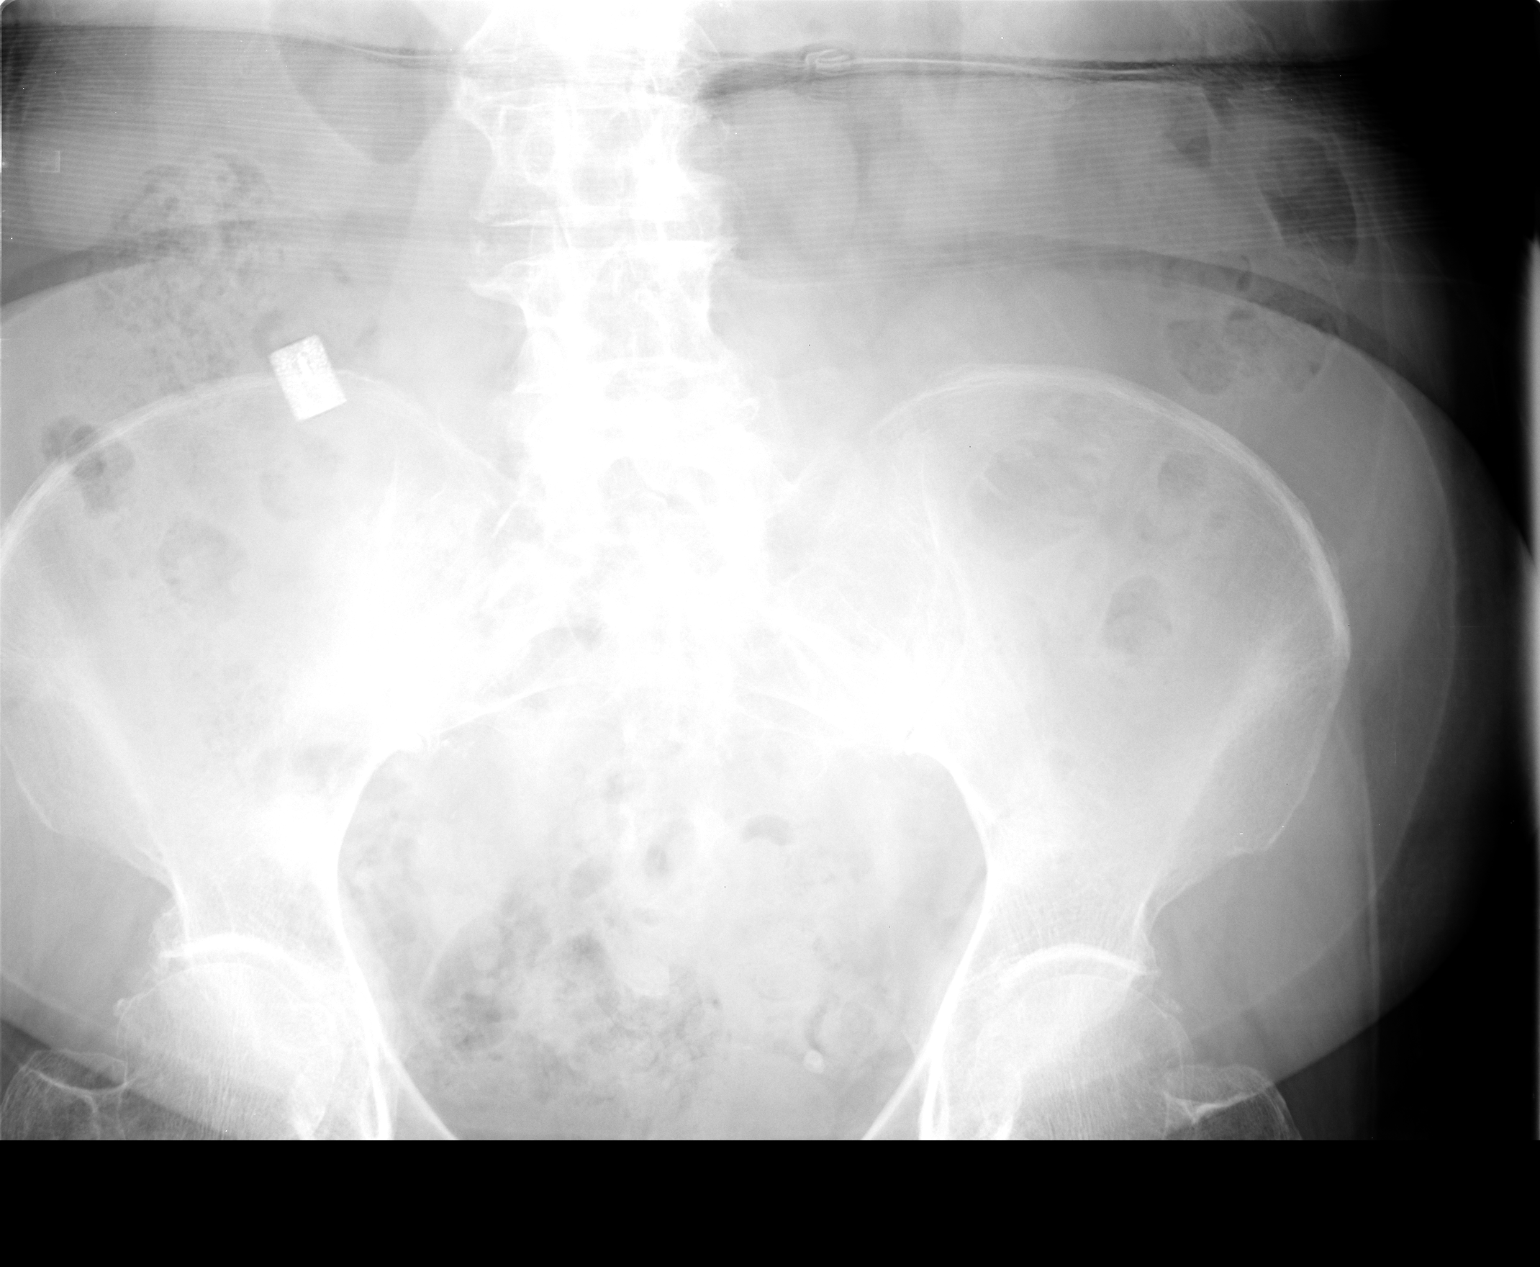

[2 of 2 positions shown; findings below may reference images not displayed]

FINDINGS: Air is seen within nondilated loops of large small bowel. A moderate
amount of stool is appreciated within the colon. A rectangular
shaped metallic appearing density projects in the right lower
quadrant. This may represent an external artifact. Atherosclerotic
calcification ill-defined within the aorta and mesenteric vessels.
Dextroscoliosis is appreciated within the lumbar spine.
IMPRESSION: Nonobstructive bowel gas pattern with moderate amount of stool.
Metallic density projecting within the right lower quadrant is may
represent an external artifact. Finding is not appreciated on a
prior CT of the abdomen pelvis dated 07/09/2013.

## 2016-02-19 ENCOUNTER — Encounter: Payer: Self-pay | Admitting: Cardiovascular Disease

## 2016-02-19 ENCOUNTER — Ambulatory Visit (INDEPENDENT_AMBULATORY_CARE_PROVIDER_SITE_OTHER): Payer: Medicare Other | Admitting: Cardiovascular Disease

## 2016-02-19 VITALS — BP 122/68 | HR 71 | Ht 69.0 in | Wt 178.2 lb

## 2016-02-19 DIAGNOSIS — I714 Abdominal aortic aneurysm, without rupture, unspecified: Secondary | ICD-10-CM

## 2016-02-19 DIAGNOSIS — I2510 Atherosclerotic heart disease of native coronary artery without angina pectoris: Secondary | ICD-10-CM

## 2016-02-19 DIAGNOSIS — I10 Essential (primary) hypertension: Secondary | ICD-10-CM | POA: Diagnosis not present

## 2016-02-19 DIAGNOSIS — Z720 Tobacco use: Secondary | ICD-10-CM

## 2016-02-19 DIAGNOSIS — E785 Hyperlipidemia, unspecified: Secondary | ICD-10-CM

## 2016-02-19 NOTE — Patient Instructions (Signed)

## 2016-02-19 NOTE — Progress Notes (Signed)
Chief Complaint  Patient presents with  . Follow-up    no complaints  . Coronary Artery Disease    History of Present Illness: 82 yo WF with history of CAD, HTN, Hyperlipidemia, DM, GERD, anxiety/depression here today for cardiac follow up. 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 A new DES was also placed at that time. She previously had a Cypher stent in the circumflex artery as well. Event monitor several years ago with isolated PVCs and APCs. She has chronic shortness of breath related to her COPD. She had an MRI in June 2011 which showed a 3.6 x 3.6 cm infrarenal aortic aneurysm. She has chronic back pain felt to be secondary lumbar disc bulging but not felt to be a surgical candidate. She also has esophageal strictures and had an esophageal dilatation in 2012. Last cardiac cath 07/09/11 which showed mild to moderate stable CAD. She had a right lower lobectomy for stage IA non-small cell lung cancer per Dr. Arlyce Dice in October 2012. She has been on Plavix chronically with first generation DES. Neck mass found in 2015 and has been resected, felt to be benign. Stress myoview May 2015 with no ischemia.  Kidney mass but she was not told this sure if this is cancer.   She is here today for follow up. She denies any chest pain or change in her breathing. She has continued to smoke 5-10 cigarettes per day.    Primary Care Physician: Ricard Dillon, NP  Last Lipid Profile: Per primary care  Past Medical History  Diagnosis Date  . Coronary artery disease     post diaphragmatic wall infarction on 06-2007,due to stent thrombosis   . Chronic obstructive pulmonary disease ([place])   . Tobacco abuse   . Hypertension   . Hyperlipidemia   . GERD (gastroesophageal reflux disease)   . Anxiety disorder   . Depression   . Hypothyroidism   . Hx of hysterectomy   . AAA (abdominal aortic aneurysm) ([place])   . Pulmonary nodule 04/2011   neg bx, followed by Dr. Arlyce Dice, may still be cancer, being followed with CTs.  . Esophageal dysmotility     Two BPE since 03/2011-->Moderate impairment of esophageal motility.Vallecular residuals. Laryngeal penetration  . Anemia   . Diabetes mellitus     with gastroparesis 70% retention at 2 hours  . Cancer ([place])   . Lung cancer ([place])   . CHF (congestive heart failure) ([place])   . Myocardial infarction ([place])   . AAA (abdominal aortic aneurysm) without rupture ([place]) 09/25/2013    Past Surgical History  Procedure Laterality Date  . Cholecystectomy    . Cataract surg    . Bladder surgery      tack  . Kidney stone surgery    . Coronary stent placement  2004    2  . Coronary stent placement  2008    2  . S/p hysterectomy    . Neck surgery    . Esophagogastroduodenoscopy  5/08    normal esophagus, No H.Pylori  . Egd/bravo  07/2008    On Nexium BID. Day 1 DMSTR Score: 0.7, day 2: DMSTR score: 32, uncontrolled GERD, No H. Pylori  . Hbt  2009    negative  . Colonoscopy  05/2010    tortuous sigm colon, sigm tics, multiple simple adenomas, hemorrhoids, next TCS 10-15 yrs per Dr. Oneida Alar. Previous h/o tubular adenomas in 2008.   Marland Kitchen Esophagogastroduodenoscopy  05/2010    small hh, streaky erythema in body/antrum. Duodenal bx negative.   . Esophagogastroduodenoscopy  06/22/2011    mild gastritis/ring in the distal esophagus, savary dilation. Bx reactive gastropathy, No H.pylori  . Savory dilation  06/22/2011    Procedure: SAVORY DILATION;  Surgeon: Dorothyann Peng, MD;  [facility];  Service: Endoscopy;  Laterality: N/A;  [name] dilation  06/22/2011    Procedure: [name] DILATION;  Surgeon: Dorothyann Peng, MD;  [facility];  Service: Endoscopy;  Laterality: N/A;  . Video brochoscopy with electromagnetic navigaton  05/10/2011    [name]  . Right lower lobe superior segmentectomy  October 2012  . Hysterectomy at age 55    . Cardiac catheterization    . Abdominal  hysterectomy    . Esophagogastroduodenoscopy N/A 08/14/2013    Procedure: ESOPHAGOGASTRODUODENOSCOPY (EGD);  Surgeon: Danie Binder, MD;  [facility];  Service: Endoscopy;  Laterality: N/A;  2:45  . Colonoscopy N/A 11/02/2013    Procedure: COLONOSCOPY;  Surgeon: Danie Binder, MD;  [facility];  Service: Endoscopy;  Laterality: N/A;  1:15  . Hemorrhoid banding N/A 11/02/2013    Procedure: HEMORRHOID BANDING;  Surgeon: Danie Binder, MD;  [facility];  Service: Endoscopy;  Laterality: N/A;  . Agile capsule N/A 11/12/2013    Procedure: AGILE CAPSULE;  Surgeon: Danie Binder, MD;  [facility];  Service: Endoscopy;  Laterality: N/A;  8:00  . Bacterial overgrowth test N/A 11/16/2013    Procedure: BACTERIAL OVERGROWTH TEST;  Surgeon: Danie Binder, MD;  [facility];  Service: Endoscopy;  Laterality: N/A;  8:00  . Wide local excision of skin lesion with complex reconstruction Left 04/26/2014    DR TAT  . Mass excision N/A 04/26/2014    Procedure: WIDE LOCAL EXCISION OF NECK SKIN LESION/IMMEDIATE SUBCUTANEOUS TISSUE WITH LOCAL RECONSTRUCTION;  Surgeon: Jerrell Belfast, MD;  Location[facility];  Service: ENT;  Laterality: N/A;    Current Outpatient Prescriptions  Medication Sig Dispense Refill  . albuterol (PROAIR HFA) 108 (90 BASE) MCG/ACT inhaler Inhale 2 puffs into the lungs every 6 (six) hours as needed for wheezing.     Marland Kitchen albuterol (PROVENTIL) (2.5 MG/3ML) 0.083% nebulizer solution Take 2.5 mg by nebulization every 4 (four) hours as needed for wheezing or shortness of breath.    . ALPRAZolam (XANAX) 1 MG tablet Take 0.5-1 mg by mouth 2 (two) times daily. Take 0.5 mg in am and 1 mg in pm    . amLODipine (NORVASC) 5 MG tablet Take 1 tablet (5 mg total) by mouth daily. 30 tablet 5  . atorvastatin (LIPITOR) 20 MG tablet Take 20 mg by mouth daily.     . clopidogrel (PLAVIX) 75 MG tablet Take 1 tablet (75 mg total) by mouth daily. 30 tablet 5    . DULoxetine (CYMBALTA) 60 MG capsule Take 60 mg by mouth daily.    . fluticasone furoate-vilanterol (BREO ELLIPTA) 100-25 MCG/INH AEPB Inhale 1 puff into the lungs daily.    Marland Kitchen gabapentin (NEURONTIN) 100 MG capsule Take 100 mg by mouth. TAKES 100 MG IN THE AM AND 200 MG AT NIGHT    . JANUVIA 50 MG tablet Take 50 mg by mouth daily.     Marland Kitchen levothyroxine (SYNTHROID, LEVOTHROID) 125 MCG tablet Take 125 mcg by mouth daily before breakfast.    . lisinopril (PRINIVIL,ZESTRIL) 20 MG tablet Take 20 mg by mouth daily.    Marland Kitchen  nitroGLYCERIN (NITROSTAT) 0.4 MG SL tablet Place 0.4 mg under the tongue every 5 (five) minutes as needed for chest pain.    . pantoprazole (PROTONIX) 40 MG tablet Take 1 tablet (40 mg total) by mouth 2 (two) times daily. 60 tablet 11  . polyethylene glycol (MIRALAX / GLYCOLAX) packet Take 17 g by mouth daily. 14 each 0  . ranitidine (ZANTAC) 150 MG tablet Take 150 mg by mouth daily.     No current facility-administered medications for this visit.    Allergies  Allergen Reactions  . Linzess [Linaclotide] Other (See Comments)    EXPLOSIVE DIARRHEA  . Oxycodone Other (See Comments)    Causes hyperactivity   . Sulfonamide Derivatives Swelling and Rash    Hands & feet swell  . Hydrocodone Other (See Comments)    Causes Hyperactivity     Social History   Social History  . Marital Status: Widowed    Spouse Name: N/A  . Number of Children: N/A  . Years of Education: N/A   Occupational History  . Not on file.   Social History Main Topics  . Smoking status: Current Every Day Smoker -- 0.50 packs/day for 40 years    Types: Cigarettes  . Smokeless tobacco: Never Used     Comment: She smokes 3 cigarettes daily  . Alcohol Use: No  . Drug Use: No  . Sexual Activity: No   Other Topics Concern  . Not on file   Social History Narrative    Family History  Problem Relation Age of Onset  . Coronary artery disease      family hx of  . Colon cancer Sister 50  . Colon  cancer Daughter   . Prostate cancer      family hx of  . Lung cancer      family hx of  . Ovarian cancer      family hx of  . Arthritis      family hx of  . Other      family hx of chronic respiratory condition  . Heart disease Mother   . Stroke Mother     Review of Systems:  As stated in the HPI and otherwise negative.   BP 122/68 mmHg  Pulse 71  Ht '5\' 9"'$  (1.753 m)  Wt 178 lb 3.2 oz (80.831 kg)  BMI 26.30 kg/m2  SpO2 97%  Physical Examination: General: Well developed, well nourished, NAD HEENT: OP clear, mucus membranes moist SKIN: warm, dry. No rashes. Neuro: No focal deficits Musculoskeletal: Muscle strength 5/5 all ext Psychiatric: Mood and affect normal Neck: No JVD, no carotid bruits, no thyromegaly, no lymphadenopathy. Lungs:Clear bilaterally, no wheezes, rhonci, crackles Cardiovascular: Regular rate and rhythm. No murmurs, gallops or rubs. Abdomen:Soft. Bowel sounds present. Non-tender.  Extremities: No lower extremity edema. Pulses are 2 + in the bilateral DP/PT.  Lexiscan Myoview May 2015: Stress ECG: No significant change from baseline ECG QPS Raw Data Images: Normal; no motion artifact; normal heart/lung ratio. Stress Images: There is decreased uptake in the anterior wall. Rest Images: There is decreased uptake in the anterior wall. Subtraction (SDS): Mostly fixed anteroseptal and apical defect Impression Exercise Capacity: Lexiscan with no exercise. BP Response: Normal blood pressure response. Clinical Symptoms: No significant symptoms noted. ECG Impression: No significant ST segment change suggestive of ischemia. Comparison with Prior Nuclear Study: No previous nuclear study performed Overall Impression: Intermediate risk stress nuclear study with a mostly fixed anteroseptal and apical fixed defect, consistent with possible scar or  rate-related LBBB artifact. LV Wall Motion: NL LV Function; NL Wall Motion; LVEF 53%.  EKG:  EKG is ordered  today. The ekg ordered today demonstrates sinus, LBBB.   Recent Labs: 04/14/2015: Hemoglobin 12.0; Platelets 206 08/29/2015: ALT 9; BUN 12; Creat 1.19*; Potassium 4.1; Sodium 140   Lipid Panel Lipid Panel     Component Value Date/Time   CHOL 119* 08/29/2015 0952   TRIG 85 08/29/2015 0952   HDL 36* 08/29/2015 0952   CHOLHDL 3.3 08/29/2015 0952   VLDL 17 08/29/2015 0952   LDLCALC 66 08/29/2015 0952     Wt Readings from Last 3 Encounters:  02/19/16 178 lb 3.2 oz (80.831 kg)  08/29/15 179 lb 1.9 oz (81.248 kg)  04/14/15 190 lb (86.183 kg)     Other studies Reviewed: Additional studies/ records that were reviewed today include. Review of the above records demonstrates:   Assessment and Plan:   1. CAD: Stable. No ischemia on stress myoview May 2015. Most recent cath was in August 2012 and her disease was stable. Will continue Plavix and statin.   2. Tobacco abuse: She is smoking 5-10 cigarettes per day and has no desire to stop. Smoking cessation encouraged.   3. AAA: Stable. Followed by Dr. Donnetta Hutching in [facility] but no appt there in 2.5 years. Will repeat abd u/s now. Last in May 2016 with slightly increased size of AAA. She will need to arrange f/u with Dr. Donnetta Hutching.   4. HTN: BP controlled. No changes.   5. HLD: on statin. LDL at goal October 2016.   Current medicines are reviewed at length with the patient today.  The patient does not have concerns regarding medicines.  The following changes have been made:  no change  Labs/ tests ordered today include:  Orders Placed This Encounter  Procedures  . EKG 12-Lead   Disposition:   FU with me in 6 months  Signed, Lauree Chandler, MD 02/19/2016 11:59 AM    [facility] [place], [place], [place]  [address] Phone: [phone]; Fax: [phone]

## 2016-09-20 ENCOUNTER — Encounter: Payer: Self-pay | Admitting: Gastroenterology

## 2016-10-25 ENCOUNTER — Ambulatory Visit (INDEPENDENT_AMBULATORY_CARE_PROVIDER_SITE_OTHER): Payer: Medicare Other | Admitting: Cardiovascular Disease

## 2016-10-25 VITALS — BP 136/70 | HR 84 | Ht 68.0 in | Wt 191.4 lb

## 2016-10-25 DIAGNOSIS — I714 Abdominal aortic aneurysm, without rupture, unspecified: Secondary | ICD-10-CM

## 2016-10-25 DIAGNOSIS — E7800 Pure hypercholesterolemia, unspecified: Secondary | ICD-10-CM

## 2016-10-25 DIAGNOSIS — I2510 Atherosclerotic heart disease of native coronary artery without angina pectoris: Secondary | ICD-10-CM | POA: Diagnosis not present

## 2016-10-25 DIAGNOSIS — I10 Essential (primary) hypertension: Secondary | ICD-10-CM

## 2016-10-25 DIAGNOSIS — Z720 Tobacco use: Secondary | ICD-10-CM

## 2016-10-25 NOTE — Progress Notes (Signed)
Chief Complaint  Patient presents with  . Coronary Artery Disease    History of Present Illness: 82 yo WF with history of CAD, HTN, Hyperlipidemia, DM, GERD, COPD, lung cancer here today for cardiac follow up. She had an inferior MI in 2008 due to stent thrombosis of a previously placed Cypher stent in the RCA. A new DES was also placed in the RCA at that time. She previously had a Cypher stent in the circumflex artery as well. Event monitor several years ago with isolated PVCs and APCs. She has chronic shortness of breath related to her COPD. She had an MRI in June 2011 which showed a 3.6 x 3.6 cm infrarenal aortic aneurysm. She also has esophageal strictures and had an esophageal dilatation in 2012. Last cardiac cath 07/09/11 which showed mild to moderate stable CAD. She had a right lower lobectomy for stage IA non-small cell lung cancer per Dr. Arlyce Dice in October 2012. She has been on Plavix chronically with first generation DES. Stress myoview May 2015 with no ischemia. She was [facility] to [facility] 2017 with pneumonia. She was diagnosed with a PE and was started on Eliquis. Her Plavix was stopped.   She is here today for follow up. She denies any chest pain or change in her breathing. She has continued to smoke 5-10 cigarettes per day.  She feels poorly overall.   Primary Care Physician: Ricard Dillon, NP  Past Medical History:  Diagnosis Date  . AAA (abdominal aortic aneurysm) ([name])   . AAA (abdominal aortic aneurysm) without rupture ([place]) 09/25/2013  . Anemia   . Anxiety disorder   . Cancer ([place])   . CHF (congestive heart failure) ([place])   . Chronic obstructive pulmonary disease ([place])   . Coronary artery disease    post diaphragmatic wall infarction on 06-2007,due to stent thrombosis   . Depression   . Diabetes mellitus    with gastroparesis 70% retention at 2 hours  . Esophageal dysmotility    Two BPE since 03/2011-->Moderate impairment of esophageal  motility.Vallecular residuals. Laryngeal penetration  . GERD (gastroesophageal reflux disease)   . Hx of hysterectomy   . Hyperlipidemia   . Hypertension   . Hypothyroidism   . Lung cancer ([place])   . Myocardial infarction   . Pulmonary nodule 04/2011   neg bx, followed by Dr. Arlyce Dice, may still be cancer, being followed with CTs.  . Tobacco abuse     Past Surgical History:  Procedure Laterality Date  . ABDOMINAL HYSTERECTOMY    . AGILE CAPSULE N/A 11/12/2013   Procedure: AGILE CAPSULE;  Surgeon: Danie Binder, MD;  [facility];  Service: Endoscopy;  Laterality: N/A;  8:00  . BACTERIAL OVERGROWTH TEST N/A 11/16/2013   Procedure: BACTERIAL OVERGROWTH TEST;  Surgeon: Danie Binder, MD;  [facility];  Service: Endoscopy;  Laterality: N/A;  8:00  . BLADDER SURGERY     tack  . CARDIAC CATHETERIZATION    . cataract surg    . CHOLECYSTECTOMY    . COLONOSCOPY  05/2010   tortuous sigm colon, sigm tics, multiple simple adenomas, hemorrhoids, next TCS 10-15 yrs per Dr. Oneida Alar. Previous h/o tubular adenomas in 2008.   Marland Kitchen COLONOSCOPY N/A 11/02/2013   Procedure: COLONOSCOPY;  Surgeon: Danie Binder, MD;  [facility];  Service: Endoscopy;  Laterality: N/A;  1:15  . CORONARY STENT PLACEMENT  2004   2  . CORONARY STENT PLACEMENT  2008   2  .  egd/bravo  07/2008   On Nexium BID. Day 1 DMSTR Score: 0.7, day 2: DMSTR score: 32, uncontrolled GERD, No H. Pylori  . ESOPHAGOGASTRODUODENOSCOPY  5/08   normal esophagus, No H.Pylori  . ESOPHAGOGASTRODUODENOSCOPY  05/2010   small hh, streaky erythema in body/antrum. Duodenal bx negative.   . ESOPHAGOGASTRODUODENOSCOPY  06/22/2011   mild gastritis/ring in the distal esophagus, savary dilation. Bx reactive gastropathy, No H.pylori  . ESOPHAGOGASTRODUODENOSCOPY N/A 08/14/2013   Procedure: ESOPHAGOGASTRODUODENOSCOPY (EGD);  Surgeon: Danie Binder, MD;  [facility];  Service: Endoscopy;  Laterality: N/A;  2:45  . HBT   2009   negative  . HEMORRHOID BANDING N/A 11/02/2013   Procedure: HEMORRHOID BANDING;  Surgeon: Danie Binder, MD;  [facility];  Service: Endoscopy;  Laterality: N/A;  . HYSTERECTOMY AT AGE 37    . KIDNEY STONE SURGERY    . MALONEY DILATION  06/22/2011   Procedure: [name] DILATION;  Surgeon: Dorothyann Peng, MD;  [facility];  Service: Endoscopy;  Laterality: N/A;  . MASS EXCISION N/A 04/26/2014   Procedure: WIDE LOCAL EXCISION OF NECK SKIN LESION/IMMEDIATE SUBCUTANEOUS TISSUE WITH LOCAL RECONSTRUCTION;  Surgeon: Jerrell Belfast, MD;  Location: [facility];  Service: ENT;  Laterality: N/A;  . NECK SURGERY    . Right lower lobe superior segmentectomy  October 2012  . s/p hysterectomy    . SAVORY DILATION  06/22/2011   Procedure: SAVORY DILATION;  Surgeon: Dorothyann Peng, MD;  [facility];  Service: Endoscopy;  Laterality: N/A;  . Video brochoscopy with electromagnetic navigaton  05/10/2011   [name]  . WIDE LOCAL EXCISION OF SKIN LESION WITH COMPLEX RECONSTRUCTION Left 04/26/2014   DR TAT    Current Outpatient Prescriptions  Medication Sig Dispense Refill  . albuterol (PROAIR HFA) 108 (90 BASE) MCG/ACT inhaler Inhale 2 puffs into the lungs every 6 (six) hours as needed for wheezing.     Marland Kitchen albuterol (PROVENTIL) (2.5 MG/3ML) 0.083% nebulizer solution Take 2.5 mg by nebulization every 4 (four) hours as needed for wheezing or shortness of breath.    . ALPRAZolam (XANAX) 1 MG tablet Take 0.5-1 mg by mouth 2 (two) times daily. Take 0.5 mg in am and 1 mg in pm    . amLODipine (NORVASC) 5 MG tablet Take 1 tablet (5 mg total) by mouth daily. 30 tablet 5  . atorvastatin (LIPITOR) 20 MG tablet Take 20 mg by mouth daily.     . DULoxetine (CYMBALTA) 60 MG capsule Take 60 mg by mouth daily.    Marland Kitchen ELIQUIS 5 MG TABS tablet Take 5 mg by mouth 2 (two) times daily.    . fluticasone furoate-vilanterol (BREO ELLIPTA) 100-25 MCG/INH AEPB Inhale 1 puff into the lungs daily.     Marland Kitchen gabapentin (NEURONTIN) 100 MG capsule Take 100-200 mg by mouth as directed. TAKES 100 MG IN THE AM AND 200 MG AT NIGHT    . JANUVIA 50 MG tablet Take 50 mg by mouth daily.     Marland Kitchen levothyroxine (SYNTHROID, LEVOTHROID) 125 MCG tablet Take 125 mcg by mouth daily before breakfast.    . lisinopril (PRINIVIL,ZESTRIL) 20 MG tablet Take 20 mg by mouth daily.    . nitroGLYCERIN (NITROSTAT) 0.4 MG SL tablet Place 0.4 mg under the tongue every 5 (five) minutes as needed for chest pain.    . pantoprazole (PROTONIX) 40 MG tablet Take 1 tablet (40 mg total) by mouth 2 (two) times daily. 60 tablet 11  .  polyethylene glycol (MIRALAX / GLYCOLAX) packet Take 17 g by mouth daily. 14 each 0  . ranitidine (ZANTAC) 150 MG tablet Take 150 mg by mouth daily.     No current facility-administered medications for this visit.     Allergies  Allergen Reactions  . Linzess [Linaclotide] Other (See Comments)    EXPLOSIVE DIARRHEA  . Oxycodone Other (See Comments)    Causes hyperactivity   . Sulfonamide Derivatives Swelling and Rash    Hands & feet swell  . Hydrocodone Other (See Comments)    Causes Hyperactivity     Social History   Social History  . Marital status: Widowed    Spouse name: N/A  . Number of children: N/A  . Years of education: N/A   Occupational History  . Not on file.   Social History Main Topics  . Smoking status: Current Every Day Smoker    Packs/day: 0.50    Years: 40.00    Types: Cigarettes  . Smokeless tobacco: Never Used     Comment: She smokes 3 cigarettes daily  . Alcohol use No  . Drug use: No  . Sexual activity: No   Other Topics Concern  . Not on file   Social History Narrative  . No narrative on file    Family History  Problem Relation Age of Onset  . Coronary artery disease      family hx of  . Colon cancer Sister 96  . Colon cancer Daughter   . Prostate cancer      family hx of  . Lung cancer      family hx of  . Ovarian cancer      family hx of  .  Arthritis      family hx of  . Other      family hx of chronic respiratory condition  . Heart disease Mother   . Stroke Mother     Review of Systems:  As stated in the HPI and otherwise negative.   BP 136/70   Pulse 84   Ht '5\' 8"'$  (1.727 m)   Wt 191 lb 6.4 oz (86.8 kg)   BMI 29.10 kg/m   Physical Examination: General: Well developed, well nourished, NAD  HEENT: OP clear, mucus membranes moist  SKIN: warm, dry. No rashes. Neuro: No focal deficits  Musculoskeletal: Muscle strength 5/5 all ext  Psychiatric: Mood and affect normal  Neck: No JVD, no carotid bruits, no thyromegaly, no lymphadenopathy.  Lungs:Clear bilaterally, no wheezes, rhonci, crackles Cardiovascular: Regular rate and rhythm. No murmurs, gallops or rubs. Abdomen:Soft. Bowel sounds present. Non-tender.  Extremities: No lower extremity edema. Pulses are 2 + in the bilateral DP/PT.  Lexiscan Myoview May 2015: Stress ECG: No significant change from baseline ECG QPS Raw Data Images: Normal; no motion artifact; normal heart/lung ratio. Stress Images: There is decreased uptake in the anterior wall. Rest Images: There is decreased uptake in the anterior wall. Subtraction (SDS): Mostly fixed anteroseptal and apical defect Impression Exercise Capacity: Lexiscan with no exercise. BP Response: Normal blood pressure response. Clinical Symptoms: No significant symptoms noted. ECG Impression: No significant ST segment change suggestive of ischemia. Comparison with Prior Nuclear Study: No previous nuclear study performed Overall Impression: Intermediate risk stress nuclear study with a mostly fixed anteroseptal and apical fixed defect, consistent with possible scar or rate-related LBBB artifact. LV Wall Motion: NL LV Function; NL Wall Motion; LVEF 53%.  EKG:  EKG is not ordered today. The ekg ordered today demonstrates  Recent Labs: No results found for requested labs within last 8760 hours.   Lipid  Panel Lipid Panel     Component Value Date/Time   CHOL 119 (L) 08/29/2015 0952   TRIG 85 08/29/2015 0952   HDL 36 (L) 08/29/2015 0952   CHOLHDL 3.3 08/29/2015 0952   VLDL 17 08/29/2015 0952   LDLCALC 66 08/29/2015 0952     Wt Readings from Last 3 Encounters:  10/25/16 191 lb 6.4 oz (86.8 kg)  02/19/16 178 lb 3.2 oz (80.8 kg)  08/29/15 179 lb 1.9 oz (81.2 kg)     Other studies Reviewed: Additional studies/ records that were reviewed today include. Review of the above records demonstrates:   Assessment and Plan:   1. CAD: Stable. No ischemia on stress myoview May 2015. Most recent cath was in August 2012 and her disease was stable. Will continue statin.   2. Tobacco abuse: She is smoking 5-10 cigarettes per day and has no desire to stop. Smoking cessation encouraged.   3. AAA: No testing since 2014. She missed her appt for f/u u/s after last visit here. This had been followed in [facility]. She will need to arrange f/u with Dr. Donnetta Hutching.   4. HTN: BP controlled. No changes.   5. HLD: on statin. LDL at goal October 2016. Followed in primary care.   Current medicines are reviewed at length with the patient today.  The patient does not have concerns regarding medicines.  The following changes have been made:  no change  Labs/ tests ordered today include:  No orders of the defined types were placed in this encounter.  Disposition:   FU with me in 6 months  Signed, Lauree Chandler, MD 10/25/2016 9:03 AM    [facility] [place], [place], [place]  [address] Phone: [phone]; Fax: [phone]

## 2016-10-25 NOTE — Patient Instructions (Signed)

## 2017-01-14 ENCOUNTER — Ambulatory Visit: Payer: Self-pay | Admitting: Cardiology

## 2017-02-09 ENCOUNTER — Ambulatory Visit: Payer: Self-pay | Admitting: Cardiology

## 2017-03-02 ENCOUNTER — Ambulatory Visit: Payer: Medicare Other | Admitting: Cardiology

## 2018-01-13 DEATH — deceased

## 2020-04-08 ENCOUNTER — Encounter: Payer: Self-pay | Admitting: Gastroenterology
